# Patient Record
Sex: Male | Born: 1940
Health system: Southern US, Community
[De-identification: ages and names within clinical notes are randomized; demographics above are authoritative.]

## PROBLEM LIST (undated history)

## (undated) DIAGNOSIS — J189 Pneumonia, unspecified organism: Secondary | ICD-10-CM

## (undated) DIAGNOSIS — R51 Headache: Secondary | ICD-10-CM

## (undated) DIAGNOSIS — E785 Hyperlipidemia, unspecified: Secondary | ICD-10-CM

## (undated) DIAGNOSIS — G4733 Obstructive sleep apnea (adult) (pediatric): Secondary | ICD-10-CM

## (undated) DIAGNOSIS — F329 Major depressive disorder, single episode, unspecified: Secondary | ICD-10-CM

## (undated) DIAGNOSIS — M545 Low back pain, unspecified: Secondary | ICD-10-CM

## (undated) DIAGNOSIS — K219 Gastro-esophageal reflux disease without esophagitis: Secondary | ICD-10-CM

## (undated) DIAGNOSIS — F419 Anxiety disorder, unspecified: Secondary | ICD-10-CM

## (undated) DIAGNOSIS — K279 Peptic ulcer, site unspecified, unspecified as acute or chronic, without hemorrhage or perforation: Secondary | ICD-10-CM

## (undated) DIAGNOSIS — N401 Enlarged prostate with lower urinary tract symptoms: Secondary | ICD-10-CM

## (undated) DIAGNOSIS — D649 Anemia, unspecified: Secondary | ICD-10-CM

## (undated) DIAGNOSIS — M1990 Unspecified osteoarthritis, unspecified site: Secondary | ICD-10-CM

## (undated) DIAGNOSIS — N529 Male erectile dysfunction, unspecified: Secondary | ICD-10-CM

## (undated) DIAGNOSIS — G9009 Other idiopathic peripheral autonomic neuropathy: Secondary | ICD-10-CM

## (undated) DIAGNOSIS — S7292XA Unspecified fracture of left femur, initial encounter for closed fracture: Secondary | ICD-10-CM

## (undated) DIAGNOSIS — N138 Other obstructive and reflux uropathy: Secondary | ICD-10-CM

## (undated) DIAGNOSIS — Z01810 Encounter for preprocedural cardiovascular examination: Secondary | ICD-10-CM

## (undated) DIAGNOSIS — I219 Acute myocardial infarction, unspecified: Secondary | ICD-10-CM

## (undated) DIAGNOSIS — Z8249 Family history of ischemic heart disease and other diseases of the circulatory system: Secondary | ICD-10-CM

## (undated) DIAGNOSIS — I499 Cardiac arrhythmia, unspecified: Secondary | ICD-10-CM

## (undated) DIAGNOSIS — R001 Bradycardia, unspecified: Secondary | ICD-10-CM

## (undated) DIAGNOSIS — F32A Depression, unspecified: Secondary | ICD-10-CM

## (undated) DIAGNOSIS — R519 Headache, unspecified: Secondary | ICD-10-CM

## (undated) DIAGNOSIS — I672 Cerebral atherosclerosis: Secondary | ICD-10-CM

## (undated) DIAGNOSIS — I2510 Atherosclerotic heart disease of native coronary artery without angina pectoris: Secondary | ICD-10-CM

## (undated) DIAGNOSIS — M48061 Spinal stenosis, lumbar region without neurogenic claudication: Secondary | ICD-10-CM

## (undated) DIAGNOSIS — I10 Essential (primary) hypertension: Secondary | ICD-10-CM

## (undated) DIAGNOSIS — Z0000 Encounter for general adult medical examination without abnormal findings: Secondary | ICD-10-CM

## (undated) DIAGNOSIS — Z5189 Encounter for other specified aftercare: Secondary | ICD-10-CM

## (undated) HISTORY — DX: Anxiety disorder, unspecified: F41.9

## (undated) HISTORY — DX: Family history of ischemic heart disease and other diseases of the circulatory system: Z82.49

## (undated) HISTORY — DX: Low back pain, unspecified: M54.50

## (undated) HISTORY — DX: Cerebral atherosclerosis: I67.2

## (undated) HISTORY — DX: Pneumonia, unspecified organism: J18.9

## (undated) HISTORY — DX: Male erectile dysfunction, unspecified: N52.9

## (undated) HISTORY — DX: Anemia, unspecified: D64.9

## (undated) HISTORY — DX: Atherosclerotic heart disease of native coronary artery without angina pectoris: I25.10

## (undated) HISTORY — DX: Low back pain: M54.5

## (undated) HISTORY — DX: Encounter for preprocedural cardiovascular examination: Z01.810

## (undated) HISTORY — DX: Headache, unspecified: R51.9

## (undated) HISTORY — DX: Other obstructive and reflux uropathy: N13.8

## (undated) HISTORY — DX: Headache: R51

## (undated) HISTORY — DX: Encounter for general adult medical examination without abnormal findings: Z00.00

## (undated) HISTORY — DX: Depression, unspecified: F32.A

## (undated) HISTORY — DX: Encounter for other specified aftercare: Z51.89

## (undated) HISTORY — PX: FRACTURE SURGERY: SHX138

## (undated) HISTORY — DX: Hyperlipidemia, unspecified: E78.5

## (undated) HISTORY — DX: Spinal stenosis, lumbar region without neurogenic claudication: M48.061

## (undated) HISTORY — DX: Acute myocardial infarction, unspecified: I21.9

## (undated) HISTORY — DX: Obstructive sleep apnea (adult) (pediatric): G47.33

## (undated) HISTORY — DX: Peptic ulcer, site unspecified, unspecified as acute or chronic, without hemorrhage or perforation: K27.9

## (undated) HISTORY — DX: Benign prostatic hyperplasia with lower urinary tract symptoms: N40.1

## (undated) HISTORY — DX: Major depressive disorder, single episode, unspecified: F32.9

## (undated) HISTORY — PX: BACK SURGERY: SHX140

## (undated) HISTORY — DX: Other idiopathic peripheral autonomic neuropathy: G90.09

## (undated) HISTORY — DX: Unspecified osteoarthritis, unspecified site: M19.90

## (undated) HISTORY — DX: Essential (primary) hypertension: I10

---

## 1995-02-19 DIAGNOSIS — I219 Acute myocardial infarction, unspecified: Secondary | ICD-10-CM

## 1995-02-19 HISTORY — DX: Acute myocardial infarction, unspecified: I21.9

## 1996-02-19 DIAGNOSIS — I2510 Atherosclerotic heart disease of native coronary artery without angina pectoris: Secondary | ICD-10-CM

## 1996-02-19 HISTORY — PX: CORONARY ANGIOPLASTY WITH STENT PLACEMENT: SHX49

## 1996-02-19 HISTORY — DX: Atherosclerotic heart disease of native coronary artery without angina pectoris: I25.10

## 1998-04-28 ENCOUNTER — Ambulatory Visit (HOSPITAL_COMMUNITY): Admission: RE | Admit: 1998-04-28 | Discharge: 1998-04-28 | Payer: Self-pay | Admitting: Internal Medicine

## 1998-04-28 ENCOUNTER — Encounter: Payer: Self-pay | Admitting: Internal Medicine

## 1998-06-13 ENCOUNTER — Encounter: Payer: Self-pay | Admitting: Neurosurgery

## 1998-06-16 ENCOUNTER — Encounter: Payer: Self-pay | Admitting: Neurosurgery

## 1998-06-16 ENCOUNTER — Ambulatory Visit (HOSPITAL_COMMUNITY): Admission: RE | Admit: 1998-06-16 | Discharge: 1998-06-16 | Payer: Self-pay | Admitting: Neurosurgery

## 1998-08-30 ENCOUNTER — Ambulatory Visit (HOSPITAL_BASED_OUTPATIENT_CLINIC_OR_DEPARTMENT_OTHER): Admission: RE | Admit: 1998-08-30 | Discharge: 1998-08-30 | Payer: Self-pay | Admitting: General Surgery

## 1999-03-22 HISTORY — PX: REVISION TOTAL KNEE ARTHROPLASTY: SUR1280

## 1999-04-03 ENCOUNTER — Inpatient Hospital Stay (HOSPITAL_COMMUNITY): Admission: RE | Admit: 1999-04-03 | Discharge: 1999-04-06 | Payer: Self-pay | Admitting: Orthopedic Surgery

## 1999-04-03 ENCOUNTER — Encounter: Payer: Self-pay | Admitting: Orthopedic Surgery

## 1999-07-05 ENCOUNTER — Ambulatory Visit (HOSPITAL_COMMUNITY): Admission: RE | Admit: 1999-07-05 | Discharge: 1999-07-05 | Payer: Self-pay | Admitting: Neurosurgery

## 1999-08-14 ENCOUNTER — Ambulatory Visit (HOSPITAL_COMMUNITY): Admission: RE | Admit: 1999-08-14 | Discharge: 1999-08-14 | Payer: Self-pay | Admitting: Neurosurgery

## 1999-08-14 ENCOUNTER — Encounter: Payer: Self-pay | Admitting: Neurosurgery

## 1999-08-27 ENCOUNTER — Ambulatory Visit (HOSPITAL_COMMUNITY): Admission: RE | Admit: 1999-08-27 | Discharge: 1999-08-27 | Payer: Self-pay | Admitting: Neurosurgery

## 1999-08-27 ENCOUNTER — Encounter: Payer: Self-pay | Admitting: Neurosurgery

## 1999-09-10 ENCOUNTER — Ambulatory Visit (HOSPITAL_COMMUNITY): Admission: RE | Admit: 1999-09-10 | Discharge: 1999-09-10 | Payer: Self-pay | Admitting: Neurosurgery

## 1999-09-10 ENCOUNTER — Encounter: Payer: Self-pay | Admitting: Neurosurgery

## 2000-12-16 ENCOUNTER — Ambulatory Visit (HOSPITAL_BASED_OUTPATIENT_CLINIC_OR_DEPARTMENT_OTHER): Admission: RE | Admit: 2000-12-16 | Discharge: 2000-12-16 | Payer: Self-pay | Admitting: Otolaryngology

## 2000-12-16 ENCOUNTER — Encounter: Payer: Self-pay | Admitting: Internal Medicine

## 2001-05-11 ENCOUNTER — Encounter: Payer: Self-pay | Admitting: Orthopedic Surgery

## 2001-05-11 ENCOUNTER — Encounter: Admission: RE | Admit: 2001-05-11 | Discharge: 2001-05-11 | Payer: Self-pay | Admitting: Orthopedic Surgery

## 2001-06-18 ENCOUNTER — Encounter: Payer: Self-pay | Admitting: Orthopedic Surgery

## 2001-06-18 ENCOUNTER — Inpatient Hospital Stay (HOSPITAL_COMMUNITY): Admission: RE | Admit: 2001-06-18 | Discharge: 2001-06-19 | Payer: Self-pay | Admitting: Orthopedic Surgery

## 2001-06-18 HISTORY — PX: LUMBAR LAMINECTOMY: SHX95

## 2001-07-01 ENCOUNTER — Emergency Department (HOSPITAL_COMMUNITY): Admission: EM | Admit: 2001-07-01 | Discharge: 2001-07-01 | Payer: Self-pay | Admitting: *Deleted

## 2002-02-18 HISTORY — PX: ESOPHAGOGASTRODUODENOSCOPY: SHX1529

## 2002-02-18 HISTORY — PX: COLONOSCOPY: SHX174

## 2002-08-19 HISTORY — PX: CARDIAC CATHETERIZATION: SHX172

## 2002-09-02 ENCOUNTER — Encounter: Payer: Self-pay | Admitting: Emergency Medicine

## 2002-09-02 ENCOUNTER — Inpatient Hospital Stay (HOSPITAL_COMMUNITY): Admission: EM | Admit: 2002-09-02 | Discharge: 2002-09-04 | Payer: Self-pay | Admitting: Emergency Medicine

## 2002-10-26 ENCOUNTER — Encounter: Payer: Self-pay | Admitting: Gastroenterology

## 2002-10-26 ENCOUNTER — Ambulatory Visit (HOSPITAL_COMMUNITY): Admission: RE | Admit: 2002-10-26 | Discharge: 2002-10-26 | Payer: Self-pay | Admitting: Gastroenterology

## 2003-02-19 DIAGNOSIS — J189 Pneumonia, unspecified organism: Secondary | ICD-10-CM

## 2003-02-19 HISTORY — DX: Pneumonia, unspecified organism: J18.9

## 2003-08-19 HISTORY — PX: CARPAL TUNNEL RELEASE: SHX101

## 2003-08-25 ENCOUNTER — Ambulatory Visit (HOSPITAL_BASED_OUTPATIENT_CLINIC_OR_DEPARTMENT_OTHER): Admission: RE | Admit: 2003-08-25 | Discharge: 2003-08-25 | Payer: Self-pay | Admitting: Orthopedic Surgery

## 2003-12-07 ENCOUNTER — Encounter: Payer: Self-pay | Admitting: Internal Medicine

## 2003-12-20 HISTORY — PX: CARPAL TUNNEL RELEASE: SHX101

## 2003-12-21 ENCOUNTER — Ambulatory Visit (HOSPITAL_COMMUNITY): Admission: RE | Admit: 2003-12-21 | Discharge: 2003-12-21 | Payer: Self-pay | Admitting: Orthopedic Surgery

## 2004-05-28 ENCOUNTER — Ambulatory Visit: Payer: Self-pay | Admitting: Internal Medicine

## 2004-05-31 ENCOUNTER — Ambulatory Visit: Payer: Self-pay | Admitting: Internal Medicine

## 2004-06-06 ENCOUNTER — Ambulatory Visit: Payer: Self-pay | Admitting: Internal Medicine

## 2004-06-18 ENCOUNTER — Ambulatory Visit: Payer: Self-pay | Admitting: Internal Medicine

## 2004-08-28 ENCOUNTER — Ambulatory Visit: Payer: Self-pay | Admitting: Internal Medicine

## 2004-09-03 ENCOUNTER — Ambulatory Visit: Payer: Self-pay | Admitting: Internal Medicine

## 2004-10-21 ENCOUNTER — Emergency Department (HOSPITAL_COMMUNITY): Admission: EM | Admit: 2004-10-21 | Discharge: 2004-10-21 | Payer: Self-pay | Admitting: Family Medicine

## 2004-10-24 ENCOUNTER — Ambulatory Visit: Payer: Self-pay | Admitting: Internal Medicine

## 2004-11-26 ENCOUNTER — Ambulatory Visit: Payer: Self-pay | Admitting: Internal Medicine

## 2004-11-28 ENCOUNTER — Ambulatory Visit: Payer: Self-pay | Admitting: Cardiology

## 2004-11-30 ENCOUNTER — Ambulatory Visit: Payer: Self-pay | Admitting: Internal Medicine

## 2004-12-13 ENCOUNTER — Ambulatory Visit: Payer: Self-pay | Admitting: Internal Medicine

## 2004-12-31 ENCOUNTER — Ambulatory Visit: Payer: Self-pay | Admitting: Internal Medicine

## 2005-02-12 ENCOUNTER — Ambulatory Visit: Payer: Self-pay | Admitting: Internal Medicine

## 2005-04-18 ENCOUNTER — Ambulatory Visit: Payer: Self-pay | Admitting: Internal Medicine

## 2005-05-29 ENCOUNTER — Encounter (INDEPENDENT_AMBULATORY_CARE_PROVIDER_SITE_OTHER): Payer: Self-pay | Admitting: *Deleted

## 2005-05-29 ENCOUNTER — Ambulatory Visit (HOSPITAL_COMMUNITY): Admission: RE | Admit: 2005-05-29 | Discharge: 2005-05-29 | Payer: Self-pay | Admitting: Specialist

## 2005-06-18 HISTORY — PX: KNEE ARTHROSCOPY: SHX127

## 2005-07-04 ENCOUNTER — Ambulatory Visit (HOSPITAL_BASED_OUTPATIENT_CLINIC_OR_DEPARTMENT_OTHER): Admission: RE | Admit: 2005-07-04 | Discharge: 2005-07-04 | Payer: Self-pay | Admitting: Orthopedic Surgery

## 2005-08-10 ENCOUNTER — Encounter: Admission: RE | Admit: 2005-08-10 | Discharge: 2005-08-10 | Payer: Self-pay | Admitting: Family Medicine

## 2005-12-03 ENCOUNTER — Ambulatory Visit: Payer: Self-pay | Admitting: Cardiology

## 2006-01-18 HISTORY — PX: LUMBAR LAMINECTOMY/DECOMPRESSION MICRODISCECTOMY: SHX5026

## 2006-01-28 ENCOUNTER — Encounter: Admission: RE | Admit: 2006-01-28 | Discharge: 2006-01-28 | Payer: Self-pay | Admitting: Orthopedic Surgery

## 2006-02-12 ENCOUNTER — Ambulatory Visit (HOSPITAL_COMMUNITY): Admission: RE | Admit: 2006-02-12 | Discharge: 2006-02-13 | Payer: Self-pay | Admitting: Orthopedic Surgery

## 2006-06-16 ENCOUNTER — Ambulatory Visit: Payer: Self-pay | Admitting: Internal Medicine

## 2006-06-16 LAB — CONVERTED CEMR LAB
PSA, Free Pct: 59 (ref >25)
PSA, Free: 0.2 ng/mL

## 2006-06-17 LAB — CONVERTED CEMR LAB
Basophils Relative: 0.5 % (ref 0.0–1.0)
Eosinophils Relative: 4 % (ref 0.0–5.0)
HCT: 35.9 % — ABNORMAL LOW (ref 39.0–52.0)
Hemoglobin: 12.1 g/dL — ABNORMAL LOW (ref 13.0–17.0)
Lymphocytes Relative: 30.6 % (ref 12.0–46.0)
Monocytes Absolute: 0.3 10*3/uL (ref 0.2–0.7)
Neutro Abs: 3.7 10*3/uL (ref 1.4–7.7)
Neutrophils Relative %: 59.7 % (ref 43.0–77.0)
Platelets: 292 10*3/uL (ref 150–400)
RBC: 4.42 M/uL (ref 4.22–5.81)

## 2006-08-18 ENCOUNTER — Ambulatory Visit: Payer: Self-pay | Admitting: Internal Medicine

## 2006-08-18 LAB — CONVERTED CEMR LAB
Basophils Absolute: 0.1 10*3/uL (ref 0.0–0.1)
Basophils Relative: 0.9 % (ref 0.0–1.0)
HCT: 39.1 % (ref 39.0–52.0)
Monocytes Relative: 7 % (ref 3.0–11.0)
Neutro Abs: 3.3 10*3/uL (ref 1.4–7.7)
Neutrophils Relative %: 55.8 % (ref 43.0–77.0)
Platelets: 208 10*3/uL (ref 150–400)
RDW: 18.2 % — ABNORMAL HIGH (ref 11.5–14.6)
WBC: 6 10*3/uL (ref 4.5–10.5)

## 2006-08-25 ENCOUNTER — Encounter: Payer: Self-pay | Admitting: Internal Medicine

## 2006-08-25 ENCOUNTER — Ambulatory Visit: Payer: Self-pay

## 2006-09-03 DIAGNOSIS — R51 Headache: Secondary | ICD-10-CM

## 2006-09-03 DIAGNOSIS — R519 Headache, unspecified: Secondary | ICD-10-CM | POA: Insufficient documentation

## 2006-09-09 ENCOUNTER — Telehealth: Payer: Self-pay | Admitting: *Deleted

## 2006-09-11 ENCOUNTER — Telehealth: Payer: Self-pay | Admitting: *Deleted

## 2006-09-11 ENCOUNTER — Encounter: Payer: Self-pay | Admitting: Internal Medicine

## 2006-10-13 ENCOUNTER — Telehealth: Payer: Self-pay | Admitting: Internal Medicine

## 2006-11-13 ENCOUNTER — Telehealth: Payer: Self-pay | Admitting: *Deleted

## 2006-11-24 ENCOUNTER — Ambulatory Visit: Payer: Self-pay | Admitting: Internal Medicine

## 2006-11-24 DIAGNOSIS — I10 Essential (primary) hypertension: Secondary | ICD-10-CM | POA: Insufficient documentation

## 2006-11-26 ENCOUNTER — Encounter: Payer: Self-pay | Admitting: Internal Medicine

## 2006-12-15 ENCOUNTER — Telehealth (INDEPENDENT_AMBULATORY_CARE_PROVIDER_SITE_OTHER): Payer: Self-pay | Admitting: *Deleted

## 2006-12-16 ENCOUNTER — Ambulatory Visit: Payer: Self-pay | Admitting: Internal Medicine

## 2006-12-16 DIAGNOSIS — G4733 Obstructive sleep apnea (adult) (pediatric): Secondary | ICD-10-CM

## 2006-12-30 ENCOUNTER — Telehealth: Payer: Self-pay | Admitting: Internal Medicine

## 2007-01-06 ENCOUNTER — Ambulatory Visit: Payer: Self-pay | Admitting: Family Medicine

## 2007-01-06 ENCOUNTER — Telehealth: Payer: Self-pay | Admitting: Internal Medicine

## 2007-02-10 ENCOUNTER — Telehealth: Payer: Self-pay | Admitting: Internal Medicine

## 2007-02-26 ENCOUNTER — Telehealth: Payer: Self-pay | Admitting: Internal Medicine

## 2007-03-05 ENCOUNTER — Encounter: Payer: Self-pay | Admitting: Internal Medicine

## 2007-03-18 ENCOUNTER — Encounter: Payer: Self-pay | Admitting: Internal Medicine

## 2007-03-20 ENCOUNTER — Ambulatory Visit: Payer: Self-pay | Admitting: Internal Medicine

## 2007-03-20 ENCOUNTER — Telehealth: Payer: Self-pay | Admitting: Internal Medicine

## 2007-03-23 ENCOUNTER — Telehealth: Payer: Self-pay | Admitting: Internal Medicine

## 2007-04-01 ENCOUNTER — Encounter: Payer: Self-pay | Admitting: Internal Medicine

## 2007-04-13 ENCOUNTER — Telehealth (INDEPENDENT_AMBULATORY_CARE_PROVIDER_SITE_OTHER): Payer: Self-pay | Admitting: *Deleted

## 2007-05-15 ENCOUNTER — Telehealth: Payer: Self-pay | Admitting: Internal Medicine

## 2007-06-19 ENCOUNTER — Ambulatory Visit: Payer: Self-pay | Admitting: Internal Medicine

## 2007-06-19 LAB — CONVERTED CEMR LAB
Cholesterol, target level: 200 mg/dL
HDL goal, serum: 40 mg/dL
LDL Goal: 100 mg/dL

## 2007-07-07 ENCOUNTER — Telehealth: Payer: Self-pay | Admitting: Internal Medicine

## 2007-07-10 ENCOUNTER — Telehealth: Payer: Self-pay | Admitting: Internal Medicine

## 2007-09-18 ENCOUNTER — Ambulatory Visit: Payer: Self-pay | Admitting: Internal Medicine

## 2007-09-18 LAB — CONVERTED CEMR LAB
ALT: 16 units/L (ref 0–53)
AST: 23 units/L (ref 0–37)
Albumin: 3.7 g/dL (ref 3.5–5.2)
Alkaline Phosphatase: 100 units/L (ref 39–117)
Bilirubin Urine: NEGATIVE
CO2: 27 meq/L (ref 19–32)
Calcium: 9.4 mg/dL (ref 8.4–10.5)
Chloride: 107 meq/L (ref 96–112)
GFR calc Af Amer: 77 mL/min
Glucose, Bld: 95 mg/dL (ref 70–99)
HCT: 43.1 % (ref 39.0–52.0)
Hemoglobin: 15.4 g/dL (ref 13.0–17.0)
Lymphocytes Relative: 25.3 % (ref 12.0–46.0)
MCHC: 35.6 g/dL (ref 30.0–36.0)
Neutrophils Relative %: 63.6 % (ref 43.0–77.0)
PSA: 0.19 ng/mL (ref 0.10–4.00)
Platelets: 207 10*3/uL (ref 150–400)
RBC: 4.5 M/uL (ref 4.22–5.81)
RDW: 12.6 % (ref 11.5–14.6)
Sodium: 140 meq/L (ref 135–145)
TSH: 0.6 microintl units/mL (ref 0.35–5.50)
Urobilinogen, UA: 0.2
VLDL: 52 mg/dL — ABNORMAL HIGH (ref 0–40)
WBC: 7.6 10*3/uL (ref 4.5–10.5)
pH: 6.5

## 2007-09-25 ENCOUNTER — Ambulatory Visit: Payer: Self-pay | Admitting: Internal Medicine

## 2007-10-06 ENCOUNTER — Telehealth: Payer: Self-pay | Admitting: Internal Medicine

## 2007-10-22 ENCOUNTER — Telehealth: Payer: Self-pay | Admitting: Internal Medicine

## 2007-11-19 HISTORY — PX: SHOULDER ARTHROSCOPY WITH ROTATOR CUFF REPAIR AND SUBACROMIAL DECOMPRESSION: SHX5686

## 2007-12-14 ENCOUNTER — Ambulatory Visit (HOSPITAL_BASED_OUTPATIENT_CLINIC_OR_DEPARTMENT_OTHER): Admission: RE | Admit: 2007-12-14 | Discharge: 2007-12-15 | Payer: Self-pay | Admitting: Orthopedic Surgery

## 2008-01-06 ENCOUNTER — Telehealth: Payer: Self-pay | Admitting: Internal Medicine

## 2008-01-25 ENCOUNTER — Telehealth: Payer: Self-pay | Admitting: Internal Medicine

## 2008-03-28 ENCOUNTER — Ambulatory Visit: Payer: Self-pay | Admitting: Internal Medicine

## 2008-03-28 LAB — CONVERTED CEMR LAB
Albumin: 3.8 g/dL (ref 3.5–5.2)
Alkaline Phosphatase: 90 units/L (ref 39–117)
Bilirubin, Direct: 0.1 mg/dL (ref 0.0–0.3)
Cholesterol: 134 mg/dL (ref 0–200)
Direct LDL: 73.5 mg/dL
HDL: 25.4 mg/dL — ABNORMAL LOW (ref >39.0)
Total CHOL/HDL Ratio: 5.3
Triglycerides: 228 mg/dL (ref 0–149)

## 2008-04-04 ENCOUNTER — Telehealth: Payer: Self-pay | Admitting: *Deleted

## 2008-04-15 ENCOUNTER — Telehealth: Payer: Self-pay | Admitting: *Deleted

## 2008-04-19 ENCOUNTER — Encounter: Payer: Self-pay | Admitting: Internal Medicine

## 2008-04-22 ENCOUNTER — Telehealth (INDEPENDENT_AMBULATORY_CARE_PROVIDER_SITE_OTHER): Payer: Self-pay | Admitting: *Deleted

## 2008-04-25 ENCOUNTER — Ambulatory Visit: Payer: Self-pay | Admitting: Internal Medicine

## 2008-05-26 ENCOUNTER — Encounter: Payer: Self-pay | Admitting: Internal Medicine

## 2008-06-06 ENCOUNTER — Ambulatory Visit: Payer: Self-pay | Admitting: Internal Medicine

## 2008-06-07 ENCOUNTER — Ambulatory Visit: Payer: Self-pay | Admitting: Cardiology

## 2008-06-07 ENCOUNTER — Encounter: Payer: Self-pay | Admitting: Cardiology

## 2008-06-07 ENCOUNTER — Telehealth: Payer: Self-pay | Admitting: Internal Medicine

## 2008-06-08 ENCOUNTER — Ambulatory Visit: Payer: Self-pay | Admitting: Internal Medicine

## 2008-06-08 LAB — CONVERTED CEMR LAB: Creatinine, Ser: 1.2 mg/dL (ref 0.4–1.5)

## 2008-06-09 ENCOUNTER — Telehealth: Payer: Self-pay | Admitting: Internal Medicine

## 2008-06-10 ENCOUNTER — Encounter: Admission: RE | Admit: 2008-06-10 | Discharge: 2008-06-10 | Payer: Self-pay | Admitting: Internal Medicine

## 2008-06-20 ENCOUNTER — Telehealth (INDEPENDENT_AMBULATORY_CARE_PROVIDER_SITE_OTHER): Payer: Self-pay | Admitting: *Deleted

## 2008-06-21 ENCOUNTER — Ambulatory Visit: Payer: Self-pay

## 2008-06-21 ENCOUNTER — Telehealth: Payer: Self-pay | Admitting: Internal Medicine

## 2008-06-21 ENCOUNTER — Encounter: Payer: Self-pay | Admitting: Cardiology

## 2008-06-29 ENCOUNTER — Encounter: Payer: Self-pay | Admitting: Internal Medicine

## 2008-06-30 ENCOUNTER — Telehealth: Payer: Self-pay | Admitting: Internal Medicine

## 2008-07-04 ENCOUNTER — Ambulatory Visit: Payer: Self-pay | Admitting: Cardiology

## 2008-07-04 ENCOUNTER — Encounter: Payer: Self-pay | Admitting: Internal Medicine

## 2008-07-04 LAB — CONVERTED CEMR LAB
Basophils Relative: 0.4 % (ref 0.0–3.0)
Calcium: 9.2 mg/dL (ref 8.4–10.5)
Chloride: 108 meq/L (ref 96–112)
Creatinine, Ser: 1.1 mg/dL (ref 0.4–1.5)
Hemoglobin: 14 g/dL (ref 13.0–17.0)
Lymphocytes Relative: 22.5 % (ref 12.0–46.0)
MCV: 97.8 fL (ref 78.0–100.0)
Monocytes Absolute: 0.5 10*3/uL (ref 0.1–1.0)
Monocytes Relative: 5 % (ref 3.0–12.0)
Neutrophils Relative %: 69.2 % (ref 43.0–77.0)
Platelets: 178 10*3/uL (ref 150.0–400.0)
RDW: 13.1 % (ref 11.5–14.6)
aPTT: 27.7 s (ref 21.7–28.8)

## 2008-07-06 ENCOUNTER — Telehealth: Payer: Self-pay | Admitting: Cardiology

## 2008-07-08 ENCOUNTER — Ambulatory Visit: Payer: Self-pay | Admitting: Cardiology

## 2008-07-08 ENCOUNTER — Inpatient Hospital Stay (HOSPITAL_BASED_OUTPATIENT_CLINIC_OR_DEPARTMENT_OTHER): Admission: RE | Admit: 2008-07-08 | Discharge: 2008-07-08 | Payer: Self-pay | Admitting: Cardiology

## 2008-07-11 ENCOUNTER — Telehealth: Payer: Self-pay | Admitting: Cardiology

## 2008-07-26 ENCOUNTER — Ambulatory Visit: Payer: Self-pay | Admitting: Cardiology

## 2008-08-01 ENCOUNTER — Telehealth: Payer: Self-pay | Admitting: Internal Medicine

## 2008-08-16 ENCOUNTER — Encounter: Payer: Self-pay | Admitting: Cardiology

## 2008-09-10 ENCOUNTER — Encounter: Admission: RE | Admit: 2008-09-10 | Discharge: 2008-09-10 | Payer: Self-pay | Admitting: Neurology

## 2008-09-28 ENCOUNTER — Telehealth: Payer: Self-pay | Admitting: Internal Medicine

## 2008-12-19 ENCOUNTER — Ambulatory Visit: Payer: Self-pay | Admitting: Internal Medicine

## 2008-12-19 LAB — CONVERTED CEMR LAB: PSA: 0.22 ng/mL (ref 0.10–4.00)

## 2009-01-05 ENCOUNTER — Encounter: Admission: RE | Admit: 2009-01-05 | Discharge: 2009-01-05 | Payer: Self-pay | Admitting: Neurology

## 2009-01-19 ENCOUNTER — Ambulatory Visit: Payer: Self-pay | Admitting: Cardiology

## 2009-02-18 HISTORY — PX: JOINT REPLACEMENT: SHX530

## 2009-02-20 ENCOUNTER — Ambulatory Visit: Payer: Self-pay | Admitting: Internal Medicine

## 2009-03-01 ENCOUNTER — Encounter: Payer: Self-pay | Admitting: Internal Medicine

## 2009-03-02 ENCOUNTER — Telehealth: Payer: Self-pay | Admitting: Internal Medicine

## 2009-03-02 ENCOUNTER — Emergency Department (HOSPITAL_COMMUNITY): Admission: EM | Admit: 2009-03-02 | Discharge: 2009-03-02 | Payer: Self-pay | Admitting: Emergency Medicine

## 2009-03-17 ENCOUNTER — Encounter: Payer: Self-pay | Admitting: Internal Medicine

## 2009-03-23 ENCOUNTER — Ambulatory Visit: Payer: Self-pay | Admitting: Internal Medicine

## 2009-04-28 ENCOUNTER — Ambulatory Visit: Admission: RE | Admit: 2009-04-28 | Discharge: 2009-04-28 | Payer: Self-pay | Admitting: Orthopedic Surgery

## 2009-04-28 ENCOUNTER — Encounter (INDEPENDENT_AMBULATORY_CARE_PROVIDER_SITE_OTHER): Payer: Self-pay | Admitting: Orthopedic Surgery

## 2009-04-28 ENCOUNTER — Ambulatory Visit: Payer: Self-pay | Admitting: Vascular Surgery

## 2009-05-29 ENCOUNTER — Ambulatory Visit: Payer: Self-pay | Admitting: Internal Medicine

## 2009-05-29 LAB — CONVERTED CEMR LAB
BUN: 19 mg/dL (ref 6–23)
CO2: 31 meq/L (ref 19–32)
Chloride: 105 meq/L (ref 96–112)
Cholesterol: 119 mg/dL (ref 0–200)
GFR calc non Af Amer: 57.94 mL/min (ref >60)
Glucose, Bld: 97 mg/dL (ref 70–99)
Potassium: 4.3 meq/L (ref 3.5–5.1)
TSH: 0.66 microintl units/mL (ref 0.35–5.50)

## 2009-05-30 ENCOUNTER — Telehealth: Payer: Self-pay | Admitting: Internal Medicine

## 2009-05-30 ENCOUNTER — Telehealth: Payer: Self-pay | Admitting: *Deleted

## 2009-06-15 ENCOUNTER — Telehealth: Payer: Self-pay | Admitting: Internal Medicine

## 2009-09-05 ENCOUNTER — Telehealth: Payer: Self-pay | Admitting: Internal Medicine

## 2009-09-05 ENCOUNTER — Encounter: Payer: Self-pay | Admitting: Cardiovascular Disease

## 2009-09-05 ENCOUNTER — Ambulatory Visit: Payer: Self-pay | Admitting: Family Medicine

## 2009-09-05 ENCOUNTER — Encounter: Payer: Self-pay | Admitting: Family Medicine

## 2009-09-05 ENCOUNTER — Ambulatory Visit: Payer: Self-pay

## 2009-09-08 ENCOUNTER — Telehealth: Payer: Self-pay | Admitting: Internal Medicine

## 2009-09-13 ENCOUNTER — Telehealth: Payer: Self-pay | Admitting: Internal Medicine

## 2009-09-29 ENCOUNTER — Ambulatory Visit: Payer: Self-pay | Admitting: Internal Medicine

## 2009-10-19 ENCOUNTER — Inpatient Hospital Stay (HOSPITAL_COMMUNITY): Admission: RE | Admit: 2009-10-19 | Discharge: 2009-10-25 | Payer: Self-pay | Admitting: Orthopedic Surgery

## 2009-10-19 HISTORY — PX: REVISION TOTAL KNEE ARTHROPLASTY: SUR1280

## 2009-10-26 ENCOUNTER — Encounter: Payer: Self-pay | Admitting: Internal Medicine

## 2009-10-27 ENCOUNTER — Telehealth: Payer: Self-pay | Admitting: Internal Medicine

## 2009-11-03 ENCOUNTER — Encounter: Payer: Self-pay | Admitting: Internal Medicine

## 2009-12-01 ENCOUNTER — Ambulatory Visit: Payer: Self-pay | Admitting: Internal Medicine

## 2009-12-02 ENCOUNTER — Emergency Department (HOSPITAL_COMMUNITY): Admission: EM | Admit: 2009-12-02 | Discharge: 2009-12-02 | Payer: Self-pay | Admitting: Emergency Medicine

## 2009-12-05 ENCOUNTER — Telehealth: Payer: Self-pay | Admitting: Internal Medicine

## 2009-12-26 ENCOUNTER — Telehealth: Payer: Self-pay | Admitting: Internal Medicine

## 2010-01-10 ENCOUNTER — Telehealth: Payer: Self-pay | Admitting: Internal Medicine

## 2010-01-17 ENCOUNTER — Telehealth: Payer: Self-pay | Admitting: Internal Medicine

## 2010-01-18 ENCOUNTER — Telehealth: Payer: Self-pay | Admitting: Internal Medicine

## 2010-01-19 ENCOUNTER — Ambulatory Visit: Payer: Self-pay | Admitting: Cardiology

## 2010-01-19 ENCOUNTER — Encounter: Payer: Self-pay | Admitting: Cardiology

## 2010-02-13 ENCOUNTER — Telehealth: Payer: Self-pay | Admitting: Internal Medicine

## 2010-03-06 ENCOUNTER — Encounter: Payer: Self-pay | Admitting: *Deleted

## 2010-03-13 ENCOUNTER — Telehealth: Payer: Self-pay | Admitting: Internal Medicine

## 2010-03-17 ENCOUNTER — Encounter: Payer: Self-pay | Admitting: *Deleted

## 2010-03-20 NOTE — Progress Notes (Signed)
Summary: Pts spouse called re: bleeding of biopsy site  Phone Note Call from Patient Call back at [phone] [place] cell  or  [phone] [facility] cell    Caller: Spouse-[name] Summary of Call: Pts spouse called and said that incision site where the biopsy was done is still bleeding pretty badly. [place], Dr. Lovell Sheehan nurse said  for pt to put two of the 4x4 bandages on site really tight and put another bandage over the top of that. Pt instructed to change out bandages when bandages wet. Bleeding will stop eventually.    Initial call taken by: Lucy Antigua,  May 30, 2009 3:03 PM  Follow-up for Phone Call        talked with wife and she put back brace over side giving it support- she was told if it continues to bleed to bring him around 4:30 and dr Lovell Sheehan would look at it today Follow-up by: Willy Eddy, LPN,  May 30, 2009 3:16 PM

## 2010-03-20 NOTE — Miscellaneous (Signed)
Summary: Orders Update  Clinical Lists Changes  Orders: Added new Test order of Venous Duplex Lower Extremity (Venous Duplex Lower) - Signed 

## 2010-03-20 NOTE — Progress Notes (Signed)
Summary: handicapped for truck & car  Phone Note Call from Patient Call back at Home Phone [phone] Call back at [phone] c   Caller: wife,linda Reason for Call: Acute Illness Summary of Call: Requesting handicapped sticker, green, etc., one for the truck & one for the car, just had 3rd knee replacement on same knee & is in rehab indefinitely, but she hopes he'll be released soon.   Initial call taken by: Rudy Jew, RN,  October 27, 2009 3:02 PM  Follow-up for Phone Call        family has to go to license bureau(where you get tags) and get handicapped form and we will be glad to sign for him--we do not have the new forms Follow-up by: Willy Eddy, LPN,  October 31, 2009 8:00 AM  Additional Follow-up for Phone Call Additional follow up Details #1::        I called and spoke to pts wife Bonita Quin) and explained the process... pt will need to go by license bureau to obtain the form and bring it to office to be signed off / completed.  Additional Follow-up by: Debbra Riding,  November 01, 2009 8:04 AM

## 2010-03-20 NOTE — Letter (Signed)
Summary: [facility]  [facility]   Imported By: Maryln Gottron 04/05/2009 12:21:35  _____________________________________________________________________  External Attachment:    Type:   Image     Comment:   External Document

## 2010-03-20 NOTE — Assessment & Plan Note (Signed)
Summary: 2 MTH ROV // RS/PT RSC/CJR   Vital Signs:  Patient profile:   70 year old male Height:      72 inches Weight:      204 pounds BMI:     27.77 Temp:     98.2 degrees F oral Pulse rate:   60 / minute Resp:     14 per minute BP sitting:   136 / 80  (left arm)  Vitals Entered By: Willy Eddy, LPN (May 29, 2009 3:43 PM) CC: roa, Hypertension Management, Back Pain, Lipid Management   Primary Care Provider:  Stacie Glaze MD  CC:  roa, Hypertension Management, Back Pain, and Lipid Management.  History of Present Illness: follow up BP with reduction in salt and better control of blood pressure wife reports mole on flank wth change n color and size pain control adequate due lipid monitering  Back Pain History:      The pain is located in the lower back region and does not radiate below the knees.  He states this is not work related.  On a scale of 1-10, he describes the pain as a 78.  He states that he has had a prior history of back pain.  The patient has had a recent course of physical therapy.    Hypertension History:      He denies headache, chest pain, palpitations, dyspnea with exertion, orthopnea, PND, peripheral edema, visual symptoms, neurologic problems, syncope, and side effects from treatment.        Positive major cardiovascular risk factors include male age 46 years old or older, hyperlipidemia, and hypertension.  Negative major cardiovascular risk factors include non-tobacco-user status.        Positive history for target organ damage include ASHD (either angina/prior MI/prior CABG).  Further assessment for target organ damage reveals no history of stroke/TIA or peripheral vascular disease.    Lipid Management History:      Positive NCEP/ATP III risk factors include male age 71 years old or older, HDL cholesterol less than 40, hypertension, and ASHD (either angina/prior MI/prior CABG).  Negative NCEP/ATP III risk factors include non-tobacco-user status, no  prior stroke/TIA, no peripheral vascular disease, and no history of aortic aneurysm.       Preventive Screening-Counseling & Management  Alcohol-Tobacco     Smoking Status: quit     Year Started: 1992     Year Quit: 1998     Cigars/week: 7     Passive Smoke Exposure: no  Problems Prior to Update: 1)  Other Idiopathic Peripheral Autonomic Neuropathy  (ICD-337.09) 2)  Coronary Artery Disease  (ICD-414.00) 3)  Hypertension  (ICD-401.9) 4)  Hyperlipidemia  (ICD-272.4) 5)  Pre-operative Cardiovascular Examination  (ICD-V72.81) 6)  Spinal Stenosis of Lumbar Region  (ICD-724.02) 7)  Cerebral Atherosclerosis  (ICD-437.0) 8)  Erectile Dysfunction  (ICD-302.72) 9)  Preventive Health Care  (ICD-V70.0) 10)  Benign Prostatic Hypertrophy, With Urinary Obstruction  (ICD-600.01) 11)  Pneumonia  (ICD-486) 12)  Sleep Apnea, Obstructive, Moderate  (ICD-327.23) 13)  Symptom, Nocturia  (ICD-788.43) 14)  Family History of Cad Male 1st Degree Relative <50  (ICD-V17.3) 15)  Peptic Ulcer Disease  (ICD-533.90) 16)  Low Back Pain  (ICD-724.2) 17)  Headache  (ICD-784.0)  Current Problems (verified): 1)  Other Idiopathic Peripheral Autonomic Neuropathy  (ICD-337.09) 2)  Coronary Artery Disease  (ICD-414.00) 3)  Hypertension  (ICD-401.9) 4)  Hyperlipidemia  (ICD-272.4) 5)  Pre-operative Cardiovascular Examination  (ICD-V72.81) 6)  Spinal Stenosis of Lumbar Region  (  ICD-724.02) 7)  Cerebral Atherosclerosis  (ICD-437.0) 8)  Erectile Dysfunction  (ICD-302.72) 9)  Preventive Health Care  (ICD-V70.0) 10)  Benign Prostatic Hypertrophy, With Urinary Obstruction  (ICD-600.01) 11)  Pneumonia  (ICD-486) 12)  Sleep Apnea, Obstructive, Moderate  (ICD-327.23) 13)  Symptom, Nocturia  (ICD-788.43) 14)  Family History of Cad Male 1st Degree Relative <50  (ICD-V17.3) 15)  Peptic Ulcer Disease  (ICD-533.90) 16)  Low Back Pain  (ICD-724.2) 17)  Headache  (ICD-784.0)  Medications Prior to Update: 1)  Bystolic 10  Mg Tabs (Nebivolol Hcl) .... One By Mouth Daily 2)  Ms Contin 30 Mg  Tb12 (Morphine Sulfate) .... One Twice A Day 3)  Bayer Low Strength 81 Mg  Tbec (Aspirin) .... Once Daily 4)  Zegerid 40-1100 Mg  Caps (Omeprazole-Sodium Bicarbonate) .... Once Daily 5)  Amoxil 500 Mg Caps (Amoxicillin) .... 4 Prior To Dental Work 6)  Fish Oil 1200 Mg Caps (Omega-3 Fatty Acids) .... Two By Mouth Daily 7)  Niacin Flush Free 500 Mg Caps (Inositol Niacinate) .... One By Mouth Daily With The Aspirin 8)  Iron .... Daily 9)  Sertraline Hcl 100 Mg Tabs (Sertraline Hcl) .Marland Kitchen.. 1 Once Daily 10)  Zocor 40 Mg Tabs (Simvastatin) .... Take One Tablet Daily At Bedtime 11)  Rapaflo 8 Mg Caps (Silodosin) .... One By Mouth Daily  Current Medications (verified): 1)  Bystolic 10 Mg Tabs (Nebivolol Hcl) .... One By Mouth Daily 2)  Ms Contin 30 Mg  Tb12 (Morphine Sulfate) .... One Twice A Day 3)  Bayer Low Strength 81 Mg  Tbec (Aspirin) .... Once Daily 4)  Zegerid 40-1100 Mg  Caps (Omeprazole-Sodium Bicarbonate) .... Once Daily 5)  Amoxil 500 Mg Caps (Amoxicillin) .... 4 Prior To Dental Work 6)  Fish Oil 1200 Mg Caps (Omega-3 Fatty Acids) .... Two By Mouth Daily 7)  Niacin Flush Free 500 Mg Caps (Inositol Niacinate) .... One By Mouth Daily With The Aspirin 8)  Iron .... Daily 9)  Sertraline Hcl 100 Mg Tabs (Sertraline Hcl) .Marland Kitchen.. 1 Once Daily 10)  Zocor 40 Mg Tabs (Simvastatin) .... Take One Tablet Daily At Bedtime 11)  Rapaflo 8 Mg Caps (Silodosin) .... One By Mouth Daily  Allergies (verified): 1)  ! Tylenol  Past History:  Family History: Last updated: 07/26/2008 Family History of CAD Male 1st degree relative <50  died at 85 mother had a blood colt dueing open heart surgery in her 75's  Social History: Last updated: 07/26/2008 Former Smoker Married Alcohol use-no Drug use-no Worked at [employer].  Now does diesel repair work part time  Risk Factors: Smoking Status: quit (05/29/2009) Cigars/wk: 7  (05/29/2009) Passive Smoke Exposure: no (05/29/2009)  Past medical, surgical, family and social histories (including risk factors) reviewed for relevance to current acute and chronic problems.  Past Medical History: Reviewed history from 07/26/2008 and no changes required. Current Problems:  CORONARY ARTERY DISEASE (ICD-414.00) HYPERTENSION (ICD-401.9) HYPERLIPIDEMIA (ICD-272.4) PRE-OPERATIVE CARDIOVASCULAR EXAMINATION (ICD-V72.81) SPINAL STENOSIS OF LUMBAR REGION (ICD-724.02) CEREBRAL ATHEROSCLEROSIS (ICD-437.0) ERECTILE DYSFUNCTION (ICD-302.72) PREVENTIVE HEALTH CARE (ICD-V70.0) BENIGN PROSTATIC HYPERTROPHY, WITH URINARY OBSTRUCTION (ICD-600.01) PNEUMONIA (ICD-486) SLEEP APNEA, OBSTRUCTIVE, MODERATE (ICD-327.23) SYMPTOM, NOCTURIA (ICD-788.43) FAMILY HISTORY OF CAD MALE 1ST DEGREE RELATIVE <50 (ICD-V17.3) PEPTIC ULCER DISEASE (ICD-533.90) LOW BACK PAIN (ICD-724.2) HEADACHE (ICD-784.0)  Past Surgical History: Reviewed history from 07/26/2008 and no changes required. EGD-2004 Colonoscopy-2004 Cath-1999 Stent Lumbar laminectomy Lumbar fusion athroscopy left knee  Right shoulder arthroscopy   Right knee arthroscopy   Decompression of the median nerve, left wrist and  hand.  Family History: Reviewed history from 07/26/2008 and no changes required. Family History of CAD Male 1st degree relative <50  died at 2 mother had a blood colt dueing open heart surgery in her 74's  Social History: Reviewed history from 07/26/2008 and no changes required. Former Smoker Married Alcohol use-no Drug use-no Worked at [employer].  Now does diesel repair work part time  Review of Systems       The patient complains of suspicious skin lesions.  The patient denies anorexia, fever, weight loss, weight gain, vision loss, decreased hearing, hoarseness, chest pain, syncope, dyspnea on exertion, peripheral edema, prolonged cough, headaches, hemoptysis, abdominal pain, melena, hematochezia, severe  indigestion/heartburn, hematuria, incontinence, genital sores, muscle weakness, transient blindness, difficulty walking, depression, unusual weight change, abnormal bleeding, enlarged lymph nodes, angioedema, breast masses, and testicular masses.    Physical Exam  General:  Well-developed,well-nourished,in no acute distress; alert,appropriate and cooperative throughout examination Head:  male-pattern balding.  normocephalic.   Eyes:  pupils equal and pupils round.   Ears:  R ear normal and L ear normal.   Nose:  no external deformity and no nasal discharge.   Mouth:  pharyngeal crowding and low-lying uvula.   Neck:  No deformities, masses, or tenderness noted. Lungs:  normal respiratory effort and no wheezes.   Heart:  normal rate and regular rhythm.   Abdomen:  soft, non-tender, and normal bowel sounds.   Msk:  normal ROM, lumbar lordosis, and SI joint tenderness.   Extremities:  trace left pedal edema and trace right pedal edema.   Neurologic:  alert & oriented X3.  sonolent Skin:  dysplastic nevi.   Psych:  Oriented X3 and not anxious appearing.    Low Back Pain Physical Exam:    Motor Exam/Strength:         Left Ankle Dorsiflexion (L5,L4):     normal       Left Great Toe Dorsiflexion (L5,L4):     normal       Left Heel Walk (L5,some L4):     normal       Right Ankle Dorsiflexion (L5,L4):     normal       Right Great Toe Dorsiflexion (L5,L4):       normal       Right Heel Walk (L5,some L4):     normal    Sensory Exam/Pinprick:        Left Medial Foot (L4):   normal       Left Dorsal Foot (L5):   normal       Left Lateral Foot (S1):   normal       Right Medial Foot (L4):   normal       Right Dorsal Foot (L5):   normal       Right Lateral Foot (S1):   normal    Reflexes:        Left Knee Jerk (L4):     normal       Right Knee Jerk:     normal    Straight Leg Raise (SLR):       Left Straight Leg Raise (SLR):   positive at 20 degrees and phase 1       Right Straight Leg Raise  (SLR):   positive at 20 degrees and phase 1   Impression & Recommendations:  Problem # 1:  HYPERTENSION (ICD-401.9)  His updated medication list for this problem includes:    Bystolic 10 Mg Tabs (Nebivolol hcl) ..... One  by mouth daily  BP today: 136/80 Prior BP: 146/80 (03/23/2009)  Prior 10 Yr Risk Heart Disease: N/A (11/24/2006)  Labs Reviewed: K+: 4.6 (07/04/2008) Creat: : 1.1 (07/04/2008)   Chol: 134 (03/28/2008)   HDL: 25.4 (03/28/2008)   LDL: DEL (03/28/2008)   TG: 228 (03/28/2008)  Orders: TLB-BMP (Basic Metabolic Panel-BMET) (80048-METABOL)  Problem # 2:  HYPERLIPIDEMIA (ICD-272.4)  His updated medication list for this problem includes:    Zocor 40 Mg Tabs (Simvastatin) .Marland Kitchen... Take one tablet daily at bedtime  Labs Reviewed: SGOT: 22 (03/28/2008)   SGPT: 21 (03/28/2008)  Lipid Goals: Chol Goal: 200 (06/19/2007)   HDL Goal: 40 (06/19/2007)   LDL Goal: 100 (06/19/2007)   TG Goal: 150 (06/19/2007)  Prior 10 Yr Risk Heart Disease: N/A (11/24/2006)   HDL:25.4 (03/28/2008), 27.3 (09/18/2007)  LDL:DEL (03/28/2008), DEL (09/18/2007)  Chol:134 (03/28/2008), 153 (09/18/2007)  Trig:228 (03/28/2008), 259 (09/18/2007)  Orders: Venipuncture (16109) TLB-TSH (Thyroid Stimulating Hormone) (84443-TSH) TLB-Cholesterol, HDL (83718-HDL) TLB-Cholesterol, Direct LDL (83721-DIRLDL) TLB-Cholesterol, Total (82465-CHO)  Problem # 3:  SPINAL STENOSIS OF LUMBAR REGION (ICD-724.02) refill meds  Problem # 4:  LOW BACK PAIN (ICD-724.2)  His updated medication list for this problem includes:    Ms Contin 30 Mg Tb12 (Morphine sulfate) ..... One twice a day    Bayer Low Strength 81 Mg Tbec (Aspirin) ..... Once daily  Discussed use of moist heat or ice, modified activities, medications, and stretching/strengthening exercises. Back care instructions given. To be seen in 2 weeks if no improvement; sooner if worsening of symptoms.   Problem # 5:  MELANOMA, SPREADING, SUPERFICIAL  (ICD-172.9) Assessment: New wife noted darkedn spreading lesion on abd wall clinicall appears t be superfical spreading melanoma pt was preped in a sterile manor and informed consent obtained. Using a 15 blade the lesion was removed and sent for pathology. sterile dressings were applied  and wound care discussed with the pt.  Orders: Excise Malig lesion (TAL) 0.6 - 1.0 cm (11601)  Complete Medication List: 1)  Bystolic 10 Mg Tabs (Nebivolol hcl) .... One by mouth daily 2)  Ms Contin 30 Mg Tb12 (Morphine sulfate) .... One twice a day 3)  Bayer Low Strength 81 Mg Tbec (Aspirin) .... Once daily 4)  Zegerid 40-1100 Mg Caps (Omeprazole-sodium bicarbonate) .... Once daily 5)  Amoxil 500 Mg Caps (Amoxicillin) .... 4 prior to dental work 6)  Fish Oil 1200 Mg Caps (Omega-3 fatty acids) .... Two by mouth daily 7)  Niacin Flush Free 500 Mg Caps (Inositol niacinate) .... One by mouth daily with the aspirin 8)  Iron  .... Daily 9)  Sertraline Hcl 100 Mg Tabs (Sertraline hcl) .Marland Kitchen.. 1 once daily 10)  Zocor 40 Mg Tabs (Simvastatin) .... Take one tablet daily at bedtime 11)  Rapaflo 8 Mg Caps (Silodosin) .... One by mouth daily  Hypertension Assessment/Plan:      The patient's hypertensive risk group is category C: Target organ damage and/or diabetes.  Today's blood pressure is 136/80.  His blood pressure goal is < 140/90.  Lipid Assessment/Plan:      Based on NCEP/ATP III, the patient's risk factor category is "history of coronary disease, peripheral vascular disease, cerebrovascular disease, or aortic aneurysm".  The patient's lipid goals are as follows: Total cholesterol goal is 200; LDL cholesterol goal is 100; HDL cholesterol goal is 40; Triglyceride goal is 150.  His LDL cholesterol goal has not been met.  Secondary causes for hyperlipidemia have been ruled out.  He has been counseled on adjunctive  measures for lowering his cholesterol and has been provided with dietary instructions.    Patient  Instructions: 1)  can pick up three months of rx around 5/1 for MS contin 2)  Please schedule a follow-up appointment in 4 months.

## 2010-03-20 NOTE — Assessment & Plan Note (Signed)
Summary: 1 month follow up/cjr/pt rescd from bump//ccm   Vital Signs:  Patient profile:   70 year old male Height:      72 inches Weight:      200 pounds BMI:     27.22 Temp:     98.2 degrees F oral Pulse rate:   56 / minute Resp:     14 per minute BP sitting:   146 / 80  (left arm)  Vitals Entered By: Willy Eddy, LPN (March 23, 2009 1:13 PM) CC: 1 month roa for bp and rapaflo- to er several days after last ov for elevated bp,but had eaten pizza with anchovies--no further problem-- states he thinks rapaflo is helping   Primary Care Provider:  Stacie Glaze MD  CC:  1 month roa for bp and rapaflo- to er several days after last ov for elevated bp and but had eaten pizza with anchovies--no further problem-- states he thinks rapaflo is helping.  History of Present Illness: present for follow up of new antihypertensive  Hypertension Follow-Up      This is a 70 year old man who presents for Hypertension follow-up.  new med started one month ago.  The patient reports headaches, but denies lightheadedness, urinary frequency, edema, impotence, rash, and fatigue.  Associated symptoms include pedal edema.  The patient denies the following associated symptoms: chest pain, chest pressure, exercise intolerance, dyspnea, palpitations, syncope, and leg edema.  Compliance with medications (by patient report) has been near 100%.  The patient reports that dietary compliance has been good.  The patient reports exercising occasionally.    asl review of worsening neuropahty and lipid control  Preventive Screening-Counseling & Management  Alcohol-Tobacco     Smoking Status: quit     Year Started: 1992     Year Quit: 1998     Cigars/week: 7     Passive Smoke Exposure: no  Problems Prior to Update: 1)  Coronary Artery Disease  (ICD-414.00) 2)  Hypertension  (ICD-401.9) 3)  Hyperlipidemia  (ICD-272.4) 4)  Pre-operative Cardiovascular Examination  (ICD-V72.81) 5)  Spinal Stenosis of  Lumbar Region  (ICD-724.02) 6)  Cerebral Atherosclerosis  (ICD-437.0) 7)  Erectile Dysfunction  (ICD-302.72) 8)  Preventive Health Care  (ICD-V70.0) 9)  Benign Prostatic Hypertrophy, With Urinary Obstruction  (ICD-600.01) 10)  Pneumonia  (ICD-486) 11)  Sleep Apnea, Obstructive, Moderate  (ICD-327.23) 12)  Symptom, Nocturia  (ICD-788.43) 13)  Family History of Cad Male 1st Degree Relative <50  (ICD-V17.3) 14)  Peptic Ulcer Disease  (ICD-533.90) 15)  Low Back Pain  (ICD-724.2) 16)  Headache  (ICD-784.0)  Current Problems (verified): 1)  Coronary Artery Disease  (ICD-414.00) 2)  Hypertension  (ICD-401.9) 3)  Hyperlipidemia  (ICD-272.4) 4)  Pre-operative Cardiovascular Examination  (ICD-V72.81) 5)  Spinal Stenosis of Lumbar Region  (ICD-724.02) 6)  Cerebral Atherosclerosis  (ICD-437.0) 7)  Erectile Dysfunction  (ICD-302.72) 8)  Preventive Health Care  (ICD-V70.0) 9)  Benign Prostatic Hypertrophy, With Urinary Obstruction  (ICD-600.01) 10)  Pneumonia  (ICD-486) 11)  Sleep Apnea, Obstructive, Moderate  (ICD-327.23) 12)  Symptom, Nocturia  (ICD-788.43) 13)  Family History of Cad Male 1st Degree Relative <50  (ICD-V17.3) 14)  Peptic Ulcer Disease  (ICD-533.90) 15)  Low Back Pain  (ICD-724.2) 16)  Headache  (ICD-784.0)  Medications Prior to Update: 1)  Bystolic 10 Mg Tabs (Nebivolol Hcl) .... One By Mouth Daily 2)  Ms Contin 30 Mg  Tb12 (Morphine Sulfate) .... One Twice A Day 3)  Bayer Low  Strength 81 Mg  Tbec (Aspirin) .... Once Daily 4)  Zegerid 40-1100 Mg  Caps (Omeprazole-Sodium Bicarbonate) .... Once Daily 5)  Amoxil 500 Mg Caps (Amoxicillin) .... 4 Prior To Dental Work 6)  Fish Oil 1200 Mg Caps (Omega-3 Fatty Acids) .... Two By Mouth Daily 7)  Niacin Flush Free 500 Mg Caps (Inositol Niacinate) .... One By Mouth Daily With The Aspirin 8)  Iron .... Daily 9)  Sertraline Hcl 100 Mg Tabs (Sertraline Hcl) .Marland Kitchen.. 1 Once Daily 10)  Zocor 40 Mg Tabs (Simvastatin) .... Take One Tablet  Daily At Bedtime 11)  Rapaflo 8 Mg Caps (Silodosin) .... One By Mouth Daily  Current Medications (verified): 1)  Bystolic 10 Mg Tabs (Nebivolol Hcl) .... One By Mouth Daily 2)  Ms Contin 30 Mg  Tb12 (Morphine Sulfate) .... One Twice A Day 3)  Bayer Low Strength 81 Mg  Tbec (Aspirin) .... Once Daily 4)  Zegerid 40-1100 Mg  Caps (Omeprazole-Sodium Bicarbonate) .... Once Daily 5)  Amoxil 500 Mg Caps (Amoxicillin) .... 4 Prior To Dental Work 6)  Fish Oil 1200 Mg Caps (Omega-3 Fatty Acids) .... Two By Mouth Daily 7)  Niacin Flush Free 500 Mg Caps (Inositol Niacinate) .... One By Mouth Daily With The Aspirin 8)  Iron .... Daily 9)  Sertraline Hcl 100 Mg Tabs (Sertraline Hcl) .Marland Kitchen.. 1 Once Daily 10)  Zocor 40 Mg Tabs (Simvastatin) .... Take One Tablet Daily At Bedtime 11)  Rapaflo 8 Mg Caps (Silodosin) .... One By Mouth Daily  Allergies (verified): 1)  ! Tylenol  Past History:  Family History: Last updated: 07/26/2008 Family History of CAD Male 1st degree relative <50  died at 77 mother had a blood colt dueing open heart surgery in her 8's  Social History: Last updated: 07/26/2008 Former Smoker Married Alcohol use-no Drug use-no Worked at [employer].  Now does diesel repair work part time  Risk Factors: Smoking Status: quit (03/23/2009) Cigars/wk: 7 (03/23/2009) Passive Smoke Exposure: no (03/23/2009)  Past medical, surgical, family and social histories (including risk factors) reviewed for relevance to current acute and chronic problems.  Past Medical History: Reviewed history from 07/26/2008 and no changes required. Current Problems:  CORONARY ARTERY DISEASE (ICD-414.00) HYPERTENSION (ICD-401.9) HYPERLIPIDEMIA (ICD-272.4) PRE-OPERATIVE CARDIOVASCULAR EXAMINATION (ICD-V72.81) SPINAL STENOSIS OF LUMBAR REGION (ICD-724.02) CEREBRAL ATHEROSCLEROSIS (ICD-437.0) ERECTILE DYSFUNCTION (ICD-302.72) PREVENTIVE HEALTH CARE (ICD-V70.0) BENIGN PROSTATIC HYPERTROPHY, WITH URINARY  OBSTRUCTION (ICD-600.01) PNEUMONIA (ICD-486) SLEEP APNEA, OBSTRUCTIVE, MODERATE (ICD-327.23) SYMPTOM, NOCTURIA (ICD-788.43) FAMILY HISTORY OF CAD MALE 1ST DEGREE RELATIVE <50 (ICD-V17.3) PEPTIC ULCER DISEASE (ICD-533.90) LOW BACK PAIN (ICD-724.2) HEADACHE (ICD-784.0)  Past Surgical History: Reviewed history from 07/26/2008 and no changes required. EGD-2004 Colonoscopy-2004 Cath-1999 Stent Lumbar laminectomy Lumbar fusion athroscopy left knee  Right shoulder arthroscopy   Right knee arthroscopy   Decompression of the median nerve, left wrist and hand.  Family History: Reviewed history from 07/26/2008 and no changes required. Family History of CAD Male 1st degree relative <50  died at 32 mother had a blood colt dueing open heart surgery in her 1's  Social History: Reviewed history from 07/26/2008 and no changes required. Former Smoker Married Alcohol use-no Drug use-no Worked at [employer].  Now does diesel repair work part time  Review of Systems       The patient complains of peripheral edema.  The patient denies anorexia, fever, weight loss, weight gain, vision loss, decreased hearing, hoarseness, chest pain, syncope, dyspnea on exertion, prolonged cough, headaches, hemoptysis, abdominal pain, melena, hematochezia, severe indigestion/heartburn, hematuria, incontinence, genital sores, muscle  weakness, suspicious skin lesions, transient blindness, difficulty walking, depression, unusual weight change, abnormal bleeding, enlarged lymph nodes, angioedema, breast masses, and testicular masses.    Physical Exam  General:  Well-developed,well-nourished,in no acute distress; alert,appropriate and cooperative throughout examination Head:  male-pattern balding.  normocephalic.   Eyes:  pupils equal and pupils round.   Nose:  no external deformity and no nasal discharge.   Mouth:  pharyngeal crowding and low-lying uvula.   Neck:  No deformities, masses, or tenderness noted. Lungs:   normal respiratory effort and no wheezes.   Heart:  normal rate and regular rhythm.   Abdomen:  soft, non-tender, and normal bowel sounds.     Impression & Recommendations:  Problem # 1:  OTHER IDIOPATHIC PERIPHERAL AUTONOMIC NEUROPATHY (ICD-337.09) screened for dm at [facility] was negative has emg planned and follow up with their neurology  Problem # 2:  HYPERTENSION (ICD-401.9)  His updated medication list for this problem includes:    Bystolic 10 Mg Tabs (Nebivolol hcl) ..... One by mouth daily  BP today: 146/80 Prior BP: 130/80 (02/20/2009)  Prior 10 Yr Risk Heart Disease: N/A (11/24/2006)  Labs Reviewed: K+: 4.6 (07/04/2008) Creat: : 1.1 (07/04/2008)   Chol: 134 (03/28/2008)   HDL: 25.4 (03/28/2008)   LDL: DEL (03/28/2008)   TG: 228 (03/28/2008)  Problem # 3:  HYPERLIPIDEMIA (ICD-272.4)  His updated medication list for this problem includes:    Zocor 40 Mg Tabs (Simvastatin) .Marland Kitchen... Take one tablet daily at bedtime  Labs Reviewed: SGOT: 22 (03/28/2008)   SGPT: 21 (03/28/2008)  Lipid Goals: Chol Goal: 200 (06/19/2007)   HDL Goal: 40 (06/19/2007)   LDL Goal: 100 (06/19/2007)   TG Goal: 150 (06/19/2007)  Prior 10 Yr Risk Heart Disease: N/A (11/24/2006)   HDL:25.4 (03/28/2008), 27.3 (09/18/2007)  LDL:DEL (03/28/2008), DEL (09/18/2007)  Chol:134 (03/28/2008), 153 (09/18/2007)  Trig:228 (03/28/2008), 259 (09/18/2007)  Complete Medication List: 1)  Bystolic 10 Mg Tabs (Nebivolol hcl) .... One by mouth daily 2)  Ms Contin 30 Mg Tb12 (Morphine sulfate) .... One twice a day 3)  Bayer Low Strength 81 Mg Tbec (Aspirin) .... Once daily 4)  Zegerid 40-1100 Mg Caps (Omeprazole-sodium bicarbonate) .... Once daily 5)  Amoxil 500 Mg Caps (Amoxicillin) .... 4 prior to dental work 6)  Fish Oil 1200 Mg Caps (Omega-3 fatty acids) .... Two by mouth daily 7)  Niacin Flush Free 500 Mg Caps (Inositol niacinate) .... One by mouth daily with the aspirin 8)  Iron  .... Daily 9)  Sertraline Hcl  100 Mg Tabs (Sertraline hcl) .Marland Kitchen.. 1 once daily 10)  Zocor 40 Mg Tabs (Simvastatin) .... Take one tablet daily at bedtime 11)  Rapaflo 8 Mg Caps (Silodosin) .... One by mouth daily  Other Orders: Prescription Created Electronically [phone])  Patient Instructions: 1)  Please schedule a follow-up appointment in 3 months. Prescriptions: MS CONTIN 30 MG  TB12 (MORPHINE SULFATE) one twice a day  #60 x 0   Entered by:   Willy Eddy, LPN   Authorized by:   Stacie Glaze MD   Signed by:   Willy Eddy, LPN on 24/40/1027   Method used:   Print then Give to Patient   RxID:   [id number] MS CONTIN 30 MG  TB12 (MORPHINE SULFATE) one twice a day  #60 x 0   Entered by:   Willy Eddy, LPN   Authorized by:   Stacie Glaze MD   Signed by:   Willy Eddy, LPN on  03/23/2009   Method used:   Print then Give to Patient   RxID:   [id number] MS CONTIN 30 MG  TB12 (MORPHINE SULFATE) one twice a day  #60 x 0   Entered by:   Willy Eddy, LPN   Authorized by:   Stacie Glaze MD   Signed by:   Willy Eddy, LPN on 14/78/2956   Method used:   Print then Give to Patient   RxID:   [id number]

## 2010-03-20 NOTE — Progress Notes (Signed)
Summary: new rx  Phone Note Call from Patient Call back at [phone]   Caller: Patient Call For: Seth Glaze MD Complaint: Breathing Problems Summary of Call: pt needs new rx for ms cotin 30 mg call when ready for pick up Initial call taken by: Heron Sabins,  June 15, 2009 9:22 AM    Prescriptions: MS CONTIN 30 MG  TB12 (MORPHINE SULFATE) one twice a day  #60 x 0   Entered by:   Willy Eddy, LPN   Authorized by:   Seth Glaze MD   Signed by:   Willy Eddy, LPN on 52/84/1324   Method used:   Print then Give to Patient   RxID:   [id number] MS CONTIN 30 MG  TB12 (MORPHINE SULFATE) one twice a day  #60 x 0   Entered by:   Willy Eddy, LPN   Authorized by:   Seth Glaze MD   Signed by:   Willy Eddy, LPN on 74/25/9563   Method used:   Print then Give to Patient   RxID:   [id number] MS CONTIN 30 MG  TB12 (MORPHINE SULFATE) one twice a day  #60 x 0   Entered by:   Willy Eddy, LPN   Authorized by:   Seth Glaze MD   Signed by:   Willy Eddy, LPN on 60/63/0160   Method used:   Print then Give to Patient   RxID:   [id number]

## 2010-03-20 NOTE — Progress Notes (Signed)
Summary: sinus  Phone Note Call from Patient Call back at Home Phone [phone]   Caller: Patient Summary of Call: Headache and post nasal drainage.  No fever. no cough.  Green drainage from nose.  Wants meds for sinus. [facility]. Initial call taken by: Lynann Beaver CMA AAMA,  January 18, 2010 10:11 AM  Follow-up for Phone Call        zpack and use delsym otc Follow-up by: Stacie Glaze MD,  January 18, 2010 10:42 AM    New/Updated Medications: DELSYM 30 MG/5ML LQCR (DEXTROMETHORPHAN POLISTIREX) as directed ZITHROMAX Z-PAK 250 MG TABS (AZITHROMYCIN) as directed Prescriptions: ZITHROMAX Z-PAK 250 MG TABS (AZITHROMYCIN) as directed  #1 pack x 0   Entered by:   Lynann Beaver CMA AAMA   Authorized by:   Stacie Glaze MD   Signed by:   Lynann Beaver CMA AAMA on 01/18/2010   Method used:   Electronically to        [facility]* (retail)       [address] [place], [place]  [address]       Ph: [phone] or [phone]       Fax: [phone]   RxID:   [id number] DELSYM 30 MG/5ML LQCR (DEXTROMETHORPHAN POLISTIREX) as directed  #1 bottle x prn   Entered by:   Lynann Beaver CMA AAMA   Authorized by:   Stacie Glaze MD   Signed by:   Lynann Beaver CMA AAMA on 01/18/2010   Method used:   Electronically to        [facility]* (retail)       [address] [place], [place]  [address]       Ph: [phone] or [phone]       Fax: [phone]   RxID:   [id number]  Notified pt.

## 2010-03-20 NOTE — Progress Notes (Signed)
Summary: please advise of injury  Phone Note Call from Patient Call back at Home Phone [phone]   Caller: Patient---voice mail Complaint: Urinary/GYN Problems Summary of Call: pt fell on Saturday afternoon. having left side face, shoulder, knee pain. He went to ER. Did xrays. Nothing broken. Initial call taken by: Warnell Forester,  December 05, 2009 2:26 PM  Follow-up for Phone Call        talked with wife and pt wanted dr Lovell Sheehan to be aware of fall-he had just gotten out of rehab for knee replacement and wasnt using his cane and due to his nueropathy he feel.. when asked if pain med was too strong, she replied no that it was the nueropathy that caused his fall Follow-up by: Willy Eddy, LPN,  December 05, 2009 4:51 PM

## 2010-03-20 NOTE — Progress Notes (Signed)
Summary: PHONE CALL CONCERNING RESULTS OF BIOPSY  Phone Note Call from Patient   Caller: Patient  [phone] Reason for Call: Talk to Nurse Summary of Call: Info only...Marland KitchenMarland KitchenMarland Kitchen Pt called to adv that he can be reached at   [phone]   or   [phone]  with the results of biopsy.... Pt adv that if he is not available then you can advise his wife Bonita Quin) of the results.  Initial call taken by: Debbra Riding,  May 30, 2009 9:48 AM  Follow-up for Phone Call        noted Follow-up by: Willy Eddy, LPN,  May 30, 2009 10:02 AM

## 2010-03-20 NOTE — Assessment & Plan Note (Signed)
Summary: leg bruised and painful /bmw   Vital Signs:  Patient profile:   70 year old male Height:      72 inches (182.88 cm) Weight:      205 pounds (93.18 kg) O2 Sat:      96 % on Room air Temp:     98.3 degrees F (36.83 degrees C) oral Pulse rate:   57 / minute BP sitting:   132 / 82  (left arm) Cuff size:   regular  Vitals Entered By: Josph Macho RMA (September 05, 2009 10:36 AM)  O2 Flow:  Room air CC: Left leg bruised and painful X10 days- pt states he doesn't recall hitting his leg on anything/ CF Is Patient Diabetic? No   History of Present Illness: Patient in today with a 10 day history of pain/bruising and swelling in lower part of left leg. He denies any recent fall or trauma. He does have periopheral neuropathy which is presently being managed by Dr Larose Hires. He reports he normally does fall frequently due to the neuropathy but has not fallen for about 3 weeks. About 10 days ago he noted a small, tender bruise along medial aspect of left upper calf and he thought it would resolve. It is mildly tender to palpation and unfortunately is slowly spreading. The bruising, swelling and tenderness have spread both anteriorly and posteriorly and he was concerned so he came in. Denies any recent illness/CP/palp/SOB/GI concerns.  Current Medications (verified): 1)  Bystolic 10 Mg Tabs (Nebivolol Hcl) .... One By Mouth Daily 2)  Ms Contin 30 Mg  Tb12 (Morphine Sulfate) .... One Twice A Day 3)  Bayer Low Strength 81 Mg  Tbec (Aspirin) .... Once Daily 4)  Zegerid 40-1100 Mg  Caps (Omeprazole-Sodium Bicarbonate) .... Once Daily 5)  Amoxil 500 Mg Caps (Amoxicillin) .... 4 Prior To Dental Work 6)  Fish Oil 1200 Mg Caps (Omega-3 Fatty Acids) .... Two By Mouth Daily 7)  Niacin Flush Free 500 Mg Caps (Inositol Niacinate) .... One By Mouth Daily With The Aspirin 8)  Iron .... Daily 9)  Sertraline Hcl 100 Mg Tabs (Sertraline Hcl) .Marland Kitchen.. 1 Once Daily 10)  Zocor 40 Mg Tabs (Simvastatin) .... Take One  Tablet Daily At Bedtime 11)  Rapaflo 8 Mg Caps (Silodosin) .... One By Mouth Daily  Allergies (verified): 1)  ! Tylenol  Past History:  Past medical history reviewed for relevance to current acute and chronic problems. Social history (including risk factors) reviewed for relevance to current acute and chronic problems.  Past Medical History: Reviewed history from 07/26/2008 and no changes required. Current Problems:  CORONARY ARTERY DISEASE (ICD-414.00) HYPERTENSION (ICD-401.9) HYPERLIPIDEMIA (ICD-272.4) PRE-OPERATIVE CARDIOVASCULAR EXAMINATION (ICD-V72.81) SPINAL STENOSIS OF LUMBAR REGION (ICD-724.02) CEREBRAL ATHEROSCLEROSIS (ICD-437.0) ERECTILE DYSFUNCTION (ICD-302.72) PREVENTIVE HEALTH CARE (ICD-V70.0) BENIGN PROSTATIC HYPERTROPHY, WITH URINARY OBSTRUCTION (ICD-600.01) PNEUMONIA (ICD-486) SLEEP APNEA, OBSTRUCTIVE, MODERATE (ICD-327.23) SYMPTOM, NOCTURIA (ICD-788.43) FAMILY HISTORY OF CAD MALE 1ST DEGREE RELATIVE <50 (ICD-V17.3) PEPTIC ULCER DISEASE (ICD-533.90) LOW BACK PAIN (ICD-724.2) HEADACHE (ICD-784.0)  Social History: Reviewed history from 07/26/2008 and no changes required. Former Smoker Married Alcohol use-no Drug use-no Worked at [employer].  Now does diesel repair work part time  Review of Systems      See HPI  Physical Exam  General:  Well-developed,well-nourished,in no acute distress; alert,appropriate and cooperative throughout examination Head:  Normocephalic and atraumatic without obvious abnormalities. No apparent alopecia or balding. Ears:  External ear exam shows no significant lesions or deformities.  Otoscopic examination reveals clear canals, tympanic membranes are  intact bilaterally without bulging, retraction, inflammation or discharge. Hearing is grossly normal bilaterally. Nose:  External nasal examination shows no deformity or inflammation. Nasal mucosa are pink and moist without lesions or exudates. Mouth:  Oral mucosa and oropharynx without  lesions or exudates.  Teeth in good repair. Neck:  No deformities, masses, or tenderness noted. Lungs:  Normal respiratory effort, chest expands symmetrically. Lungs are clear to auscultation, no crackles or wheezes. Heart:  normal rate, regular rhythm, and grade 1 /6 systolic murmur.   Abdomen:  Bowel sounds positive,abdomen soft and non-tender without masses, organomegaly or hernias noted. Extremities:  mild swelling and pain with palpation noted along proximal tibial plateau and wrapping around to posterior calf. Prominent engorged vessel traverses back of calf. Neg Homan's. There is some ecchymosis over the same region which is faint. Psych:  Cognition and judgment appear intact. Alert and cooperative with normal attention span and concentration. No apparent delusions, illusions, hallucinations   Impression & Recommendations:  Problem # 1:  CALF PAIN, LEFT (ICD-729.5)  Orders: Doppler Referral (Doppler) check an xray of Left tib/fib due to history of falls and signs of ecchymosis. Apply moist heat to region three times a day and cont MS Contin, add Tylenol to manage pain. Imaging will be complete today. If no DVT will watch for resolution if no resolution in a couple of weeks will otify office to proceed with further evaluation  Problem # 2:  HYPERTENSION (ICD-401.9)  His updated medication list for this problem includes:    Bystolic 10 Mg Tabs (Nebivolol hcl) ..... One by mouth daily  Well controlled despite pain  Complete Medication List: 1)  Bystolic 10 Mg Tabs (Nebivolol hcl) .... One by mouth daily 2)  Ms Contin 30 Mg Tb12 (Morphine sulfate) .... One twice a day 3)  Bayer Low Strength 81 Mg Tbec (Aspirin) .... Once daily 4)  Zegerid 40-1100 Mg Caps (Omeprazole-sodium bicarbonate) .... Once daily 5)  Amoxil 500 Mg Caps (Amoxicillin) .... 4 prior to dental work 6)  Fish Oil 1200 Mg Caps (Omega-3 fatty acids) .... Two by mouth daily 7)  Niacin Flush Free 500 Mg Caps (Inositol  niacinate) .... One by mouth daily with the aspirin 8)  Iron  .... Daily 9)  Sertraline Hcl 100 Mg Tabs (Sertraline hcl) .Marland Kitchen.. 1 once daily 10)  Zocor 40 Mg Tabs (Simvastatin) .... Take one tablet daily at bedtime 11)  Rapaflo 8 Mg Caps (Silodosin) .... One by mouth daily  Other Orders: T-Tib/Fib Left ([id number])  Patient Instructions: 1)  Please schedule a follow-up appointment as needed if lesion worsens or does not resolve over next 2 weeks please return for further evaluation. 2)  Today proceed to have xray and ultrasound completed on left leg to ruloe out a blood clot or fracture.  3)  Apply moist/warm compresses three times a day to leg  for next week. 4)  Take 650 - 1000 mg of tylenol every 4-6 hours as needed for relief of pain or comfort of fever. Avoid taking more than 4000 mg in a 24 hour period( can cause liver damage in higher doses).    Appended Document: leg bruised and painful /bmw refered to ortho asap

## 2010-03-20 NOTE — Miscellaneous (Signed)
Summary: Consent for Mole Removal  Consent for Mole Removal   Imported By: Maryln Gottron 06/01/2009 09:10:27  _____________________________________________________________________  External Attachment:    Type:   Image     Comment:   External Document

## 2010-03-20 NOTE — Miscellaneous (Signed)
Summary: Care Plan, [facility]  Care Plan, [facility]   Imported By: Maryln Gottron 11/08/2009 11:23:31  _____________________________________________________________________  External Attachment:    Type:   Image     Comment:   External Document

## 2010-03-20 NOTE — Assessment & Plan Note (Signed)
Summary: 2 month rov/njr   Vital Signs:  Patient profile:   70 year old male Height:      72 inches Weight:      200 pounds BMI:     27.22 Temp:     98.2 degrees F oral Pulse rate:   64 / minute Pulse rhythm:   regular Resp:     14 per minute BP sitting:   130 / 80  (left arm)  Vitals Entered By: Willy Eddy, LPN (February 20, 2009 10:49 AM)  Nutrition Counseling: Patient's BMI is greater than 25 and therefore counseled on weight management options. CC: roa rapaflo-just starting to work- also c/ofatigue an d not sleeping well and restless leg, Hypertension Management, Lipid Management   Primary Care Provider:  Stacie Glaze MD  CC:  roa rapaflo-just starting to work- also c/ofatigue an d not sleeping well and restless leg, Hypertension Management, and Lipid Management.  History of Present Illness: follow up HTN and lipids as well as mamagement of chronic back pain. adequate pain control of back has a recurrent cluster head aches twice a week that seen to last longer that before  Hypertension History:      He denies headache, chest pain, palpitations, dyspnea with exertion, orthopnea, PND, peripheral edema, visual symptoms, neurologic problems, syncope, and side effects from treatment.        Positive major cardiovascular risk factors include male age 44 years old or older, hyperlipidemia, and hypertension.  Negative major cardiovascular risk factors include non-tobacco-user status.        Positive history for target organ damage include ASHD (either angina/prior MI/prior CABG).  Further assessment for target organ damage reveals no history of stroke/TIA or peripheral vascular disease.    Lipid Management History:      Positive NCEP/ATP III risk factors include male age 58 years old or older, HDL cholesterol less than 40, hypertension, and ASHD (either angina/prior MI/prior CABG).  Negative NCEP/ATP III risk factors include non-tobacco-user status, no prior stroke/TIA, no  peripheral vascular disease, and no history of aortic aneurysm.      Preventive Screening-Counseling & Management  Alcohol-Tobacco     Smoking Status: quit     Year Started: 1992     Year Quit: 1998     Cigars/week: 7     Passive Smoke Exposure: no  Problems Prior to Update: 1)  Coronary Artery Disease  (ICD-414.00) 2)  Hypertension  (ICD-401.9) 3)  Hyperlipidemia  (ICD-272.4) 4)  Pre-operative Cardiovascular Examination  (ICD-V72.81) 5)  Spinal Stenosis of Lumbar Region  (ICD-724.02) 6)  Cerebral Atherosclerosis  (ICD-437.0) 7)  Erectile Dysfunction  (ICD-302.72) 8)  Preventive Health Care  (ICD-V70.0) 9)  Benign Prostatic Hypertrophy, With Urinary Obstruction  (ICD-600.01) 10)  Pneumonia  (ICD-486) 11)  Sleep Apnea, Obstructive, Moderate  (ICD-327.23) 12)  Symptom, Nocturia  (ICD-788.43) 13)  Family History of Cad Male 1st Degree Relative <50  (ICD-V17.3) 14)  Peptic Ulcer Disease  (ICD-533.90) 15)  Low Back Pain  (ICD-724.2) 16)  Headache  (ICD-784.0)  Medications Prior to Update: 1)  Metoprolol Tartrate 50 Mg  Tabs (Metoprolol Tartrate) .Marland Kitchen.. 1  Tabonce Daily 2)  Ms Contin 30 Mg  Tb12 (Morphine Sulfate) .... One Twice A Day 3)  Bayer Low Strength 81 Mg  Tbec (Aspirin) .... Once Daily 4)  Zegerid 40-1100 Mg  Caps (Omeprazole-Sodium Bicarbonate) .... Once Daily 5)  Amoxil 500 Mg Caps (Amoxicillin) .... 4 Prior To Dental Work 6)  Fish Oil 1200 Mg  Caps (Omega-3 Fatty Acids) .... Two By Mouth Daily 7)  Niacin Flush Free 500 Mg Caps (Inositol Niacinate) .... One By Mouth Daily With The Aspirin 8)  Iron .... Daily 9)  Sertraline Hcl 100 Mg Tabs (Sertraline Hcl) .Marland Kitchen.. 1 Once Daily 10)  Zocor 40 Mg Tabs (Simvastatin) .... Take One Tablet Daily At Bedtime 11)  Rapaflo 8 Mg Caps (Silodosin) .... One By Mouth Daily  Current Medications (verified): 1)  Bystolic 10 Mg Tabs (Nebivolol Hcl) .... One By Mouth Daily 2)  Ms Contin 30 Mg  Tb12 (Morphine Sulfate) .... One Twice A Day 3)   Bayer Low Strength 81 Mg  Tbec (Aspirin) .... Once Daily 4)  Zegerid 40-1100 Mg  Caps (Omeprazole-Sodium Bicarbonate) .... Once Daily 5)  Amoxil 500 Mg Caps (Amoxicillin) .... 4 Prior To Dental Work 6)  Fish Oil 1200 Mg Caps (Omega-3 Fatty Acids) .... Two By Mouth Daily 7)  Niacin Flush Free 500 Mg Caps (Inositol Niacinate) .... One By Mouth Daily With The Aspirin 8)  Iron .... Daily 9)  Sertraline Hcl 100 Mg Tabs (Sertraline Hcl) .Marland Kitchen.. 1 Once Daily 10)  Zocor 40 Mg Tabs (Simvastatin) .... Take One Tablet Daily At Bedtime 11)  Rapaflo 8 Mg Caps (Silodosin) .... One By Mouth Daily  Allergies (verified): 1)  ! Tylenol  Past History:  Family History: Last updated: 07/26/2008 Family History of CAD Male 1st degree relative <50  died at 21 mother had a blood colt dueing open heart surgery in her 53's  Social History: Last updated: 07/26/2008 Former Smoker Married Alcohol use-no Drug use-no Worked at [employer].  Now does diesel repair work part time  Risk Factors: Smoking Status: quit (02/20/2009) Cigars/wk: 7 (02/20/2009) Passive Smoke Exposure: no (02/20/2009)  Past medical, surgical, family and social histories (including risk factors) reviewed, and no changes noted (except as noted below).  Past Medical History: Reviewed history from 07/26/2008 and no changes required. Current Problems:  CORONARY ARTERY DISEASE (ICD-414.00) HYPERTENSION (ICD-401.9) HYPERLIPIDEMIA (ICD-272.4) PRE-OPERATIVE CARDIOVASCULAR EXAMINATION (ICD-V72.81) SPINAL STENOSIS OF LUMBAR REGION (ICD-724.02) CEREBRAL ATHEROSCLEROSIS (ICD-437.0) ERECTILE DYSFUNCTION (ICD-302.72) PREVENTIVE HEALTH CARE (ICD-V70.0) BENIGN PROSTATIC HYPERTROPHY, WITH URINARY OBSTRUCTION (ICD-600.01) PNEUMONIA (ICD-486) SLEEP APNEA, OBSTRUCTIVE, MODERATE (ICD-327.23) SYMPTOM, NOCTURIA (ICD-788.43) FAMILY HISTORY OF CAD MALE 1ST DEGREE RELATIVE <50 (ICD-V17.3) PEPTIC ULCER DISEASE (ICD-533.90) LOW BACK PAIN (ICD-724.2) HEADACHE  (ICD-784.0)  Past Surgical History: Reviewed history from 07/26/2008 and no changes required. EGD-2004 Colonoscopy-2004 Cath-1999 Stent Lumbar laminectomy Lumbar fusion athroscopy left knee  Right shoulder arthroscopy   Right knee arthroscopy   Decompression of the median nerve, left wrist and hand.  Family History: Reviewed history from 07/26/2008 and no changes required. Family History of CAD Male 1st degree relative <50  died at 89 mother had a blood colt dueing open heart surgery in her 30's  Social History: Reviewed history from 07/26/2008 and no changes required. Former Smoker Married Alcohol use-no Drug use-no Worked at [employer].  Now does diesel repair work part time  Review of Systems  The patient denies anorexia, fever, weight loss, weight gain, vision loss, decreased hearing, hoarseness, chest pain, syncope, dyspnea on exertion, peripheral edema, prolonged cough, headaches, hemoptysis, abdominal pain, melena, hematochezia, severe indigestion/heartburn, hematuria, incontinence, genital sores, muscle weakness, suspicious skin lesions, transient blindness, difficulty walking, depression, unusual weight change, abnormal bleeding, enlarged lymph nodes, angioedema, and breast masses.    Physical Exam  General:  Well-developed,well-nourished,in no acute distress; alert,appropriate and cooperative throughout examination Head:  male-pattern balding.  normocephalic.   Eyes:  pupils equal  and pupils round.   Ears:  R ear normal and L ear normal.   Nose:  no external deformity and no nasal discharge.   Neck:  No deformities, masses, or tenderness noted. Lungs:  normal respiratory effort and no wheezes.   Heart:  normal rate and regular rhythm.   Abdomen:  soft, non-tender, and normal bowel sounds.   Msk:  normal ROM, lumbar lordosis, and SI joint tenderness.   Extremities:  trace left pedal edema and trace right pedal edema.   Neurologic:  alert & oriented X3.   sonolent   Impression & Recommendations:  Problem # 1:  HYPERTENSION (ICD-401.9) change off the metoprolol for bystollic for better control His updated medication list for this problem includes:    Bystolic 10 Mg Tabs (Nebivolol hcl) ..... One by mouth daily  BP today: 130/80 Prior BP: 148/78 (01/19/2009)  Prior 10 Yr Risk Heart Disease: N/A (11/24/2006)  Labs Reviewed: K+: 4.6 (07/04/2008) Creat: : 1.1 (07/04/2008)   Chol: 134 (03/28/2008)   HDL: 25.4 (03/28/2008)   LDL: DEL (03/28/2008)   TG: 228 (03/28/2008)  Problem # 2:  HEADACHE (ICD-784.0) mild flair in stress head aches, moniter and see if there is a relation to the rapiflow His updated medication list for this problem includes:    Bystolic 10 Mg Tabs (Nebivolol hcl) ..... One by mouth daily    Ms Contin 30 Mg Tb12 (Morphine sulfate) ..... One twice a day    Bayer Low Strength 81 Mg Tbec (Aspirin) ..... Once daily  Problem # 3:  HYPERLIPIDEMIA (ICD-272.4)  His updated medication list for this problem includes:    Zocor 40 Mg Tabs (Simvastatin) .Marland Kitchen... Take one tablet daily at bedtime  Labs Reviewed: SGOT: 22 (03/28/2008)   SGPT: 21 (03/28/2008)  Lipid Goals: Chol Goal: 200 (06/19/2007)   HDL Goal: 40 (06/19/2007)   LDL Goal: 100 (06/19/2007)   TG Goal: 150 (06/19/2007)  Prior 10 Yr Risk Heart Disease: N/A (11/24/2006)   HDL:25.4 (03/28/2008), 27.3 (09/18/2007)  LDL:DEL (03/28/2008), DEL (09/18/2007)  Chol:134 (03/28/2008), 153 (09/18/2007)  Trig:228 (03/28/2008), 259 (09/18/2007)  Problem # 4:  LOW BACK PAIN (ICD-724.2)  His updated medication list for this problem includes:    Ms Contin 30 Mg Tb12 (Morphine sulfate) ..... One twice a day    Bayer Low Strength 81 Mg Tbec (Aspirin) ..... Once daily  Discussed use of moist heat or ice, modified activities, medications, and stretching/strengthening exercises. Back care instructions given. To be seen in 2 weeks if no improvement; sooner if worsening of symptoms.    Complete Medication List: 1)  Bystolic 10 Mg Tabs (Nebivolol hcl) .... One by mouth daily 2)  Ms Contin 30 Mg Tb12 (Morphine sulfate) .... One twice a day 3)  Bayer Low Strength 81 Mg Tbec (Aspirin) .... Once daily 4)  Zegerid 40-1100 Mg Caps (Omeprazole-sodium bicarbonate) .... Once daily 5)  Amoxil 500 Mg Caps (Amoxicillin) .... 4 prior to dental work 6)  Fish Oil 1200 Mg Caps (Omega-3 fatty acids) .... Two by mouth daily 7)  Niacin Flush Free 500 Mg Caps (Inositol niacinate) .... One by mouth daily with the aspirin 8)  Iron  .... Daily 9)  Sertraline Hcl 100 Mg Tabs (Sertraline hcl) .Marland Kitchen.. 1 once daily 10)  Zocor 40 Mg Tabs (Simvastatin) .... Take one tablet daily at bedtime 11)  Rapaflo 8 Mg Caps (Silodosin) .... One by mouth daily  Hypertension Assessment/Plan:      The patient's hypertensive risk group is category C:  Target organ damage and/or diabetes.  Today's blood pressure is 130/80.  His blood pressure goal is < 140/90.  Lipid Assessment/Plan:      Based on NCEP/ATP III, the patient's risk factor category is "history of coronary disease, peripheral vascular disease, cerebrovascular disease, or aortic aneurysm".  The patient's lipid goals are as follows: Total cholesterol goal is 200; LDL cholesterol goal is 100; HDL cholesterol goal is 40; Triglyceride goal is 150.    Patient Instructions: 1)  Please schedule a follow-up appointment in 1 month.

## 2010-03-20 NOTE — Progress Notes (Signed)
Summary: Pt req script for MS Contin  Phone Note Refill Request Call back at Home Phone [phone] Message from:  Patient on September 13, 2009 4:07 PM  Refills Requested: Medication #1:  MS CONTIN 30 MG  TB12 one twice a day   Dosage confirmed as above?Dosage Confirmed   Supply Requested: 3 months  Method Requested: Pick up at Office Initial call taken by: Lucy Antigua,  September 13, 2009 4:06 PM    Prescriptions: MS CONTIN 30 MG  TB12 (MORPHINE SULFATE) one twice a day  #60 x 0   Entered by:   Willy Eddy, LPN   Authorized by:   Stacie Glaze MD   Signed by:   Willy Eddy, LPN on 78/29/5621   Method used:   Print then Give to Patient   RxID:   [id number] MS CONTIN 30 MG  TB12 (MORPHINE SULFATE) one twice a day  #60 x 0   Entered by:   Willy Eddy, LPN   Authorized by:   Stacie Glaze MD   Signed by:   Willy Eddy, LPN on 41/32/4401   Method used:   Print then Give to Patient   RxID:   [id number] MS CONTIN 30 MG  TB12 (MORPHINE SULFATE) one twice a day  #60 x 0   Entered by:   Willy Eddy, LPN   Authorized by:   Stacie Glaze MD   Signed by:   Willy Eddy, LPN on 59/56/3875   Method used:   Print then Give to Patient   RxID:   [id number]

## 2010-03-20 NOTE — Progress Notes (Signed)
Summary: ov quick  Phone Note Call from Patient Call back at [phone]   Caller: vm Reason for Call: Acute Illness Summary of Call: Ov for Left leg/shin bruised & swollen, warm to touch warmer than rest of body, hurting x 10 days. Thought I hit it on something, but apparently not because it's not getting any better. Initial call taken by: Rudy Jew, RN,  September 05, 2009 9:23 AM  Follow-up for Phone Call        ov with dr blyth Follow-up by: Willy Eddy, LPN,  September 05, 2009 9:42 AM

## 2010-03-20 NOTE — Progress Notes (Signed)
----   Converted from flag ---- ---- 09/05/2009 4:10 PM, Danise Edge MD wrote: Neysa Bonito please notify neg doppler ---- 09/05/2009 3:13 PM, Maxwell Caul wrote: Mr. [name] LLE duplex exam is negative for DVT. ------------------------------  Phone Note Outgoing Call   Summary of Call: I tried home number and its not working. I called work number and they stated pt was not at work today. I will try again in the morning.  Dr Abner Greenspan spoke with Dr Lovell Sheehan and Dr Lovell Sheehan would recommend ortho [facility]? Initial call taken by: Josph Macho RMA,  September 05, 2009 4:15 PM  Follow-up for Phone Call        Can you please notify pt that the doppler was neg for DVT Follow-up by: Josph Macho RMA,  September 06, 2009 9:27 AM  Additional Follow-up for Phone Call Additional follow up Details #1::        pt wife informed Additional Follow-up by: Willy Eddy, LPN,  September 06, 2009 9:56 AM

## 2010-03-20 NOTE — Letter (Signed)
Summary: [facility]  [facility]   Imported By: Maryln Gottron 03/07/2009 10:44:31  _____________________________________________________________________  External Attachment:    Type:   Image     Comment:   External Document

## 2010-03-20 NOTE — Assessment & Plan Note (Signed)
Summary: 4 MTH ROV // RS   Vital Signs:  Patient profile:   70 year old male Height:      72 inches Weight:      208 pounds BMI:     28.31 Temp:     98.2 degrees F oral Pulse rate:   76 / minute Resp:     14 per minute BP sitting:   142 / 84  (left arm)  Vitals Entered By: Willy Eddy, LPN (September 29, 2009 1:22 PM) CC: roa- revision of tkr by dr applington sch eduled for 9-1, Hypertension Management Is Patient Diabetic? No Pain Assessment Patient in pain? yes     Location: knee Intensity: 6 Type: aching Onset of pain  prothesis loose Comments needed help with care   Primary Care Provider:  Stacie Glaze MD  CC:  roa- revision of tkr by dr applington sch eduled for 9-1 and Hypertension Management.  History of Present Illness: pain in the knee the prosthesis is loos and the plan  is to replace the knee sept first the pain pills for the back  do not help the knee his back pain is stable the ms contin has been refilled  fro 3 months he does not have any breath though pain meds he is using NSAID ( advil and aleve) multiple AK on face bleed with shaving and do not heal  Hypertension History:      He denies headache, chest pain, palpitations, dyspnea with exertion, orthopnea, PND, peripheral edema, visual symptoms, neurologic problems, syncope, and side effects from treatment.  pain and nsaid used.        Positive major cardiovascular risk factors include male age 85 years old or older, hyperlipidemia, and hypertension.  Negative major cardiovascular risk factors include non-tobacco-user status.        Positive history for target organ damage include ASHD (either angina/prior MI/prior CABG).  Further assessment for target organ damage reveals no history of stroke/TIA or peripheral vascular disease.     Preventive Screening-Counseling & Management  Alcohol-Tobacco     Smoking Status: quit     Year Started: 1992     Year Quit: 1998     Cigars/week: 7     Passive  Smoke Exposure: no  Problems Prior to Update: 1)  Calf Pain, Left  (ICD-729.5) 2)  Sprain&strain of Unspecified Site of Knee&leg  (ICD-844.9) 3)  Melanoma, Spreading, Superficial  (ICD-172.9) 4)  Other Idiopathic Peripheral Autonomic Neuropathy  (ICD-337.09) 5)  Coronary Artery Disease  (ICD-414.00) 6)  Hypertension  (ICD-401.9) 7)  Hyperlipidemia  (ICD-272.4) 8)  Pre-operative Cardiovascular Examination  (ICD-V72.81) 9)  Spinal Stenosis of Lumbar Region  (ICD-724.02) 10)  Cerebral Atherosclerosis  (ICD-437.0) 11)  Erectile Dysfunction  (ICD-302.72) 12)  Preventive Health Care  (ICD-V70.0) 13)  Benign Prostatic Hypertrophy, With Urinary Obstruction  (ICD-600.01) 14)  Pneumonia  (ICD-486) 15)  Sleep Apnea, Obstructive, Moderate  (ICD-327.23) 16)  Symptom, Nocturia  (ICD-788.43) 17)  Family History of Cad Male 1st Degree Relative <50  (ICD-V17.3) 18)  Peptic Ulcer Disease  (ICD-533.90) 19)  Low Back Pain  (ICD-724.2) 20)  Headache  (ICD-784.0)  Current Problems (verified): 1)  Calf Pain, Left  (ICD-729.5) 2)  Sprain&strain of Unspecified Site of Knee&leg  (ICD-844.9) 3)  Melanoma, Spreading, Superficial  (ICD-172.9) 4)  Other Idiopathic Peripheral Autonomic Neuropathy  (ICD-337.09) 5)  Coronary Artery Disease  (ICD-414.00) 6)  Hypertension  (ICD-401.9) 7)  Hyperlipidemia  (ICD-272.4) 8)  Pre-operative Cardiovascular Examination  (ICD-V72.81)  9)  Spinal Stenosis of Lumbar Region  (ICD-724.02) 10)  Cerebral Atherosclerosis  (ICD-437.0) 11)  Erectile Dysfunction  (ICD-302.72) 12)  Preventive Health Care  (ICD-V70.0) 13)  Benign Prostatic Hypertrophy, With Urinary Obstruction  (ICD-600.01) 14)  Pneumonia  (ICD-486) 15)  Sleep Apnea, Obstructive, Moderate  (ICD-327.23) 16)  Symptom, Nocturia  (ICD-788.43) 17)  Family History of Cad Male 1st Degree Relative <50  (ICD-V17.3) 18)  Peptic Ulcer Disease  (ICD-533.90) 19)  Low Back Pain  (ICD-724.2) 20)  Headache   (ICD-784.0)  Medications Prior to Update: 1)  Bystolic 10 Mg Tabs (Nebivolol Hcl) .... One By Mouth Daily 2)  Ms Contin 30 Mg  Tb12 (Morphine Sulfate) .... One Twice A Day 3)  Bayer Low Strength 81 Mg  Tbec (Aspirin) .... Once Daily 4)  Zegerid 40-1100 Mg  Caps (Omeprazole-Sodium Bicarbonate) .... Once Daily 5)  Amoxil 500 Mg Caps (Amoxicillin) .... 4 Prior To Dental Work 6)  Fish Oil 1200 Mg Caps (Omega-3 Fatty Acids) .... Two By Mouth Daily 7)  Niacin Flush Free 500 Mg Caps (Inositol Niacinate) .... One By Mouth Daily With The Aspirin 8)  Iron .... Daily 9)  Sertraline Hcl 100 Mg Tabs (Sertraline Hcl) .Marland Kitchen.. 1 Once Daily 10)  Zocor 40 Mg Tabs (Simvastatin) .... Take One Tablet Daily At Bedtime 11)  Rapaflo 8 Mg Caps (Silodosin) .... One By Mouth Daily  Current Medications (verified): 1)  Bystolic 10 Mg Tabs (Nebivolol Hcl) .... One By Mouth Daily 2)  Ms Contin 30 Mg  Tb12 (Morphine Sulfate) .... One Twice A Day 3)  Bayer Low Strength 81 Mg  Tbec (Aspirin) .... Once Daily 4)  Zegerid 40-1100 Mg  Caps (Omeprazole-Sodium Bicarbonate) .... Once Daily 5)  Amoxil 500 Mg Caps (Amoxicillin) .... 4 Prior To Dental Work 6)  Fish Oil 1200 Mg Caps (Omega-3 Fatty Acids) .... Two By Mouth Daily 7)  Niacin Flush Free 500 Mg Caps (Inositol Niacinate) .... One By Mouth Daily With The Aspirin 8)  Iron .... Daily 9)  Sertraline Hcl 100 Mg Tabs (Sertraline Hcl) .Marland Kitchen.. 1 Once Daily 10)  Zocor 40 Mg Tabs (Simvastatin) .... Take One Tablet Daily At Bedtime 11)  Morphine Sulfate 15 Mg Tabs (Morphine Sulfate) .... One By Mouth As Needed Pain Every 6 Hours  Allergies (verified): 1)  ! Tylenol  Past History:  Family History: Last updated: 07/26/2008 Family History of CAD Male 1st degree relative <50  died at 58 mother had a blood colt dueing open heart surgery in her 43's  Social History: Last updated: 07/26/2008 Former Smoker Married Alcohol use-no Drug use-no Worked at [employer].  Now does diesel repair  work part time  Risk Factors: Smoking Status: quit (09/29/2009) Cigars/wk: 7 (09/29/2009) Passive Smoke Exposure: no (09/29/2009)  Past medical, surgical, family and social histories (including risk factors) reviewed, and no changes noted (except as noted below).  Past Medical History: Reviewed history from 07/26/2008 and no changes required. Current Problems:  CORONARY ARTERY DISEASE (ICD-414.00) HYPERTENSION (ICD-401.9) HYPERLIPIDEMIA (ICD-272.4) PRE-OPERATIVE CARDIOVASCULAR EXAMINATION (ICD-V72.81) SPINAL STENOSIS OF LUMBAR REGION (ICD-724.02) CEREBRAL ATHEROSCLEROSIS (ICD-437.0) ERECTILE DYSFUNCTION (ICD-302.72) PREVENTIVE HEALTH CARE (ICD-V70.0) BENIGN PROSTATIC HYPERTROPHY, WITH URINARY OBSTRUCTION (ICD-600.01) PNEUMONIA (ICD-486) SLEEP APNEA, OBSTRUCTIVE, MODERATE (ICD-327.23) SYMPTOM, NOCTURIA (ICD-788.43) FAMILY HISTORY OF CAD MALE 1ST DEGREE RELATIVE <50 (ICD-V17.3) PEPTIC ULCER DISEASE (ICD-533.90) LOW BACK PAIN (ICD-724.2) HEADACHE (ICD-784.0)  Past Surgical History: Reviewed history from 07/26/2008 and no changes required. EGD-2004 Colonoscopy-2004 Cath-1999 Stent Lumbar laminectomy Lumbar fusion athroscopy left knee  Right shoulder arthroscopy  Right knee arthroscopy   Decompression of the median nerve, left wrist and hand.  Family History: Reviewed history from 07/26/2008 and no changes required. Family History of CAD Male 1st degree relative <50  died at 32 mother had a blood colt dueing open heart surgery in her 17's  Social History: Reviewed history from 07/26/2008 and no changes required. Former Smoker Married Alcohol use-no Drug use-no Worked at [employer].  Now does diesel repair work part time  Review of Systems       The patient complains of severe indigestion/heartburn and suspicious skin lesions.  The patient denies anorexia, fever, weight loss, weight gain, vision loss, decreased hearing, hoarseness, chest pain, syncope, dyspnea on  exertion, peripheral edema, prolonged cough, headaches, hemoptysis, abdominal pain, melena, hematochezia, hematuria, incontinence, genital sores, muscle weakness, transient blindness, difficulty walking, depression, unusual weight change, abnormal bleeding, enlarged lymph nodes, angioedema, breast masses, and testicular masses.    Physical Exam  General:  alert and disheveled.   Head:  Normocephalic and atraumatic without obvious abnormalities. No apparent alopecia or balding. Eyes:  pupils equal and pupils round.   Ears:  R ear normal and L ear normal.   Nose:  ak at base of right nare Mouth:  pharynx pink and moist and no erythema.   Neck:  No deformities, masses, or tenderness noted. Lungs:  normal respiratory effort and no wheezes.   Heart:  normal rate and regular rhythm.   Abdomen:  soft and non-tender.   Msk:  normal ROM, lumbar lordosis, and SI joint tenderness.   Skin:  AK on face Cervical Nodes:  No lymphadenopathy noted Axillary Nodes:  No palpable lymphadenopathy Psych:  Cognition and judgment appear intact. Alert and cooperative with normal attention span and concentration. No apparent delusions, illusions, hallucinations   Impression & Recommendations:  Problem # 1:  LOW BACK PAIN (ICD-724.2)  His updated medication list for this problem includes:    Ms Contin 30 Mg Tb12 (Morphine sulfate) ..... One twice a day    Bayer Low Strength 81 Mg Tbec (Aspirin) ..... Once daily    Morphine Sulfate 15 Mg Tabs (Morphine sulfate) ..... One by mouth as needed pain every 6 hours  Discussed use of moist heat or ice, modified activities, medications, and stretching/strengthening exercises. Back care instructions given. To be seen in 2 weeks if no improvement; sooner if worsening of symptoms.   Problem # 2:  KNEE PAIN, ACUTE (ICD-719.46) surgery planned spet 1 the patient has been taking excessive nsaids due to pain in the loose prosthesis His updated medication list for this problem  includes:    Ms Contin 30 Mg Tb12 (Morphine sulfate) ..... One twice a day    Bayer Low Strength 81 Mg Tbec (Aspirin) ..... Once daily    Morphine Sulfate 15 Mg Tabs (Morphine sulfate) ..... One by mouth as needed pain every 6 hours  Discussed strengthening exercises, use of ice or heat, and medications.   Problem # 3:  ACTINIC KERATOSIS (ICD-702.0)  on face the lesions were identified as a  AK's on face ( four)        and 40 seconds of cryotherapy with the liguid nitrogen gun was apllied to the site. The pt tolerated the procedure and post procedure care was discussed locations right and left cheeks  Orders: Cryotherapy/Destruction benign or premalignant lesion (1st lesion)  (17000)  Complete Medication List: 1)  Bystolic 10 Mg Tabs (Nebivolol hcl) .... One by mouth daily 2)  Ms Contin 30 Mg  Tb12 (Morphine sulfate) .... One twice a day 3)  Bayer Low Strength 81 Mg Tbec (Aspirin) .... Once daily 4)  Zegerid 40-1100 Mg Caps (Omeprazole-sodium bicarbonate) .... Once daily 5)  Amoxil 500 Mg Caps (Amoxicillin) .... 4 prior to dental work 6)  Fish Oil 1200 Mg Caps (Omega-3 fatty acids) .... Two by mouth daily 7)  Niacin Flush Free 500 Mg Caps (Inositol niacinate) .... One by mouth daily with the aspirin 8)  Iron  .... Daily 9)  Sertraline Hcl 100 Mg Tabs (Sertraline hcl) .Marland Kitchen.. 1 once daily 10)  Zocor 40 Mg Tabs (Simvastatin) .... Take one tablet daily at bedtime 11)  Morphine Sulfate 15 Mg Tabs (Morphine sulfate) .... One by mouth as needed pain every 6 hours  Hypertension Assessment/Plan:      The patient's hypertensive risk group is category C: Target organ damage and/or diabetes.  Today's blood pressure is 142/84.  His blood pressure goal is < 140/90.  Patient Instructions: 1)  Please schedule a follow-up appointment in 2 months. Prescriptions: MORPHINE SULFATE 15 MG TABS (MORPHINE SULFATE) one by mouth as needed pain every 6 hours  #60 x 0   Entered and Authorized by:   Stacie Glaze MD   Signed by:   Stacie Glaze MD on 09/29/2009   Method used:   Print then Give to Patient   RxID:   [id number]

## 2010-03-20 NOTE — Progress Notes (Signed)
Summary: BP reading  Phone Note Call from Patient   Caller: Patient Reason for Call: Referral Summary of Call: 184/96  BP [phone] Wants to see Dr. Lovell Sheehan for elevated BP reading today. Initial call taken by: Lynann Beaver CMA,  March 02, 2009 10:12 AM  Follow-up for Phone Call        per call- a- nurse triage call report att 03-01-09 at 6:43pm--"wife/[name]: calling to report confusion and wandering around house, lightheaded with bp at 195/97, 911 disposition obtained, advised to go to er at [facility] after calling 911 per protocol, wife agrees"-reason being sudden change in mental status Follow-up by: Willy Eddy, LPN,  March 02, 2009 10:41 AM  Additional Follow-up for Phone Call Additional follow up Details #1::        wife called this am wanting ov ; to discuss bp-- I called wife and pt DIDNOT go to er  as instructed- bp still up with mental status change.  Per dr Olena Leatherwood to [place]- wife informed and pt instructed to do that-pt states he will go=-- [place] called and info given to [facility] Additional Follow-up by: Willy Eddy, LPN,  March 02, 2009 10:43 AM    MLI_PICT  Sketch(1)

## 2010-03-20 NOTE — Letter (Signed)
Summary: [facility]  [facility]   Imported By: Maryln Gottron 04/06/2009 13:39:44  _____________________________________________________________________  External Attachment:    Type:   Image     Comment:   External Document

## 2010-03-20 NOTE — Progress Notes (Signed)
Summary: MS Contin  Phone Note Call from Patient Call back at Work Phone [phone]   Caller: Patient Call For: Stacie Glaze MD Summary of Call: Low back pain has increased since recent knee surgery (7 weeks ago) MS Contin is not helping as much and he is taking more.  Is going to run out early and wants to know what to do? Initial call taken by: Lynann Beaver CMA AAMA,  December 26, 2009 9:31 AM  Follow-up for Phone Call        concerned about increased MS contin use would rather go t a longer acting medication avinza ( 24 hour ms contin ) 90 on by mouth daily may pick up rx ( changed in list)  Follow-up by: Stacie Glaze MD,  December 26, 2009 1:44 PM  Additional Follow-up for Phone Call Additional follow up Details #1::        LMTCB Additional Follow-up by: [facility] CMA AAMA,  December 26, 2009 2:13 PM    New/Updated Medications: AVINZA 90 MG XR24H-CAP (MORPHINE SULFATE BEADS) one by mouth daily Prescriptions: AVINZA 90 MG XR24H-CAP (MORPHINE SULFATE BEADS) one by mouth daily  #90 x 0   Entered by:   Lynann Beaver CMA AAMA   Authorized by:   Stacie Glaze MD   Signed by:   Lynann Beaver CMA AAMA on 12/26/2009   Method used:   Print then Give to Patient   RxID:   [id number]  Left message to pick up prescription.

## 2010-03-20 NOTE — Assessment & Plan Note (Signed)
Summary: 2 month fup//ccm   Vital Signs:  Patient profile:   70 year old male Height:      72 inches Weight:      188 pounds BMI:     25.59 Temp:     98.2 degrees F oral Pulse rate:   76 / minute Resp:     14 per minute BP sitting:   134 / 80  (left arm)  Vitals Entered By: Willy Eddy, LPN (December 01, 2009 10:59 AM) CC: roa- 6 weeks post tkr- had blood while in hosptial and had reaction, Hypertension Management Is Patient Diabetic? No   Primary Care Provider:  Stacie Glaze MD  CC:  roa- 6 weeks post tkr- had blood while in hosptial and had reaction and Hypertension Management.  History of Present Illness: third knee surgery ( Dr Leslee Home) and was at rehab at [facility] for several weeks due to the pain lost weight had a transfusion reaction in hospital surgical and hospital reports reviewed   Hypertension History:      He denies headache, chest pain, palpitations, dyspnea with exertion, orthopnea, PND, peripheral edema, visual symptoms, neurologic problems, syncope, and side effects from treatment.        Positive major cardiovascular risk factors include male age 23 years old or older, hyperlipidemia, and hypertension.  Negative major cardiovascular risk factors include non-tobacco-user status.        Positive history for target organ damage include ASHD (either angina/prior MI/prior CABG).  Further assessment for target organ damage reveals no history of stroke/TIA or peripheral vascular disease.     Preventive Screening-Counseling & Management  Alcohol-Tobacco     Smoking Status: quit     Year Started: 1992     Year Quit: 1998     Cigars/week: 7     Passive Smoke Exposure: no     Tobacco Counseling: to remain off tobacco products  Problems Prior to Update: 1)  Actinic Keratosis  (ICD-702.0) 2)  Knee Pain, Acute  (ICD-719.46) 3)  Calf Pain, Left  (ICD-729.5) 4)  Sprain&strain of Unspecified Site of Knee&leg  (ICD-844.9) 5)  Melanoma, Spreading, Superficial   (ICD-172.9) 6)  Other Idiopathic Peripheral Autonomic Neuropathy  (ICD-337.09) 7)  Coronary Artery Disease  (ICD-414.00) 8)  Hypertension  (ICD-401.9) 9)  Hyperlipidemia  (ICD-272.4) 10)  Pre-operative Cardiovascular Examination  (ICD-V72.81) 11)  Spinal Stenosis of Lumbar Region  (ICD-724.02) 12)  Cerebral Atherosclerosis  (ICD-437.0) 13)  Erectile Dysfunction  (ICD-302.72) 14)  Preventive Health Care  (ICD-V70.0) 15)  Benign Prostatic Hypertrophy, With Urinary Obstruction  (ICD-600.01) 16)  Pneumonia  (ICD-486) 17)  Sleep Apnea, Obstructive, Moderate  (ICD-327.23) 18)  Symptom, Nocturia  (ICD-788.43) 19)  Family History of Cad Male 1st Degree Relative <50  (ICD-V17.3) 20)  Peptic Ulcer Disease  (ICD-533.90) 21)  Low Back Pain  (ICD-724.2) 22)  Headache  (ICD-784.0)  Current Problems (verified): 1)  Actinic Keratosis  (ICD-702.0) 2)  Knee Pain, Acute  (ICD-719.46) 3)  Calf Pain, Left  (ICD-729.5) 4)  Sprain&strain of Unspecified Site of Knee&leg  (ICD-844.9) 5)  Melanoma, Spreading, Superficial  (ICD-172.9) 6)  Other Idiopathic Peripheral Autonomic Neuropathy  (ICD-337.09) 7)  Coronary Artery Disease  (ICD-414.00) 8)  Hypertension  (ICD-401.9) 9)  Hyperlipidemia  (ICD-272.4) 10)  Pre-operative Cardiovascular Examination  (ICD-V72.81) 11)  Spinal Stenosis of Lumbar Region  (ICD-724.02) 12)  Cerebral Atherosclerosis  (ICD-437.0) 13)  Erectile Dysfunction  (ICD-302.72) 14)  Preventive Health Care  (ICD-V70.0) 15)  Benign Prostatic Hypertrophy, With  Urinary Obstruction  (ICD-600.01) 16)  Pneumonia  (ICD-486) 17)  Sleep Apnea, Obstructive, Moderate  (ICD-327.23) 18)  Symptom, Nocturia  (ICD-788.43) 19)  Family History of Cad Male 1st Degree Relative <50  (ICD-V17.3) 20)  Peptic Ulcer Disease  (ICD-533.90) 21)  Low Back Pain  (ICD-724.2) 22)  Headache  (ICD-784.0)  Medications Prior to Update: 1)  Bystolic 10 Mg Tabs (Nebivolol Hcl) .... One By Mouth Daily 2)  Ms Contin 30 Mg   Tb12 (Morphine Sulfate) .... One Twice A Day 3)  Bayer Low Strength 81 Mg  Tbec (Aspirin) .... Once Daily 4)  Zegerid 40-1100 Mg  Caps (Omeprazole-Sodium Bicarbonate) .... Once Daily 5)  Amoxil 500 Mg Caps (Amoxicillin) .... 4 Prior To Dental Work 6)  Fish Oil 1200 Mg Caps (Omega-3 Fatty Acids) .... Two By Mouth Daily 7)  Niacin Flush Free 500 Mg Caps (Inositol Niacinate) .... One By Mouth Daily With The Aspirin 8)  Iron .... Daily 9)  Sertraline Hcl 100 Mg Tabs (Sertraline Hcl) .Marland Kitchen.. 1 Once Daily 10)  Zocor 40 Mg Tabs (Simvastatin) .... Take One Tablet Daily At Bedtime 11)  Morphine Sulfate 15 Mg Tabs (Morphine Sulfate) .... One By Mouth As Needed Pain Every 6 Hours  Current Medications (verified): 1)  Bystolic 10 Mg Tabs (Nebivolol Hcl) .... One By Mouth Daily 2)  Ms Contin 30 Mg  Tb12 (Morphine Sulfate) .... One Twice A Day 3)  Bayer Low Strength 81 Mg  Tbec (Aspirin) .... Once Daily 4)  Zegerid 40-1100 Mg  Caps (Omeprazole-Sodium Bicarbonate) .... Once Daily 5)  Amoxil 500 Mg Caps (Amoxicillin) .... 4 Prior To Dental Work 6)  Fish Oil 1200 Mg Caps (Omega-3 Fatty Acids) .... Two By Mouth Daily 7)  Niacin Flush Free 500 Mg Caps (Inositol Niacinate) .... One By Mouth Daily With The Aspirin 8)  Iron .... Daily 9)  Sertraline Hcl 100 Mg Tabs (Sertraline Hcl) .Marland Kitchen.. 1 Once Daily 10)  Zocor 40 Mg Tabs (Simvastatin) .... Take One Tablet Daily At Bedtime  Allergies (verified): 1)  ! Tylenol  Past History:  Family History: Last updated: 07/26/2008 Family History of CAD Male 1st degree relative <50  died at 70 mother had a blood colt dueing open heart surgery in her 47's  Social History: Last updated: 07/26/2008 Former Smoker Married Alcohol use-no Drug use-no Worked at [employer].  Now does diesel repair work part time  Risk Factors: Smoking Status: quit (12/01/2009) Cigars/wk: 7 (12/01/2009) Passive Smoke Exposure: no (12/01/2009)  Past medical, surgical, family and social histories  (including risk factors) reviewed, and no changes noted (except as noted below).  Past Medical History: Reviewed history from 07/26/2008 and no changes required. Current Problems:  CORONARY ARTERY DISEASE (ICD-414.00) HYPERTENSION (ICD-401.9) HYPERLIPIDEMIA (ICD-272.4) PRE-OPERATIVE CARDIOVASCULAR EXAMINATION (ICD-V72.81) SPINAL STENOSIS OF LUMBAR REGION (ICD-724.02) CEREBRAL ATHEROSCLEROSIS (ICD-437.0) ERECTILE DYSFUNCTION (ICD-302.72) PREVENTIVE HEALTH CARE (ICD-V70.0) BENIGN PROSTATIC HYPERTROPHY, WITH URINARY OBSTRUCTION (ICD-600.01) PNEUMONIA (ICD-486) SLEEP APNEA, OBSTRUCTIVE, MODERATE (ICD-327.23) SYMPTOM, NOCTURIA (ICD-788.43) FAMILY HISTORY OF CAD MALE 1ST DEGREE RELATIVE <50 (ICD-V17.3) PEPTIC ULCER DISEASE (ICD-533.90) LOW BACK PAIN (ICD-724.2) HEADACHE (ICD-784.0)  Past Surgical History: EGD-2004 Colonoscopy-2004 Cath-1999 Stent Lumbar laminectomy Lumbar fusion athroscopy left knee  Right shoulder arthroscopy   Right knee arthroscopy   Decompression of the median nerve, left wrist and hand. revision of TKR on left 2011  Family History: Reviewed history from 07/26/2008 and no changes required. Family History of CAD Male 1st degree relative <50  died at 104 mother had a blood colt dueing open heart  surgery in her 56's  Social History: Reviewed history from 07/26/2008 and no changes required. Former Smoker Married Alcohol use-no Drug use-no Worked at [employer].  Now does diesel repair work part time  Review of Systems  The patient denies anorexia, fever, weight loss, weight gain, vision loss, decreased hearing, hoarseness, chest pain, syncope, dyspnea on exertion, peripheral edema, prolonged cough, headaches, hemoptysis, abdominal pain, melena, hematochezia, severe indigestion/heartburn, hematuria, incontinence, genital sores, muscle weakness, suspicious skin lesions, transient blindness, difficulty walking, depression, unusual weight change, abnormal bleeding,  enlarged lymph nodes, angioedema, and breast masses.    Physical Exam  General:  alert and disheveled.   Head:  Normocephalic and atraumatic without obvious abnormalities. No apparent alopecia or balding. Eyes:  pupils equal and pupils round.   Ears:  R ear normal and L ear normal.   Nose:  no external deformity and no nasal discharge.   Mouth:  pharynx pink and moist and no erythema.   Neck:  No deformities, masses, or tenderness noted. Lungs:  normal respiratory effort and no wheezes.   Heart:  normal rate and regular rhythm.     Impression & Recommendations:  Problem # 1:  HYPERTENSION (ICD-401.9)  His updated medication list for this problem includes:    Bystolic 10 Mg Tabs (Nebivolol hcl) ..... One by mouth daily  BP today: 134/80 Prior BP: 142/84 (09/29/2009)  Prior 10 Yr Risk Heart Disease: N/A (11/24/2006)  Labs Reviewed: K+: 4.3 (05/29/2009) Creat: : 1.3 (05/29/2009)   Chol: 119 (05/29/2009)   HDL: 30.10 (05/29/2009)   LDL: DEL (03/28/2008)   TG: 228 (03/28/2008)  Problem # 2:  HYPERLIPIDEMIA (ICD-272.4)  His updated medication list for this problem includes:    Zocor 40 Mg Tabs (Simvastatin) .Marland Kitchen... Take one tablet daily at bedtime  Labs Reviewed: SGOT: 22 (03/28/2008)   SGPT: 21 (03/28/2008)  Lipid Goals: Chol Goal: 200 (06/19/2007)   HDL Goal: 40 (06/19/2007)   LDL Goal: 100 (06/19/2007)   TG Goal: 150 (06/19/2007)  Prior 10 Yr Risk Heart Disease: N/A (11/24/2006)   HDL:30.10 (05/29/2009), 25.4 (03/28/2008)  LDL:DEL (03/28/2008), DEL (09/18/2007)  Chol:119 (05/29/2009), 134 (03/28/2008)  Trig:228 (03/28/2008), 259 (09/18/2007)  Problem # 3:  BENIGN PROSTATIC HYPERTROPHY, WITH URINARY OBSTRUCTION (ICD-600.01)  PSA: 0.22 (12/19/2008)   PSA Free: 0.2 (06/16/2006)     Problem # 4:  LOW BACK PAIN (ICD-724.2)  The following medications were removed from the medication list:    Morphine Sulfate 15 Mg Tabs (Morphine sulfate) ..... One by mouth as needed pain  every 6 hours His updated medication list for this problem includes:    Ms Contin 30 Mg Tb12 (Morphine sulfate) ..... One twice a day    Bayer Low Strength 81 Mg Tbec (Aspirin) ..... Once daily  Discussed use of moist heat or ice, modified activities, medications, and stretching/strengthening exercises. Back care instructions given. To be seen in 2 weeks if no improvement; sooner if worsening of symptoms.   Complete Medication List: 1)  Bystolic 10 Mg Tabs (Nebivolol hcl) .... One by mouth daily 2)  Ms Contin 30 Mg Tb12 (Morphine sulfate) .... One twice a day 3)  Bayer Low Strength 81 Mg Tbec (Aspirin) .... Once daily 4)  Zegerid 40-1100 Mg Caps (Omeprazole-sodium bicarbonate) .... Once daily 5)  Amoxil 500 Mg Caps (Amoxicillin) .... 4 prior to dental work 6)  Fish Oil 1200 Mg Caps (Omega-3 fatty acids) .... Two by mouth daily 7)  Niacin Flush Free 500 Mg Caps (Inositol niacinate) .... One by  mouth daily with the aspirin 8)  Iron  .... Daily 9)  Sertraline Hcl 100 Mg Tabs (Sertraline hcl) .Marland Kitchen.. 1 once daily 10)  Zocor 40 Mg Tabs (Simvastatin) .... Take one tablet daily at bedtime  Hypertension Assessment/Plan:      The patient's hypertensive risk group is category C: Target organ damage and/or diabetes.  Today's blood pressure is 134/80.  His blood pressure goal is < 140/90.  Patient Instructions: 1)  Please schedule a follow-up appointment in 3 months. 2)  CPX Prescriptions: MS CONTIN 30 MG  TB12 (MORPHINE SULFATE) one twice a day  #60 x 0   Entered and Authorized by:   Stacie Glaze MD   Signed by:   Stacie Glaze MD on 12/01/2009   Method used:   Print then Give to Patient   RxID:   [id number] MS CONTIN 30 MG  TB12 (MORPHINE SULFATE) one twice a day  #60 x 0   Entered and Authorized by:   Stacie Glaze MD   Signed by:   Stacie Glaze MD on 12/01/2009   Method used:   Print then Give to Patient   RxID:   [id number] MS CONTIN 30 MG  TB12 (MORPHINE SULFATE) one  twice a day  #60 x 0   Entered and Authorized by:   Stacie Glaze MD   Signed by:   Stacie Glaze MD on 12/01/2009   Method used:   Print then Give to Patient   RxID:   [id number]

## 2010-03-20 NOTE — Progress Notes (Signed)
  Phone Note Call from Patient   Summary of Call: pt continues to c/o lower knee hurting and asking what he should do.  talked with pt and ssaw dr aoppleington and he said no loosening of tibail component- having bone scane==was told to ice it and stay off of it as mich as possible until he gets bone scan Initial call taken by: Willy Eddy, LPN,  September 08, 2009 8:44 AM

## 2010-03-20 NOTE — Progress Notes (Signed)
  Phone Note Call from Patient   Caller: Patient Call For: Stacie Glaze MD Reason for Call: Referral Summary of Call: Avinza is not helping pt at all and it 1200 dollars.  He sees no relief at all on this new drug.  Suggestions? [phone] Initial call taken by: Lynann Beaver CMA AAMA,  January 10, 2010 11:57 AM  Follow-up for Phone Call        per dr Yvonne Kendall try ms contin 60 mg two times a day but he cannot take an ymore than that Follow-up by: Willy Eddy, LPN,  January 10, 2010 3:30 PM    New/Updated Medications: MS CONTIN 60 MG XR12H-TAB (MORPHINE SULFATE) One by mouth two times a day.  No more than 2. Prescriptions: MS CONTIN 60 MG XR12H-TAB (MORPHINE SULFATE) One by mouth two times a day.  No more than 2.  #60 x 0   Entered by:   Lynann Beaver CMA AAMA   Authorized by:   Stacie Glaze MD   Signed by:   Lynann Beaver CMA AAMA on 01/10/2010   Method used:   Print then Give to Patient   RxID:   [id number]  Pt's wife notified and pt will pick it up.  He was not at home.

## 2010-03-20 NOTE — Assessment & Plan Note (Signed)
Summary: f1y   Visit Type:  1 yr f/u Primary [name]:  Stacie Glaze MD  CC:  leg pains when walking gets better w/rest....sob at times...denies any cp or edema.  History of Present Illness: [name] is 70 years old and return for management of CAD. In 1997 he had a stent placed to the circumflex artery and in 1999 he had a stent placed to the right coronary artery. He had nonobstructive disease at catheterization in May of 2010.  He has had no recent chest pain shortness of breath or palpitations.  His other medical problems include hypertension, hyperlipidemia, and obstructive sleep apnea. He also has lumbosacral spine disease. he had third knee replacement surgery in September and is finally doing better from this.  His last lipid profile was in April 2011 his LDL was 60.  Current Medications (verified): 1)  Bystolic 10 Mg Tabs (Nebivolol Hcl) .... One By Mouth Daily 2)  Avinza 90 Mg Xr24h-Cap (Morphine Sulfate Beads) .... One By Mouth Daily 3)  Bayer Low Strength 81 Mg  Tbec (Aspirin) .... Once Daily 4)  Zegerid 40-1100 Mg  Caps (Omeprazole-Sodium Bicarbonate) .... Once Daily 5)  Amoxil 500 Mg Caps (Amoxicillin) .... 4 Prior To Dental Work 6)  Iron 325 (65 Fe) Mg Tabs (Ferrous Sulfate) .Marland Kitchen.. 1 Tab Once Daily 7)  Sertraline Hcl 100 Mg Tabs (Sertraline Hcl) .Marland Kitchen.. 1 Once Daily 8)  Zocor 40 Mg Tabs (Simvastatin) .... Take One Tablet Daily At Bedtime 9)  Ms Contin 60 Mg Xr12h-Tab (Morphine Sulfate) .... One By Mouth Two Times A Day.  No More Than 2. 10)  Delsym 30 Mg/40ml Lqcr (Dextromethorphan Polistirex) .... As Directed 11)  Zithromax Z-Pak 250 Mg Tabs (Azithromycin) .... As Directed  Allergies: 1)  ! Tylenol  Past History:  Past Medical History: Reviewed history from 07/26/2008 and no changes required. Current Problems:  CORONARY ARTERY DISEASE (ICD-414.00) HYPERTENSION (ICD-401.9) HYPERLIPIDEMIA (ICD-272.4) PRE-OPERATIVE CARDIOVASCULAR EXAMINATION (ICD-V72.81) SPINAL  STENOSIS OF LUMBAR REGION (ICD-724.02) CEREBRAL ATHEROSCLEROSIS (ICD-437.0) ERECTILE DYSFUNCTION (ICD-302.72) PREVENTIVE HEALTH CARE (ICD-V70.0) BENIGN PROSTATIC HYPERTROPHY, WITH URINARY OBSTRUCTION (ICD-600.01) PNEUMONIA (ICD-486) SLEEP APNEA, OBSTRUCTIVE, MODERATE (ICD-327.23) SYMPTOM, NOCTURIA (ICD-788.43) FAMILY HISTORY OF CAD MALE 1ST DEGREE RELATIVE <50 (ICD-V17.3) PEPTIC ULCER DISEASE (ICD-533.90) LOW BACK PAIN (ICD-724.2) HEADACHE (ICD-784.0)  Review of Systems       ROS is negative except as outlined in HPI.   Vital Signs:  Patient profile:   70 year old male Height:      72 inches Weight:      192 pounds Pulse rate:   63 / minute Pulse rhythm:   irregular BP sitting:   124 / 76  (left arm) Cuff size:   large  Vitals Entered By: Danielle Rankin, CMA (January 19, 2010 9:42 AM)  Physical Exam  Additional Exam:  Gen. Well-nourished, in no distress   Neck: No JVD, thyroid not enlarged, no carotid bruits Lungs: No tachypnea, clear without rales, rhonchi or wheezes Cardiovascular: Rhythm regular, PMI not displaced,  heart sounds  normal, no murmurs or gallops, no peripheral edema, pulses normal in all 4 extremities. Abdomen: BS normal, abdomen soft and non-tender without masses or organomegaly, no hepatosplenomegaly. MS: No deformities, no cyanosis or clubbing   Neuro:  No focal sns   Skin:  no lesions    Impression & Recommendations:  Problem # 1:  CORONARY ARTERY DISEASE (ICD-414.00) He had remote bare-metal stents to the circumflex and RCA. He had nonobstructive disease at catheterization in May of 2010. He's  had no chest pain and has promised stable. His updated medication list for this problem includes:    Bystolic 10 Mg Tabs (Nebivolol hcl) ..... One by mouth daily    Bayer Low Strength 81 Mg Tbec (Aspirin) ..... Once daily  Orders: EKG w/ Interpretation (93000)  Problem # 2:  HYPERLIPIDEMIA (ICD-272.4) His last LDL was 60. He is scheduled for a lipid  profile with his primary care physician soon. His updated medication list for this problem includes:    Zocor 40 Mg Tabs (Simvastatin) .Marland Kitchen... Take one tablet daily at bedtime  Problem # 3:  HYPERTENSION (ICD-401.9) This is controlled on current medications. His updated medication list for this problem includes:    Bystolic 10 Mg Tabs (Nebivolol hcl) ..... One by mouth daily    Bayer Low Strength 81 Mg Tbec (Aspirin) ..... Once daily  Patient Instructions: 1)  Your physician recommends that you continue on your current medications as directed. Please refer to the Current Medication list given to you today. 2)  Your physician wants you to follow-up in:  1 year with Dr. Clifton James. You will receive a reminder letter in the mail two months in advance. If you don't receive a letter, please call our office to schedule the follow-up appointment.

## 2010-03-20 NOTE — Progress Notes (Signed)
Summary: Pt called and said that new pain med is working well  Phone Note Call from Patient Call back at [facility] Phone [phone]   Caller: Patient Reason for Call: Talk to Nurse Summary of Call: Pt called and said that the new med prescribed for back pain, is working well and pt is sleeping. Pt just wanted to let Dr. Lovell Sheehan know.   Initial call taken by: Lucy Antigua,  January 17, 2010 8:52 AM  Follow-up for Phone Call        dr Lovell Sheehan is aware Follow-up by: Willy Eddy, LPN,  January 17, 2010 8:56 AM

## 2010-03-22 NOTE — Progress Notes (Signed)
Summary: refill for [name] pt  Phone Note Refill Request Call back at [facility] Phone [phone] Message from:  Patient on February 13, 2010 1:55 PM  Refills Requested: Medication #1:  MS CONTIN 60 MG XR12H-TAB One by mouth two times a day.  No more than 2. call when ready for p/u  Initial call taken by: Alfred Levins, CMA,  February 13, 2010 1:56 PM  Follow-up for Phone Call        rx ready for p/u, pt aware Follow-up by: Alfred Levins, CMA,  February 13, 2010 3:02 PM    Prescriptions: MS CONTIN 60 MG XR12H-TAB (MORPHINE SULFATE) One by mouth two times a day.  No more than 2.  #60 x 0   Entered and Authorized by:   Edwyna Perfect MD   Signed by:   Edwyna Perfect MD on 02/13/2010   Method used:   Print then Give to Patient   RxID:   [id number]

## 2010-03-22 NOTE — Progress Notes (Addendum)
Summary: new rx  Phone Note Refill Request Call back at Home Phone [phone]   Refills Requested: Medication #1:  MS CONTIN 60 MG XR12H-TAB One by mouth two times a day.  No more than 2. pt has 2 pills left  Initial call taken by: Heron Sabins,  March 13, 2010 10:58 AM    Prescriptions: MS CONTIN 60 MG XR12H-TAB (MORPHINE SULFATE) One by mouth two times a day.  No more than 2.  #60 x 0   Entered by:   Willy Eddy, LPN   Authorized by:   Stacie Glaze MD   Signed by:   Willy Eddy, LPN on 14/78/2956   Method used:   Print then Give to Patient   RxID:   [id number]

## 2010-03-28 ENCOUNTER — Encounter: Payer: Self-pay | Admitting: Internal Medicine

## 2010-03-28 ENCOUNTER — Ambulatory Visit (INDEPENDENT_AMBULATORY_CARE_PROVIDER_SITE_OTHER): Payer: MEDICARE | Admitting: Internal Medicine

## 2010-03-28 VITALS — BP 136/80 | HR 48 | Temp 98.2°F | Resp 14 | Ht 72.0 in | Wt 186.0 lb

## 2010-03-28 DIAGNOSIS — E785 Hyperlipidemia, unspecified: Secondary | ICD-10-CM

## 2010-03-28 DIAGNOSIS — G8929 Other chronic pain: Secondary | ICD-10-CM

## 2010-03-28 DIAGNOSIS — M1710 Unilateral primary osteoarthritis, unspecified knee: Secondary | ICD-10-CM

## 2010-03-28 DIAGNOSIS — IMO0002 Reserved for concepts with insufficient information to code with codable children: Secondary | ICD-10-CM

## 2010-03-28 DIAGNOSIS — F3289 Other specified depressive episodes: Secondary | ICD-10-CM

## 2010-03-28 DIAGNOSIS — Z125 Encounter for screening for malignant neoplasm of prostate: Secondary | ICD-10-CM

## 2010-03-28 DIAGNOSIS — Z0000 Encounter for general adult medical examination without abnormal findings: Secondary | ICD-10-CM

## 2010-03-28 DIAGNOSIS — F329 Major depressive disorder, single episode, unspecified: Secondary | ICD-10-CM

## 2010-03-28 LAB — CBC WITH DIFFERENTIAL/PLATELET
Basophils Relative: 0.5 % (ref 0.0–3.0)
Eosinophils Absolute: 0.4 10*3/uL (ref 0.0–0.7)
Eosinophils Relative: 3.7 % (ref 0.0–5.0)
HCT: 39.7 % (ref 39.0–52.0)
Lymphs Abs: 2.1 10*3/uL (ref 0.7–4.0)
MCHC: 33.8 g/dL (ref 30.0–36.0)
MCV: 88.3 fl (ref 78.0–100.0)
Monocytes Absolute: 0.6 10*3/uL (ref 0.1–1.0)
Neutro Abs: 8 10*3/uL — ABNORMAL HIGH (ref 1.4–7.7)
Neutrophils Relative %: 72.2 % (ref 43.0–77.0)
RBC: 4.5 Mil/uL (ref 4.22–5.81)
WBC: 11.1 10*3/uL — ABNORMAL HIGH (ref 4.5–10.5)

## 2010-03-28 LAB — BASIC METABOLIC PANEL
BUN: 17 mg/dL (ref 6–23)
Chloride: 103 mEq/L (ref 96–112)
Creatinine, Ser: 1.1 mg/dL (ref 0.4–1.5)
GFR: 69.37 mL/min (ref >60.00)
Potassium: 5 mEq/L (ref 3.5–5.1)

## 2010-03-28 LAB — HEPATIC FUNCTION PANEL
ALT: 17 U/L (ref 0–53)
AST: 22 U/L (ref 0–37)
Bilirubin, Direct: 0.1 mg/dL (ref 0.0–0.3)
Total Bilirubin: 0.5 mg/dL (ref 0.3–1.2)

## 2010-03-28 LAB — POCT URINALYSIS DIPSTICK
Blood, UA: NEGATIVE
Glucose, UA: NEGATIVE
Leukocytes, UA: NEGATIVE
Protein, UA: NEGATIVE
Urobilinogen, UA: 0.2

## 2010-03-28 LAB — LDL CHOLESTEROL, DIRECT: Direct LDL: 59.1 mg/dL

## 2010-03-28 LAB — LIPID PANEL
HDL: 25.2 mg/dL — ABNORMAL LOW (ref >39.00)
Total CHOL/HDL Ratio: 5
VLDL: 40.2 mg/dL — ABNORMAL HIGH (ref 0.0–40.0)

## 2010-03-28 NOTE — Progress Notes (Signed)
Subjective:    Patient ID: [name], male    DOB: Jul 17, 1940, 70 y.o.   MRN: [id number]  HPI Review of Systems      Physical Exam     GERHARDT [name] is a 70 y.o. male who presents for a welcome to Medicare exam.   he is also here for review of chronic problems including hyperlipidemia history of coronary artery disease history of joint replacement with a loose joint affecting his mobility. He is also followed for chronic pain management. An acute problem is increased depression with no suicidal ideation by the loss of satisfaction and enjoyment of life and increased somnolence and isolation.  He is compliant with all of his current medications these were reviewed with the patient he denies chest pain abnormal shortness of breath orthopnea or diaphoresis.  Cardiac risk factors: advanced age (older than 65 for men, 74 for women), dyslipidemia, hypertension, male gender, sedentary lifestyle and smoking/ tobacco exposure.  Depression Screen (Note: if answer to either of the following is "Yes", a more complete depression screening is indicated)  Q1: Over the past two weeks, have you felt down, depressed or hopeless? yes Q2: Over the past two weeks, have you felt little interest or pleasure in doing things? yes  Activities of Daily Living In your present state of health, do you have any difficulty performing the following activities?:  Preparing food and eating?: No Bathing yourself: No Getting dressed: No Using the toilet:No Moving around from place to place: No In the past year have you fallen or had a near fall?:No  Current exercise habits: Exercise is limited by orthopedic condition(s): the pts  replacement on the left  the artificial joint has loosened and he require repair to the lower section of his joint replacement.  Dietary issues discussed: review of low-cholesterol diet  Hearing difficulties: Yes has not been to an audiologist Safe in current home environment:  yes  The following portions of the patient's history were reviewed and updated as appropriate: allergies, current medications, past family history, past medical history, past social history, past surgical history and problem list. Review of Systems Constitutional: positive for fatigue and weight loss Eyes: negative Ears, nose, mouth, throat, and face: positive for hearing loss, nasal congestion and tinnitus Respiratory: negative Cardiovascular: negative Gastrointestinal: negative Genitourinary:positive for decreased stream and urinary incontinence Musculoskeletal:positive for bone pain, muscle weakness and  loose prosthesis Neurological: positive for headaches, memory problems and weakness Behavioral/Psych: positive for bad mood, fatigue, irritability and mood swings Allergic/Immunologic: positive for hay fever    Review end-of-life issues including living will status the patient has no current living will in the chart  he is currently seeing an orthopedist as his only active referral   Objective:     Vision by Snellen chart: right eye:20/20, left eye:20/20 Blood pressure 136/80, pulse 48, temperature 98.2 F (36.8 C), temperature source Oral, resp. rate 14, height 6' (1.829 m), weight 186 lb (84.369 kg). Body mass index is 25.23 kg/(m^2). General appearance: appears older than stated age, fatigued and mild distress Head: Normocephalic, without obvious abnormality, atraumatic Eyes: conjunctivae/corneas clear. PERRL, EOM's intact. Fundi benign. Ears: normal TM's and external ear canals both ears Nose: Nares normal. Septum midline. Mucosa normal. No drainage or sinus tenderness., no polyps, no crusting or bleeding points, deviated septum Throat: lips, mucosa, and tongue normal; teeth and gums normal Neck: no adenopathy, no carotid bruit, no JVD, supple, symmetrical, trachea midline and thyroid not enlarged, symmetric, no tenderness/mass/nodules Back:  lordosis  and SI joint tenderness  detected surgical changes noted Lungs: clear to auscultation bilaterally and normal percussion bilaterally Chest wall: no tenderness Heart: regular rate and rhythm and systolic murmur: early systolic 1/6, crescendo at 2nd left intercostal space Abdomen: soft, non-tender; bowel sounds normal; no masses,  no organomegaly Male genitalia: normal, normal findings: normal circumcised penis and no urethral discharge Rectal: normal tone, normal prostate, no masses or tenderness and the prostate is on normal consistency without nodules Extremities: edema  trace edema bilateral Pulses: 2+ and symmetric Skin: eczema - scattered and  actinic keratoses on the face Lymph nodes: Cervical, supraclavicular, and axillary nodes normal. Neurologic: Mental status: alertness: lethargic, affect: blunted Cranial nerves: normal Sensory: reduced tactile sense  stocking foot distribution, proprioceptive sense reduced lower leg(s) bilateral,  stocking foot distribution bilaterally Motor: some motor retardation with decreased grip strength bilaterally Gait: Abnormal    cognitive assessment was performed he is able to balance his checkbook he is alert oriented to person place time and identify the president he was able to recall 3 out of 3 objects and perform simple calculations despite appropriate judgment and could read the correct time problem.    Assessment:        Gen. Medicare preventative services were performed today per the protocol. A review of his immunization history and referrals  Hyperlipidemia.   Chronic back pain.   Coronary artery disease.   worsening depression.      Plan:    1. Hyperlipidemia lipid panel was drawn at this time will be reviewed with the patient he should continue all current medications.  2. The patient has known coronary disease but is without symptoms at this time appropriate monitoring is up to date review of medications with the patient's an urge compliance with all  medications his increased risk factors include a sedentary lifestyle and lack of exercise we have asked the patient to seek help from his orthopedic surgeon so that he can resume activity.  3. Severe degenerative joint disease status post total knee placement on the left with loose prosthesis. Referral to  Orthopedics  4. Chronic  Pain stable management at this time  5. Worsening depression  we will discontinue the sertraline and change this medication to Cymbalta. This medication change may also help his chronic back pain as well as treat his depression. If his blunted affect does not respond to change medication refer to psychiatry for neuropsychiatry testing.  During the course of the visit the patient was educated and counseled about appropriate screening and preventive services including:   Prostate cancer screening  Colorectal cancer screening  Diabetes screening  Nutrition counseling   Advanced directives: has NO advanced directive  - add't info requested. Referral to SW: no  Patient Instructions (the written plan) was given to the patient.

## 2010-03-28 NOTE — Patient Instructions (Signed)
Hypertriglyceridemia   Diet for High blood levels of Triglycerides     Most fats in food are triglycerides. Triglycerides in your blood are stored as fat in your body. High levels of triglycerides in your blood may put you at a greater risk for heart disease and stroke.      Normal triglyceride levels are less than 150 mg/dL. Borderline high levels are 150-199 mg/dl. High levels are 200 - 499 mg/dL, and very high triglyceride levels are greater than 500 mg/dL. The decision to treat high triglycerides is generally based on the level. For people with borderline or high triglyceride levels, treatment includes weight loss and exercise. Drugs are recommended for people with very high triglyceride levels.     Many people who need treatment for high triglyceride levels have metabolic syndrome. This syndrome is a collection of disorders that often include: insulin resistance, high blood pressure, blood clotting problems, high cholesterol and triglycerides.     TESTING PROCEDURE FOR TRIGLYCERIDES   You should not eat 4 hours before getting your triglycerides measured. The normal range of triglycerides is between 10 and 250 milligrams per deciliter (mg/dl). Some people may have extreme levels (1000 or above), but your triglyceride level may be too high if it is above 150 mg/dl, depending on what other risk factors you have for heart disease.    People with high blood triglycerides may also have high blood cholesterol levels. If you have high blood cholesterol as well as high blood triglycerides, your risk for heart disease is probably greater than if you only had high triglycerides. High blood cholesterol is one of the main risk factors for heart disease.      CHANGING YOUR DIET    Your weight can affect your blood triglyceride level. If you are more than 20% above your ideal body weight, you may be able to lower your blood triglycerides by losing weight. Eating less and exercising regularly is the best way to combat this. Fat provides more calories than any other food. The best way to lose weight is to eat less fat. Only 30% of your total calories should come from fat. Less than 7% of your diet should come from saturated fat. A diet low in fat and saturated fat is the same as a diet to decrease blood cholesterol. By eating a diet lower in fat, you may lose weight, lower your blood cholesterol, and lower your blood triglyceride level.      Eating a diet low in fat, especially saturated fat, may also help you lower your blood triglyceride level. Ask your dietitian to help you figure how much fat you can eat based on the number of calories your caregiver has prescribed for you.      Exercise, in addition to helping with weight loss may also help lower triglyceride levels.    Alcohol can increase blood triglycerides. You may need to stop drinking alcoholic beverages.    Too much carbohydrate in your diet may also increase your blood triglycerides. Some complex carbohydrates are necessary in your diet. These may include bread, rice, potatoes, other starchy vegetables and cereals.    Reduce "simple" carbohydrates. These may include pure sugars, candy, honey, and jelly without losing other nutrients. If you have the kind of high blood triglycerides that is affected by the amount of carbohydrates in your diet, you will need to eat less sugar and less high-sugar foods. Your caregiver can help you with this.   Adding 2-4 grams of fish oil (EPA+   DHA) may also help lower triglycerides. Speak with your caregiver before adding any supplements to your regimen.      Following the Diet    Maintain your ideal weight. Your caregivers can help you with a diet. Generally, eating less food and getting more exercise will help you lose weight. Joining a weight control group may also help. Ask your caregivers for a good weight control group in your area.      Eat low-fat foods instead of high-fat foods. This can help you lose weight too.   These foods are lower in fat. Eat MORE of these:    Dried beans, peas, and lentils.    Egg whites.   Low-fat cottage cheese.    Fish.    Lean cuts of meat, such as round, sirloin, rump, and flank (cut extra fat off meat you fix).  Whole grain breads, cereals and pasta.    Skim and nonfat dry milk.    Low-fat yogurt.    Poultry without the skin.    Cheese made with skim or part-skim milk, such as mozzarella, parmesan, farmers', ricotta, or pot cheese.       These are higher fat foods. Eat LESS of these:    Whole milk and foods made from whole milk, such as American, blue, cheddar, monterey jack, and swiss cheese   High-fat meats, such as luncheon meats, sausages, knockwurst, bratwurst, hot dogs, ribs, corned beef, ground pork, and regular ground beef.    Fried foods.     Limit saturated fats in your diet. Substituting unsaturated fat for saturated fat may decrease your blood triglyceride level. You will need to read package labels to know which products contain saturated fats.   These foods are high in saturated fat. Eat LESS of these:    Fried pork skins.    Whole milk.   Skin and fat from poultry.   Palm oil.   Butter.   Shortening.   Cream cheese.   Bacon.   Margarines and baked goods made from listed oils.   Vegetable shortenings.  Chitterlings.    Fat from meats.   Coconut oil.   Palm kernel oil.   Lard.   Cream.   Sour cream.   Fatback.   Coffee whiteners and non-dairy creamers made with these oils.   Cheese made from whole milk.       Use unsaturated fats (both polyunsaturated and monounsaturated) moderately. Remember, even though unsaturated fats are better than saturated fats; you still want a diet low in total fat.   These foods are high in unsaturated fat:    Canola oil.    Sunflower oil.   Mayonnaise.   Almonds.   Peanuts.   Pine nuts.   Margarines made with these oils.  Safflower oil.    Olive oil.   Avocados.   Cashews.   Peanut butter.   Sunflower seeds.     Soybean oil.    Peanut oil.   Olives.   Pecans.   Walnuts.   Pumpkin seeds.      Avoid sugar and other high-sugar foods. This will decrease carbohydrates without decreasing other nutrients. Sugar in your food goes rapidly to your blood. When there is excess sugar in your blood, your liver may use it to make more triglycerides. Sugar also contains calories without other important nutrients.      Eat LESS of these:    Sugar, brown sugar, powdered sugar, jam, jelly, preserves, honey, syrup, molasses, pies, candy, cakes, cookies,   frosting, pastries, colas, soft drinks, punches, fruit drinks, and regular gelatin.    Avoid alcohol. Alcohol, even more than sugar, may increase blood triglycerides. In addition, alcohol is high in calories and low in nutrients. Ask for sparkling water, or a diet soft drink instead of an alcoholic beverage.      Suggestions for planning and preparing meals    Bake, broil, grill or roast meats instead of frying.   Remove fat from meats and skin from poultry before cooking.    Add spices, herbs, lemon juice or vinegar to vegetables instead of salt, rich sauces or gravies.    Use a non-stick skillet without fat or use no-stick sprays.    Cool and refrigerate stews and broth. Then remove the hardened fat floating on the surface before serving.    Refrigerate meat drippings and skim off fat to make low-fat gravies.    Serve more fish.    Use less butter, margarine and other high-fat spreads on bread or vegetables.     Use skim or reconstituted non-fat dry milk for cooking.    Cook with low-fat cheeses.    Substitute low-fat yogurt or cottage cheese for all or part of the sour cream in recipes for sauces, dips or congealed salads.    Use half yogurt/half mayonnaise in salad recipes.    Substitute evaporated skim milk for cream. Evaporated skim milk or reconstituted non-fat dry milk can be whipped and substituted for whipped cream in certain recipes.    Choose fresh fruits for dessert instead of high-fat foods such as pies or cakes. Fruits are naturally low in fat.    When Dining Out    Order low-fat appetizers such as fruit or vegetable juice, pasta with vegetables or tomato sauce.    Select clear, rather than cream soups.    Ask that dressings and gravies be served on the side. Then use less of them.    Order foods that are baked, broiled, poached, steamed, stir-fried, or roasted.    Ask for margarine instead of butter, and use only a small amount.    Drink sparkling water, unsweetened tea or coffee, or diet soft drinks instead of alcohol or other sweet beverages.      QUESTIONS AND ANSWERS ABOUT OTHER FATS IN THE BLOOD:   SATURATED FAT, TRANS FAT, AND CHOLESTEROL     What is trans fat?  Trans fat is a type of fat that is formed when vegetable oil is hardened through a process called hydrogenation. This process helps makes foods more solid, gives them shape, and prolongs their shelf life. Trans fats are also called hydrogenated or partially hydrogenated oils.      What do saturated fat, trans fat, and cholesterol in foods have to do with heart disease?  Saturated fat, trans fat, and cholesterol in the diet all raise the level of LDL "bad" cholesterol in the blood. The higher the LDL cholesterol, the greater the risk for coronary heart disease (CHD). Saturated fat and trans fat raise LDL similarly.      What foods contain saturated fat, trans fat, and cholesterol?   High amounts of saturated fat are found in animal products, such as fatty cuts of meat, chicken skin, and full-fat dairy products like butter, whole milk, cream, and cheese, and in tropical vegetable oils such as palm, palm kernel, and coconut oil. Trans fat is found in some of the same foods as saturated fat, such as vegetable shortening, some margarines (especially hard or stick   margarine), crackers, cookies, baked goods, fried foods, salad dressings, and other processed foods made with partially hydrogenated vegetable oils. Small amounts of trans fat also occur naturally in some animal products, such as milk products, beef, and lamb. Foods high in cholesterol include liver, other organ meats, egg yolks, shrimp, and full-fat dairy products.     How can I use the new food label to make heart-healthy food choices?  Check the Nutrition Facts panel of the food label. Choose foods lower in saturated fat, trans fat, and cholesterol. For saturated fat and cholesterol, you can also use the Percent Daily Value (%DV): 5% DV or less is low, and 20% DV or more is high. (There is no %DV for trans fat.) Use the Nutrition Facts panel to choose foods low in saturated fat and cholesterol, and if the trans fat is not listed, read the ingredients and limit products that list shortening or hydrogenated or partially hydrogenated vegetable oil, which tend to be high in trans fat.     POINTS TO REMEMBER: YOU NEED A LITTLE TLC (THERAPEUTIC LIFESTYLE CHANGES)   Discuss your risk for heart disease with your caregivers, and take steps to reduce risk factors.   Change your diet. Choose foods that are low in saturated fat, trans fat, and cholesterol.   Add exercise to your daily routine if it is not already being done. Participate in physical activity of moderate intensity, like brisk walking, for at least 30 minutes on most, and preferably all days of the week. No time? Break the 30 minutes into three, 10-minute segments during the day.    Stop smoking. If you do smoke, contact your caregiver to discuss ways in which they can help you quit.   Do not use street drugs.   Maintain a normal weight.   Maintain a healthy blood pressure.   Keep up with your blood work for checking the fats in your blood as directed by your caregiver.     Most of this information is courtesy of the National [employer], [employer], [employer].     Document Released: 11/23/2003  Document Re-Released: 06/23/2008  [employer] Patient Information 2011 [employer].

## 2010-04-12 ENCOUNTER — Telehealth: Payer: Self-pay | Admitting: Internal Medicine

## 2010-04-12 DIAGNOSIS — M545 Low back pain: Secondary | ICD-10-CM

## 2010-04-12 NOTE — Telephone Encounter (Signed)
Triage vm----refill Morphine. He stated that he will be out of medication on Saturday.

## 2010-04-13 MED ORDER — MORPHINE SULFATE CR 60 MG PO TB12: 60.0000 mg | ORAL_TABLET | Freq: Two times a day (BID) | ORAL | Status: DC

## 2010-05-02 LAB — CBC
MCHC: 32.6 g/dL (ref 30.0–36.0)
MCV: 92.3 fL (ref 78.0–100.0)
Platelets: 268 10*3/uL (ref 150–400)
RDW: 14.4 % (ref 11.5–15.5)
WBC: 10.8 10*3/uL — ABNORMAL HIGH (ref 4.0–10.5)

## 2010-05-02 LAB — GLUCOSE, CAPILLARY: Glucose-Capillary: 136 mg/dL — ABNORMAL HIGH (ref 70–99)

## 2010-05-02 LAB — POCT I-STAT, CHEM 8
BUN: 22 mg/dL (ref 6–23)
Calcium, Ion: 1.05 mmol/L — ABNORMAL LOW (ref 1.12–1.32)
HCT: 36 % — ABNORMAL LOW (ref 39.0–52.0)
Hemoglobin: 12.2 g/dL — ABNORMAL LOW (ref 13.0–17.0)
Sodium: 136 mEq/L (ref 135–145)
TCO2: 24 mmol/L (ref 0–100)

## 2010-05-02 LAB — DIFFERENTIAL
Basophils Absolute: 0 10*3/uL (ref 0.0–0.1)
Basophils Relative: 0 % (ref 0–1)
Eosinophils Relative: 3 % (ref 0–5)
Lymphocytes Relative: 32 % (ref 12–46)

## 2010-05-03 LAB — CBC
HCT: 23.9 % — ABNORMAL LOW (ref 39.0–52.0)
Hemoglobin: 14.1 g/dL (ref 13.0–17.0)
Hemoglobin: 8.1 g/dL — ABNORMAL LOW (ref 13.0–17.0)
Hemoglobin: 8.6 g/dL — ABNORMAL LOW (ref 13.0–17.0)
MCH: 32.7 pg (ref 26.0–34.0)
MCH: 32.8 pg (ref 26.0–34.0)
MCHC: 34.2 g/dL (ref 30.0–36.0)
MCHC: 34.3 g/dL (ref 30.0–36.0)
MCHC: 34.8 g/dL (ref 30.0–36.0)
MCHC: 34.9 g/dL (ref 30.0–36.0)
Platelets: 198 10*3/uL (ref 150–400)
Platelets: 215 10*3/uL (ref 150–400)
Platelets: 272 10*3/uL (ref 150–400)
RBC: 4.25 MIL/uL (ref 4.22–5.81)
RDW: 13.5 % (ref 11.5–15.5)

## 2010-05-03 LAB — CROSSMATCH: Antibody Screen: NEGATIVE

## 2010-05-03 LAB — URINALYSIS, ROUTINE W REFLEX MICROSCOPIC
Bilirubin Urine: NEGATIVE
Ketones, ur: NEGATIVE mg/dL
Nitrite: NEGATIVE
Protein, ur: NEGATIVE mg/dL
Specific Gravity, Urine: 1.01 (ref 1.005–1.030)
Urobilinogen, UA: >8 mg/dL — ABNORMAL HIGH (ref 0.0–1.0)

## 2010-05-03 LAB — HEMOGLOBIN AND HEMATOCRIT, BLOOD
HCT: 25.1 % — ABNORMAL LOW (ref 39.0–52.0)
Hemoglobin: 8.6 g/dL — ABNORMAL LOW (ref 13.0–17.0)

## 2010-05-03 LAB — BASIC METABOLIC PANEL
Calcium: 9.2 mg/dL (ref 8.4–10.5)
Creatinine, Ser: 1.25 mg/dL (ref 0.4–1.5)
GFR calc Af Amer: >60 mL/min (ref >60)
GFR calc non Af Amer: 57 mL/min — ABNORMAL LOW (ref >60)
Sodium: 136 mEq/L (ref 135–145)

## 2010-05-03 LAB — PROTIME-INR
INR: 1.18 (ref 0.00–1.49)
INR: 2.9 — ABNORMAL HIGH (ref 0.00–1.49)
INR: 3.01 — ABNORMAL HIGH (ref 0.00–1.49)
INR: 3.61 — ABNORMAL HIGH (ref 0.00–1.49)
Prothrombin Time: 15.2 seconds (ref 11.6–15.2)
Prothrombin Time: 18.3 seconds — ABNORMAL HIGH (ref 11.6–15.2)
Prothrombin Time: 30.1 seconds — ABNORMAL HIGH (ref 11.6–15.2)
Prothrombin Time: 30.4 seconds — ABNORMAL HIGH (ref 11.6–15.2)

## 2010-05-03 LAB — SURGICAL PCR SCREEN
MRSA, PCR: NEGATIVE
Staphylococcus aureus: POSITIVE — AB

## 2010-05-06 LAB — POCT I-STAT, CHEM 8
Calcium, Ion: 1.21 mmol/L (ref 1.12–1.32)
Chloride: 108 mEq/L (ref 96–112)
Glucose, Bld: 79 mg/dL (ref 70–99)
Glucose, Bld: 97 mg/dL (ref 70–99)
HCT: 43 % (ref 39.0–52.0)
HCT: 44 % (ref 39.0–52.0)
Hemoglobin: 14.6 g/dL (ref 13.0–17.0)
Hemoglobin: 15 g/dL (ref 13.0–17.0)
Potassium: 4.2 mEq/L (ref 3.5–5.1)
Sodium: 141 mEq/L (ref 135–145)
TCO2: 27 mmol/L (ref 0–100)

## 2010-05-16 ENCOUNTER — Telehealth: Payer: Self-pay | Admitting: Internal Medicine

## 2010-05-16 DIAGNOSIS — M545 Low back pain: Secondary | ICD-10-CM

## 2010-05-16 MED ORDER — MORPHINE SULFATE CR 60 MG PO TB12: 60.0000 mg | ORAL_TABLET | Freq: Two times a day (BID) | ORAL | Status: DC

## 2010-05-16 NOTE — Telephone Encounter (Signed)
Pt needs new rx ms contin 60mg . Please call when ready for pick up

## 2010-05-16 NOTE — Telephone Encounter (Signed)
Will be ready after 12noon

## 2010-05-21 ENCOUNTER — Telehealth: Payer: Self-pay | Admitting: *Deleted

## 2010-05-21 MED ORDER — BISOPROLOL FUMARATE 5 MG PO TABS: 5.0000 mg | ORAL_TABLET | Freq: Every day | ORAL | Status: DC

## 2010-05-21 NOTE — Telephone Encounter (Signed)
Bystolic too expensive.  Would like to have it changed.

## 2010-05-21 NOTE — Telephone Encounter (Signed)
Change to bisoprolol 5 every day-per dr Lovell Sheehan- pt informed and med sent to [facility]

## 2010-06-01 ENCOUNTER — Other Ambulatory Visit: Payer: Self-pay | Admitting: *Deleted

## 2010-06-01 MED ORDER — SIMVASTATIN 40 MG PO TABS: 40.0000 mg | ORAL_TABLET | Freq: Every day | ORAL | Status: DC

## 2010-06-14 ENCOUNTER — Other Ambulatory Visit: Payer: Self-pay | Admitting: *Deleted

## 2010-06-14 MED ORDER — SERTRALINE HCL 100 MG PO TABS: 100.0000 mg | ORAL_TABLET | Freq: Every day | ORAL | Status: DC

## 2010-06-14 MED ORDER — OMEPRAZOLE-SODIUM BICARBONATE 40-1100 MG PO CAPS: 1.0000 | ORAL_CAPSULE | Freq: Every day | ORAL | Status: DC

## 2010-06-22 ENCOUNTER — Other Ambulatory Visit: Payer: Self-pay | Admitting: Internal Medicine

## 2010-06-22 DIAGNOSIS — M545 Low back pain, unspecified: Secondary | ICD-10-CM

## 2010-06-22 MED ORDER — MORPHINE SULFATE CR 60 MG PO TB12: 60.0000 mg | ORAL_TABLET | Freq: Two times a day (BID) | ORAL | Status: DC

## 2010-06-22 NOTE — Telephone Encounter (Signed)
Pt needs new rx methadone 60mg 

## 2010-06-22 NOTE — Telephone Encounter (Signed)
done

## 2010-07-03 NOTE — Op Note (Signed)
NAMEJAS, [name]                ACCOUNT NO.:  [id number]   MEDICAL RECORD NO.:  [id number]          PATIENT TYPE:  AMB   LOCATION:  [facility]                         FACILITY:  [facility]   PHYSICIAN:  Marlowe Kays, M.D.  DATE OF BIRTH:  1941-01-23   DATE OF PROCEDURE:  12/14/2007  DATE OF DISCHARGE:                               OPERATIVE REPORT   PREOPERATIVE DIAGNOSES:  1. Extensive full-thickness rotator cuff tear.  2. Osteoarthritis acromioclavicular joint.  3. Degenerated/torn labrum and partial-tear of subscapularis tendon      right shoulder.   POSTOPERATIVE DIAGNOSES:  1. Extensive full-thickness rotator cuff tear.  2. Osteoarthritis acromioclavicular joint.  3. Degenerated/torn labrum and partial-tear of subscapularis tendon      right shoulder.   OPERATION:  1. Right shoulder arthroscopy with debridement of labrum, biceps      tendon and partial subscapularis tear.  2. Open distal clavicle resection.  3. Open anterior acromionectomy with repair of torn rotator cuff.   SURGEON:  Dr. Simonne Come.   ASSISTANT:  Idolina Primer, PA-C.   ANESTHESIA:  General.   PATHOLOGY/INDICATION FOR PROCEDURE:  Because of significant pain in the  right shoulder he was sent for an MRI of the right shoulder which  demonstrated the preoperative diagnoses and accordingly he is here today  for the above-mentioned surgery.   PROCEDURE IN DETAIL:  After prophylactic antibiotics, satisfactory  interscalene block by anesthesia and satisfactory general anesthesia,  the patient was positioned in the beach-chair position on the sliding  frame.  The right shoulder girdle was prepped with DuraPrep and draped  in a sterile field.  Time-out was performed.  The anatomy of the  shoulder joint was marked out and a site for the posterior portal and  the distal clavicle and open anterior acromionectomy sites were  infiltrated with 0.5% Marcaine with adrenaline, mainly for hemostasis.  Through a posterior  soft-spot portal I atraumatically entered the  glenohumeral joint.  On inspection he good bit of synovitis,  degeneration of the portion of the labrum adjacent to the biceps anchor,  some partial degenerative wear of the biceps tendon which was basically  intact and some significant fraying and disruption of the undersurface  of the subscapularis.  I then advanced the scope between the biceps  tendon and subscapularis using a switching stick and made an anterior  incision in which I placed a metal cannula, then introduced a 4.2-shaver  into the joint, debriding down the labrum until smooth, the biceps  tendon until smooth and the undersurface of the subscapularis until  smooth.  Pictures pre and post debridement were taken.  I then removed  all fluid possible from the joint and made an incision over the distal  clavicle extending downward, vertically over the rotator cuff area.  The  [facility] joint was identified and distal clavicle exposed.  I marked out 1.5  cm from the [facility] joint.  We undermined the clavicle at this point and  using some baby Homans I used a micro-saw to amputate the bone which I  removed with towel clip and cautery technique.  Several small  remnants  of bone were removed from the parent bone which I then covered with bone  wax.  I then extended the incision distally using cutting cautery with  subperiosteal dissection exposing the anterior acromion.  He had a very  tight impingement problem.  It should also be noted that a large saw-  tooth-type spur off the distal clavicle on inspection of the remnant was  removed.  I undermined the anterior acromion with a small Cobb elevator  followed by a larger one and we then used a micro-saw to make my initial  anterior acromionectomy.  He had a significant impingement problem and I  removed additional bone protecting the underneath of the rotator cuff  until his impingement problem had been released.  I then inspected the  rotator cuff  tear.  There was a partial tear of the subscapularis tendon  and a complete retracted tear of the supraspinatus and infraspinatus.  Working first anteriorly I used a Forensic scientist and  used two of the threads to anchor down the anterior portion of the tear,  supplemented by using the strands to make individual sutures in remnant  of rotator cuff which was left attached to the bone.  I then used the  second strand to secure additional rotator cuff, again supplemented with  individual side-to-side strands.  There was a little bit of a centimeter  or so which we did not feel required rotator cuff anchor since there was  some good healthy rotator cuff tissue attached to the humerus.  I closed  this gap down with individual suture of the strand fragments.  This  seemed to give a nice secure tear with his arm down to his side.  There  was no residual impingement.  I irrigated the wound well.  I had  previously taken pictures of the torn rotator cuff, leaving the camera  mechanism intact, and then took final pictures of the repaired rotator  cuff.  Gelfoam was placed in the distal clavicle resection site and the  wound was then closed with interrupted #1 Vicryl in the small slit in  the deltoid and in the fascia and periosteum over the anterior acromion  and distal clavicle.  The subcutaneous tissue was closed with 2-0 Vicryl  and Steri-Strips on the skin.  For the major incision, following the  arthroscopy, I closed the two portals with 4-0 nylon and then we applied  an Ioban, and Betadine and Adaptic were applied to these two portals  with Steri-Strips on the major incision, followed dry dressing and  shoulder immobilizer.  He tolerated the procedure well and was taken to  recovery in satisfactory condition with no known complications.           ______________________________  Marlowe Kays, M.D.     JA/MEDQ  D:  12/14/2007  T:  12/14/2007  Job:  [id number]

## 2010-07-03 NOTE — Cardiovascular Report (Signed)
NAME:  [name], [name]                ACCOUNT NO.:  [id number]   MEDICAL RECORD NO.:  [id number]          PATIENT TYPE:  OIB   LOCATION:  1961                         FACILITY:  [facility]   PHYSICIAN:  Everardo Beals. Juanda Chance, MD, FACCDATE OF BIRTH:  August 05, 1940   DATE OF PROCEDURE:  07/08/2008  DATE OF DISCHARGE:  07/08/2008                            CARDIAC CATHETERIZATION   CLINICAL HISTORY:  [name] is 70 years old.  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  He has had previous stenting of the right coronary  artery and circumflex artery with bare-metal stents and last had  nonobstructive coronary disease in 2005.  He recently has had increasing  problems with lumbar spine disease and surgery is a possibility.  We  arranged for him to have a Myoview scan, partly as a preoperative  evaluation and partly because his ECG showed a change from previously  with a possible new DMI.  The scan showed a small area of apical  ischemia.   PROCEDURE:  The procedure was performed via the right femoral artery as  an arterial sheath and 4-French preformed coronary catheters.  There was  marked tortuosity in the iliac, so we had to use the wire to help  navigate our catheter into the coronary arteries.  The patient tolerated  the procedure well and left the laboratory in satisfactory condition.   RESULTS:  1. The aortic pressure was 140/81 with a mean of 108.  Left      ventricular pressure was 140/17.  2. Left main coronary artery.  Left main coronary artery was free of      significant disease.  3. Left anterior descending artery.  Left anterior descending artery      gave rise to 2 diagonal branches and 2 septal perforators.  The LAD      was diffusely irregular with moderate calcification.  There was a      60-70% narrowing in the proximal LAD between the first 2 septal      perforators.  First diagonal branch had an 80% mid  stenosis.  This      was not a very large vessel.  4. Circumflex artery.  Circumflex artery gave rise to a marginal      branch and a posterolateral branch.  There was less than 10%      narrowing at the stent site in the proximal portion of the marginal      branch.  There are irregularities but no other major obstruction of      the circumflex artery.  5. The right coronary artery.  The right coronary artery is a moderate-      sized vessel and gave rise to a conus branch, right ventricular      branch, a posterior descending branch, and 2 posterolateral      branches.  There was 40% narrowing within the proximal portion of      what appeared to be overlapping stents in the mid right coronary  artery.  The vessel is free of major obstruction.  6. The left ventriculogram.  The left ventriculogram performed in the      RAO projection showed a good wall motion with no areas of      hypokinesis.  The estimated ejection fraction was 60%.   CONCLUSION:  Coronary artery status post prior percutaneous coronary  interventions as described above with 60-70% narrowing in the proximal  left anterior descending, 80% narrowing in the first diagonal branch of  the left anterior descending, less than 10% narrowing at the stent site  in the circumflex marginal vessel, 40% narrowing within the stent in the  mid right coronary artery, and normal left ventricular function.   RECOMMENDATIONS:  I do not think the lesion in the LAD is a flow-  limiting lesion, and I suspect the scan represents a false positive  scan.  The patient is not having any symptoms.  We will plan continued  medical therapy.      Bruce Elvera Lennox Juanda Chance, MD, [facility]  Electronically Signed     BRB/MEDQ  D:  07/08/2008  T:  07/08/2008  Job:  [id number]   cc:   Stacie Glaze, MD  Marlowe Kays, M.D.

## 2010-07-06 NOTE — Assessment & Plan Note (Signed)
[facility]                              CARDIOLOGY OFFICE NOTE   [name], [name]                       MRN:          [id number]  DATE:12/03/2005                            DOB:          1940/10/30    PRIMARY PHYSICIAN:  Darryll Capers, M.D.   CLINICAL HISTORY:  [name] is 70 years old.  In 1997, he had a non-ST  elevation infarction treated with angioplasty of the circumflex artery and  subsequently had PCA of the right artery and then stenting of the right  artery.  His last catheterization in 2005 showed non-obstructive disease and  an ejection fraction of 60%.  He has done very well since that time.  He  says he has had no chest pain, shortness of breath, or palpitations.   CURRENT MEDICATIONS:  Include Toprol, diazepam, aspirin, and Flomax.   PAST MEDICAL HISTORY:  Significant for hyperlipidemia with a low HDL and  obstructive sleep apnea for which he is unable to take CPAP, and  hypertension.   On examination, blood pressure is 150/82 and the pulse 77 and regular.  There was no venous distention. The carotid pulses were full without bruits.  CHEST:  Clear without rales or rhonchi.  CARDIAC:  Rhythm was regular. The heart sounds were normal and there were no  murmurs or gallops.  ABDOMEN:  Soft without organomegaly.  The peripheral pulses were full and there was no peripheral edema.   Electrocardiogram was normal.   IMPRESSION:  1. Coronary artery disease status post prior percutaneous coronary      intervention as described above with nonobstructive disease at last      catheterization in 2005, now stable.  2. Good left ventricular function.  3. Hyperlipidemia with low HDL.  4. Obstructive sleep apnea but intolerant of continuous positive airway      pressure.  5. Hypertension.   RECOMMENDATIONS:  [name] is quite stable from a cardiac standpoint.  He  is not on a statin.  His understanding was that his cholesterol was low  and  he did not need a statin but I explained that statins have benefits beyond  their cholesterol-lowering effect.  His last lipid profile that I have a  number of years ago showed a low HDL and a low LDL.  We will plan to start  him on generic Simvastatin especially since cost is an issue.  He is  scheduled to have a physical with Dr. Lovell Sheehan in the next couple of months.  So, his laboratory work prior to that visit will be a followup for his lipid  panel.  His blood pressure is borderline today but he says it has been 120  at home, so we will not change any blood pressure therapy.  Dr. Lovell Sheehan can  recheck that on his  neck visit.  Most of the issues now are secondary prevention and I will turn  his care back over to Dr. Lovell Sheehan and see him back on a p.r.n. basis.            ______________________________  Everardo Beals  Seth Chance, MD, [facility]     BRB/MedQ  DD:  12/03/2005  DT:  12/04/2005  Job #:  [id number]

## 2010-07-06 NOTE — Discharge Summary (Signed)
[place]  Patient:    JENESIS, SUCHY                       MRN: [id number] Adm. Date:  [id number] Disc. Date: [id number] Attending:  Marlowe Kays Page Dictator:   Marveen Reeks. Dasnoit, P.A.                           Discharge Summary  ADMISSION DIAGNOSES: 1. Loose tibial component. Status post left total knee prosthesis. 2. History of coronary artery disease.    a. Status post PTCA with stent February 1999.    b. PTCA November 1997. 3. Gastroesophageal reflux disease with hiatal hernia. 4. Prostate disease. 5. Osteoarthritis.  DISCHARGE DIAGNOSES: 1. Loose tibial component. Status post left total knee prosthesis. 2. History of coronary artery disease.    a. Status post PTCA with stent February 1999.    b. PTCA November 1997. 3. Gastroesophageal reflux disease with hiatal hernia. 4. Prostate disease. 5. Osteoarthritis.  OPERATION PERFORMED:  Revision tibial component left total knee prosthesis by Illene Labrador. Aplington, M.D. April 03, 1999.  CONSULTATIONS:  None.  HISTORY OF PRESENT ILLNESS:  The patient is a 70 year old white male who is status post left total knee prosthesis from 1994. He has done very well with respect to that until December of 2000 when he noted chronic and progressive left knee pain. He was having some pain in the posterior aspect of his calf. He saw Stacie Glaze, M.D. of the [facility] group who obtained venous Dopplers. This was negative for DVT. He did have a fluid collection below the knee medially and slightly anteriorly. X-rays were taken, and this revealed some loosening of the tibial component which has shifted into a varus deformity. He was seen and evaluated by Illene Labrador. Aplington, M.D. and felt that he would need to undergo revision of the knee prosthesis. The procedure, risks, benefits, complications, and postoperative course were discussed with the patient at length; and he was agreement with this  plan.  LABORATORY DATA:  Preoperative labs revealed hemoglobin of 12.1, with hematocrit of 33.5, white blood cell count of 7.2, and 252,000 platelets. Coag studies within normal limits. Chemistry profile entirely normal. Urinalysis was negative.  HOSPITAL COURSE:  The patient was admitted, underwent the above listed surgery. He was placed on Ancef for prophylaxis in addition to Coumadin for DVT prophylaxis. Physical therapy was started with gait training at partial weightbearing, no motion to the left knee, however. A postop hemoglobin was 11.1. The patient was feeling well overall. Neurovascularmotor functions were intact. Second day postop, on February 15, hemoglobin was stabilized at 11.1. He was feeling well overall and had moderate pain. No complaints of shortness of breath, chest pain, or calf pain. He had a good appetite. He was voiding without difficulty. Intraoperative cultures were all negative. On the second day, his IV and PCA were discontinued. Following day, on February 16, he was stable and ready for discharge home.  DISCHARGE MEDICATIONS: 1. Robaxin 500 mg one p.o. q.8h. p.r.n. 2. Percocet 5 mg one p.o. q.4-6h. p.r.n. pain. 3. Coumadin 2.5 mg p.o. q.d. 6 p.m. as directed by pharmacy. He will remain on    his Coumadin for approximately five weeks postop. Coumadin adjusted to    maintain an INR between 2 and 3.  ACTIVITIES:  He will continue to remain partial weightbearing. He will ambulate with a walker. Wear his immobilizer  fulltime with no bending of the knee.  WOUND CARE:  He will keep his dressing clean and dry and change daily.  INSTRUCTIONS:  He will be seen by home health physical therapist three times a week. He will also be seen by home health R.N. to draw protimes with the first one being on February 20.  FOLLOW-UP:  He will return to [employer]. Aplington, M.D. in our office in approximately 10 days time or sooner should he have any problems.  CONDITION  ON DISCHARGE:  Stable and improving.  FINAL DISCHARGE DIAGNOSIS:  Aseptic loosening of the left total knee tibial component requiring revision. DD:  04/27/99 TD:  04/28/99 Job: [id number]

## 2010-07-06 NOTE — Op Note (Signed)
NAME:  [name], [name]                          ACCOUNT NO.:  [id number]   MEDICAL RECORD NO.:  [id number]                   PATIENT TYPE:  AMB   LOCATION:  [facility]                                 FACILITY:  [facility]   PHYSICIAN:  Marlowe Kays, M.D.               DATE OF BIRTH:  27-Dec-1940   DATE OF PROCEDURE:  08/25/2003  DATE OF DISCHARGE:                                 OPERATIVE REPORT   PREOPERATIVE DIAGNOSIS:  Bilateral carpal tunnel syndrome, right worse than  left.   POSTOPERATIVE DIAGNOSIS:  Bilateral carpal tunnel syndrome, right worse than  left.   OPERATION:  Decompression of the median nerve, right wrist and hand.   SURGEON:  Illene Labrador. Aplington, M.D.   ASSISTANT:  Nurse.   ANESTHESIA:  IV regional.   PATHOLOGY AND JUSTIFICATION FOR PROCEDURE:  Signs signs and symptoms of  carpal tunnel syndrome documented by EMG and nerve conduction studies  showing both and motor and sensory nerve involvement.   DESCRIPTION OF PROCEDURE:  Satisfactory IV regional anesthesia.  I did have  to supplement this with some local 0.5% plain Marcaine.  The right hand and  forearm were prepped with DuraPrep, draped in a sterile field.  I marked out  a curved incision along the base of the thenar eminence, crossing obliquely  over the flexor crease of the wrist and the distal forearm.  The median  nerve was identified.  Also noted was a branch of the nerve going to the  ulnar side proximal to the flexor crease at the wrist.  This was protected.  The other unusual finding was fairly significant edema in the wrist and  palm.  I released the skin, subcutaneous tissue, and thick fascia into the  distal palm.  Potential bleeders were coagulated with bipolar cautery.  When  the decompression was completed, I irrigated the wound well with sterile  saline and closed the skin and subcutaneous tissue only with interrupted 4-0  nylon mattress sutures.  Betadine, Adaptic dry sterile dressing, volar  plaster splint were applied.  Tourniquet was released.  He tolerated the  procedure well and at the time of this dictation, was on his way to the  recovery room in satisfactory condition with no known complications.                                               Marlowe Kays, M.D.    JA/MEDQ  D:  08/25/2003  T:  08/25/2003  Job:  [id number]

## 2010-07-06 NOTE — Op Note (Signed)
NAME:  [name], [name]                ACCOUNT NO.:  [id number]   MEDICAL RECORD NO.:  [id number]          PATIENT TYPE:  AMB   LOCATION:  DAY                          FACILITY:  [facility]   PHYSICIAN:  Marlowe Kays, M.D.  DATE OF BIRTH:  03-15-1940   DATE OF PROCEDURE:  12/21/2003  DATE OF DISCHARGE:                                 OPERATIVE REPORT   PREOPERATIVE DIAGNOSES:  Left carpal tunnel syndrome, status post right  carpal tunnel release.   POSTOPERATIVE DIAGNOSES:  Left carpal tunnel syndrome, status post right  carpal tunnel release.   OPERATION:  Decompression of the median nerve, left wrist and hand.   SURGEON:  Illene Labrador. Aplington, M.D.   ASSISTANT:  Nurse.   ANESTHESIA:  General.   PATHOLOGY AND JUSTIFICATION FOR PROCEDURE:  He has had complete relief of  numbness in his right hand and progressive pain and numbness in his left  hand with nerve conduction studies demonstrating carpal tunnel syndrome.  IV  regional was attempted but because of insufficient veins, it was converted  to a general today.   DESCRIPTION OF PROCEDURE:  Satisfactory general anesthesia, pneumonia  tourniquet, arm was esmarched out nonsterilely and prepped from forearm to  fingertips and draped in a sterile field with DuraPrep.  Marked out my  carpal tunnel incision with a curved incision along the base of the thenar  eminence, crossing obliquely over the flexor crease of the wrist and the  distal forearm.  The median nerve was identified at the wrist.  It was  bulbous in nature due to severe compression at the flexor crease.  I used  bipolar cautery to cauterize potential bleeders, protecting the nerve.  I  released skin and subcutaneous tissue and a thick fascia and some muscle  tissue into the mid to distal palm where the compression ended.  I did  continue the decompression into the distal palm, however.  When the  decompression was completed, the wound was irrigated with sterile saline,  and  I closed the skin and subcutaneous tissue only with interrupted 4-0  nylon mattress sutures.  Betadine, Adaptic dry sterile dressing were applied  followed by a volar plaster splint.  Tourniquet was released.  He tolerated  the procedure well and was taken to the recovery room in satisfactory  condition with no known complications.      JA/MEDQ  D:  12/21/2003  T:  12/21/2003  Job:  [id number]

## 2010-07-06 NOTE — H&P (Signed)
NAME:  ELCHONON, MAXSON                          ACCOUNT NO.:  [id number]   MEDICAL RECORD NO.:  [id number]                   PATIENT TYPE:  EMS   LOCATION:  MAJO                                 FACILITY:  [facility]   PHYSICIAN:  [name], M.D.                DATE OF BIRTH:  07-Jul-1940   DATE OF ADMISSION:  09/02/2002  DATE OF DISCHARGE:                                HISTORY & PHYSICAL   HISTORY OF PRESENT ILLNESS:  I was asked by Dr. Earlene Plater in the emergency room  to evaluate Ervin Knack, a patient known to [place] coronary disease with  previous coronary intervention for chest discomfort reminiscent of his  angina since 3 o'clock this afternoon. He came to the emergency room this  evening with no acute EKG changes.  Laboratory data is pending.   His cardiac history is notable for previous posterior myocardial infarction  and PCI of the circumflex in September of 1997.  He subsequently has had PCI  of the posterior descending coronary artery of the right coronary artery in  December 1997 and more recently in the mid right coronary artery in 1998 as  well as most recently in February of 1999.  PTCA and stent of the mid RCA.   He has an ejection fraction of around 56%.   He had done well until this afternoon, while he was working manually.  He  had chest tightness and pressure that stayed in his chest.  There were no  other associated symptoms.  He took antacids but did not have any  nitroglycerin.  He subsequently came to the emergency room as mentioned  above.   ALLERGIES:  No known drug allergies.   CURRENT MEDICATIONS:  1. Aspirin 325 a day.  2. Zocor 20 mg a day.  3. Toprol XL 100 mg a day.  4. He also takes Oxycodone for his back and Valium.   PAST SURGICAL HISTORY:  He has had a total knee replacement.  He has had a  lumbar spinal fusion in 1971.  He had a decompressive laminectomy of L3, L4  and L5 in 2003.   REVIEW OF SYSTEMS:  This is otherwise unremarkable.  He has  not had bleeding  problems.   SOCIAL HISTORY:  He occasionally smokes a cigar.  He worked currently at  [employer].   FAMILY HISTORY:  This is significant for MI in his father who died at age 14  of an MI.  His mother died at age 51 after having coronary artery bypass  grafting.   PHYSICAL EXAMINATION:  GENERAL APPEARANCE:  No acute distress.  He is alert  and oriented x3.  SKIN:  Warm and dry.  VITAL SIGNS:  Blood pressure is 134/86, pulse 68 and sinus rhythm,  respiratory rate 18, he is afebrile.  Saturations are 96%.  HEENT:  Unremarkable.  NECK:  Carotid upstrokes equal bilaterally without  bruits.  No thyromegaly.  Trachea is midline.  LUNGS:  Clear to auscultation.  CARDIOVASCULAR:  Soft S1 and S2 without murmurs, rubs, or gallops.  ABDOMEN:  Soft, protuberant.  There is no midline bruit.  There is no  tenderness.  There is no hepatomegaly.  EXTREMITIES:  There are some old areas with superficial scabs that look like  old injury.  His skin is shiny.  There is no edema.  Pulses are brisk  bilaterally and symmetrical.  NEUROLOGIC:  Examination is intact.   His chest x-ray shows cardiomegaly.   EKG shows sinus rhythm with no specific EKG changes.  There may be some mild  voltage criteria for LVH.   Laboratory data is pending.   ASSESSMENT:  Known coronary artery disease with multiple interventions plus  posterior infarct in 1997.  He now presents with chest pain reminiscent of  his previous angina.  I am admitting him to the hospital and will proceed  with diagnostic catheterization tomorrow.  This was discussed with the  patient and his wife.   PLAN:  1. Telemetry.  2. Continue current medications.  3. Begin heparin but try to enroll in the acuity trial tomorrow.  Victorino Dike,     our clinical research nurse, was called and she will try to enroll him in     the morning.                                               Thomas C. Wall, M.D.    TCW/MEDQ  D:   09/02/2002  T:  09/03/2002  Job:  [id number]   cc:   [name], M.D.    cc:   [name], M.D.

## 2010-07-06 NOTE — Discharge Summary (Signed)
NAME:  [name], [name]                          ACCOUNT NO.:  [id number]   MEDICAL RECORD NO.:  [id number]                   PATIENT TYPE:  INP   LOCATION:  6526                                 FACILITY:  [facility]   PHYSICIAN:  [name], M.D.                DATE OF BIRTH:  1940-12-05   DATE OF ADMISSION:  09/02/2002  DATE OF DISCHARGE:  09/04/2002                           DISCHARGE SUMMARY - REFERRING   PROCEDURES:  Coronary angiogram on September 03, 2002.   REASON FOR ADMISSION:  [name] is a 70 year old male with known coronary  artery disease, followed by [name], M.D., who presented to the  emergency room with symptoms suggestive of unstable angina pectoris.  Please  refer to the dictated admission note for full details.   LABORATORY DATA:  Cardiac enzymes:  Normal serial troponin I markers and  negative total CPK.  Myoglobin 495.  Lipid profile:  Total cholesterol 144,  triglycerides 162, HDL 35, LDL 77 (ratio 4.1).  Iron profile:  Decreased  iron level at 20, normal TIBC at 378, decreased percent saturation at 5,  decreased ferritin level at 18.  Elevated C-reactive protein at 3.8.  Hemoglobin 10.4 on admission with MCV 75 and platelets 345 and hemoglobin  9.9 and platelets 332 at discharge.  Electrolytes and renal function normal.   Chest x-ray:  No acute disease.   HOSPITAL COURSE:  The patient presented with symptoms worrisome for unstable  angina pectoris and was placed on intravenous heparin and Plavix with  continuation of aspirin and beta blocker.  The patient was referred to the  ACUITY trial.   Following admission, the patient ruled out for MI with negative serial  cardiac enzymes.  The recommendation was to proceed with relook coronary  angiography.   Coronary angiography performed by [name], M.D., on September 03, 2002  (refer for full detail), revealed noncritical coronary artery disease with  normal left ventricular function.  Additionally, a distal  aortogram was  normal.   Dr. Juanda Chance concluded that there was no obvious source of ischemia and  suspected that the patient's symptoms were secondary to gastrointestinal  reflux disease.  The patient was therefore started on Protonix.  The  recommendation is to return to his primary care physician, Stacie Glaze,  M.D., to pursue thorough work up for microcytic anemia.   MEDICAL ADJUSTMENTS:  Addition of Protonix.   MEDICATIONS AT DISCHARGE:  1. Protonix 40 mg daily (new).  2. Toprol XL 100 mg daily.  3. Aspirin 81 mg daily.  4. Zocor 20 mg daily.   ACTIVITY:  No heavy lifting/driving x 2 days.  The patient is advised to not  return to work for one week.   DIET:  Low-fat, low-cholesterol diet.   WOUND CARE:  Call the office if there is any swelling/bleeding of the groin.   FOLLOWUP:  The patient is instructed to arrange followup  with his primary  care physician, Stacie Glaze, M.D., in the ensuing one to two weeks for  subsequent anemia evaluation.  The patient will return to [name],  M.D., as previously scheduled.   DISCHARGE DIAGNOSES:  1. Non-ischemic chest pain.     a. Negative serial cardiac markers.     b. Nonobstructive coronary artery disease on coronary angiogram on September 03, 2002.     c. Status post previous stent to right coronary artery and percutaneous        transluminal coronary angiography of circumflex.     d. Normal left ventricular function.  2. Microcytic anemia.     a. Iron deficiency anemia.  3. Dyslipidemia.     Gene Serpe, P.A. [facility]                      Thomas C. Wall, M.D.    GS/MEDQ  D:  09/04/2002  T:  09/05/2002  Job:  [id number]   cc:   Stacie Glaze, M.D. [facility]    cc:   Stacie Glaze, M.D. [facility]

## 2010-07-06 NOTE — Op Note (Signed)
NAME:  [name], [name]                ACCOUNT NO.:  [id number]   MEDICAL RECORD NO.:  [id number]          PATIENT TYPE:  AMB   LOCATION:  [facility]                         FACILITY:  [facility]   PHYSICIAN:  Marlowe Kays, M.D.  DATE OF BIRTH:  05/14/1940   DATE OF PROCEDURE:  07/04/2005  DATE OF DISCHARGE:                                 OPERATIVE REPORT   PREOPERATIVE DIAGNOSIS:  1.  Torn medial meniscus.  2.  Osteoarthritis right knee.   POSTOPERATIVE DIAGNOSIS:  1.  Torn medial and lateral menisci.  2.  Osteoarthritis right knee.   OPERATION:  Right knee arthroscopy with:  1.  Partial medial and lateral meniscectomies.  2.  Shave of medial and lateral femoral condyles.   SURGEON:  Marlowe Kays, M.D.   ASSISTANT:  Nurse   ANESTHESIA:  General.   INDICATIONS FOR PROCEDURE:  He has had a total knee replacement on the left  and has had pain, swelling, and instability in the right knee beginning  around April 1.  MRI has demonstrated an ACL deficient knee with a tear of  the posterior horn of the medial meniscus and tricompartmental arthritis.  It was felt that the lateral meniscus was normal.   OPERATIVE FINDINGS:  At surgery, he had tears of both the anterior and  posterior horns of the medial meniscus, radial type tear of the anterior and  mid third of the lateral meniscus which was quite substantial, and the  tricompartmental arthritis noted on the MRI.   PROCEDURE:  Prophylactic antibiotic, satisfactory general anesthesia,  pneumatic tourniquet.  The right leg Esmarch out non-sterilely and Ace wrap  to the left leg with knee support, the total knee replacement was protected.  The right leg was prepped from the thigh stabilizer to the toes with  DuraPrep and draped in a sterile field.  The anatomy of the joint was marked  out and the superior medial saline inflow.  First, through an anterolateral  portal and medial compartment, the knee joint was evaluated. The disruption  of the anterior medial meniscus was noted and shaved down until smooth. The  medial femoral condyle was debrided down until smooth, in some areas it was  full-thickness chondromalacia. The entire posterior portion of the medial  meniscus was trimmed back with baskets and shaved down to a smooth stable  rim with a 3.5 shaver.  Final pictures were taken. I then tried to get in  the medial gutter in the suprapatellar area but was unable to get the scope  through this direction because of spurring. Consequently, I reversed portals  and through the anteromedial portal, attempted to look at the lateral  compartment of the knee joint.  His ACL was partially torn.  There was  fairly significant disruption of the lateral meniscus anteriorly blocking  the joint.  This was pictured and I cut through the radial tear with  scissors and then began and removed the torn portion with 3.5 shaver  continuing the shaving back to the mid portion of the lateral meniscus.  He  had significant wear of the lateral tibial plateau  which did not require  shaving but the lateral femoral condyle did and I shaved this down as well.  After completing the meniscus repair, I then looked up the lateral gutter in  the suprapatellar area.  He had full thickness wear in some areas of the  patella, nothing that was required shaving. The knee joint was irrigated  until clear and all fluid possible removed. The portals were closed with 4-0  nylon.  I injected through the inflow apparatus 20 mL of 0.5% Marcaine with  adrenalin and 4 mg morphine.  I then closed this portal with 4-0 nylon, as  well.  Betadine and dry, sterile dressing were applied.  The tourniquet was  released.  He tolerated the procedure well.  At the time of this dictation,  he is on his way to the recovery room in satisfactory condition with no  known complications.           ______________________________  Marlowe Kays, M.D.     JA/MEDQ  D:  07/04/2005   T:  07/04/2005  Job:  [id number]

## 2010-07-06 NOTE — Op Note (Signed)
NAME:  [name], [name]                ACCOUNT NO.:  [id number]   MEDICAL RECORD NO.:  [id number]          PATIENT TYPE:  AMB   LOCATION:  DAY                          FACILITY:  [facility]   PHYSICIAN:  Marlowe Kays, M.D.  DATE OF BIRTH:  [birth year]   DATE OF PROCEDURE:  02/12/2006  DATE OF DISCHARGE:                               OPERATIVE REPORT   PREOPERATIVE DIAGNOSIS:  Spinal stenosis at L2-3, L3-4 and L4-5 right.   POSTOPERATIVE DIAGNOSIS:  Spinal stenosis at L2-3, L3-4 and L4-5 right.   OPERATION:  Central and foraminal decompression L2-3, L3-4, and L4-5.   SURGEON:  Dr. Marlowe Kays.   ASSISTANT:  Dr. Worthy Rancher.   ANESTHESIA:  General.   PATHOLOGY AND JUSTIFICATION FOR PROCEDURE:  He has had several prior  back surgeries including fusion at L5-S1 and surgery which was probably  decompression and perhaps microdiskectomy at L3-4. Records were not  available to confirm this.  MRIs demonstrated no recurrent disk  herniation.  He is having back and bilateral leg pain and some residual  numbness the left foot over the S1 nerve root distribution. MRI has  demonstrated no recurrent disk herniation but a myelogram CT scan has  demonstrated significant spinal stenosis of L2-3, L3-4 and somewhat at  L4-5 on the right.  At surgery, he was also very tight at L4-5 on the  left and hence we did the full decompression at all three levels.   DESCRIPTION OF PROCEDURE:  Prophylactic antibiotic, satisfactory general  anesthesia, prone position on the Wilson frame, back was prepped with  DuraPrep, draped in a sterile field.  Ioban employed. I went through the  superior portion of the old incision as well as extending it proximally.  The spinous process which I felt was most likely L2 was identified,  tagged with a Kocher clamp and identified as L2. By palpation and also  on this x-ray, he appeared to have absence of the spinous process at L3.  We then worked distally going down to what we  thought was L4 and tagged  this with a Kocher clamp and a second lateral x-ray, identified what was  left of the spinous process of L4. We thus had a working region.  We  cleared soft tissue off the palpable bone laterally from L2 to L4,  placed self-retaining McCullough retractors.  We then removed a portion  of the spinous process of L2 but eventually had removed the entire  spinous process in order to get thorough decompression.  Working beneath  the neural arch of L2 with 2 and then later 3 mm Kerrison rongeurs as  well as double-action rongeur, I decompressed the dura at this level .  We were then able to work distally working laterally and then centrally  removing bone where appropriate and ligamentum flavum working all the  way down to L4-5 where we were unable to proceed further due to  adhesions between the bone and dura.  At this point, we brought in the  microscope and completed the decompression t L3 levels.  The wound was  irrigated with sterile  saline.  Gelfoam was placed over the dura and I  used a 1/4 inch Penrose drain through the left posterior distal low  back. I then closed the wound carefully under direct visualization so as  not to include the Penrose drain with interrupted #1 Vicryl and the  paralumbar muscle and fascia as well as the deep  subcutaneous tissue, 2-0 Vicryl superficially and staples in the skin.  Betadine Adaptic dry sterile dressing were applied. He tolerated the  procedure well and was taken to the recovery room in satisfactory  condition with no known complications.  Estimated blood loss was 550 mL.  No blood replacement.           ______________________________  Marlowe Kays, M.D.     JA/MEDQ  D:  02/12/2006  T:  02/12/2006  Job:  [id number]

## 2010-07-06 NOTE — Op Note (Signed)
[facility]  Patient:    RANDEE, UPCHURCH Visit Number: [id number] MRN: [id number]          Service Type: SUR Location: [id number] Attending Physician:  Marlowe Kays Page Dictated by:   Illene Labrador. Aplington, M.D. Proc. Date: 06/18/01 Admit Date:  06/18/2001 Discharge Date: 06/19/2001                             Operative Report  PREOPERATIVE DIAGNOSES: 1. Central and lateral recess stenosis L3-4. 2. Lateral recess stenosis L4-5 with foraminal stenosis L4-5 right, status    post diskectomy.  POSTOPERATIVE DIAGNOSES: 1. Central and lateral recess stenosis L3-4. 2. Lateral recess stenosis L4-5 with foraminal stenosis L4-5 right, status    post diskectomy.  PROCEDURE:  Central decompressive laminectomy L3-4 and L4-5 with foraminal decompression L4 and L5 nerve roots bilaterally.  SURGEON:  Illene Labrador. Aplington, M.D.  ASSISTANTS:  Almedia Balls. Ranell Patrick, M.D., and Ottie Glazier. Wynona Neat, P.A.-C.  ANESTHESIA:  General.  PATHOLOGY AND JUSTIFICATION FOR PROCEDURE:  He had a back fusion in 1971 done elsewhere and has two lateral incisions, lower lumbar spine, as a result of it.  In 2000, three years ago, he had a microdiskectomy at L4-5 by a neurosurgeon here in town.  He initially did well for about six months but over the last several years has had more in the way of low back and primarily right leg pain, but just prior to surgery today he said his left leg has been giving him problems for a week although not nearly as severe as his right.  He has marked spondylosis on his plain x-rays.  A myelogram and CT scan was performed on May 11, 2001, which showed that his primary problem seemed to be lateral recess stenosis at L3-4 and at L4-5 with foraminal stenosis at L4-5 on the right, the side of his prior microdiskectomy.  I planned initially just to decompress him on the right side and perform a foraminotomy at L4-5 on the right in particular but with  his complaints of left leg pain in addition which corresponded to the myelographic findings as well, I felt that he would need decompression of the left side as well today.  DESCRIPTION OF PROCEDURE:  Prophylactic antibiotics.  Placed on the Wilson frame so as to avoid any stress on his left knee, which has had knee replacement.  The back was prepped with Duraprep, draped in a sterile field. I initially used the previous 4-5 diskectomy incision and extended it slightly superiorly and then tagged the three spinous processes with clamps, thinking that they were L3, L4, and L5.  A lateral x-ray was taken confirming that this was indeed the case.  We then carefully dissected the soft tissue off the laminae of L3, L4, and the superior portion of L5 with particular care at L4-5 on the right.  There was a good bit of postoperative scar present there. Self-retaining McCullough retractors were then inserted.  With a double-action rongeur, I removed the spinous processes of L3 and L4 and a little bit of the inferior of L2 and superior of L5.  Then with a combination of Kerrison rongeurs and double-action rongeurs, we then began removing bone centrally, working laterally, starting first at the untouched area at L3-4, then working distally mainly on the left side.  He had a good bit of ligamentum flavum and scar adherent to the dura due to the  previous surgery and rather than risk a dural tear, I elected to leave this scar in place with emphasis being on working laterally on the right side in particular because of his lateral recess stenosis.  When we had completed as much dissection as we could without the scope, I brought in the microscope and we polished up the decompression on both sides, performing wide foraminotomies to hockey stick to the L4 nerve roots and then moved down distally to both the L5 nerve roots.  In particular, the L4-5 foramen was patent to nerve hook at the conclusion of  the decompression with the nerve being visualized.  The CT scan had shown a little posterolateral disk protrusion at L3-4 on the right.  I did not feel it was very impressive on the CT scan to my own reading and with his decompression, I elected not to pursue it because I did not want to run any risk of destabilizing it, and again the disk was not impressive and with a decompression I felt that the L4 nerve root did not have any compression on it.  He had a general ooze throughout the case.  Placed a Penrose drain and Gelfoam-soaked thrombin around the perimeter of the bone dissection and on the dura.  The wound was then closed in layers with direct visualization to avoid spearing the Penrose drain with interrupted #1 Vicryl in the paralumbar muscle and fascia and the deep subcutaneous tissue, 2-0 Vicryl in the superficial subcutaneous tissue, and staples in the skin.  Betadine, Adaptic, and a dry sterile dressing were applied.  He tolerated the procedure well, was gently placed on his bed and taken to recovery in satisfactory condition with no known complications.  Estimated blood loss was approximately 500 cc, no blood replacement. Dictated by:   Illene Labrador. Aplington, M.D. Attending Physician:  Joaquin Courts DD:  06/18/01 TD:  06/20/01 Job: [id number]

## 2010-07-06 NOTE — Cardiovascular Report (Signed)
NAME:  [name], [name]                          ACCOUNT NO.:  [id number]   MEDICAL RECORD NO.:  [id number]                   PATIENT TYPE:  INP   LOCATION:  2015                                 FACILITY:  [facility]   PHYSICIAN:  [name], M.D.                  DATE OF BIRTH:  January 22, 1941   DATE OF PROCEDURE:  09/03/2002  DATE OF DISCHARGE:                              CARDIAC CATHETERIZATION   CLINICAL HISTORY:  The patient is 70 years old and had a posterior wall  myocardial infarction in 1997 complicated by ventricular fibrillation and  treated with PTCA of the marginal branch to the circumflex artery as part of  the stent __________ trial.  He subsequently had stenting of the right  coronary artery by [name]. Stuckey, M.D. in 1999 with a 3.5 x 28 mm Duet  stent.  He was recently admitted with symptoms of indigestion which then  became more prolonged.  He was seen by [name], M.D. and admitted  with a diagnosis of unstable angina and enrolled in the ACUITY trial. He was  randomized to bivalirudin plus up front Integrilin.   PROCEDURE:  The procedure was performed via the right femoral artery using  arterial sheath and 6-French preformed coronary catheters.  A front wall  arterial puncture was performed and Omnipaque contrast was used.  After  completion distal aortogram was performed to rule out abdominal aortic  aneurysm.  The patient tolerated procedure well and left the laboratory in  satisfactory condition.   RESULTS:  Aortic pressure was 147/85 with a mean of 110.  Left ventricular  pressure was 147/29.   Left main coronary artery:  Free of significant disease.   Left anterior descending artery:  Gave rise to two diagonal branches and two  septal perforators.  There were 40 and 50% stenoses and irregularity in the  proximal LAD.  There was 50% narrowing in the first diagonal branch.   Circumflex artery:  The circumflex artery gave rise to an intermediate  branch,  small and large marginal branch, and a posterolateral branch.  These  vessels were free of significant disease.   Right coronary artery:  The right coronary artery was a moderately large  vessel that gave rise to a right ventricular branch, a posterior descending  branch, and three posterolateral branches.  There was 40% narrowing in the  proximal right coronary artery.  There was 50% narrowing within the stent in  the mid right coronary artery.  The distal vessel was free of significant  obstruction.   LEFT VENTRICULOGRAM:  The left ventriculogram performed in the RAO  projection showed good wall motion with no areas of hypokinesis.  The  estimated ejection fraction was 60%.   DISTAL AORTOGRAM:  A distal aortogram was performed which showed patent  renal artery on the left and the right renal artery arose just above the  bifurcation  and was also patent.  There was no significant aortoiliac  obstruction.   CONCLUSIONS:  Coronary artery disease status post previous percutaneous  interventions as described above with 40 and 50% narrowing in the proximal  left anterior descending, 40% proximal narrowing in the right coronary  artery and 50% narrowing within the stent in the mid right coronary artery,  and normal left ventricular function.    RECOMMENDATIONS:  There does not appear to be any source of ischemia.  In  view of these findings I suspect that symptoms are probably not related to a  myocardial ischemia and are more probably related to gastroesophageal  reflux.  Will treat him for that.                                               [name], M.D.    BB/MEDQ  D:  09/03/2002  T:  09/06/2002  Job:  [id number]  [name], M.D. [facility]   Cardiopulmonary Lab   cc:   [name], M.D. [facility]   Cardiopulmonary Lab

## 2010-07-06 NOTE — Op Note (Signed)
[place]. [facility]  Patient:    [name], [name]                       MRN: [id number] Proc. Date: 04/03/99 Adm. 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  PROCEDURE:  Revision of tibia component left total knee replacement.  SURGEON:  Illene Labrador. Aplington, M.D.  ASSISTANT:  Georges Lynch. Darrelyn Hillock, M.D.  ANESTHESIA:  General.  INDICATIONS:   Knee replacement was done 6-7 years ago.  He is a large man and t age 70 as of this moment has been very active on the knee.  The basic pathology was that he had collapse of his neotibial plateau as discussed below.  PROCEDURE:  Prophylactic antibiotics, satisfactory general anesthesia, pneumatic tourniquet, folded sheets beneath the left buttock.  The left leg was prepped with Duraprep and draped in a sterile field.  The old surgical incision was first marked out and then [place] employed and we went through the old surgical incision. Median peripatellar incision to open the joint.  His patellar mechanism was quite tight despite freeing it up as much as I felt comfortable at the tibial tubercle and because of this, I made a little oblique full-thickness cut through the quadriceps tendon, which allowed [place] to evert the patella and flex the knee.  Knee joint was thoroughly debrided.  This included bone and synovium around the patella and synovitis around the femur.  I undermined the medial collateral ligament and the ______  medially, which gave [place] exposure to the medial side of the prosthesis and a chunk of bone and methyl methacrylate was quite loose from the medial posterior portion of the tibial plateau consistent with collapse at this location.  This as removed with sharp  dissection.  Then, freed him up laterally.  First, removed the polyethylene spacer and then we were able to remove the tray with minimal glue attached to the stem.  Next, with a combination of instruments, which included curets, osteotome and pituitary and Kocher instruments, I was able to remove the methyl methacrylate from the tibial canal and then place a canal finder down to the ankle.  Then, placed an intramedullary rod, which we aligned up the placement for the proximal tibial cut for a 0 degree cut and split the bimalleolar distance with an external rod.  Then, basically made a leveling cut to bring the lateral tibia down to the level of the medial.  Once this had been accomplished, I made a trial reduction of the tibial component using the tibial base plate 9, which was what we had preoperatively and various spacer, eventually going to a 21 mm spacer.  We sed the external alignment rod, splitting the bimalleolar distance to mark scribe lines on the tibia.  We then placed the tibia base plate and began reaming, but found  that we were unable to get the reamer as vertical as we wanted to because of the posterior tibial cortex.  Accordingly, we had to revise the way we were managing things and we simply reamed free-hand up to a 14 mm with my intention to use a 55 mm stem, which brought [place] well  past the lower level of the previous glue. After  reaming for this, we then placed the trial stem and on top of this, the cutting  jig, so that we were able to revise the tibial cut slightly to adjust to the tibial canal rather than the reverse.  After this had been completed, once we placed the trial prosthesis, impacting it nicely and it fit flush throughout the entire proximal tibia, except for slightly posterior and medially.  Felt this could be  corrected with some additional methyl methacrylate support.  Then, went ahead and waterpicked the tibia and mixed the methyl  methacrylate.  At this point, we were at 2 hours of tourniquet time and I elected to exceed this slightly in order to be  able to give a nice glued in tibial component prior to any bleeding.  The methyl methacrylate was dispensed with a glue gun on the tibial plateau and in the canal and the tibial component impacted nicely.  Just prior to introducing the final component however, we also rekeeled for the tibial component with the tray. While the methyl methacrylate was hardening, we trimmed up excess methyl methacrylate  building up the posterior medial tibial plateau and I released the tourniquet with 2 hours and 14 minutes of tourniquet time.  Several small bleeders were coagulated.  There was no unusual bleeding.  I also performed a lateral release at this time while waiting for the methyl methacrylate to harden.  Then, removed the trial 18 spacer, which we had used while the methyl methacrylate was hardening and went o a 21, which was indeed the better fit, giving nice flexion, extension and nice medial and lateral stability with the patella gliding in the midline.  After irrigating the wound well with antibiotic solution, injecting to make sure there were no particular particles of methyl methacrylate remaining, a placed the final 21 mm  tibial spacer, reduced the knee and found it to be nice and stable with excellent flexion and extension.  I did not feel the Hemovac was necessary.  Closure was hen performed.  I closed first the snip in the quadriceps tendon with interrupted #1 Ethibond and then the medial capsule and synovium and quadriceps peripatellar incision with two layers of #1 Vicryl.  Subcutaneous tissue was closed with a combination of #1 and 2-0 Vicryl and skin with staples.  Betadine and dry sterile dressing were reapplied, he was placed in the knee immobilizer and taken to recovery room in satisfactory condition with no known complications. DD:   04/03/99 TD:  04/03/99 Job: [id number]

## 2010-07-06 NOTE — H&P (Signed)
[place]. [facility]  Patient:    [name], [name] Visit Number: [id number] MRN: [id number]          Service Type: SUR Location: [id number] Attending Physician:  Marlowe Kays Page Dictated by:   Alexzandrew L. Perkins, P.A.-C. Admit Date:  06/18/2001 Discharge Date: 06/19/2001   CC:         Philips J. Montez Morita, M.D.  Stacie Glaze, M.D. [facility]  Bruce R. Juanda Chance, M.D. [facility]   History and Physical  CHIEF COMPLAINT:  Back pain, leg pain.  HISTORY OF PRESENT ILLNESS:  The patient is a 70 year old male who has been seen and evaluated for continued back pain by Dr. Illene Labrador. Aplington.  He has been seen and undergone outpatient workup for his radicular component of his back and leg pain.  He has been sent for a myelogram and a CT scan.  These proved to be positive for a spinal stenosis at L3/4 and L4/5.  Due to the significant findings and pronounced pain that he is having, it is felt that he would benefit undergoing surgical intervention.  Risks and benefits have been discussed with the patient and he has elected to proceed with the surgery.  The patient had a significant history of having experienced a myocardial infarction in the past.  He has undergone preoperative clearance.  Dr. Stacie Glaze is his medical physician and Dr. Everardo Beals. Juanda Chance is his cardiologist.  The patient has undergone a recent Cardiolite study.  He has been cleared for his up and coming procedure.  ALLERGIES:  No known drug allergies.  CURRENT MEDICATIONS: 1. Toprol 100 mg p.o. q.d. 2. Zocor 40 mg q.d. 3. Lexapro 10 mg q.d. 4. Flomax 0.4 mg q.d. 5. Hydrocodone p.r.n. pain.  PAST MEDICAL HISTORY:  Coronary arterial disease.  Myocardial infarction in 1998.  Prostate disease.  History of cluster headaches.  Reflux disease. Hiatal hernia.  Osteoarthritis.  Anxiety and depression.  History of bronchitis in February of 2000.  PAST SURGICAL HISTORY:  Previous left total knee  replacement arthroplasty in 1994 with subsequent revision of left total knee in February 2001.  Back fusion in 1971.  A second back surgery in June of 2000.  Cardiac catheterization with angioplasty in 1998 with a second cardiac catheterization with angioplasty and coronary stenting in February of 1999.  Left foot surgery in 2002.  SOCIAL HISTORY:  He is married.  Works as a Engineer, maintenance. Nonsmoker.  No alcohol.  Four children.  His wife will be with him assisting his care following surgery.  He lives in a two story home with one flight of 15 steps.  FAMILY HISTORY:  Mother deceased at age 31 with heart disease.  Father deceased at age 19 with heart disease.  He has a younger sister, age 94, with diabetes.  He has an older sister who is deceased.  She had a history of breast cancer; however, she was killed in a biking accident.  REVIEW OF SYSTEMS:  GENERAL:  No fever, chills, or night sweats.  NEUROLOGIC: The patient does have some anxiety and depression and also cluster headaches; however, no seizures, syncope, or paralysis.  RESPIRATORY:  History of bronchitis back in February of 2000.  No shortness of breath, productive cough, or hemoptysis.  CARDIOVASCULAR:  No chest pain, angina, or orthopnea. History of angina in the past with previous MI.  No recent chest pain. GASTROINTESTINAL:  No nausea, vomiting, diarrhea, constipation, or bloody mucus in the stool.  GENITOURINARY:  No dysuria, hematuria, or discharge.  He does have some nocturia.  MUSCULOSKELETAL:  Pertinent in the back and the legs as found in the history of present illness.  PHYSICAL EXAMINATION:  VITAL SIGNS:  Pulse 78, respirations 12, blood pressure 142/82.  GENERAL:  The patient is a 70 year old white male, well-developed, well-nourished.  Appears to be in no acute distress.  He is alert, oriented, cooperative, and pleasant at the time of exam.  Appears his stated age.  HEENT:  Pupils are equal, round  and reactive.  Oropharynx is clear.  EOMs are intact.  The patient does have upper and lower denture plates.  NECK:  Supple.  No carotid bruits are appreciated.  CHEST:  Clear to auscultation in the anterior and posterior chest wall.  There are no rhonchi, rales, or wheezing.  HEART:  Regular rate and rhythm.  No murmurs are appreciated.  S1 and S2 noted.  No rubs, thrills, or palpitations are appreciated.  ABDOMEN:  Soft and nontender.  Bowel sounds are present.  Only slight round abdomen.  No rebound or guarding.  BREASTS:  Not done.  Not pertinent to present illness.  RECTAL:  Not done.  Not pertinent to present illness.  GENITALIA:  Not done.  Not pertinent to present illness.  EXTREMITIES:  The back and legs are essentially nontender over the lumbar spine.  He does walk with a mildly antalgic gait.  Straight leg raise is negative on the right.  He has no pain with rotation on or about the hips. Sensation and motor function is grossly intact throughout the lower extremities.  IMPRESSION: 1. Spinal stenosis. 2. Coronary arterial disease. 3. History of myocardial infarction in 1998. 4. Reflux disease. 5. Hiatal hernia. 6. Osteoarthritis. 7. History of anxiety and depression.  PLAN:  The patient will be admitted to [facility] to undergo a two level decompressive laminectomy to L3/4 and L4/5 with a possible right microdiskectomy at L3/4.  Surgery will be performed by Dr. Illene Labrador. Aplington. The patients medical physician, Dr. Stacie Glaze and his cardiologist Dr. Everardo Beals. Juanda Chance will both be notified of the room on admission and be consulted for medical assistance with this patient throughout the hospital course. Dictated by:   Alexzandrew L. Perkins, P.A.-C. Attending Physician:  Joaquin Courts DD:  06/11/01 TD:  06/11/01  Job: [id number]

## 2010-07-12 ENCOUNTER — Telehealth: Payer: Self-pay | Admitting: Internal Medicine

## 2010-07-12 DIAGNOSIS — M25569 Pain in unspecified knee: Secondary | ICD-10-CM

## 2010-07-12 NOTE — Telephone Encounter (Signed)
Ok to refer per dr Lovell Sheehan

## 2010-07-12 NOTE — Telephone Encounter (Signed)
Triage vm-------wants referral to an ortho dr," Dr Art Buff" in [facility] @ ph----[phone]. Trying to get his knees fit and was highly recommended.

## 2010-07-31 ENCOUNTER — Other Ambulatory Visit: Payer: Self-pay | Admitting: Internal Medicine

## 2010-07-31 DIAGNOSIS — M545 Low back pain, unspecified: Secondary | ICD-10-CM

## 2010-07-31 MED ORDER — MORPHINE SULFATE CR 60 MG PO TB12: 60.0000 mg | ORAL_TABLET | Freq: Two times a day (BID) | ORAL | Status: DC

## 2010-07-31 NOTE — Telephone Encounter (Signed)
Pt needs new rx ms contin 60mg .

## 2010-07-31 NOTE — Telephone Encounter (Signed)
done

## 2010-08-03 ENCOUNTER — Telehealth: Payer: Self-pay | Admitting: Cardiology

## 2010-08-03 NOTE — Telephone Encounter (Signed)
Faxed Stress to [facility] - Dr. Vonna Kotyk" [name] ([id number]).

## 2010-08-24 ENCOUNTER — Other Ambulatory Visit: Payer: Self-pay | Admitting: Orthopedic Surgery

## 2010-08-24 ENCOUNTER — Encounter (HOSPITAL_COMMUNITY): Payer: Medicare Other

## 2010-08-24 LAB — BASIC METABOLIC PANEL
BUN: 29 mg/dL — ABNORMAL HIGH (ref 6–23)
CO2: 27 mEq/L (ref 19–32)
Calcium: 9.4 mg/dL (ref 8.4–10.5)
Chloride: 104 mEq/L (ref 96–112)
Creatinine, Ser: 1.17 mg/dL (ref 0.50–1.35)
GFR calc Af Amer: >60 mL/min (ref >60)
GFR calc non Af Amer: >60 mL/min (ref >60)
Glucose, Bld: 120 mg/dL — ABNORMAL HIGH (ref 70–99)
Potassium: 4 mEq/L (ref 3.5–5.1)
Sodium: 138 mEq/L (ref 135–145)

## 2010-08-24 LAB — PROTIME-INR
INR: 1.18 (ref 0.00–1.49)
Prothrombin Time: 15.2 seconds (ref 11.6–15.2)

## 2010-08-24 LAB — CBC
Hemoglobin: 12.9 g/dL — ABNORMAL LOW (ref 13.0–17.0)
MCH: 30.2 pg (ref 26.0–34.0)
MCHC: 33.2 g/dL (ref 30.0–36.0)
Platelets: 164 10*3/uL (ref 150–400)

## 2010-08-24 LAB — SURGICAL PCR SCREEN
MRSA, PCR: NEGATIVE
Staphylococcus aureus: NEGATIVE

## 2010-08-30 NOTE — H&P (Addendum)
NAME:  [name], [name] NO.:  [id number]  MEDICAL RECORD NO.:  [id number]  LOCATION:                               FACILITY:  [facility]  PHYSICIAN:  Marlowe Kays, M.D.  DATE OF BIRTH:  10-29-1940  DATE OF ADMISSION:  09/05/2010 DATE OF DISCHARGE:                             HISTORY & PHYSICAL   CHIEF COMPLAINT:  "Pain in my right knee."  PRESENT ILLNESS:  This is a 70 year old white male who has been seen by [place] for continuing progressive problems concerning his right knee.  We have tried conservative care with his knee which really has not been effective due to the deteriorated condition of the knee itself.  He has undergone a successful left total knee and now has to ambulate with a cane and he is highly desirous to have the right knee replaced.  After much discussion including risks, benefits of surgery, he decided to ahead with total knee replacement arthroplasty on the right.  He has recently been seen by his cardiologist who has given him surgical clearance for this surgery.  ALLERGIES:  Denies any medical allergies to any food, latex or metal allergies.  He does have difficulty with Tylenol, must keep it low due to his elevation liver enzymes.  Dr. Darryll Capers is his family physician as well as Dr. Clarene Duke at [facility].  CURRENT MEDICATIONS: 1. Gabapentin 300 mg one daily. 2. Low-dose aspirin 81 mg daily (will stop for surgery). 3. Zocor 40 mg daily. 4. Bisoprolol one-half tablet daily. 5. Zantac daily. 6. Morphine 30 mg at bedtime (for chronic lower back pain).  PAST MEDICAL HISTORY:  The patient had a medical history of hypertension, history of bronchitis in the past.  He had a heart attack back in 1998.  He has had left knee arthroscopy in 1994 as well as in the year 2000.  He has had carpal tunnel release of both hands.  May 2010, did right shoulder surgery.  September 2011, left knee replacement.  Macular degeneration, has a heart stent  surgically implanted in the past.  SOCIAL HISTORY:  The patient is married, retired Curator, smokes cigars for 5 years 1 to 2 per week in the past.  No intake of EtOH.  He lives with his wife and is retired.  FAMILY HISTORY:  Positive for heart attack both in mother and father and relatively healthy siblings.  REVIEW OF SYSTEMS:  CNS:  No seizures, syncope, paralysis, double vision.  RESPIRATORY:  No productive cough.  No hemoptysis or shortness of breath.  GASTROINTESTINAL:  The patient has reflux disease, does well with his medications.  No nausea, vomiting, melena or bloody stools. GENITOURINARY:  No discharge, dysuria, hematuria.  PHYSICAL EXAMINATION:  GENERAL:  Alert, cooperative, friendly 70 year old white male who walks with a cane in most decided limp. VITAL SIGNS:  Blood pressure 120/68 seated right arm, pulse 68 and regular, respirations 12 and unlabored. HEENT:  Normocephalic.  PERRLA.  EOM intact.  Oropharynx is clear. CHEST:  Clear to auscultation.  No rhonchi or rales. HEART:  Regular rate and rhythm.  No murmurs are heard. ABDOMEN:  Soft, nontender.  Liver and spleen not felt. GENITALIA  AND RECTAL:  Not done and not part of present illness. EXTREMITIES:  The patient has large swelling and valgus deformity of the right knee.  Has no erythema.  No evidence of infection.  ADMISSION DIAGNOSES: 1. Osteoarthritis of the right knee. 2. Hypertension. 3. Reflux. 4. Coronary artery disease. 5. Macular degeneration. 6. Status post low back surgery. 7. Status post left total knee replacement arthroplasty.  PLAN:  The patient to undergo right total knee replacement arthroplasty. He may be able to go home with home physical therapy or we may need to investigate a rehab program.  Will certainly see how he does throughout his hospitalization.  If he has any medical problems, will certainly call Dr. Darryll Capers or one of his associates to help [place] during this patient's  hospitalization.     Geno Sydnor L. Cherlynn June.   ______________________________ Marlowe Kays, M.D.    DLU/MEDQ  D:  08/27/2010  T:  08/27/2010  Job:  [id number]  cc:   Stacie Glaze, MD [address] [place] [address]  Electronically Signed by Marlowe Kays M.D. on 09/21/2010 04:19:34 PM Electronically Signed by Alvera Novel  on 09/21/2010 04:20:50 PM

## 2010-09-05 ENCOUNTER — Inpatient Hospital Stay (HOSPITAL_COMMUNITY)
Admission: RE | Admit: 2010-09-05 | Discharge: 2010-09-11 | DRG: 470 | Disposition: A | Discharge status: Skilled Nursing Facility | Payer: Medicare Other | Source: Ambulatory Visit | Attending: Orthopedic Surgery | Admitting: Orthopedic Surgery

## 2010-09-05 ENCOUNTER — Inpatient Hospital Stay (HOSPITAL_COMMUNITY): Payer: Medicare Other

## 2010-09-05 DIAGNOSIS — G8918 Other acute postprocedural pain: Secondary | ICD-10-CM | POA: Diagnosis not present

## 2010-09-05 DIAGNOSIS — Z91199 Patient's noncompliance with other medical treatment and regimen due to unspecified reason: Secondary | ICD-10-CM

## 2010-09-05 DIAGNOSIS — F1120 Opioid dependence, uncomplicated: Secondary | ICD-10-CM | POA: Diagnosis present

## 2010-09-05 DIAGNOSIS — Z9861 Coronary angioplasty status: Secondary | ICD-10-CM

## 2010-09-05 DIAGNOSIS — G4733 Obstructive sleep apnea (adult) (pediatric): Secondary | ICD-10-CM | POA: Diagnosis present

## 2010-09-05 DIAGNOSIS — Z01812 Encounter for preprocedural laboratory examination: Secondary | ICD-10-CM

## 2010-09-05 DIAGNOSIS — M1710 Unilateral primary osteoarthritis, unspecified knee: Principal | ICD-10-CM | POA: Diagnosis present

## 2010-09-05 DIAGNOSIS — Z9119 Patient's noncompliance with other medical treatment and regimen: Secondary | ICD-10-CM

## 2010-09-05 DIAGNOSIS — I10 Essential (primary) hypertension: Secondary | ICD-10-CM | POA: Diagnosis present

## 2010-09-05 DIAGNOSIS — F99 Mental disorder, not otherwise specified: Secondary | ICD-10-CM | POA: Diagnosis not present

## 2010-09-05 DIAGNOSIS — R4182 Altered mental status, unspecified: Secondary | ICD-10-CM | POA: Diagnosis not present

## 2010-09-05 DIAGNOSIS — F19939 Other psychoactive substance use, unspecified with withdrawal, unspecified: Secondary | ICD-10-CM | POA: Diagnosis not present

## 2010-09-05 DIAGNOSIS — I252 Old myocardial infarction: Secondary | ICD-10-CM

## 2010-09-05 DIAGNOSIS — D62 Acute posthemorrhagic anemia: Secondary | ICD-10-CM | POA: Diagnosis not present

## 2010-09-05 DIAGNOSIS — I2510 Atherosclerotic heart disease of native coronary artery without angina pectoris: Secondary | ICD-10-CM | POA: Diagnosis present

## 2010-09-05 HISTORY — PX: TOTAL KNEE ARTHROPLASTY: SHX125

## 2010-09-05 LAB — PROTIME-INR: Prothrombin Time: 15.4 seconds — ABNORMAL HIGH (ref 11.6–15.2)

## 2010-09-05 LAB — CBC
MCH: 28.6 pg (ref 26.0–34.0)
Platelets: 197 10*3/uL (ref 150–400)
RBC: 4.12 MIL/uL — ABNORMAL LOW (ref 4.22–5.81)
RDW: 13.5 % (ref 11.5–15.5)

## 2010-09-05 LAB — TYPE AND SCREEN
ABO/RH(D): B POS
Antibody Screen: NEGATIVE

## 2010-09-06 LAB — BASIC METABOLIC PANEL
BUN: 17 mg/dL (ref 6–23)
Calcium: 9.2 mg/dL (ref 8.4–10.5)
Creatinine, Ser: 1.16 mg/dL (ref 0.50–1.35)
GFR calc Af Amer: >60 mL/min (ref >60)
GFR calc non Af Amer: >60 mL/min (ref >60)
Glucose, Bld: 117 mg/dL — ABNORMAL HIGH (ref 70–99)
Potassium: 5 mEq/L (ref 3.5–5.1)

## 2010-09-06 LAB — CBC
MCH: 29.8 pg (ref 26.0–34.0)
MCHC: 33.1 g/dL (ref 30.0–36.0)
MCV: 90 fL (ref 78.0–100.0)
Platelets: 210 10*3/uL (ref 150–400)
RBC: 3.99 MIL/uL — ABNORMAL LOW (ref 4.22–5.81)
RDW: 13.6 % (ref 11.5–15.5)

## 2010-09-06 LAB — PROTIME-INR: Prothrombin Time: 16.8 seconds — ABNORMAL HIGH (ref 11.6–15.2)

## 2010-09-06 NOTE — Op Note (Signed)
NAMEMarland [name]  RICHMOND, COLDREN NO.:  [id number]  MEDICAL RECORD NO.:  [id number]  LOCATION:  0002                         FACILITY:  [facility]  PHYSICIAN:  Marlowe Kays, M.D.  DATE OF BIRTH:  November 08, 1940  DATE OF PROCEDURE:  09/05/2010 DATE OF DISCHARGE:                              OPERATIVE REPORT   PREOPERATIVE DIAGNOSIS:  Osteoarthritis, right knee.  POSTOPERATIVE DIAGNOSIS:  Osteoarthritis, right knee.  OPERATION:  Osteonics total knee replacement, right  SURGEON:  Marlowe Kays, M.D.  ASSISTANT:  Mr. Idolina Primer, [facility].  ANESTHESIA:  General.  PLAN/JUSTIFICATION FOR PROCEDURE:  He has had a successful total knee replacement on the left.  On the right, he has bone-on-bone abutment lateralward with medial shift of the femur on the tibia.  He has had frequent falls because of his pain.  He has been cleared by Cardiology for the surgery.  DESCRIPTION OF PROCEDURE:  Prophylactic antibiotics, satisfied general anesthesia, pneumatic tourniquet, lateral hips stabilizer and shear foot, right leg was prepped with DuraPrep from tourniquet to ankle and draped in sterile field.  Ioban employed.  Leg was Esmarch out sterilely and tourniquet inflated to 300 mmHg.  Time-out performed.  Vertical midline incision down to the patellar mechanism with median parapatellar incision opened the joint.  I undermined the pace anserinus and medial collateral ligament off the tibia and freed up the patellar mechanism everting the patella and flexing the knee.  Extensive osteophytes were removed from around the patella and the femur and anterior portions of menisci were removed.  I made a 0.25-inch drill hole in the distal femur followed by the canal finder and an axis aligner for a 5-degree valgus cut of the right knee.  Because he had flexion contracture, I elected to 12 mm off the distal femur.  We then sized the distal femur with a sizing jig and found that 11 seem to be the best  fit.  Scribe holes were placed to place the distal femoral cutting jig and anterior and posterior cuts and posterior and anterior chamferings were then made.  I then went back to the tibia and made a leveling cut and removed remnants of the ACL and PCL complex and the posterior menisci.  I then made a leveling cut on the proximal tibia and then sized it at probably a 9. Using this baseplate, I made my first intramedullary drill hole followed by the step-cut drill canal finder and the axis aligner rod to make a 90- degree cut 4 mm off the indented posterolateral tibial plateau.  After making this cut, I placed laminar spreader and removed remnants of bone ligamentum from behind the femoral condyles and then we used the jig for creating both the groove for the patella and the notchplasty.  Having accomplished this, I went through a trial reduction, found that 12-mm spacer fit nicely.  Using external rod, I split the bimalleolar distance and the components seemed to be nice and stable at this point.  Using the scribe lines on the tibial component, I marked the tibia based on them.  While the knee was in extension, I sized the patella to 28 and used the 10-mm recess cutting jig to  make 10-mm recess cut.  I then followed this with a guide for making the 3 fixation holes and used the trial 28 patella removing prior bone from around the patellar component using small rongeur.  I then returned to the tibia and using the tripod apparatus and using a #9 baseplate which was stabilized on the tibia with 3 pins, I used the tripod apparatus to ream for the keel for size 9 tibial component.  Because of his extensive deformity, I elected to use a tibial extension of 80 mm and reamed up to a 12 which seem to be a good fit.  I then used a boss reamer to complete the tibial preparation and we used a trial 9 baseplate with 80 mm extension and found that it fit nicely on the tibia.  Accordingly we went ahead  and water picked the knee while the components were opened and the tibial stem attached to the parent tibia.  We then went ahead and glued in the individual components starting first with the tibia, impacting it and removing excess methyl methacrylate and then the femur and impacting it and removing excess methacrylate as well.  I then placed a 12-mm spacer and held the knee in full extension while we glued in the patella using patellar holding clamp.  When the methacrylate hardened, we trimmed up small amounts throughout the knee, irrigated it well and placed a final 12 posterior stabilized spacer.  Knee motion was then checked and was found to be excellent with good stability.  Patella did not require lateral release.  I also along the way injected soft tissues with 0.25% Marcaine with adrenaline and also placed Gelfoam in the distal femoral hole.  The wound was then closed over Hemovac with interrupted #1 Vicryl in 2 layers in the quadriceps tendon and distally, one layer in the synovium, one layer in the capsule.  Subcutaneous tissue was closed with combination #1 and 2-0 Vicryl, staples on the skin.  Betadine, Adaptic, dry sterile dressing were applied.  Tourniquet was released.  He tolerated the procedure well, was taken to recovery room in satisfactory vision with no known complications and no blood loss.          ______________________________ Marlowe Kays, M.D.     JA/MEDQ  D:  09/05/2010  T:  09/05/2010  Job:  [id number]  Electronically Signed by Marlowe Kays M.D. on 09/06/2010 12:59:43 PM

## 2010-09-07 LAB — BASIC METABOLIC PANEL
BUN: 32 mg/dL — ABNORMAL HIGH (ref 6–23)
Calcium: 8.8 mg/dL (ref 8.4–10.5)
Chloride: 96 mEq/L (ref 96–112)
Creatinine, Ser: 1.46 mg/dL — ABNORMAL HIGH (ref 0.50–1.35)
GFR calc Af Amer: 58 mL/min — ABNORMAL LOW (ref >60)

## 2010-09-07 LAB — CBC
MCH: 30.2 pg (ref 26.0–34.0)
MCHC: 33.8 g/dL (ref 30.0–36.0)
RDW: 13.9 % (ref 11.5–15.5)

## 2010-09-07 LAB — PROTIME-INR
INR: 2.58 — ABNORMAL HIGH (ref 0.00–1.49)
Prothrombin Time: 28.1 seconds — ABNORMAL HIGH (ref 11.6–15.2)

## 2010-09-08 LAB — CBC
HCT: 23.2 % — ABNORMAL LOW (ref 39.0–52.0)
MCHC: 34.1 g/dL (ref 30.0–36.0)
MCV: 89.2 fL (ref 78.0–100.0)
Platelets: 164 10*3/uL (ref 150–400)
RDW: 13.8 % (ref 11.5–15.5)
WBC: 12.2 10*3/uL — ABNORMAL HIGH (ref 4.0–10.5)

## 2010-09-08 LAB — PROTIME-INR: INR: 3.16 — ABNORMAL HIGH (ref 0.00–1.49)

## 2010-09-09 LAB — CBC
Hemoglobin: 8.5 g/dL — ABNORMAL LOW (ref 13.0–17.0)
MCH: 30.8 pg (ref 26.0–34.0)
MCHC: 35.4 g/dL (ref 30.0–36.0)
MCV: 87 fL (ref 78.0–100.0)
RBC: 2.76 MIL/uL — ABNORMAL LOW (ref 4.22–5.81)

## 2010-09-09 LAB — BASIC METABOLIC PANEL
CO2: 26 mEq/L (ref 19–32)
Calcium: 9 mg/dL (ref 8.4–10.5)
Creatinine, Ser: 0.96 mg/dL (ref 0.50–1.35)
GFR calc non Af Amer: >60 mL/min (ref >60)
Glucose, Bld: 104 mg/dL — ABNORMAL HIGH (ref 70–99)
Sodium: 132 mEq/L — ABNORMAL LOW (ref 135–145)

## 2010-09-09 LAB — PROTIME-INR: Prothrombin Time: 36.2 seconds — ABNORMAL HIGH (ref 11.6–15.2)

## 2010-09-10 LAB — CBC
MCH: 30.2 pg (ref 26.0–34.0)
MCHC: 34.2 g/dL (ref 30.0–36.0)
MCV: 88.3 fL (ref 78.0–100.0)
Platelets: 245 10*3/uL (ref 150–400)
RDW: 13.5 % (ref 11.5–15.5)

## 2010-09-10 LAB — BASIC METABOLIC PANEL
BUN: 21 mg/dL (ref 6–23)
Calcium: 9 mg/dL (ref 8.4–10.5)
Creatinine, Ser: 0.92 mg/dL (ref 0.50–1.35)
GFR calc Af Amer: >60 mL/min (ref >60)
GFR calc non Af Amer: >60 mL/min (ref >60)
Glucose, Bld: 90 mg/dL (ref 70–99)

## 2010-09-10 LAB — PROTIME-INR: Prothrombin Time: 41.8 seconds — ABNORMAL HIGH (ref 11.6–15.2)

## 2010-09-11 ENCOUNTER — Observation Stay (HOSPITAL_COMMUNITY)
Admission: EM | Admit: 2010-09-11 | Discharge: 2010-09-13 | Disposition: A | Discharge status: Home / Self Care | Payer: Medicare Other | Attending: Orthopedic Surgery | Admitting: Orthopedic Surgery

## 2010-09-11 DIAGNOSIS — IMO0002 Reserved for concepts with insufficient information to code with codable children: Secondary | ICD-10-CM | POA: Insufficient documentation

## 2010-09-11 DIAGNOSIS — Z7901 Long term (current) use of anticoagulants: Secondary | ICD-10-CM | POA: Insufficient documentation

## 2010-09-11 DIAGNOSIS — Z96659 Presence of unspecified artificial knee joint: Secondary | ICD-10-CM | POA: Insufficient documentation

## 2010-09-11 DIAGNOSIS — M25069 Hemarthrosis, unspecified knee: Principal | ICD-10-CM | POA: Insufficient documentation

## 2010-09-11 DIAGNOSIS — X58XXXA Exposure to other specified factors, initial encounter: Secondary | ICD-10-CM | POA: Insufficient documentation

## 2010-09-11 DIAGNOSIS — Z79899 Other long term (current) drug therapy: Secondary | ICD-10-CM | POA: Insufficient documentation

## 2010-09-11 LAB — BASIC METABOLIC PANEL
BUN: 22 mg/dL (ref 6–23)
Calcium: 8.7 mg/dL (ref 8.4–10.5)
Chloride: 99 mEq/L (ref 96–112)
Creatinine, Ser: 0.97 mg/dL (ref 0.50–1.35)
GFR calc Af Amer: >60 mL/min (ref >60)

## 2010-09-11 LAB — CBC
HCT: 26 % — ABNORMAL LOW (ref 39.0–52.0)
MCH: 29.4 pg (ref 26.0–34.0)
MCV: 90 fL (ref 78.0–100.0)
RDW: 13.7 % (ref 11.5–15.5)
WBC: 9.4 10*3/uL (ref 4.0–10.5)

## 2010-09-12 LAB — CBC
HCT: 25.9 % — ABNORMAL LOW (ref 39.0–52.0)
MCHC: 33.2 g/dL (ref 30.0–36.0)
MCV: 89.9 fL (ref 78.0–100.0)
Platelets: 331 10*3/uL (ref 150–400)
RDW: 13.7 % (ref 11.5–15.5)

## 2010-09-12 LAB — PROTIME-INR: INR: 2 — ABNORMAL HIGH (ref 0.00–1.49)

## 2010-09-13 LAB — CBC
HCT: 26.5 % — ABNORMAL LOW (ref 39.0–52.0)
MCHC: 32.8 g/dL (ref 30.0–36.0)
MCV: 90.1 fL (ref 78.0–100.0)
RDW: 13.7 % (ref 11.5–15.5)

## 2010-09-14 NOTE — H&P (Signed)
NAMEJAQUARIOUS, GREY NO.:  [id number]  MEDICAL RECORD NO.:  [id number]  LOCATION:  1601                         FACILITY:  [facility]  PHYSICIAN:  Georges Lynch. Marybell Robards, M.D.DATE OF BIRTH:  [birth year]  DATE OF ADMISSION:  09/11/2010 DATE OF DISCHARGE:                             HISTORY & PHYSICAL   HISTORY OF PRESENT ILLNESS:  Note, this patient is a patient of Dr. Marlowe Kays, my associate, who is on vacation.  I saw him yesterday for the first time on September 11, 2010.  He has a postop total knee.  His discharge at that time was delayed because of a partial withdrawal from his pain medications.  His INR also was markedly elevated over 4.5 and he did have a hemarthrosis in the right total knee.  His INR came down to within the range that we wanted it to be, on Coumadin prior to his discharge.  He did have some mild pressure blistering around the wound which one would see with the pressure within the knee per se but while in the hospital, he remained afebrile on a persistent basis.  His white count was totally normal on a persistent basis.  It was elected at that time for him to go to a skilled nursing facility and he chose [facility].  I dictated a discharge summary, I examined him first, he had certainly no signs of any infection.  He just had classical findings of hemarthrosis within the knee.  For some reason, the nurse at [facility] called me that afternoon, same day of discharge, and said "we are sending him back to the emergency room because his knees are infected and he has pustules on his knee," but that was not true.  He did not have pustules on his knee.  He had some pressure blisters.  He had absolutely no clinical signs of an infection.  He just had this hemarthrosis, the skin was red from the hemarthrosis.  He was afebrile as I mentioned and his white count was normal.  So, despite the fact that I told her to either hold him or send him to the  office, she sent him right back to the emergency room at [facility] and we had to re- admit him.  The first patient was not very happy.  He says when I admitted him that he want to go home, did not want to go back to skilled nursing facility.  PAST HISTORY:  All basically the same.  MEDICATIONS:  He is basically on multiple medications.  These were all dictated on his recent discharge summary.  He was on: 1. Coumadin 5 mg a day. 2. Gabapentin 300 mg a day. 3. Morphine sulfate 60 mg at bedtime. 4. Zantac 75 mg a day. 5. Zocor 40 mg a day.  PAST SURGICAL HISTORY:  His basic surgery on his total knee was done on September 05, 2010.  The remaining part of his studies at this time in his discharge summary. As far as the history and physical, his review of systems were all dictated on his initial history and physical.  PHYSICAL EXAMINATION:  VITAL SIGNS:  His blood pressure was 135/78.  His pulse was 78 and regular, respirations 60 and regular. HEAD, EYES, EARS, NOSE AND THROAT:  Negative. LUNGS:  Clear. HEART:  Normal sinus rhythm with no murmur. ABDOMEN:  Negative. BACK:  Negative. EXTREMITIES:  Upper extremities negative.  Lower extremities, right total knee arthroplasty.  He had no signs of a wound infection whatsoever.  When he was re-admitted, he just had a hemarthrosis of the right knee.  He had blistering about the knee as well.  He did not have any pustules.  Calf is soft, nontender.  No phlebitis.  There were really no other real pertinent positive findings on him.  IMPRESSION: 1. Postoperative right total knee arthroplasty. 2. Hemarthrosis, right knee, with some secondary pressure blisters     that are characteristic with pressure within the joint.  COMMENT:  We will monitor his INR here in the hospital.  I repeated the CBC.  His white count was 10.9, upper limits of normal 10.5 and he was afebrile on his re-admission.  The case was thoroughly discussed with the patient  and basically, he prefers to go back home with the assistance of home health and we will make those arrangements for him. On the re-admission, his INR was now 2, his PT was 23.  His temperature when he was admitted was 98.8, blood pressure is 143/83 and will follow up pressure after admission, heart rate was 65, respirations were 16. His hemoglobin on re-admission was 8.6, hematocrit was 25.9, white count of 10.9, platelets 331,000.  He was re-admitted and he will be discharged tomorrow providing he is afebrile.          ______________________________ Georges Lynch. Darrelyn Hillock, M.D.     RAG/MEDQ  D:  09/12/2010  T:  09/12/2010  Job:  [id number]  cc:   Marlowe Kays, M.D. Fax: [phone]  Electronically Signed by Ranee Gosselin M.D. on 09/14/2010 03:26:17 PM

## 2010-09-14 NOTE — Discharge Summary (Addendum)
NAMEMarland Kitchen  [name], [name] NO.:  [id number]  MEDICAL RECORD NO.:  [id number]  LOCATION:  1601                         FACILITY:  [facility]  PHYSICIAN:  Marlowe Kays, M.D.  DATE OF BIRTH:  21-Aug-1940  DATE OF ADMISSION:  09/05/2010 DATE OF DISCHARGE:                        DISCHARGE SUMMARY - REFERRING   HOSPITAL COURSE:  He was taken to surgery by Dr. Simonne Come on September 05, 2010, and had a right total knee arthroplasty performed.  Postop, he was maintained on Coumadin, heparin protocol.  He had some bleeding from his wound site postop, which was controlled.  He had ice pack to the knee as well.  His Hemovac was discontinued on September 06, 2010.  He was monitored. His monitored by his pharmacist.  Apparently, over the weekend here, he became somewhat confused and it was thought that it would may be withdrawal from the pain medications.  Finally, he cleared up very nicely here on September 10, 2010.  I saw him on September 11, 2010 and he was alert and oriented.  I discussed the case with him this morning, and he explained to me that he basically was going to go to skilled nursing facility because he has just his wife at home to care for him.  Note, his INR was elevated, his Coumadin was held back and his INR finally back on September 11, 2010 at a more reasonable level of 2.73.  The patient has been on chronic pain medicine preop.  The patient's temperature today was that he was afebrile.  His hemoglobin was stable at 8.5, hematocrit 26.  His INR today was 2.73 on July 24.  The patient pertinent laboratory studies on him.  His nasal studies preop were negative for MRSA and staph.  His initial white count on admission was 8.9, his hemoglobin was 12.9, hematocrit 38.9, platelet count 164,000.  Sodium 138, potassium 104, glucose 120, BUN 29, creatinine 1.17.  His INR initially was 1.18, prothrombin time 15.2, the PTT was 38.  The initial chest x-ray showed borderline heart size without  any active cardiopulmonary disease.  The EKG was done, which showed basically no major issues.  FINAL DISCHARGE DIAGNOSIS:  Degenerative arthritis of the right knee with secondary to right total knee arthroplasty.  CONDITION ON DISCHARGE:  Improved.  DISCHARGE DIET:  He will remain on his preop diet.  DISCHARGE MEDICATIONS:  All listed and highlighted on the discharge manager plus highlighted as there were 3 additional prescriptions stable to the discharge manager and those are: 1. Dilaudid 1 mg by mouth q.4h p.r.n. for pain. 2. Coumadin 5 mg a day. 3. Robaxin 500 mg t.i.d. p.r.n. for spasms.  DISCHARGE INSTRUCTIONS: 1. He will be ambulate, full weightbearing with walker. 2. He should have an INR managed twice a week at a skilled nursing     facility and it should be maintained between 2 and 3. 3. He will be Coumadin 5 mg a day. 4. He should have a hemoglobin and hematocrit 2 times per week. 5. He will see Dr. Simonne Come in the office in approximately 1 week     this Wednesday or Thursday which should be July 31 or 30, either  date.  If there are any issues, to call our office at [phone].  He     should also have his wound care, dressing changes daily.  He may     shower but not sit in a tub.    ______________________________ Georges Lynch. Darrelyn Hillock, M.D.   ______________________________ Marlowe Kays, M.D.    RAG/MEDQ  D:  09/11/2010  T:  09/11/2010  Job:  [id number]  Electronically Signed by Ranee Gosselin M.D. on 09/14/2010 03:26:15 PM Electronically Signed by Marlowe Kays M.D. on 09/21/2010 04:19:18 PM

## 2010-10-12 ENCOUNTER — Telehealth: Payer: Self-pay | Admitting: Family Medicine

## 2010-10-12 NOTE — Telephone Encounter (Signed)
Pt req refill of morphine (MS CONTIN) 60 MG 12 hr tablet. Pt req to pick up script by Tues 10/16/10.

## 2010-10-15 ENCOUNTER — Other Ambulatory Visit: Payer: Self-pay | Admitting: *Deleted

## 2010-10-15 DIAGNOSIS — M545 Low back pain: Secondary | ICD-10-CM

## 2010-10-15 MED ORDER — MORPHINE SULFATE CR 60 MG PO TB12: 60.0000 mg | ORAL_TABLET | Freq: Two times a day (BID) | ORAL | Status: DC

## 2010-10-15 NOTE — Telephone Encounter (Signed)
Ready for pick up

## 2010-10-16 NOTE — Discharge Summary (Signed)
  NAMEPHILOPATER, [name] NO.:  [id number]  MEDICAL RECORD NO.:  [id number]  LOCATION:  1601                         FACILITY:  [facility]  PHYSICIAN:  Georges Lynch. Kinsey Cowsert, M.D.DATE OF BIRTH:  March 28, 1940  DATE OF ADMISSION:  09/11/2010 DATE OF DISCHARGE:  09/13/2010                        DISCHARGE SUMMARY - REFERRING   HOSPITAL COURSE:  This is Dr. Leim Fabry the patient.  He was out of town.  He was seen by me and admitted to the hospital on September 11, 2010. He had a right total knee arthroplasty by Dr. Simonne Come on September 05, 2010.  He was sent to skilled nursing facility today and against my wishes.  He was returned right back to the emergency room because they said his knee was infected, which it was not.  When he was seen by [place] in the emergency room, it showed that he just had some erythema and some blisters, which were understandable from the subcutaneous bleeding from his anticoagulant.  His temperature was 99.6 and he remained afebrile during his hospital course as well as whenever he was discharged from the hospital.  The patient's pertinent medications were all listed on the discharge manager.  He was seen again the day he was admitted.  We held his Coumadin.  Initially, he was started on ice pack to his knee and the appropriate studies were done on him.  On September 12, 2010, I made a special note that all his white blood cell counts have been normal, the blisters he had were not pustules as they described at the nursing facility, and I explained to the patient that we would discharge him home.  On July 26, he was doing better.  He still had large hematoma about his knee.  His temperature was 99.  His white count 10,200, hemoglobin 8.7.  His INR was 2.0.  I elected to discharge him home at that time and to be followed in the office.  The pertinent laboratory findings in the hospital, white count 10,900, his hemoglobin was 8.6, hematocrit 25.9.  The PT was 11.6, the  INR 1.49.  DISCHARGE MEDICATIONS:  Discharge manager as I mentioned.  DISCHARGE CONDITION:  Improved.  DISCHARGE DIET:  He will remain on his preop diet.  DISCHARGE INSTRUCTIONS:  Ambulate full weightbearing with a walker and see Dr. Simonne Come in the office.          ______________________________ Georges Lynch. Darrelyn Hillock, M.D.     RAG/MEDQ  D:  10/13/2010  T:  10/13/2010  Job:  [id number]  Electronically Signed by Ranee Gosselin M.D. on 10/16/2010 08:47:12 AM

## 2010-10-18 ENCOUNTER — Ambulatory Visit (HOSPITAL_BASED_OUTPATIENT_CLINIC_OR_DEPARTMENT_OTHER)
Admission: RE | Admit: 2010-10-18 | Discharge: 2010-10-19 | Disposition: A | Discharge status: Home / Self Care | Payer: Medicare Other | Source: Ambulatory Visit | Attending: Orthopedic Surgery | Admitting: Orthopedic Surgery

## 2010-10-18 DIAGNOSIS — Z96659 Presence of unspecified artificial knee joint: Secondary | ICD-10-CM | POA: Insufficient documentation

## 2010-10-18 DIAGNOSIS — Z7901 Long term (current) use of anticoagulants: Secondary | ICD-10-CM | POA: Insufficient documentation

## 2010-10-18 DIAGNOSIS — L988 Other specified disorders of the skin and subcutaneous tissue: Secondary | ICD-10-CM | POA: Insufficient documentation

## 2010-10-18 DIAGNOSIS — Z01812 Encounter for preprocedural laboratory examination: Secondary | ICD-10-CM | POA: Insufficient documentation

## 2010-10-18 DIAGNOSIS — Y831 Surgical operation with implant of artificial internal device as the cause of abnormal reaction of the patient, or of later complication, without mention of misadventure at the time of the procedure: Secondary | ICD-10-CM | POA: Insufficient documentation

## 2010-10-18 DIAGNOSIS — M7989 Other specified soft tissue disorders: Secondary | ICD-10-CM | POA: Insufficient documentation

## 2010-10-18 DIAGNOSIS — T8131XA Disruption of external operation (surgical) wound, not elsewhere classified, initial encounter: Secondary | ICD-10-CM | POA: Insufficient documentation

## 2010-10-18 LAB — POCT I-STAT 4, (NA,K, GLUC, HGB,HCT)
HCT: 32 % — ABNORMAL LOW (ref 39.0–52.0)
Hemoglobin: 10.9 g/dL — ABNORMAL LOW (ref 13.0–17.0)
Sodium: 141 mEq/L (ref 135–145)

## 2010-10-20 LAB — WOUND CULTURE

## 2010-10-23 ENCOUNTER — Encounter (HOSPITAL_BASED_OUTPATIENT_CLINIC_OR_DEPARTMENT_OTHER): Payer: Medicare Other | Attending: General Surgery

## 2010-10-23 DIAGNOSIS — Y831 Surgical operation with implant of artificial internal device as the cause of abnormal reaction of the patient, or of later complication, without mention of misadventure at the time of the procedure: Secondary | ICD-10-CM | POA: Insufficient documentation

## 2010-10-23 DIAGNOSIS — M329 Systemic lupus erythematosus, unspecified: Secondary | ICD-10-CM | POA: Insufficient documentation

## 2010-10-23 DIAGNOSIS — Z79899 Other long term (current) drug therapy: Secondary | ICD-10-CM | POA: Insufficient documentation

## 2010-10-23 DIAGNOSIS — Z7901 Long term (current) use of anticoagulants: Secondary | ICD-10-CM | POA: Insufficient documentation

## 2010-10-23 DIAGNOSIS — I252 Old myocardial infarction: Secondary | ICD-10-CM | POA: Insufficient documentation

## 2010-10-23 DIAGNOSIS — Z96659 Presence of unspecified artificial knee joint: Secondary | ICD-10-CM | POA: Insufficient documentation

## 2010-10-23 DIAGNOSIS — G609 Hereditary and idiopathic neuropathy, unspecified: Secondary | ICD-10-CM | POA: Insufficient documentation

## 2010-10-23 DIAGNOSIS — T8140XA Infection following a procedure, unspecified, initial encounter: Secondary | ICD-10-CM | POA: Insufficient documentation

## 2010-10-24 NOTE — Op Note (Signed)
  [name], [name] NO.:  [id number]  MEDICAL RECORD NO.:  [id number]  LOCATION:                                 FACILITY:  PHYSICIAN:  Marlowe Kays, M.D.  DATE OF BIRTH:  [birth year]  DATE OF PROCEDURE:  10/18/2010 DATE OF DISCHARGE:                              OPERATIVE REPORT   PREOPERATIVE DIAGNOSES:  Wound dehiscence, status post total knee replacement on the right.  POSTOPERATIVE DIAGNOSES:  Wound dehiscence, status post total knee replacement on the right.  OPERATION:  Extensive debridement, irrigation, and partial delayed closure of prepatellar wound, right knee.  SURGEON:  Marlowe Kays, MD  ASSISTANT:  Nurse.  ANESTHESIA:  General.  PATHOLOGY AND JUSTIFICATION FOR PROCEDURE:  He has had a successful total knee replaced on the left x2 with no wound problems.  On the right, he had surgery on September 05, 2010, for a primary knee replacement. His knee itself has done well.  He is on chronic Coumadin and had a hematoma formation, leading to wound dehiscence.  I had been waiting for the swelling to get down enough that I felt we could hopefully undermine the flaps and close the wound.  He has had necrotic tissue around the edges and because he has been on Coumadin, I really had been unable to debride the wound.  Accordingly, he is here today for ideally debridement, irrigation, and complete closure.  PROCEDURE IN DETAIL:  Satisfied general anesthesia, pneumatic tourniquet applied, and inflated to 325 mmHg.  After elevating the leg for about 5 minutes, the right leg was prepped with DuraPrep from tourniquet to ankle and draped in sterile field.  I initially took cultures and then began extensive debridement around the perimeter removing dead tissue. I also debrided up the prepatellar area with small rongeur.  Lastly, I undermined medial and lateral tissue flaps.  The original wound was roughly 11 cm in length and 5 cm in width.  I used  multiple stay sutures of #1 and #0 Ethibond, but was unable to fully close the wound.  I let the tourniquet down at the end and the flaps did not appear to be ischemic.  The final wound I was not able to close was about 5 cm in length and 2 cm in width.  The wound was also irrigated well with antibiotic solution during the case. Betadine, Adaptic, dry sterile dressing, and a knee immobilizer applied. He tolerated the procedure well and at the time of this dictation was on his way to recovery room in satisfactory condition with no known complication.  He was covered with prophylactic antibiotics.          ______________________________ Marlowe Kays, M.D.     JA/MEDQ  D:  10/18/2010  T:  10/19/2010  Job:  [id number]  Electronically Signed by Marlowe Kays M.D. on 10/24/2010 11:46:02 AM

## 2010-10-30 ENCOUNTER — Inpatient Hospital Stay (HOSPITAL_COMMUNITY)
Admission: EM | Admit: 2010-10-30 | Discharge: 2010-11-02 | DRG: 863 | Disposition: A | Discharge status: Home Health | Payer: Medicare Other | Attending: Orthopedic Surgery | Admitting: Orthopedic Surgery

## 2010-10-30 DIAGNOSIS — L02419 Cutaneous abscess of limb, unspecified: Secondary | ICD-10-CM

## 2010-10-30 DIAGNOSIS — I10 Essential (primary) hypertension: Secondary | ICD-10-CM | POA: Diagnosis present

## 2010-10-30 DIAGNOSIS — I252 Old myocardial infarction: Secondary | ICD-10-CM

## 2010-10-30 DIAGNOSIS — E7800 Pure hypercholesterolemia, unspecified: Secondary | ICD-10-CM | POA: Diagnosis present

## 2010-10-30 DIAGNOSIS — T8140XA Infection following a procedure, unspecified, initial encounter: Principal | ICD-10-CM | POA: Diagnosis present

## 2010-10-30 DIAGNOSIS — I2510 Atherosclerotic heart disease of native coronary artery without angina pectoris: Secondary | ICD-10-CM | POA: Diagnosis present

## 2010-10-30 DIAGNOSIS — L03119 Cellulitis of unspecified part of limb: Secondary | ICD-10-CM

## 2010-10-30 DIAGNOSIS — Z96659 Presence of unspecified artificial knee joint: Secondary | ICD-10-CM

## 2010-10-30 DIAGNOSIS — Y831 Surgical operation with implant of artificial internal device as the cause of abnormal reaction of the patient, or of later complication, without mention of misadventure at the time of the procedure: Secondary | ICD-10-CM | POA: Diagnosis present

## 2010-10-30 DIAGNOSIS — H3530 Unspecified macular degeneration: Secondary | ICD-10-CM | POA: Diagnosis present

## 2010-10-30 DIAGNOSIS — T8450XA Infection and inflammatory reaction due to unspecified internal joint prosthesis, initial encounter: Secondary | ICD-10-CM

## 2010-10-30 LAB — CBC
MCH: 29.2 pg (ref 26.0–34.0)
MCHC: 32.5 g/dL (ref 30.0–36.0)
MCV: 90 fL (ref 78.0–100.0)
Platelets: 322 10*3/uL (ref 150–400)
RBC: 3.59 MIL/uL — ABNORMAL LOW (ref 4.22–5.81)
RDW: 14.8 % (ref 11.5–15.5)

## 2010-10-30 LAB — DIFFERENTIAL
Basophils Relative: 0 % (ref 0–1)
Eosinophils Absolute: 0.1 10*3/uL (ref 0.0–0.7)
Eosinophils Relative: 1 % (ref 0–5)
Lymphs Abs: 1.4 10*3/uL (ref 0.7–4.0)
Monocytes Relative: 7 % (ref 3–12)
Neutrophils Relative %: 77 % (ref 43–77)

## 2010-10-30 LAB — BASIC METABOLIC PANEL
BUN: 16 mg/dL (ref 6–23)
CO2: 25 mEq/L (ref 19–32)
Calcium: 9.4 mg/dL (ref 8.4–10.5)
Chloride: 99 mEq/L (ref 96–112)
Creatinine, Ser: 0.83 mg/dL (ref 0.50–1.35)

## 2010-10-31 LAB — DIFFERENTIAL
Basophils Relative: 0 % (ref 0–1)
Eosinophils Absolute: 0.2 10*3/uL (ref 0.0–0.7)
Eosinophils Relative: 2 % (ref 0–5)
Lymphs Abs: 1.9 10*3/uL (ref 0.7–4.0)
Monocytes Absolute: 0.6 10*3/uL (ref 0.1–1.0)
Monocytes Relative: 7 % (ref 3–12)
Neutrophils Relative %: 70 % (ref 43–77)

## 2010-10-31 LAB — URINALYSIS, ROUTINE W REFLEX MICROSCOPIC
Bilirubin Urine: NEGATIVE
Hgb urine dipstick: NEGATIVE
Nitrite: NEGATIVE
Specific Gravity, Urine: 1.013 (ref 1.005–1.030)
Urobilinogen, UA: 1 mg/dL (ref 0.0–1.0)
pH: 7 (ref 5.0–8.0)

## 2010-10-31 LAB — COMPREHENSIVE METABOLIC PANEL
ALT: 9 U/L (ref 0–53)
AST: 13 U/L (ref 0–37)
Albumin: 2.2 g/dL — ABNORMAL LOW (ref 3.5–5.2)
Calcium: 8.6 mg/dL (ref 8.4–10.5)
Creatinine, Ser: 0.88 mg/dL (ref 0.50–1.35)
GFR calc non Af Amer: >60 mL/min (ref >60)
Sodium: 138 mEq/L (ref 135–145)
Total Protein: 6.7 g/dL (ref 6.0–8.3)

## 2010-10-31 LAB — CBC
MCH: 28.9 pg (ref 26.0–34.0)
MCHC: 32.1 g/dL (ref 30.0–36.0)
MCV: 90.2 fL (ref 78.0–100.0)
Platelets: 305 10*3/uL (ref 150–400)
RBC: 3.25 MIL/uL — ABNORMAL LOW (ref 4.22–5.81)
RDW: 14.6 % (ref 11.5–15.5)

## 2010-11-01 DIAGNOSIS — L03119 Cellulitis of unspecified part of limb: Secondary | ICD-10-CM

## 2010-11-01 DIAGNOSIS — L02419 Cutaneous abscess of limb, unspecified: Secondary | ICD-10-CM

## 2010-11-01 DIAGNOSIS — T8140XA Infection following a procedure, unspecified, initial encounter: Secondary | ICD-10-CM

## 2010-11-01 DIAGNOSIS — Y831 Surgical operation with implant of artificial internal device as the cause of abnormal reaction of the patient, or of later complication, without mention of misadventure at the time of the procedure: Secondary | ICD-10-CM

## 2010-11-02 ENCOUNTER — Inpatient Hospital Stay (HOSPITAL_COMMUNITY): Payer: Medicare Other

## 2010-11-06 NOTE — Discharge Summary (Addendum)
NAMEJERMICHAEL, [name] NO.:  [id number]  MEDICAL RECORD NO.:  [id number]  LOCATION:  1534                         FACILITY:  [facility]  PHYSICIAN:  Marlowe Kays, M.D.  DATE OF BIRTH:  1940/08/13  DATE OF ADMISSION:  10/30/2010 DATE OF DISCHARGE:  11/02/2010                              DISCHARGE SUMMARY   ADMITTING DIAGNOSES: 1. Cellulitis of the  total knee replacement arthroplasty of the     right knee. 2. Hypertension. 3. Coronary artery disease. 4. Hypercholesterolemia.  DISCHARGE DIAGNOSES: 1. Cellulitis of the  total knee replacement arthroplasty of the     right knee. 2. Hypertension. 3. Coronary artery disease. 4. Hypercholesterolemia.  OPERATIONS:  None.  CONSULTS:  Cliffton Asters, M.D. of [facility].  BRIEF HISTORY:  This 70 year old male, who was post total knee replacement arthroplasty on the right, had the usual Coumadin protocol postoperatively.  About a week or so after surgery, he had swelling and drainage about the wounds as well as blistering.  The staples removed slowly over the next week, but unfortunately the wound had a tendency to dehisce.  We did not feel then nor now that it is into the joint itself, merely in the superficial tissues.  He was admitted once postoperatively by Dr. Tinnie Gens for draining wound, on September 11, 2010.  He also received IV antibiotics on that admission.  Debridement and secondary closure was performed by Dr. Simonne Come, but unfortunately the patient continued with draining wound.  We sent the patient to Wound Center.  Dr. Joanne Gavel noted it.  The patient had a considerable amount of cellulitis, and we admitted him to the hospital.  COURSE IN THE HOSPITAL:  The patient was placed at bedrest and ambulated ad lib.  Continuous with drainage to the knee.  We asked Dr. Cliffton Asters of Infectious Disease to see the patient as the patient had wound cultures growing Enterobacter, change his  antibiotics to Rocephin. We continued with that.  Dr. Orvan Falconer at discharge, feels the patient can continue with Rocephin 3 weeks q.24 h. IV via PICC line.  Once PICC line is inserted, the patient will be discharged home.  The prescriptions are on the chart.  The patient remained essentially afebrile as well as no elevated white count during the hospitalization.  He had a moderate amount of pain and discomfort.  At the time of discharge, we are covering with Vicodin.  He is on chronic Morphine, primarily for his back pain.  On October 30, 2010, white count was 9.2.  He had mild anemia with hemoglobin of 10.5.  The patient will be discharged home to continue his Rocephin IV.  Recommend he go to the Wound Center, make an appointment for continuation of treatment of his knee wound.  Follow with Dr. Orvan Falconer per his instructions and return to our office about 2 weeks after date of surgery.     Nirvan Laban L. Cherlynn June.   ______________________________ Marlowe Kays, M.D.    DLU/MEDQ  D:  11/02/2010  T:  11/02/2010  Job:  [id number]  cc:   Stacie Glaze, MD [address] [place] [address]  Marlowe Kays, M.D. Fax: [phone]  Joanne Gavel, M.D. Fax: [phone]  Electronically Signed by Marlowe Kays M.D. on 11/06/2010 07:21:25 AM Electronically Signed by Alvera Novel  on 11/26/2010 03:00:48 PM

## 2010-11-06 NOTE — H&P (Addendum)
NAME:  [name], [name] NO.:  [id number]  MEDICAL RECORD NO.:  [id number]  LOCATION:  1534                         FACILITY:  [facility]  PHYSICIAN:  Marlowe Kays, M.D.  DATE OF BIRTH:  1940-10-10  DATE OF ADMISSION:  10/30/2010 DATE OF DISCHARGE:                             HISTORY & PHYSICAL   CHIEF COMPLAINT:  "Problems with my right knee."  HISTORY OF PRESENT ILLNESS:  This 70 year old white male, who underwent total knee replacement arthroplasty on the right in the not too distant past.  He was on Coumadin postoperatively for the Coumadin protocol to prevent DVT.  He did well postoperatively for a week or so, and have an only moderate amount of pain and discomfort.  He noted that he had swelling and drainage about the wound.  We were careful not to remove all the staples at once, but did it slowly over this next couple of days and weeks.  It was noted that when they were removed, the wound dehisced.  At no time did we have any observation where there was any exposure of the prosthesis.  (His original surgery was on September 05, 2010).  He had one admission for antibiotic therapy by Dr. Darrelyn Hillock on July 24.  Dr. Simonne Come took him for debridement and secondary closure to the wound.  Unfortunately, some of the sutures of the secondary closure had split and the wound was again opened.  He was [facility] by Dr. Simonne [facility] for [facility] and treatment.  Dr. Joanne Gavel saw the patient today and felt that he had a considerable amount of cellulitis about the wound, that would be best to give him IV antibiotics and he is eventually brought to the emergency room and then to the transitional unit, waiting for admission.  I have notified the Infectious Disease, he has been put on Zosyn by Dr. Wiliam Ke, awaiting Infectious Disease consultation.  PAST MEDICAL HISTORY:  The patient had been in relatively good health throughout his lifetime.  He does have chronic  low back pain for which he takes gabapentin and morphine at bedtime.  He has hypertension, bronchitis, and MI back in 1998.  PAST SURGICAL HISTORY:  Right shoulder surgery was done in May 2010, and left knee replacement.  He also has macular degeneration.  ALLERGIES:  He has no medical allergies nor to food, latex, or metal.  Dr. Darryll Capers is his family physician, Dr. Clarene Duke at [facility] is his family physician, cardiologist.  CURRENT MEDICATIONS: 1. Bisoprolol 5 mg one-half tablet daily. 2. Zantac 150 mg daily. 3. Gabapentin 300 mg t.i.d. 4. Simvastatin 40 mg at bedtime. 5. Aspirin 325 mg daily.  SOCIAL HISTORY:  The patient has no intake of alcohol or tobacco products.  FAMILY HISTORY:  Noncontributory.  REVIEW OF SYSTEMS:  CNS:  No seizure disorder, paralysis, numbness, or double vision, but the patient does have chronic low back pain. CARDIOVASCULAR:  No chest pain, no angina, no orthopnea.  RESPIRATORY: No productive cough, no hemoptysis, no shortness of breath. GASTROINTESTINAL:  No nausea, vomiting, melena, or bloody stool. GENITOURINARY:  No discharge, dysuria, or hematuria.  MUSCULOSKELETAL: Primarily in present illness with right knee.  PHYSICAL EXAMINATION:  GENERAL:  Alert, cooperative, and friendly 70- year-old white male, seen lying on the stretcher. VITAL SIGNS:  Blood pressure 142/78, pulse 70, respirations 12.  He is 98.1. HEENT:  Normocephalic.  PERRLA, EOM intact.  Oropharynx is clear. CHEST:  Clear to auscultation.  No rhonchi.  No rales. HEART:  Regular rate and rhythm.  No murmurs are heard. ABDOMEN:  Soft and nontender.  Spleen not felt. GENITALIA:  Rectal not done, not pertinent to present illness. EXTREMITIES:  The patient has a mucoid purulent appearing anterior right knee wound with mild erythema about it.  It is also firm to palpate.  ADMISSION DIAGNOSES: 1. Cellulitis over total knee replacement arthroplasty of the right     knee. 2.  Hypertension. 3. Coronary artery disease. 4. Hypercholesterolemia.  PLAN:  We will ask Infectious Disease to see the patient, so that we can glean their expertise as far as handling this wound.  Meanwhile, we will keep him on Zosyn until they see him.     [name].   ______________________________ Marlowe Kays, M.D.    DLU/MEDQ  D:  10/30/2010  T:  10/31/2010  Job:  [id number]  cc:   Stacie Glaze, MD [address] [place] [address]  Marlowe Kays, M.D. Fax: [phone]  Electronically Signed by Marlowe Kays M.D. on 11/06/2010 07:20:34 AM Electronically Signed by Alvera Novel  on 11/26/2010 03:00:59 PM

## 2010-11-19 LAB — BASIC METABOLIC PANEL
CO2: 26
Calcium: 9.2
GFR calc Af Amer: >60
GFR calc non Af Amer: >60
Glucose, Bld: 80
Potassium: 3.9
Sodium: 139

## 2010-11-20 ENCOUNTER — Inpatient Hospital Stay (HOSPITAL_COMMUNITY)
Admission: RE | Admit: 2010-11-20 | Discharge: 2010-11-27 | DRG: 464 | Disposition: A | Discharge status: Skilled Nursing Facility | Payer: Medicare Other | Source: Ambulatory Visit | Attending: Orthopedic Surgery | Admitting: Orthopedic Surgery

## 2010-11-20 ENCOUNTER — Ambulatory Visit (HOSPITAL_COMMUNITY)
Admission: RE | Admit: 2010-11-20 | Discharge: 2010-11-20 | Disposition: A | Discharge status: Home / Self Care | Payer: Medicare Other | Source: Ambulatory Visit | Attending: Orthopedic Surgery | Admitting: Orthopedic Surgery

## 2010-11-20 ENCOUNTER — Encounter (HOSPITAL_BASED_OUTPATIENT_CLINIC_OR_DEPARTMENT_OTHER): Payer: Medicare Other

## 2010-11-20 DIAGNOSIS — M545 Low back pain, unspecified: Secondary | ICD-10-CM | POA: Diagnosis present

## 2010-11-20 DIAGNOSIS — E7800 Pure hypercholesterolemia, unspecified: Secondary | ICD-10-CM | POA: Diagnosis present

## 2010-11-20 DIAGNOSIS — T8450XA Infection and inflammatory reaction due to unspecified internal joint prosthesis, initial encounter: Principal | ICD-10-CM | POA: Diagnosis present

## 2010-11-20 DIAGNOSIS — Z9861 Coronary angioplasty status: Secondary | ICD-10-CM

## 2010-11-20 DIAGNOSIS — Z7982 Long term (current) use of aspirin: Secondary | ICD-10-CM

## 2010-11-20 DIAGNOSIS — A4901 Methicillin susceptible Staphylococcus aureus infection, unspecified site: Secondary | ICD-10-CM | POA: Diagnosis present

## 2010-11-20 DIAGNOSIS — Z79899 Other long term (current) drug therapy: Secondary | ICD-10-CM

## 2010-11-20 DIAGNOSIS — H3530 Unspecified macular degeneration: Secondary | ICD-10-CM | POA: Diagnosis present

## 2010-11-20 DIAGNOSIS — Z96659 Presence of unspecified artificial knee joint: Secondary | ICD-10-CM

## 2010-11-20 DIAGNOSIS — I252 Old myocardial infarction: Secondary | ICD-10-CM

## 2010-11-20 DIAGNOSIS — I2510 Atherosclerotic heart disease of native coronary artery without angina pectoris: Secondary | ICD-10-CM | POA: Diagnosis present

## 2010-11-20 DIAGNOSIS — Y831 Surgical operation with implant of artificial internal device as the cause of abnormal reaction of the patient, or of later complication, without mention of misadventure at the time of the procedure: Secondary | ICD-10-CM | POA: Diagnosis present

## 2010-11-20 DIAGNOSIS — D62 Acute posthemorrhagic anemia: Secondary | ICD-10-CM | POA: Diagnosis not present

## 2010-11-20 DIAGNOSIS — G8929 Other chronic pain: Secondary | ICD-10-CM | POA: Diagnosis present

## 2010-11-20 DIAGNOSIS — I10 Essential (primary) hypertension: Secondary | ICD-10-CM | POA: Diagnosis present

## 2010-11-20 LAB — DIFFERENTIAL
Basophils Absolute: 0 10*3/uL (ref 0.0–0.1)
Basophils Relative: 0 % (ref 0–1)
Lymphocytes Relative: 16 % (ref 12–46)
Neutro Abs: 7.7 10*3/uL (ref 1.7–7.7)
Neutrophils Relative %: 77 % (ref 43–77)

## 2010-11-20 LAB — APTT: aPTT: 50 s — ABNORMAL HIGH (ref 24–37)

## 2010-11-20 LAB — URINALYSIS, DIPSTICK ONLY
Bilirubin Urine: NEGATIVE
Glucose, UA: NEGATIVE mg/dL
Hgb urine dipstick: NEGATIVE
Ketones, ur: NEGATIVE mg/dL
Nitrite: NEGATIVE
Protein, ur: NEGATIVE mg/dL
Specific Gravity, Urine: 1.024 (ref 1.005–1.030)
Urobilinogen, UA: 0.2 mg/dL (ref 0.0–1.0)
pH: 6.5 (ref 5.0–8.0)

## 2010-11-20 LAB — CBC
HCT: 29.7 % — ABNORMAL LOW (ref 39.0–52.0)
Hemoglobin: 9.3 g/dL — ABNORMAL LOW (ref 13.0–17.0)
RBC: 3.41 MIL/uL — ABNORMAL LOW (ref 4.22–5.81)

## 2010-11-20 LAB — BASIC METABOLIC PANEL
BUN: 19 mg/dL (ref 6–23)
Calcium: 9.4 mg/dL (ref 8.4–10.5)
Chloride: 99 mEq/L (ref 96–112)
Creatinine, Ser: 0.76 mg/dL (ref 0.50–1.35)
GFR calc Af Amer: >90 mL/min (ref >90)

## 2010-11-20 LAB — PROTIME-INR: INR: 1.23 (ref 0.00–1.49)

## 2010-11-21 DIAGNOSIS — L089 Local infection of the skin and subcutaneous tissue, unspecified: Secondary | ICD-10-CM

## 2010-11-21 LAB — BASIC METABOLIC PANEL
BUN: 16 mg/dL (ref 6–23)
CO2: 28 mEq/L (ref 19–32)
Chloride: 102 mEq/L (ref 96–112)
Creatinine, Ser: 0.77 mg/dL (ref 0.50–1.35)

## 2010-11-21 LAB — CBC
HCT: 24.5 % — ABNORMAL LOW (ref 39.0–52.0)
MCV: 86.3 fL (ref 78.0–100.0)
RDW: 14.8 % (ref 11.5–15.5)
WBC: 11 10*3/uL — ABNORMAL HIGH (ref 4.0–10.5)

## 2010-11-22 LAB — CBC
MCH: 28.3 pg (ref 26.0–34.0)
MCV: 86.9 fL (ref 78.0–100.0)
Platelets: 229 10*3/uL (ref 150–400)
RDW: 15.1 % (ref 11.5–15.5)

## 2010-11-22 LAB — TYPE AND SCREEN
ABO/RH(D): B POS
Antibody Screen: NEGATIVE
Unit division: 0

## 2010-11-22 LAB — BASIC METABOLIC PANEL
Calcium: 8.7 mg/dL (ref 8.4–10.5)
Creatinine, Ser: 0.86 mg/dL (ref 0.50–1.35)
GFR calc Af Amer: >90 mL/min (ref >90)

## 2010-11-22 NOTE — H&P (Signed)
[name], [name] NO.:  [id number]  MEDICAL RECORD NO.:  [id number]  LOCATION:                                 FACILITY:  PHYSICIAN:  [name]. [name], M.D.  DATE OF BIRTH:  Jun 27, 1940  DATE OF ADMISSION:  11/20/2010 DATE OF DISCHARGE:                             HISTORY & PHYSICAL   The patient is to be admitted on Tuesday, November 20, 2010.  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  HISTORY OF PRESENT ILLNESS:  This is a 70 year old gentleman with a history of total knee arthroplasty on his right knee in July of this year with subsequent wound breakdown, with subsequent I and D, and has been under wound management since then with dressing changes, IV antibiotics through PICC line in the form of Rocephin.  He has had recurrent drainage and nonhealing wound.  He has not had fevers or constitutional symptoms.  He came in today in Dr. Leim Fabry absence, who is the surgeon of record with drainage in his knee.  Today his wound has not healed.  There is diffuse granulation tissue.  He does have a draining wound at the joint line with expressible purulent looking material.  He is afebrile.  Due to his presentation, I had Dr. 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  We discussed with the patient the findings and treatments.  We would recommend admission to the hospital on Tuesday with removal of the implant, I and D, antibiotic spacer, and possible wound VAC.  The surgery, risks, benefits, and aftercare were discussed with the patient and his wife.  The surgery to go ahead as scheduled.  PAST MEDICAL HISTORY:  Drug allergies, none.  CURRENT MEDICATIONS: 1. Bisoprolol 1/2 tablet daily. 2. Simvastatin 40 mg q.h.s. 3. Aspirin 325 mg daily. 4. Rocephin. 5. MS Contin.  PAST SURGICAL HISTORY: 1. Total knee arthroplasty on the left. 2. Total knee  arthroplasty on the right. 3. Multiple back surgeries.  SERIOUS MEDICAL ILLNESSES: 1. History of coronary artery disease with MI and stent.  No problems     currently. 2. History of chronic pain.  FAMILY HISTORY:  Positive for coronary artery disease and CVA.  SOCIAL HISTORY:  The patient is married.  He smokes cigars and does not drink.  REVIEW OF SYSTEMS:  CENTRAL NERVOUS SYSTEM:  Positive for occasional headache.  PULMONARY:  Negative for shortness of breath, PND, or orthopnea.  CARDIOVASCULAR:  Positive for history of MI with stent.  No angina or chest pains currently.  GI:  Negative for ulcers, hepatitis. GU:  Negative for urinary tract difficulty.  MUSCULOSKELETAL:  Positive in HPI.  PHYSICAL EXAMINATION:  VITAL SIGNS:  BP 148/82, respirations 16, pulse 80 and regular. GENERAL APPEARANCE:  This is a well-nourished gentleman in no acute distress. HEENT:  Head, normocephalic.  Nose, patent.  Ears, patent.  Pupils equal, round to light.  Throat, without injection. NECK:  Supple without adenopathy.  Carotids 2+ without bruit. CHEST:  Clear to auscultation.  No rales or rhonchi.  Respirations 16. HEART:  Regular rate and rhythm at 80 beats without murmur. ABDOMEN:  Soft with active bowel sounds.  No masses or organomegaly. NEUROLOGIC:  The patient is alert and oriented to time, place, and person.  Cranial nerves II through XII are grossly intact. EXTREMITIES:  Shows the right knee with open granulating wound status post total knee arthroplasty with retention sutures.  It is currently draining.  There is mild redness.  No warmth.  Sensation and circulation are intact.  Calf is negative.  RADIOGRAPHIC DATA:  X-rays of the total knee are repeated today and show the prosthesis in place in the bone with soft tissue swelling.  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  PLAN:  Removal of implant, I and D, antibiotic spacer, and possible VAC right  knee.     Seth Bitter. [name], P.A.   ______________________________ [name], M.D.    SJC/MEDQ  D:  11/15/2010  T:  11/16/2010  Job:  [id number]  Electronically Signed by [name] P.A. on 11/20/2010 09:04:17 AM Electronically Signed by [name] M.D. on 11/22/2010 08:16:53 AM

## 2010-11-23 DIAGNOSIS — L089 Local infection of the skin and subcutaneous tissue, unspecified: Secondary | ICD-10-CM

## 2010-11-23 LAB — WOUND CULTURE: Culture: NO GROWTH

## 2010-11-23 NOTE — Consult Note (Signed)
NAMEMarland Kitchen  KODIAK, [name] NO.:  [id number]  MEDICAL RECORD NO.:  [id number]  LOCATION:  1505                         FACILITY:  [facility]  PHYSICIAN:  Judyann Munson, MD     DATE OF BIRTH:  [birth year]  DATE OF CONSULTATION:  11/21/2010 DATE OF DISCHARGE:                                CONSULTATION   REQUESTING PHYSICIAN:  Madlyn Frankel. Charlann Boxer, MD  REASON FOR CONSULTATION:  Antibiotic recommendations for late prosthetic knee infection.  HISTORY OF PRESENT ILLNESS:  [name] is a pleasant 70 year old gentleman who has had numerous orthopedic surgeries, including a right total knee arthroplasty in July 2012, that was complicated by hematoma when given Coumadin for DVT prophylaxis.  He has had ongoing difficulties with wound healing since this surgery.  In October 18, 2010, he had wound dehiscence and underwent irrigation and debridement.  At that time, he was also on doxycycline, thought to prevent an early prosthetic joint infection. Seth cultures from his debridement on Gram stain showed  gram-negative rods as well as GPCs in pairs, but no growth on culture.  He was followed Dr. Charlann Boxer and it was noted in his clinic visit on September 12th that he still had increasing erythema.  He was admitted and to be evaluated for a possible infection. At that time, an aspirate was done and his wound culture revealed Enterobacter cloacae.  He was seen by Dr. Cliffton Asters in ID consultation and at that time, [name] was to receive ceftriaxone 1 g daily over Seth next 3 weeks and he was discharged home.    [name] was seen at his orthopedist clinic on October 1st and it was noted that he still had ongoing drainage of his nonhealing wound, with expressible purulent drainage along Seth incision sites.  Based on these findings, [name] was admitted to [facility] for a stage I revision to remove his prosthesis and place an antibiotic spacer before a new prosthesis can be placed.   [name] underwent his surgery on October 2nd without any difficulty and specimens were sent for culture. [name] was started on vancomycin and rifampin empirically.  Primary team has asked for Infectious Disease consultation to guide antibiotic recommendations.  During this time, [name] mentions that since his surgery back in July 2012, he felt that his right total knee replacement has been complicated by poor wound healing and wound dehiscence.  He mentions, over Seth last 3 weeks, since being on ceftriaxone, he has had improved mobility.  He states that it still is somewhat warm and swollen, but felt that Seth drainage was minimizing.  He denies any fevers or chills or night sweats.  No change in appetite.  Denies any malaise.  Pain with his knee was at baseline.  MEDICATIONS: 1. Neurontin 300 mg q.h.s. 2. Iron 325 t.i.d. 3. Lisinopril 20 mg daily. 4. Robaxin 500 mg daily. 5. MS Contin 60 mg q.h.s. 6. Oxycodone 10 mg q.4 p.r.n. 7. MiraLax 17 g b.i.d. 8. Simvastatin is 40 mg q.h.s. 9. Colace 100 mg b.i.d. 10.Zebeta 2.5 mg daily. 11.Vancomycin 1 g q.8.  ALLERGIES:  No known medical allergies.  PAST MEDICAL HISTORY: 1. Hypertension.  2. Coronary artery disease, MI in 1998 with bare-metal stent. 3. Hypertension. 4. Bronchitis. 5. Macular degeneration. 6. Chronic low back pain, on opiates. 7. Bilateral carpal tunnel release surgery. 8. Right shoulder repair. 9. Spinal stenosis, status post central foraminal decompression of L2-     L5. 10.Left total knee replacement in November 1994 with revision of Seth     tibial component in 2001. 11.Revision of left total knee replacement in September 2011. 12.Right total knee replacement in July 2012. 13.Status post incision and drainage in August 2012. 14.Macular degeneration.  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 Does all his ADLs and is in good shape, given his age.  No alcohol or illicit drug use.  Previous smoker, quit over 5 years ago.  FAMILY HISTORY:  Significant for coronary artery disease and lung cancer.  REVIEW OF SYSTEMS:  In general, [name] denies fevers, chills, night sweats.  Has good appetite.  No malaise.  No difficulty with blurry vision.  Denies any sore throat or dysphagia.  No shortness of breath or smoker's cough.  No chest pain.  No additional exertion or orthopnea. No nausea, vomiting, diarrhea, or constipation.  Does have low back pain and right knee pain.  No other arthralgias.  Twelve-point review of systems is otherwise negative.  PHYSICAL EXAMINATION:  VITAL SIGNS:  Temperature is 98.7, pulse is 64, blood pressure 130/50, respiration rate 18 to 20 on room air. GENERAL:  This is a pleasant 70 year old gentleman in no acute distress. A and O x3. HEENT:  Normocephalic, atraumatic.  PERRLA, EOMI.  Good dentition.  No oropharyngeal erythema. NECK:  Supple.  No lymphadenopathy.  No bruits. PULMONARY EXAM:  Clear to auscultation bilaterally.  No wheezes, crackles, or rhonchi.  No accessory muscle use. CARDIAC EXAM:  Normal S1, S2.  No gallops, murmurs, or rubs.  PMI is not displaced, 2+ pulses. LYMPH:  No axillary or groin lymphadenopathy. ABDOMEN:  Nontender, nondistended.  Positive bowel sounds.  No hepatosplenomegaly. EXTREMITIES:  +1 edema to Seth right lower extremity.  His leg is wrapped, has one drain from his recent surgery. NEUROLOGICAL EXAM:  Cranial nerves II through XII is grossly intact. Motor is 5/5 in upper extremities and lower extremity motor exam is deferred due to immobilization for recent surgery.  LABORATORY DATA:  White count of 10, 77% neutrophils, hematocrit of 29.7, hemoglobin 9.3, platelets 306.  Basic metabolic panel shows a creatinine of 0.76.  Microbiology DATA:  1) MRSA and MSSA screen is negative. 2) Wound culture on  October 2nd shows abundant wbc's, predominantly polys, no organisms.  No growth on day 1.  Older microbiology data: 1) on October 18, 2010, Enterobacter cloacae, MICS: cefazolin R> 64, ceftriaxone <1, cefepime <1, ceftazidime< 1, Cipro < 0.5, gentamicin < 0.5, imipenem < 1, Bactrim < 20, cefoxitin resistant 64.  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  He is postop day #1 from having his 2-stage procedure, with removal of all his hardware and empirically started on vancomycin and rifampin.  No organisms identified on culture as of yet.  RECOMMENDATIONS: 1. Please adjust vancomycin dosing to 1 g q.12 and we will recheck     vancomycin trough  before 4th dose with goal of 15 to 20. we will     await culture results to see if this can be discontinued since Seth     [name] is not colonized with MRSA. 2. Please discontinue rifampin for now since Seth original prosthesis     has been removed and now has antibiotics, so there is no need for     rifampin. 3. Please initiate ciprofloxacin 750 mg p.o. q.12 for coverage of     Enterobacter that was isolated back in August 2012 and depending on     Seth current cultures, we may be able to go back to ceftriaxone. 4. Length of therapy.  [name] will likely need to be changed for     6 weeks and would recommend to redoing an aspirate while Seth     [name] is off antibiotics for 1 to 2 weeks and then see if Seth     profile is suggestive of infection.  If infection has resolved,     then it is safe to place a new prosthesis.  We will continue to     follow up his cultures.  It has been a pleasure to take part of his care.          ______________________________ Judyann Munson, MD     CS/MEDQ  D:  11/22/2010   T:  11/22/2010  Job:  [id number]  Electronically Signed by Judyann Munson MD on 11/23/2010 10:39:52 PM

## 2010-11-25 LAB — CREATININE, SERUM: Creatinine, Ser: 0.77 mg/dL (ref 0.50–1.35)

## 2010-11-26 ENCOUNTER — Ambulatory Visit: Payer: Medicare Other | Admitting: Internal Medicine

## 2010-11-26 NOTE — Op Note (Signed)
[name], [name] NO.:  [id number]  MEDICAL RECORD NO.:  [id number]  LOCATION:  1505                         FACILITY:  [facility]  PHYSICIAN:  Seth Frankel. Charlann [name], M.D.  DATE OF BIRTH:  September 08, 1940  DATE OF PROCEDURE:  11/20/2010 DATE OF DISCHARGE:                              OPERATIVE REPORT   PREOPERATIVE DIAGNOSIS:  Infected right total knee replacement.  POSTOPERATIVE DIAGNOSIS:  Infected right total knee replacement.  PROCEDURE:  Resection of right total knee with debridement of soft tissue, bone, muscle as well as implantation of an antibiotic spacer.  SURGEON:  Seth Frankel. Charlann [name], M.D.  ASSISTANT:  Seth Gins, PA  Please note that [name], physician assistant, was present for the entirety of case, present for preoperative positioning, perioperative retractor management, general facilitation case as well as wound closure.  ANESTHESIA:  General.  SPECIMENS:  Joint fluid sent to pathology upon entering the joint. BLOOD LOSS:  About 100 mL.  Initially after tourniquet was let down, tourniquet was maintained for approximately 100 minutes.  DRAINS:  One Hemovac.  INDICATION OF PROCEDURE:  [name] is a 70 year old gentleman with a history of right total knee replacement in July 2012.  He has had unfortunately some problems with persistent draining wound and has an I and D.  He had been on IV PICC line.  He was seen and evaluated in the office with persistent pus draining from his knee.  After reviewing these findings with him, it was my recommendation for him to proceed with the resection of the joint, based on difficulty of clearing infection in the setting.  The risks and benefits were discussed and reviewed as well as all the implications of wound management including the use of a VAC, multiple surgical procedures in an effort to try to get him clear prior to proceeding with total knee replacement, once the skin were to heal.  Questions  were encouraged as his consent obtained.  PROCEDURE IN DETAIL:  The patient was brought to operative theater. Once adequate anesthesia, preoperative antibiotics, vancomycin administered, he was positioned supine with the right thigh tourniquet placed.  Right lower extremity was then prepped and draped in sterile fashion.  A time-out was performed, identifying the patient, planned procedure, and extremity.  Sutures were removed from the patient's old incision and granulated tissue of the anterior aspect of the knee were demarcated and incised. Incision was extended proximally and distally to allow for soft tissue immobilization.  At this point, the extensor mechanism noted to be significant granulation, fibrous tissue consistent with this infectious etiology.  Upon entering into the joint as medial arthrotomy, pus was encountered and cultures were obtained.  Initially, the majority of the case was carried out and soft tissue debridement sharply and to the suprapatellar and medial and lateral gutters.  With the knee exposed, I was able to evert the patella and removed the polyethylene insert.  I then used a thin oscillating saw and undermined the interface between the cement and insert, tibial tray, and femoral component.  The two components were removed with minimal bone loss of the distal femur medially.  Tibial component was removed as well as cement.  Further time was spent in debridement of tissues now that I had posterior aspect of the knee exposed as well as removing cement.  Polyethylene patella was then removed as well.  Once I was satisfied with adequate debridements, three batches of cement were mixed with 3 g of vancomycin and 3 g of tobramycin.  A spacer was made for the joint. The knee was then irrigated with 6 L of normal saline solution with pulse lavage.  Once the cement was nearly cured, it was placed into the knee with some cement placed in the distal femur,  proximal tibia, and then the tibiofemoral space.  A small amount was placed in the suprapatellar pouch to provide some protection from excessive scarring anteriorly.  I did keep the light spray of fluid on this area along the cement and care to prevent any bone damage.  At this point, I placed a medium Hemovac drain deep.  The extensor mechanism was reapproximated using #1 PDS with relative ease.  The skin was then closed.  Please note that the proximal very distal aspect of the wound was easily reapproximated as well as the proximal two-thirds. There was a central area where he had draining wound as well as granulation tissue that with time I was able to get closely reapproximate it.  There did appear to be a bit of tension on the skin; however, the skin closure was done with a 2-0 nylon.  The tourniquet was let down.  The patient did have good return of brisk capillary refill throughout the knee, other than a small area of the incision.  I felt at this point that it was acceptable to clean the knee and dress with Xeroform and a bulky sterile wrap.  He was then awoken from anesthesia and brought to recovery room in stable condition.  At this point, he will be admitted to the hospital, placed on vancomycin and rifampin.  He will have an infectious disease consult and also have a wound care nurse to help manage and follow his knee with the potential concern of wound breakdown and the potential need for dressing this with perhaps wet-to-dry versus wound VAC due to the fact this was over the extensor mechanism.  I did not apply wound VAC due to concerns for excessive drying of the tissues.     [name], M.D.     MDO/MEDQ  D:  11/22/2010  T:  11/22/2010  Job:  [id number]  Electronically Signed by Durene Romans M.D. on 11/26/2010 01:31:29 PM

## 2010-11-27 LAB — ANAEROBIC CULTURE

## 2010-11-27 NOTE — Discharge Summary (Signed)
NAMEMOUHAMED, GLASSCO NO.:  [id number]  MEDICAL RECORD NO.:  [id number]  LOCATION:  1505                         FACILITY:  [facility]  PHYSICIAN:  Madlyn Frankel. Charlann Boxer, M.D.  DATE OF BIRTH:  [birth year]  DATE OF ADMISSION:  11/20/2010 DATE OF DISCHARGE:  11/26/2010                        DISCHARGE SUMMARY - REFERRING   ADMITTING DIAGNOSIS:  Infected right total knee arthroplasty.  DISCHARGE DIAGNOSES: 1. Infected right total knee arthroplasty. 2. Postoperative perioperative acute blood loss anemia requiring     transfusion of 2 units packed red blood cells. 3. Hypertension. 4. Hypercholesterolemia. 5. Chronic pain.  ADMITTING HISTORY:  [name] is a 70 year old gentleman with a history of an index right total knee replacement performed in July.  He has had a subsequent wound complications.  He was seen and evaluated in the office the week prior to admission and noted to have persistent purulent drainage from his knee with granulomatous wound and it was my recommendation time at that he had a resection arthroplasty with management of antibiotic spacer and IV antibiotics.  I had an extensive review with him based on the complicating nature of the skin prior to him being admitted and the surgery was scheduled for October 2.  HOSPITAL COURSE:  The patient admitted for same-day surgery on November 20, 2010, for a planned resection arthroplasty with placement of antibiotic cement spacer, primary wound closure, and IV antibiotics.  Please see dictated operative note for full details of the procedure.  Postoperatively, after routine stay in recovery room, he was transferred to [facility] at [facility] for remainder of his hospital stay.  He was sent on [facility] based on no beds available in 6 [place].  No other medical complications.  On postoperative day #1, he was noted to have a hematocrit of 24.5 and on the postoperative period I felt that it was appropriate to go  ahead and transfuse him with 2 units of packed red blood cells for this postoperative acute blood loss anemia.  Infectious Disease was consulted.  He already had a PICC line in placed on the wound.  ID nurse was consulted as well as a consult to the wound nurse for following.  By postoperative day #2, his hematocrit was only up to 25.2, but he remained stable.  No further transfusions were required.  Infectious Disease was on board.  We placed him initially on vancomycin and rifampin.  They switched him to vancomycin and Cipro.  Prior to discharge, however, he was transferred back to Rocephin as there was no new cultures and the previous cultures revealed methicillin sensitive Staph.  He was seen and evaluated at my request by the wound nurse.  No further treatment of the wound was recommended, the reason being that there was a 2 to 3 cm segment of his most significant wound drainage area that I was worried about primary closure.  At the time of his discharge on last evaluation on November 26, 2010, the vast majority 85% of his wound was completely healing nicely.  There was no signs of any drains.  The central area was still a bit tenuous, but yet no evidence of any dehiscence or complications.  I did remove two sutures from this area to try to remove some of the tension.  Hence further instructions about this will be addressed in the discharge plan.  He was seen evaluated by Physical therapy and Occupational therapy.  My recommendations for touchdown, weightbearing, and knee immobilizer to prevent excessive knee flexion mainly from a wound standpoint until follow up.  No cast applied, the knee immobilizer to be used.  He had daily dressings at the time throughout the hospital.  He otherwise remained stable.  There was some question between he and his wife about the discharge plan, but ultimately the decision was planned to transfer to nursing facility.  Social worker consulted and  arrangements were made for discharge.  DISCHARGE INSTRUCTIONS:  The patient is stable at the time of discharge on November 26, 2010.  The discharge plans will include IV antibiotics for 6 weeks, this will be Rocephin 2 g every 24 hours and also Cipro 500 mg p.o. b.i.d. for 4 weeks.  He will have daily dressing changes with Xeroform gauze and an Ace wrap. Knee immobilizer can be off while on the bed and ice to his knee.  He just needs to keep the knee immobilizer on to prevent knee flexion with activity or complications.  FOLLOWUP:  I would like for him to see me back in the office on November 30, 2010, which is Friday for wound check.  We will try to help facilitate that, otherwise he will be seen on a weekly basis until the wound is healed.  He should keep his wound as dry as possible from a wound healing standpoint until further directed.  He will be seen and evaluated by physical therapy at the nursing facility.  He can do plenty of upper extremity exercises hand e can perform straight leg raise on the right side.  No knee flexion on the right and he can be touchdown weightbearing on the right lower extremity with a knee immobilizer on.  Any orthopedic questions can be addressed to [facility] from Dr. Durene Romans at the [phone], and any medical questions to his primary physician.  DISCHARGE MEDICATIONS:  Include; 1. Cipro 500 mg p.o. b.i.d. for 30 days. 2. Ceftriaxone or Rocephin 2 g every 24 hours for 42 days for 6 weeks. 3. Colace 100 mg p.o. b.i.d. as needed for constipation while on pain     medicine. 4. MiraLax 17 g p.o. once twice a day for constipation while on pain     medicine. 5. Lovenox 40 mg daily for 2 weeks. 6. Iron 325 mg 2-3 times a day as tolerable for 2-3 weeks to help with     anemia. 7. Robaxin 500 mg every 6 hours as needed for muscle spasm pain. 8. Morphine 60 mg at bedtime for chronic pain. 9. Oxycodone 10-20 mg every 6 hours needed for  pain while recovering     in addition to taking his Zocor 40 mg at bedtime. 10.Gabapentin at bedtime 300 mg. 11.__________ plus as needed. 12.Bisoprolol 5 mg half tablet q.a.m. 13.Lisinopril 20 mg q.a.m. 14.Aspirin 325 mg. Any questions addressed to Dr. Madlyn Frankel. Charlann Boxer, in a note on [place].     Madlyn Frankel Charlann Boxer, M.D.     MDO/MEDQ  D:  11/26/2010  T:  11/26/2010  Job:  [id number]  Electronically Signed by Durene Romans M.D. on 11/27/2010 04:14:44 PM

## 2010-12-11 NOTE — H&P (Signed)
  NAMEMarland Kitchen  [name], [name] NO.:  [id number]  MEDICAL RECORD NO.:  [id number]  LOCATION:  FOOT                         FACILITY:  [facility]  PHYSICIAN:  Joanne Gavel, M.D.        DATE OF BIRTH:  24-Jan-1941  DATE OF ADMISSION:  10/23/2010 DATE OF DISCHARGE:                             HISTORY & PHYSICAL   CHIEF COMPLAINT:  Unhealed wound, right knee.  HISTORY OF PRESENT ILLNESS:  The patient with a long history of osteoarthritis, underwent total knee replacement in the middle of July 2012.  The wound did not heal well and he underwent an opening and debridement and cleaning of the wound several weeks after.  He now comes with the wound painful and also a slow healing.  PAST MEDICAL HISTORY:  He had a heart attack and a stent several years ago.  He has no history of other heart disease, lung disease, kidney disease.  No history of epilepsy, convulsion creamer, or tremor.  No history of diabetes or thyroid disease.  MEDICATIONS:  Simvastatin, gabapentin, doxycycline, morphine, Zantac, aspirin, and Coumadin which is now on hold.  ALLERGIES:  TYLENOL has caused liver damage.  SOCIAL HISTORY:  Cigarettes none.  Alcohol none.  PAST SURGICAL HISTORY:  Left knee surgery, right knee surgery, back surgery, and shoulder surgery.  PAST MEDICAL HISTORY:  Reveals a history of peripheral neuropathy, skin lupus, and abnormalities mentioned above.  PHYSICAL EXAMINATION:  VITAL SIGNS:  Temperature 97.9, pulse 64, respirations 20, blood pressure 146/80. GENERAL APPEARANCE:  Well-developed, well-nourished, in no distress. CHEST:  Clear. CARDIOVASCULAR:  Heart is in regular rhythm. ABDOMEN:  Not examined. EXTREMITIES:  Reveals good peripheral pulses.  At the knee, there was a long incision with multiple sutures.  Most of the middle portion of the wound is open.  The surrounding area is quite red and tender.  There is a pustule superior to the wound.  This pustule is opened and  probing leads to discovery of a sizable cavity in the superior aspect of the wound with local anesthesia, 1% Xylocaine, the top 2 or 3 inches of the wound is opened up and is irrigated with Hibiclens and saline, after culture was taken and packed with Iodoform gauze.  IMPRESSION:  Skin and subcutaneous infection at the site of the knee replacement.  PLAN OF TREATMENT:  Superior portion of the wound which appears quite distant from the knee joint will be treated with dressing changes and packing changes at present.  We will see the patient on Thursday to make sure that we are progressing in the right direction.  The rest of the wound will have to be healed by secondary intention and we will use Santyl and hydrogel starting there in the skin gap.  I have changed the antibiotics to Keflex, but we will be prepared to change again if cultures require that.     Joanne Gavel, M.D.     RA/MEDQ  D:  10/23/2010  T:  10/23/2010  Job:  [id number]  cc:   Marlowe Kays, M.D.  Electronically Signed by Joanne Gavel M.D. on 12/11/2010 09:14:07 AM

## 2010-12-18 ENCOUNTER — Telehealth: Payer: Self-pay | Admitting: Internal Medicine

## 2010-12-18 NOTE — Telephone Encounter (Deleted)
Pt is aware it will be tomorrow Wednesday before dr Lovell Sheehan reads messages

## 2010-12-18 NOTE — Telephone Encounter (Signed)
Pt  Had knew surgery and is having a hard time with it he is about to go in for another surgery and the recovery time is 8 weeks pt is having a very hard time and is wanting to get on medication for depression. Please contact pt

## 2010-12-19 NOTE — Telephone Encounter (Signed)
Per dr j Rudene Re- if pt is taking lexapro qd as directed, change to cymbalta 60 qd

## 2010-12-19 NOTE — Telephone Encounter (Signed)
LMTCB

## 2010-12-19 NOTE — Telephone Encounter (Signed)
Pt states he doesn't take lexapro on a regular basis.  encouarged to take daily and if not better dr Lovell Sheehan will probabaly will change med.

## 2010-12-20 ENCOUNTER — Other Ambulatory Visit (HOSPITAL_COMMUNITY): Payer: Medicare Other

## 2010-12-24 ENCOUNTER — Encounter (HOSPITAL_COMMUNITY): Payer: Medicare Other

## 2010-12-24 ENCOUNTER — Encounter (HOSPITAL_COMMUNITY): Payer: Self-pay

## 2010-12-24 LAB — DIFFERENTIAL
Basophils Absolute: 0 10*3/uL (ref 0.0–0.1)
Basophils Relative: 0 % (ref 0–1)
Eosinophils Relative: 2 % (ref 0–5)
Monocytes Absolute: 0.7 10*3/uL (ref 0.1–1.0)

## 2010-12-24 LAB — CBC
HCT: 30.1 % — ABNORMAL LOW (ref 39.0–52.0)
MCHC: 31.6 g/dL (ref 30.0–36.0)
MCV: 85.8 fL (ref 78.0–100.0)
RDW: 16.1 % — ABNORMAL HIGH (ref 11.5–15.5)

## 2010-12-24 LAB — BASIC METABOLIC PANEL
BUN: 23 mg/dL (ref 6–23)
CO2: 26 mEq/L (ref 19–32)
Calcium: 10.1 mg/dL (ref 8.4–10.5)
Chloride: 96 mEq/L (ref 96–112)
Creatinine, Ser: 0.93 mg/dL (ref 0.50–1.35)
GFR calc Af Amer: >90 mL/min (ref >90)

## 2010-12-24 LAB — URINALYSIS, ROUTINE W REFLEX MICROSCOPIC
Glucose, UA: NEGATIVE mg/dL
Hgb urine dipstick: NEGATIVE
Specific Gravity, Urine: 1.025 (ref 1.005–1.030)
Urobilinogen, UA: 0.2 mg/dL (ref 0.0–1.0)
pH: 6.5 (ref 5.0–8.0)

## 2010-12-24 LAB — SURGICAL PCR SCREEN
MRSA, PCR: NEGATIVE
Staphylococcus aureus: NEGATIVE

## 2010-12-24 LAB — URINE MICROSCOPIC-ADD ON

## 2010-12-24 MED ORDER — CHLORHEXIDINE GLUCONATE 4 % EX LIQD: 60.0000 mL | Freq: Once | CUTANEOUS | Status: DC

## 2010-12-24 NOTE — Patient Instructions (Signed)
20 ANICETO KYSER  12/24/2010   Your procedure is scheduled on:  12/25/2010  Report to [facility] at 0900 AM.  Call this number if you have problems the morning of surgery: [phone]   Remember:   Do not eat food:After Midnight.  Do not drink clear liquids: After Midnight.  Take these medicines the morning of surgery with A SIP OF WATER: MS Contin   Do not wear jewelry, make-up or nail polish.  Do not wear lotions, powders, or perfumes.   Do not shave 48 hours prior to surgery.  Do not bring valuables to the hospital.  Contacts, dentures or bridgework may not be worn into surgery.  Leave suitcase in the car. After surgery it may be brought to your room.  For patients admitted to the hospital, checkout time is 11:00 AM the day of discharge.   Patients discharged the day of surgery will not be allowed to drive home.  Name and phone number of your driver: Bonita Quin -[phone] cell  Special Instructions: CHG Shower Use Special Wash: 1/2 bottle night before surgery and 1/2 bottle morning of surgery.   Please read over the following fact sheets that you were given: MRSA Information and blood transfusion

## 2010-12-24 NOTE — Pre-Procedure Instructions (Signed)
Right upper arm PICC line- PICC line inserted 08/2010

## 2010-12-24 NOTE — Pre-Procedure Instructions (Addendum)
11/20/2010-Chest x-ray and 10/29/2010- EKG-on chart, 06/21/2008-Cardiac Nuclear stress test -[place] on chart

## 2010-12-25 ENCOUNTER — Encounter (HOSPITAL_COMMUNITY): Payer: Self-pay | Admitting: Anesthesiology

## 2010-12-25 ENCOUNTER — Inpatient Hospital Stay (HOSPITAL_COMMUNITY): Payer: Medicare Other | Admitting: Anesthesiology

## 2010-12-25 ENCOUNTER — Inpatient Hospital Stay (HOSPITAL_COMMUNITY)
Admission: RE | Admit: 2010-12-25 | Discharge: 2010-12-28 | DRG: 497 | Disposition: A | Discharge status: Home Health | Payer: Medicare Other | Source: Ambulatory Visit | Attending: Orthopedic Surgery | Admitting: Orthopedic Surgery

## 2010-12-25 ENCOUNTER — Encounter (HOSPITAL_COMMUNITY): Payer: Self-pay | Admitting: *Deleted

## 2010-12-25 ENCOUNTER — Encounter (HOSPITAL_COMMUNITY)
Admission: RE | Disposition: A | Discharge status: Home Health | Payer: Self-pay | Source: Ambulatory Visit | Attending: Orthopedic Surgery

## 2010-12-25 DIAGNOSIS — Z96659 Presence of unspecified artificial knee joint: Secondary | ICD-10-CM

## 2010-12-25 DIAGNOSIS — B999 Unspecified infectious disease: Secondary | ICD-10-CM | POA: Diagnosis present

## 2010-12-25 DIAGNOSIS — G4733 Obstructive sleep apnea (adult) (pediatric): Secondary | ICD-10-CM | POA: Diagnosis present

## 2010-12-25 DIAGNOSIS — E785 Hyperlipidemia, unspecified: Secondary | ICD-10-CM | POA: Diagnosis present

## 2010-12-25 DIAGNOSIS — N138 Other obstructive and reflux uropathy: Secondary | ICD-10-CM | POA: Diagnosis present

## 2010-12-25 DIAGNOSIS — I10 Essential (primary) hypertension: Secondary | ICD-10-CM | POA: Diagnosis present

## 2010-12-25 DIAGNOSIS — Z4789 Encounter for other orthopedic aftercare: Principal | ICD-10-CM

## 2010-12-25 DIAGNOSIS — N401 Enlarged prostate with lower urinary tract symptoms: Secondary | ICD-10-CM | POA: Diagnosis present

## 2010-12-25 DIAGNOSIS — Z89529 Acquired absence of unspecified knee: Secondary | ICD-10-CM

## 2010-12-25 DIAGNOSIS — Z01812 Encounter for preprocedural laboratory examination: Secondary | ICD-10-CM

## 2010-12-25 DIAGNOSIS — I2510 Atherosclerotic heart disease of native coronary artery without angina pectoris: Secondary | ICD-10-CM | POA: Diagnosis present

## 2010-12-25 DIAGNOSIS — I252 Old myocardial infarction: Secondary | ICD-10-CM

## 2010-12-25 HISTORY — PX: EXCISIONAL TOTAL KNEE ARTHROPLASTY: SHX5015

## 2010-12-25 SURGERY — EXCISIONAL TOTAL KNEE ARTHROPLASTY
Anesthesia: General | Site: Knee | Laterality: Right | Wound class: Clean Contaminated

## 2010-12-25 MED ORDER — ONDANSETRON HCL 4 MG/2ML IJ SOLN: 4.0000 mg | Freq: Four times a day (QID) | INTRAMUSCULAR | Status: DC | PRN

## 2010-12-25 MED ORDER — DIPHENHYDRAMINE HCL 25 MG PO CAPS: 25.0000 mg | ORAL_CAPSULE | Freq: Four times a day (QID) | ORAL | Status: DC | PRN

## 2010-12-25 MED ORDER — FENTANYL CITRATE 0.05 MG/ML IJ SOLN
INTRAMUSCULAR | Status: AC
  Filled 2010-12-25: qty 2

## 2010-12-25 MED ORDER — FERROUS SULFATE 325 (65 FE) MG PO TABS
325.0000 mg | ORAL_TABLET | Freq: Three times a day (TID) | ORAL | Status: DC
  Administered 2010-12-26 – 2010-12-28 (×7): 325 mg via ORAL
  Filled 2010-12-25 (×10): qty 1

## 2010-12-25 MED ORDER — THERA M PLUS PO TABS
1.0000 | ORAL_TABLET | Freq: Every day | ORAL | Status: DC
  Administered 2010-12-26 – 2010-12-27 (×2): 1 via ORAL
  Administered 2010-12-28: 10:00:00 via ORAL
  Filled 2010-12-25 (×4): qty 1

## 2010-12-25 MED ORDER — BISACODYL 5 MG PO TBEC: 10.0000 mg | DELAYED_RELEASE_TABLET | Freq: Every day | ORAL | Status: DC | PRN

## 2010-12-25 MED ORDER — GABAPENTIN 300 MG PO CAPS
300.0000 mg | ORAL_CAPSULE | ORAL | Status: DC | PRN
  Filled 2010-12-25 (×2): qty 1

## 2010-12-25 MED ORDER — SERTRALINE HCL 100 MG PO TABS
100.0000 mg | ORAL_TABLET | Freq: Every day | ORAL | Status: DC
  Administered 2010-12-25 – 2010-12-28 (×4): 100 mg via ORAL
  Filled 2010-12-25 (×4): qty 1

## 2010-12-25 MED ORDER — MORPHINE SULFATE CR 30 MG PO TB12
30.0000 mg | ORAL_TABLET | Freq: Four times a day (QID) | ORAL | Status: DC
  Administered 2010-12-25 – 2010-12-28 (×12): 30 mg via ORAL
  Filled 2010-12-25 (×12): qty 1

## 2010-12-25 MED ORDER — BISOPROLOL FUMARATE 5 MG PO TABS
2.5000 mg | ORAL_TABLET | Freq: Once | ORAL | Status: AC
  Administered 2010-12-25: 2.5 mg via ORAL
  Filled 2010-12-25: qty 0.5

## 2010-12-25 MED ORDER — VANCOMYCIN HCL 5000 MG IV SOLR
6000.0000 mg | INTRAVENOUS | Status: AC
  Filled 2010-12-25: qty 1000

## 2010-12-25 MED ORDER — FENTANYL CITRATE 0.05 MG/ML IJ SOLN
50.0000 ug | Freq: Once | INTRAMUSCULAR | Status: AC
  Administered 2010-12-25: 100 ug via INTRAVENOUS

## 2010-12-25 MED ORDER — POLYETHYLENE GLYCOL 3350 17 G PO PACK
17.0000 g | PACK | Freq: Every day | ORAL | Status: DC
  Filled 2010-12-25 (×4): qty 1

## 2010-12-25 MED ORDER — ONDANSETRON HCL 4 MG PO TABS
4.0000 mg | ORAL_TABLET | Freq: Four times a day (QID) | ORAL | Status: DC | PRN
  Administered 2010-12-28: 4 mg via ORAL
  Filled 2010-12-25: qty 1

## 2010-12-25 MED ORDER — SIMVASTATIN 40 MG PO TABS
40.0000 mg | ORAL_TABLET | Freq: Every day | ORAL | Status: DC
  Administered 2010-12-25 – 2010-12-28 (×2): 40 mg via ORAL
  Filled 2010-12-25 (×4): qty 1

## 2010-12-25 MED ORDER — LACTATED RINGERS IV SOLN
INTRAVENOUS | Status: DC
  Administered 2010-12-25: 1000 mL via INTRAVENOUS

## 2010-12-25 MED ORDER — METHOCARBAMOL 100 MG/ML IJ SOLN
500.0000 mg | Freq: Four times a day (QID) | INTRAMUSCULAR | Status: DC | PRN
  Administered 2010-12-25: 500 mg via INTRAVENOUS
  Filled 2010-12-25: qty 5

## 2010-12-25 MED ORDER — PHENOL 1.4 % MT LIQD: 1.0000 | OROMUCOSAL | Status: DC | PRN

## 2010-12-25 MED ORDER — VANCOMYCIN HCL IN DEXTROSE 1-5 GM/200ML-% IV SOLN
INTRAVENOUS | Status: AC
  Filled 2010-12-25: qty 200

## 2010-12-25 MED ORDER — SODIUM CHLORIDE 0.9 % IR SOLN
Status: DC | PRN
  Administered 2010-12-25: 6000 mL

## 2010-12-25 MED ORDER — DOCUSATE SODIUM 100 MG PO CAPS
100.0000 mg | ORAL_CAPSULE | Freq: Two times a day (BID) | ORAL | Status: DC
  Administered 2010-12-25 – 2010-12-28 (×6): 100 mg via ORAL
  Filled 2010-12-25 (×7): qty 1

## 2010-12-25 MED ORDER — MENTHOL 3 MG MT LOZG: 1.0000 | LOZENGE | OROMUCOSAL | Status: DC | PRN

## 2010-12-25 MED ORDER — HYDROMORPHONE HCL PF 1 MG/ML IJ SOLN
0.2500 mg | INTRAMUSCULAR | Status: DC | PRN
  Administered 2010-12-25 (×4): 0.5 mg via INTRAVENOUS

## 2010-12-25 MED ORDER — FLEET ENEMA 7-19 GM/118ML RE ENEM: 1.0000 | ENEMA | Freq: Every day | RECTAL | Status: DC | PRN

## 2010-12-25 MED ORDER — POTASSIUM CHLORIDE IN NACL 20-0.9 MEQ/L-% IV SOLN
100.0000 mL/h | INTRAVENOUS | Status: DC
  Administered 2010-12-25 – 2010-12-26 (×2): 100 mL/h via INTRAVENOUS
  Filled 2010-12-25 (×7): qty 1000

## 2010-12-25 MED ORDER — RIVAROXABAN 10 MG PO TABS
10.0000 mg | ORAL_TABLET | ORAL | Status: DC
  Administered 2010-12-26 – 2010-12-28 (×3): 10 mg via ORAL
  Filled 2010-12-25 (×3): qty 1

## 2010-12-25 MED ORDER — VANCOMYCIN HCL IN DEXTROSE 1-5 GM/200ML-% IV SOLN: 1000.0000 mg | Freq: Once | INTRAVENOUS | Status: DC

## 2010-12-25 MED ORDER — METHOCARBAMOL 500 MG PO TABS
500.0000 mg | ORAL_TABLET | Freq: Four times a day (QID) | ORAL | Status: DC | PRN
  Administered 2010-12-25 – 2010-12-27 (×5): 500 mg via ORAL
  Filled 2010-12-25 (×3): qty 1

## 2010-12-25 MED ORDER — METOCLOPRAMIDE HCL 10 MG PO TABS: 5.0000 mg | ORAL_TABLET | Freq: Three times a day (TID) | ORAL | Status: DC | PRN

## 2010-12-25 MED ORDER — HYDROMORPHONE HCL PF 1 MG/ML IJ SOLN
INTRAMUSCULAR | Status: AC
  Filled 2010-12-25: qty 1

## 2010-12-25 MED ORDER — TOBRAMYCIN SULFATE 1.2 G IJ SOLR
INTRAMUSCULAR | Status: AC
  Filled 2010-12-25: qty 3.6

## 2010-12-25 MED ORDER — ALUM & MAG HYDROXIDE-SIMETH 200-200-20 MG/5ML PO SUSP: 30.0000 mL | ORAL | Status: DC | PRN

## 2010-12-25 MED ORDER — LACTATED RINGERS IV SOLN
INTRAVENOUS | Status: DC | PRN
  Administered 2010-12-25 (×2): via INTRAVENOUS

## 2010-12-25 MED ORDER — HYDROMORPHONE HCL PF 2 MG/ML IJ SOLN
0.5000 mg | INTRAMUSCULAR | Status: DC | PRN
  Administered 2010-12-25 – 2010-12-27 (×2): 2 mg via INTRAVENOUS
  Filled 2010-12-25 (×2): qty 1

## 2010-12-25 MED ORDER — SENNA 8.6 MG PO TABS
1.0000 | ORAL_TABLET | Freq: Two times a day (BID) | ORAL | Status: DC
  Administered 2010-12-26 – 2010-12-28 (×5): 8.6 mg via ORAL
  Filled 2010-12-25 (×5): qty 1

## 2010-12-25 MED ORDER — CISATRACURIUM BESYLATE 2 MG/ML IV SOLN
INTRAVENOUS | Status: DC | PRN
  Administered 2010-12-25: 4 mg via INTRAVENOUS

## 2010-12-25 MED ORDER — DEXTROSE 5 % IV SOLN
2.0000 g | Freq: Once | INTRAVENOUS | Status: DC
  Filled 2010-12-25: qty 2

## 2010-12-25 MED ORDER — METHOCARBAMOL 500 MG PO TABS
500.0000 mg | ORAL_TABLET | Freq: Two times a day (BID) | ORAL | Status: DC | PRN
  Administered 2010-12-27: 500 mg via ORAL
  Filled 2010-12-25 (×5): qty 1

## 2010-12-25 MED ORDER — VANCOMYCIN HCL 1000 MG IV SOLR
1000.0000 mg | INTRAVENOUS | Status: DC | PRN
  Administered 2010-12-25: 1 g via INTRAVENOUS

## 2010-12-25 MED ORDER — TOBRAMYCIN POWD
6000.0000 mg | Status: AC
  Filled 2010-12-25: qty 6000

## 2010-12-25 MED ORDER — BISOPROLOL FUMARATE 5 MG PO TABS
2.5000 mg | ORAL_TABLET | Freq: Every day | ORAL | Status: DC
  Administered 2010-12-26 – 2010-12-27 (×2): 2.5 mg via ORAL
  Administered 2010-12-28: 10:00:00 via ORAL
  Filled 2010-12-25 (×4): qty 0.5

## 2010-12-25 MED ORDER — ZOLPIDEM TARTRATE 5 MG PO TABS
5.0000 mg | ORAL_TABLET | Freq: Every evening | ORAL | Status: DC | PRN
  Administered 2010-12-25 – 2010-12-27 (×3): 5 mg via ORAL
  Filled 2010-12-25 (×3): qty 1

## 2010-12-25 MED ORDER — PROMETHAZINE HCL 25 MG/ML IJ SOLN: 6.2500 mg | INTRAMUSCULAR | Status: DC | PRN

## 2010-12-25 MED ORDER — SENNOSIDES-DOCUSATE SODIUM 8.6-50 MG PO TABS
1.0000 | ORAL_TABLET | Freq: Every evening | ORAL | Status: DC | PRN
  Filled 2010-12-25: qty 1

## 2010-12-25 MED ORDER — TOBRAMYCIN SULFATE 1.2 G IJ SOLR
INTRAMUSCULAR | Status: DC | PRN
  Administered 2010-12-25: 7.2 g

## 2010-12-25 MED ORDER — ASPIRIN 81 MG PO TABS
81.0000 mg | ORAL_TABLET | Freq: Every day | ORAL | Status: DC
  Administered 2010-12-25 – 2010-12-28 (×3): 81 mg via ORAL
  Filled 2010-12-25 (×4): qty 1

## 2010-12-25 MED ORDER — VANCOMYCIN HCL 1000 MG IV SOLR
INTRAVENOUS | Status: DC | PRN
  Administered 2010-12-25: 6 g

## 2010-12-25 MED ORDER — MIDAZOLAM HCL 5 MG/5ML IJ SOLN
INTRAMUSCULAR | Status: DC | PRN
  Administered 2010-12-25: .5 mg via INTRAVENOUS
  Administered 2010-12-25: 1 mg via INTRAVENOUS

## 2010-12-25 MED ORDER — PROPOFOL 10 MG/ML IV EMUL
INTRAVENOUS | Status: DC | PRN
  Administered 2010-12-25: 50 mg via INTRAVENOUS
  Administered 2010-12-25: 100 mg via INTRAVENOUS

## 2010-12-25 MED ORDER — LIDOCAINE HCL (CARDIAC) 20 MG/ML IV SOLN
INTRAVENOUS | Status: DC | PRN
  Administered 2010-12-25: 80 mg via INTRAVENOUS

## 2010-12-25 MED ORDER — ONDANSETRON HCL 4 MG/2ML IJ SOLN
INTRAMUSCULAR | Status: DC | PRN
  Administered 2010-12-25: 4 mg via INTRAVENOUS

## 2010-12-25 MED ORDER — FENTANYL CITRATE 0.05 MG/ML IJ SOLN
INTRAMUSCULAR | Status: DC | PRN
  Administered 2010-12-25 (×7): 50 ug via INTRAVENOUS

## 2010-12-25 MED ORDER — CEFAZOLIN SODIUM 1-5 GM-% IV SOLN: 1.0000 g | Freq: Once | INTRAVENOUS | Status: DC

## 2010-12-25 MED ORDER — FERROUS GLUCONATE 324 (38 FE) MG PO TABS
325.0000 mg | ORAL_TABLET | Freq: Every day | ORAL | Status: DC
  Administered 2010-12-26 – 2010-12-27 (×2): 325 mg via ORAL
  Administered 2010-12-28: 324 mg via ORAL
  Filled 2010-12-25 (×4): qty 1

## 2010-12-25 MED ORDER — DEXTROSE 5 % IV SOLN
2.0000 g | INTRAVENOUS | Status: DC | PRN
  Administered 2010-12-25: 2 g via INTRAVENOUS

## 2010-12-25 MED ORDER — SODIUM CHLORIDE 0.9 % IR SOLN
Status: DC | PRN
  Administered 2010-12-25: 1000 mL

## 2010-12-25 MED ORDER — BISACODYL 10 MG RE SUPP: 10.0000 mg | Freq: Every day | RECTAL | Status: DC | PRN

## 2010-12-25 MED ORDER — ALUMINUM HYDROXIDE GEL 600 MG/5ML PO SUSP: 15.0000 mL | ORAL | Status: DC | PRN

## 2010-12-25 MED ORDER — HYDROMORPHONE HCL PF 1 MG/ML IJ SOLN
0.5000 mg | INTRAMUSCULAR | Status: DC | PRN
  Administered 2010-12-25: 0.5 mg via INTRAVENOUS
  Administered 2010-12-25 (×2): 1 mg via INTRAVENOUS
  Filled 2010-12-25: qty 1
  Filled 2010-12-25: qty 2
  Filled 2010-12-25: qty 1

## 2010-12-25 MED ORDER — CEFTRIAXONE SODIUM IN DEXTROSE 40 MG/ML IV SOLN
2.0000 g | INTRAVENOUS | Status: DC
  Administered 2010-12-25 – 2010-12-27 (×3): 2 g via INTRAVENOUS
  Filled 2010-12-25 (×5): qty 50

## 2010-12-25 MED ORDER — METOCLOPRAMIDE HCL 5 MG/ML IJ SOLN: 5.0000 mg | Freq: Three times a day (TID) | INTRAMUSCULAR | Status: DC | PRN

## 2010-12-25 SURGICAL SUPPLY — 70 items
BAG SPEC THK2 15X12 ZIP CLS (MISCELLANEOUS) ×2
BAG ZIPLOCK 12X15 (MISCELLANEOUS) ×3 IMPLANT
BANDAGE ELASTIC 4 VELCRO ST LF (GAUZE/BANDAGES/DRESSINGS) ×3 IMPLANT
BANDAGE ELASTIC 6 VELCRO ST LF (GAUZE/BANDAGES/DRESSINGS) ×3 IMPLANT
BANDAGE ESMARK 6X9 LF (GAUZE/BANDAGES/DRESSINGS) ×2 IMPLANT
BNDG CMPR 9X6 STRL LF SNTH (GAUZE/BANDAGES/DRESSINGS) ×2
BNDG COHESIVE 6X5 TAN NS LF (GAUZE/BANDAGES/DRESSINGS) ×4 IMPLANT
BNDG ESMARK 6X9 LF (GAUZE/BANDAGES/DRESSINGS) ×3
CEMENT HV SMART SET (Cement) ×8 IMPLANT
CLOTH BEACON ORANGE TIMEOUT ST (SAFETY) ×3 IMPLANT
CONT SPECI 4OZ STER CLIK (MISCELLANEOUS) ×3 IMPLANT
CUFF TOURN SGL QUICK 34 (TOURNIQUET CUFF) ×3
CUFF TRNQT CYL 34X4X40X1 (TOURNIQUET CUFF) ×2 IMPLANT
DECANTER SPIKE VIAL GLASS SM (MISCELLANEOUS) ×3 IMPLANT
DRAPE EXTREMITY T 121X128X90 (DRAPE) ×3 IMPLANT
DRAPE POUCH INSTRU U-SHP 10X18 (DRAPES) ×3 IMPLANT
DRAPE U-SHAPE 47X51 STRL (DRAPES) ×3 IMPLANT
DRSG ADAPTIC 3X8 NADH LF (GAUZE/BANDAGES/DRESSINGS) ×3 IMPLANT
DRSG PAD ABDOMINAL 8X10 ST (GAUZE/BANDAGES/DRESSINGS) ×6 IMPLANT
DURAPREP 26ML APPLICATOR (WOUND CARE) ×3 IMPLANT
ELECT REM PT RETURN 9FT ADLT (ELECTROSURGICAL) ×3
ELECTRODE REM PT RTRN 9FT ADLT (ELECTROSURGICAL) ×2 IMPLANT
EVACUATOR 1/8 PVC DRAIN (DRAIN) ×3 IMPLANT
EVACUATOR SILICONE 100CC (DRAIN) ×1 IMPLANT
FACESHIELD LNG OPTICON STERILE (SAFETY) ×15 IMPLANT
FLOSEAL (HEMOSTASIS) ×1 IMPLANT
GAUZE KERLIX 2  STERILE LF (GAUZE/BANDAGES/DRESSINGS) ×4 IMPLANT
GAUZE SPONGE 4X4 16PLY XRAY LF (GAUZE/BANDAGES/DRESSINGS) ×4 IMPLANT
GAUZE XEROFORM 5X9 LF (GAUZE/BANDAGES/DRESSINGS) ×4 IMPLANT
GLOVE BIOGEL PI IND STRL 7.5 (GLOVE) ×2 IMPLANT
GLOVE BIOGEL PI IND STRL 8 (GLOVE) ×2 IMPLANT
GLOVE BIOGEL PI IND STRL 8.5 (GLOVE) IMPLANT
GLOVE BIOGEL PI INDICATOR 7.5 (GLOVE) ×1
GLOVE BIOGEL PI INDICATOR 8 (GLOVE) ×1
GLOVE BIOGEL PI INDICATOR 8.5 (GLOVE)
GLOVE ECLIPSE 8.0 STRL XLNG CF (GLOVE) IMPLANT
GLOVE ORTHO TXT STRL SZ7.5 (GLOVE) ×6 IMPLANT
GLOVE SURG ORTHO 8.0 STRL STRW (GLOVE) ×3 IMPLANT
GOWN STRL NON-REIN LRG LVL3 (GOWN DISPOSABLE) ×3 IMPLANT
HANDPIECE INTERPULSE COAX TIP (DISPOSABLE) ×3
IMMBOLIZER KNEE 19 BLUE UNIV (SOFTGOODS) ×2 IMPLANT
IMMOBILIZER KNEE 20 (SOFTGOODS) ×3
IMMOBILIZER KNEE 20 THIGH 36 (SOFTGOODS) ×1 IMPLANT
KIT BASIN OR (CUSTOM PROCEDURE TRAY) ×3 IMPLANT
MANIFOLD NEPTUNE II (INSTRUMENTS) ×3 IMPLANT
NDL SAFETY ECLIPSE 18X1.5 (NEEDLE) ×2 IMPLANT
NEEDLE HYPO 18GX1.5 SHARP (NEEDLE) ×3
NS IRRIG 1000ML POUR BTL (IV SOLUTION) ×6 IMPLANT
PACK TOTAL JOINT (CUSTOM PROCEDURE TRAY) ×3 IMPLANT
PADDING CAST COTTON 6X4 STRL (CAST SUPPLIES) ×6 IMPLANT
POSITIONER SURGICAL ARM (MISCELLANEOUS) ×3 IMPLANT
SET HNDPC FAN SPRY TIP SCT (DISPOSABLE) ×2 IMPLANT
SPONGE GAUZE 4X4 12PLY (GAUZE/BANDAGES/DRESSINGS) ×6 IMPLANT
SPONGE LAP 18X18 X RAY DECT (DISPOSABLE) ×3 IMPLANT
STAPLER VISISTAT 35W (STAPLE) ×4 IMPLANT
STRIP CLOSURE SKIN 1/2X4 (GAUZE/BANDAGES/DRESSINGS) ×6 IMPLANT
SUCTION FRAZIER 12FR DISP (SUCTIONS) ×3 IMPLANT
SUT ETHILON 2 0 PS N (SUTURE) ×6 IMPLANT
SUT MNCRL AB 4-0 PS2 18 (SUTURE) ×3 IMPLANT
SUT VIC AB 1 CT1 36 (SUTURE) ×9 IMPLANT
SUT VIC AB 2-0 CT1 27 (SUTURE) ×9
SUT VIC AB 2-0 CT1 TAPERPNT 27 (SUTURE) ×6 IMPLANT
SWAB COLLECTION DEVICE MRSA (MISCELLANEOUS) ×5 IMPLANT
SYR 50ML LL SCALE MARK (SYRINGE) ×3 IMPLANT
SYR CONTROL 10ML LL (SYRINGE) ×3 IMPLANT
TOWEL OR 17X26 10 PK STRL BLUE (TOWEL DISPOSABLE) ×6 IMPLANT
TRAY FOLEY CATH 14FRSI W/METER (CATHETERS) ×1 IMPLANT
TUBE ANAEROBIC SPECIMEN COL (MISCELLANEOUS) ×5 IMPLANT
WATER STERILE IRR 1500ML POUR (IV SOLUTION) ×5 IMPLANT
WRAP KNEE MAXI GEL POST OP (GAUZE/BANDAGES/DRESSINGS) ×2 IMPLANT

## 2010-12-25 NOTE — Progress Notes (Signed)
Patient refused foley md notified

## 2010-12-25 NOTE — Brief Op Note (Signed)
12/25/2010  2:37 PM  PATIENT:  [name]  70 y.o. male  PRE-OPERATIVE DIAGNOSIS:  Right Knee Persistent Non Healing Wound  POST-OPERATIVE DIAGNOSIS:  right knee persistent non healing wound  PROCEDURE:  Procedure(s): 1. Repeat I&D right knee, sharp excisional debridement 2. EXCISIONAL TOTAL KNEE ARTHROPLASTY, removal antibiotic spacer, placement of new antibiotic spacer  SURGEON:  Surgeon(s): Shelda Pal  PHYSICIAN ASSISTANT: Lanney Gins, PA-C   ASSISTANTS: none   ANESTHESIA:   general  EBL:  Total I/O In: 1300 [I.V.:1000; IV Piggyback:300] Out: 100 [Blood:100]  BLOOD ADMINISTERED:none  DRAINS: (1) Hemovact drain(s) in the right knee with  Suction Open   LOCAL MEDICATIONS USED:  NONE  SPECIMEN:  Aspirate  DISPOSITION OF SPECIMEN:  N/A  COUNTS:  YES  TOURNIQUET:   Total Tourniquet Time Documented: Thigh (Right) - 65 minutes  DICTATION: .Other Dictation: Dictation Number [id number]  PLAN OF CARE: Admit to inpatient   PATIENT DISPOSITION:  PACU - hemodynamically stable.   Delay start of Pharmacological VTE agent (>24hrs) due to surgical blood loss or risk of bleeding:  no

## 2010-12-25 NOTE — Transfer of Care (Signed)
Immediate Anesthesia Transfer of Care Note  Patient: [name]  Procedure(s) Performed:  EXCISIONAL TOTAL KNEE ARTHROPLASTY - repeat irrigation   and debridement, removal and reinsertation of spacer block  Patient Location: PACU  Anesthesia Type: General  Level of Consciousness: awake, alert , oriented and patient cooperative  Airway & Oxygen Therapy: Patient Spontanous Breathing and Patient connected to face mask oxygen  Post-op Assessment: Report given to PACU RN  Post vital signs: Reviewed and stable  Complications: No apparent anesthesia complications

## 2010-12-25 NOTE — Anesthesia Preprocedure Evaluation (Addendum)
Anesthesia Evaluation  Patient identified by MRN, date of birth, ID band Patient awake    Reviewed: Allergy & Precautions, H&P , NPO status , Patient's Chart, lab work & pertinent test results, reviewed documented beta blocker date and time   History of Anesthesia Complications Negative for: history of anesthetic complications  Airway Mallampati: I TM Distance: >3 FB     Dental  (+) Edentulous Upper and Edentulous Lower   Pulmonary sleep apnea ,  Patient states he does not tolerate CPAP clear to auscultation  Pulmonary exam normal       Cardiovascular hypertension, + CAD, + Past MI and + Cardiac Stents Regular Normal    Neuro/Psych  Headaches, PSYCHIATRIC DISORDERS  Neuromuscular disease    GI/Hepatic Neg liver ROS, PUD,   Endo/Other  Negative Endocrine ROS  Renal/GU negative Renal ROS  Genitourinary negative   Musculoskeletal  (+) Arthritis -,   Abdominal   Peds negative pediatric ROS (+)  Hematology negative hematology ROS (+)   Anesthesia Other Findings   Reproductive/Obstetrics negative OB ROS                           Anesthesia Physical Anesthesia Plan  ASA: III  Anesthesia Plan: General   Post-op Pain Management:    Induction: Intravenous  Airway Management Planned: Oral ETT  Additional Equipment:   Intra-op Plan:   Post-operative Plan: Extubation in OR  Informed Consent: I have reviewed the patients History and Physical, chart, labs and discussed the procedure including the risks, benefits and alternatives for the proposed anesthesia with the patient or authorized representative who has indicated his/her understanding and acceptance.   Dental advisory given  Plan Discussed with: CRNA  Anesthesia Plan Comments:         Anesthesia Quick Evaluation

## 2010-12-25 NOTE — Anesthesia Postprocedure Evaluation (Signed)
  Anesthesia Post-op Note  Patient: [name]  Procedure(s) Performed:  EXCISIONAL TOTAL KNEE ARTHROPLASTY - repeat irrigation   and debridement, removal and reinsertation of spacer block  Patient Location: PACU  Anesthesia Type: General  Level of Consciousness: awake, alert  and oriented  Airway and Oxygen Therapy: Patient Spontanous Breathing and Patient connected to nasal cannula oxygen  Post-op Pain: mild  Post-op Assessment: Post-op Vital signs reviewed, Patient's Cardiovascular Status Stable and Respiratory Function Stable  Post-op Vital Signs: stable  Complications: No apparent anesthesia complications

## 2010-12-25 NOTE — H&P (Signed)
NAMESOLOMON, SKOWRONEK NO.:  [id number]      MEDICAL RECORD NO.:  [id number]      LOCATION:                                 FACILITY:      PHYSICIAN:  Madlyn Frankel. Charlann Boxer, M.D.  DATE OF BIRTH:  January 15, 1941      DATE OF ADMISSION:  12/25/2010   DATE OF DISCHARGE:                                 HISTORY          The patient is to be admitted on Tuesday Nov. 7th, 2012.      ADMISSION DIAGNOSIS:  Infected total knee arthroplasty on the right with persistent non-healing wound.      HISTORY OF PRESENT ILLNESS:  This is a 70 year old gentleman with a   history of total knee arthroplasty on his right knee in July of this   year with subsequent wound breakdown, with subsequent I and D, and has   been under wound management since then with dressing changes, IV   antibiotics through PICC line in the form of Rocephin. In October of this year I had performed a resection of the right TKR.   He has had   recurrent drainage and nonhealing wound.  He has not had fevers or   constitutional symptoms.  Today his wound   has not healed.  We discussed with the   patient the findings and treatments.  We would recommend admission to   the hospital on Tuesday for repeat I&D of his right knee with attempt at primary wound closure and possibly exchange of the anitbiotic spacer.  The surgery, risks, benefits, and   aftercare were discussed with the patient and his wife.  The surgery to   go ahead as scheduled.      PAST MEDICAL HISTORY:  Drug allergies, none.      CURRENT MEDICATIONS:   1. Bisoprolol 1/2 tablet daily.   2. Simvastatin 40 mg q.h.s.   3. Aspirin 325 mg daily.   4. Rocephin.   5. MS Contin.      PAST SURGICAL HISTORY:   1. Total knee arthroplasty on the left.   2. Total knee arthroplasty on the right.   3. Multiple back surgeries.      SERIOUS MEDICAL ILLNESSES:   1. History of coronary artery disease with MI and stent.  No problems       currently.   2. History of  chronic pain.      FAMILY HISTORY:  Positive for coronary artery disease and CVA.      SOCIAL HISTORY:  The patient is married.  He smokes cigars and does not   drink.      REVIEW OF SYSTEMS:  CENTRAL NERVOUS SYSTEM:  Positive for occasional   headache.  PULMONARY:  Negative for shortness of breath, PND, or   orthopnea.  CARDIOVASCULAR:  Positive for history of MI with stent.  No   angina or chest pains currently.  GI:  Negative for ulcers, hepatitis.   GU:  Negative for urinary tract difficulty.  MUSCULOSKELETAL:  Positive   in HPI.      PHYSICAL EXAMINATION:  VITAL SIGNS:  BP 148/82, respirations 16, pulse   80 and regular.   GENERAL APPEARANCE:  This is a well-nourished gentleman in no acute   distress.   HEENT:  Head, normocephalic.  Nose, patent.  Ears, patent.  Pupils   equal, round to light.  Throat, without injection.   NECK:  Supple without adenopathy.  Carotids 2+ without bruit.   CHEST:  Clear to auscultation.  No rales or rhonchi.  Respirations 16.   HEART:  Regular rate and rhythm at 80 beats without murmur.   ABDOMEN:  Soft with active bowel sounds.  No masses or organomegaly.   NEUROLOGIC:  The patient is alert and oriented to time, place, and   person.  Cranial nerves II through XII are grossly intact.   EXTREMITIES:  Shows the right knee with open granulating wound status   post total knee arthroplasty with retention sutures.  It is currently   draining.  There is mild redness.  No warmth.  Sensation and circulation   are intact.  Calf is negative.      RADIOGRAPHIC DATA:  X-rays of the total knee are repeated today and show   the prosthesis in place in the bone with soft tissue swelling.      IMPRESSION:  Infected total knee arthroplasty on the right with persistently draining wound.      PLAN:  Repeat I&D of his right knee, possible exchange of antibiotic spacer.

## 2010-12-25 NOTE — Op Note (Signed)
[name], [name] NO.:  [id number]  MEDICAL RECORD NO.:  [id number]  LOCATION:  1607                         FACILITY:  [facility]  PHYSICIAN:  Madlyn Frankel. Charlann Boxer, M.D.  DATE OF BIRTH:  Jul 31, 1940  DATE OF PROCEDURE:  12/25/2010 DATE OF DISCHARGE:                              OPERATIVE REPORT   PREOPERATIVE DIAGNOSIS:  Persistent infection following a right total knee replacement, status post I and D, and placement of antibiotic spacer with some persistent wound drainage.  POSTOPERATIVE DIAGNOSIS:  Persistent infection following a right total knee replacement, status post I and D, and placement of antibiotic spacer with some persistent wound drainage.  PROCEDURE PERFORMED:  Repeat sharp and excisional debridement of his right knee with removal of previous antibiotic spacer and reimplantation of right knee antibiotic spacer.  Please note that the antibiotic spacer was 2 g of vancomycin, 2.4 g of tobramycin mixed with 3 batches of cement.  SURGEON:  Madlyn Frankel. Charlann Boxer, M.D.  SURGICAL ASSISTANT:  Lanney Gins, PA-C.  Please note that Lanney Gins was present for the entirety of the case, present for preoperative positioning, perioperative retractor management, general facilitation of the case as well as primary wound closure.  ANESTHESIA:  General.  SPECIMENS:  I did send two samples, one in the superficial area and one into the joint to reassess any potential joint fluid in the knee given the fact that he had been on Rocephin for an extended period of time.  DRAINS:  One Hemovac.  TOURNIQUET TIME:  70 minutes at 250 mmHg.  COMPLICATIONS:  None apparent.  INDICATION FOR PROCEDURE:  [name] is a 70 year old gentleman with a history of an right total knee replacement in July 2012.  He had persistent wound problems that ultimately led to a subsequent arthroplasty in October.  He had a significant wound issue at that time, it was addressed with an  attempt at primary wound closure.  He was seen in followup at that time, which had revealed some persistent area of drainage.  Given the potential for a repeat reimplantation of his total knee replacement, I felt it is important to repeat the I and D to try to get his wound to heal primarily.  Risks and benefits, and indications were reviewed.  Consent was obtained after reviewing these findings.  PROCEDURE IN DETAIL:  The patient was brought to the operative theater. Once adequate anesthesia was established, the patient had already been on Rocephin that was readministered today as well as vancomycin dose. He was positioned to supine.  The right thigh tourniquet was placed. The right lower extremity was then prepped and draped in a sterile fashion.  A time-out was performed identifying the patient, planned procedure, and extremity.  The leg was exsanguinated, tourniquet elevated to 250 mmHg.  The patient's old incision was utilized.  I excised the segment, the distal third of the wound where it was a problematic area to begin.  Once this area was excised, soft tissue planes were created.  I did undermine medially and found what appeared superficially to be kind of some erythema as a result of hematoma.  This is what it appeared like.  I  did go ahead and took a sample of this to make certain there was no sign of infection.  Once soft tissue planes were created, I irrigated this superficial wound with about 500 mL of normal saline solution, pulse lavage.  I then made an arthrotomy along this previous arthrotomy.  Following an extensive debridement with removal of previous antibiotic spacer, I was able to further debride the knee around the tibia, the patella, as well as in the femur.  Following this extensive sharp debridement, I was able then to irrigate with 2 L of normal saline solution and pulse lavage.  Please note that at the time of the arthrotomy, a culture swab was taken of  the intraarticular fluid.  Following the irrigation, I mixed up 3 batches of cement mixed with 2 g of vancomycin and 2.4 g of tobramycin per bag of cement.  Once this was partially cured, a portion of cement was placed into the femur and into the tibia, and then a cement spacer was placed between and suction was placed into the suprapatellar pouch.  At this point, we reapproximated the extensor mechanism using #1 PDS. The subcuticular skin was then reapproximated with 2-0 Vicryl.  This was the case with the vast majority of the wound.  There was only a small segment, probably about 2 cm long that was a bit tenuous.  What I ended up doing at this point was reapproximating the skin with staples and at this point, used a single nylon suture just to reapproximate as best as possible though I left a small area that was stellate in appearance, __________ felt that this was __________ it would at least provide some intact skin.  The skin appeared to be healthy at this point. The tourniquet was let down after 70 minutes.  A medium Hemovac drain was placed in the deep subcutaneous tissue.  The knee was then dressed with Xeroform and a bulky sterile wrap.  He was then brought to recovery in stable condition tolerating the procedure well with an Ace wrap on the knee.  He was extubated and brought to recovery in stable condition with plans to resume his Rocephin.     Madlyn Frankel Charlann Boxer, M.D.     MDO/MEDQ  D:  12/25/2010  T:  12/25/2010  Job:  [id number]

## 2010-12-26 LAB — BASIC METABOLIC PANEL
GFR calc Af Amer: >90 mL/min (ref >90)
GFR calc non Af Amer: 84 mL/min — ABNORMAL LOW (ref >90)
Potassium: 4.6 mEq/L (ref 3.5–5.1)
Sodium: 130 mEq/L — ABNORMAL LOW (ref 135–145)

## 2010-12-26 LAB — CBC
MCHC: 31.2 g/dL (ref 30.0–36.0)
Platelets: 334 10*3/uL (ref 150–400)
RDW: 16 % — ABNORMAL HIGH (ref 11.5–15.5)

## 2010-12-26 MED ORDER — METHOCARBAMOL 100 MG/ML IJ SOLN: 500.0000 mg | Freq: Four times a day (QID) | INTRAVENOUS | Status: DC | PRN

## 2010-12-26 MED ORDER — OXYCODONE HCL 5 MG PO TABS
5.0000 mg | ORAL_TABLET | ORAL | Status: DC | PRN
  Administered 2010-12-26: 5 mg via ORAL
  Administered 2010-12-26 – 2010-12-28 (×8): 10 mg via ORAL
  Filled 2010-12-26 (×7): qty 2
  Filled 2010-12-26: qty 1
  Filled 2010-12-26: qty 2

## 2010-12-26 NOTE — Progress Notes (Addendum)
Physical Therapy Evaluation Patient Details Name: [name] MRN: [id number] DOB: 1940-04-02 Today's Date: 12/26/2010 1110-1138,eval2 Problem List:  Patient Active Problem List  Diagnoses  . Coronary artery disease  . Hyperlipidemia  . Pre-operative cardiovascular examination, high risk surgery  . MELANOMA, SPREADING, SUPERFICIAL  . HYPERLIPIDEMIA  . ERECTILE DYSFUNCTION  . SLEEP APNEA, OBSTRUCTIVE, MODERATE  . OTHER IDIOPATHIC PERIPHERAL AUTONOMIC NEUROPATHY  . HYPERTENSION  . CORONARY ARTERY DISEASE  . CEREBRAL ATHEROSCLEROSIS  . PNEUMONIA  . PEPTIC ULCER DISEASE  . BENIGN PROSTATIC HYPERTROPHY, WITH URINARY OBSTRUCTION  . ACTINIC KERATOSIS  . KNEE PAIN, ACUTE  . SPINAL STENOSIS OF LUMBAR REGION  . LOW BACK PAIN  . CALF PAIN, LEFT  . HEADACHE  . SYMPTOM, NOCTURIA  . SPRAIN&STRAIN OF UNSPECIFIED SITE OF KNEE&LEG    Past Medical History:  Past Medical History  Diagnosis Date  . Hypertension   . Hyperlipidemia   . Preoperative cardiovascular examination   . Spinal stenosis, lumbar   . CAS (cerebral atherosclerosis)   . Erectile dysfunction   . Preventative health care   . Benign prostatic hypertrophy with urinary obstruction   . Family history of early CAD     male 1st degree relative <50  . Peptic ulcer disease   . Low back pain   . Superficial spreading melanoma   . Sleep apnea, obstructive     CPAP-does not use  . Coronary artery disease 1998    cardiac stent  . HA (headache)     sinus headaches  . Arthritis     knees  . Myocardial infarction 1997    Hx MI 1997 and 1998  . Anxiety   . Pneumonia 2005  . Anemia   . Depression     takes Zoloft  . Neuropathy, autonomic, idiopathic peripheral, other   . Blood transfusion 1 yr. ago    jerking,fever-after blood transfusion  . Blood transfusion     given wrong blood   Past Surgical History:  Past Surgical History  Procedure Date  . Esophagogastroduodenoscopy 2004  . Colonoscopy 2004  . Lumbar  laminectomy   . Lumbar fusion   . Knee arthroscopy     left  . Shoulder arthroscopy     right  . Knee arthroscopy     right  . Decompression of the median nerve     left wrist and hand  . Revision of tkr 2011    on left  . Coronary stent placement   . Back surgery 2005-last    lumbar x2  . Coronary angioplasty 1998  . Joint replacement 2011    left knee x 2 ; 08/2010-right knee-infected    PT Assessment/Plan/Recommendation PT Assessment Clinical Impression Statement: Pt will benefit from PT to maximize I to allow return home with family. PT Recommendation/Assessment: Patient will need skilled PT in the acute care venue PT Problem List: Decreased strength;Decreased activity tolerance;Decreased balance;Decreased mobility;Decreased knowledge of precautions;Decreased knowledge of use of DME Barriers to Discharge: None PT Therapy Diagnosis : Difficulty walking;Acute pain PT Plan PT Frequency: Min 6X/week PT Treatment/Interventions: DME instruction;Gait training;Stair training;Functional mobility training;Patient/family education;Balance training PT Recommendation Follow Up Recommendations: Home health PT Equipment Recommended: None recommended by PT PT Goals  Acute Rehab PT Goals PT Goal Formulation: With patient Pt will go Supine/Side to Sit: with supervision;with HOB 0 degrees PT Goal: Supine/Side to Sit - Progress: Other (comment) Pt will Transfer Sit to Stand/Stand to Sit: with supervision PT Transfer Goal: Sit to Stand/Stand to Sit -  Progress: Other (comment) Pt will Ambulate: 51 - 150 feet;with supervision;with rolling walker PT Goal: Ambulate - Progress: Other (comment) Pt will Go Up / Down Stairs: 3-5 stairs;with least restrictive assistive device;with min assist PT Goal: Up/Down Stairs - Progress: Other (comment)  PT Evaluation Precautions/Restrictions  Precautions Precautions: Fall Required Braces or Orthoses: Yes Knee Immobilizer: On at all  times Restrictions Weight Bearing Restrictions: Yes RLE Weight Bearing: Touchdown weight bearing Prior Functioning  Home Living Lives With: Spouse Type of Home: House Home Layout: One level Home Access: Stairs to enter Entrance Stairs-Rails: Can reach both Entrance Stairs-Number of Steps: 4 Home Adaptive Equipment: Walker - rolling;Bedside commode/3-in-1 Prior Function Level of Independence: Requires assistive device for independence Cognition Cognition Orientation Level: Oriented X4 Sensation/Coordination   Extremity Assessment RLE AROM (degrees) Right Ankle Dorsiflexion 0-20: 10  (grossly 10*;limited df) RLE Strength Right Hip Flexion: 2+/5 Right Hip Extension: 2+/5 LLE Assessment LLE Assessment: Within Functional Limits (grossly) Mobility (including Balance) Bed Mobility Bed Mobility: Yes Supine to Sit: 4: Min assist Supine to Sit Details (indicate cue type and reason): min assist with RLE Transfers Transfers: Yes Sit to Stand: 1: +2 Total assist;Patient percentage (comment) Sit to Stand Details (indicate cue type and reason): pt=85%;+2 for balnce and safety;verbal cues for TDWB and RW safety Stand to Sit: 4: Min assist Stand to Sit Details: min Assist with RLE;verbal cues with hand placement Ambulation/Gait Ambulation/Gait: Yes Ambulation/Gait Assistance: 1: +2 Total assist;Patient percentage (comment) Ambulation/Gait Assistance Details (indicate cue type and reason): +2 for balance and safety;verbal cues for posture and RW position; pt amb 10'fwd and 10' back; narrow BOS Ambulation Distance (Feet): 20 Feet Assistive device: Rolling walker    Exercise    End of Session PT - End of Session Equipment Utilized During Treatment: Right knee immobilizer Activity Tolerance: Patient limited by fatigue Patient left: in chair;with call bell in reach;with family/visitor present General Behavior During Session: [facility] for tasks performed Cognition: [facility] for tasks  performed  [facility] 12/26/2010, 12:03 PM

## 2010-12-26 NOTE — Clinical Documentation Improvement (Signed)
Please update your documentation within the medical record to reflect your response to this query.                                                                                   12/26/10  Dear Dr. Lucretia Kern  In a better effort to capture your patient's severity of illness, reflect appropriate length of stay and utilization of resources, a review of the medical record has revealed the following indicators.    Based on your clinical judgment, please clarify and document in a progress note and/or discharge summary the clinical condition associated with the following supporting information:  Pt had EXCISIONAL TOTAL KNEE ARTHROPLASTY on 12/25/10  According to Post Op H/H= 7..8/25.0  Please clarify the underlying diagnosis responsible for the abn H/H and document in the pn and d/c summary.  Other Condition__________________                   Cannot Clinically Determine____X_____    Risk Factors: EXCISIONAL TOTAL KNEE ARTHROPLASTY; Infected TKA  Diagnostics 12/24/2010 14:30 HGB: 9.5 (L) HCT: 30.1 (L)  12/26/2010 04:45 HGB: 7.8 (L) HCT: 25.0 (L)  Treatment Ferrous Sulfate multivitamins ther. w/minerals tablet 1 tablet NS KCL 20 Meq    Reviewed:  no additional documentation provided  In responding to this query please exercise your independent judgment.  The fact that a query is asked, does not imply that any particular answer is desired or expected.  Abnormal findings (laboratory, x-ray, pathologic, and other diagnostic results) are not coded and reported unless the physician indicates their clinical significance.   The medical record reflects the following clinical findings, please clarify the diagnostic and/or clinical significance:        Thank Barnabas Harries  Clinical Documentation Specialist Office [phone]

## 2010-12-26 NOTE — Progress Notes (Signed)
Physical Therapy Treatment Patient Details Name: DEMETRIO LEIGHTY MRN: [id number] DOB: 1940-07-28 Today's Date: 11/7/20121153-1312/ 1TA PT Assessment/Plan  PT - Assessment/Plan PT Plan: Discharge plan remains appropriate;Frequency remains appropriate PT Frequency: Min 6X/week Follow Up Recommendations: Home health PT Equipment Recommended: None recommended by PT PT Goals  Acute Rehab PT Goals PT Goal Formulation: With patient Pt will go Supine/Side to Sit: with supervision;with HOB 0 degrees PT Goal: Supine/Side to Sit - Progress: Other (comment) Pt will Transfer Sit to Stand/Stand to Sit: with supervision PT Transfer Goal: Sit to Stand/Stand to Sit - Progress: Other (comment) Pt will Ambulate: 51 - 150 feet;with supervision;with rolling walker PT Goal: Ambulate - Progress: Other (comment) Pt will Go Up / Down Stairs: 3-5 stairs;with least restrictive assistive device;with min assist PT Goal: Up/Down Stairs - Progress: Other (comment)  PT Treatment Precautions/Restrictions  Precautions Precautions: Knee Required Braces or Orthoses: Yes Knee Immobilizer: On at all times Restrictions Weight Bearing Restrictions: Yes RLE Weight Bearing: Touchdown weight bearing Mobility (including Balance) Bed Mobility Bed Mobility: Yes Supine to Sit: 5: Supervision Supine to Sit Details (indicate cue type and reason): verbal cues for technique Transfers Transfers: Yes Sit to Stand: 4: Min assist;From chair/3-in-1;With armrests Sit to Stand Details (indicate cue type and reason): verbal cues for hand placement Stand to Sit: 4: Min assist;To bed;To elevated surface Stand to Sit Details: min/guard for safety and control Ambulation/Gait Ambulation/Gait: Yes Ambulation/Gait Assistance: 4: Min assist Ambulation/Gait Assistance Details (indicate cue type and reason): verbal cues for TDWB and RW safety Ambulation Distance (Feet): 4 Feet Assistive device: Rolling walker    Exercise  Total Joint  Exercises Ankle Circles/Pumps: AROM;AAROM;Right;Both (right achilles stretch/AAROM) End of Session PT - End of Session Equipment Utilized During Treatment: Right knee immobilizer Activity Tolerance:  (Hgb. 7.5 today) Patient left: in bed General Behavior During Session: [facility] for tasks performed Cognition: [facility] for tasks performed  [facility] 12/26/2010, 1:31 PM

## 2010-12-27 LAB — CBC
Hemoglobin: 7.3 g/dL — ABNORMAL LOW (ref 13.0–17.0)
RBC: 2.75 MIL/uL — ABNORMAL LOW (ref 4.22–5.81)

## 2010-12-27 LAB — WOUND CULTURE: Culture: NO GROWTH

## 2010-12-27 LAB — BASIC METABOLIC PANEL
CO2: 26 mEq/L (ref 19–32)
GFR calc non Af Amer: 86 mL/min — ABNORMAL LOW (ref >90)
Glucose, Bld: 99 mg/dL (ref 70–99)
Potassium: 4.1 mEq/L (ref 3.5–5.1)
Sodium: 130 mEq/L — ABNORMAL LOW (ref 135–145)

## 2010-12-27 NOTE — Progress Notes (Addendum)
Occupational Therapy Evaluation Patient Details Name: [name] MRN: [id number] DOB: October 08, 1940 Today's Date: 12/27/2010 Time: 8:40-9:25 am Problem List:  Patient Active Problem List  Diagnoses  . Coronary artery disease  . Hyperlipidemia  . Pre-operative cardiovascular examination, high risk surgery  . MELANOMA, SPREADING, SUPERFICIAL  . HYPERLIPIDEMIA  . ERECTILE DYSFUNCTION  . SLEEP APNEA, OBSTRUCTIVE, MODERATE  . OTHER IDIOPATHIC PERIPHERAL AUTONOMIC NEUROPATHY  . HYPERTENSION  . CORONARY ARTERY DISEASE  . CEREBRAL ATHEROSCLEROSIS  . PNEUMONIA  . PEPTIC ULCER DISEASE  . BENIGN PROSTATIC HYPERTROPHY, WITH URINARY OBSTRUCTION  . ACTINIC KERATOSIS  . KNEE PAIN, ACUTE  . SPINAL STENOSIS OF LUMBAR REGION  . LOW BACK PAIN  . CALF PAIN, LEFT  . HEADACHE  . SYMPTOM, NOCTURIA  . SPRAIN&STRAIN OF UNSPECIFIED SITE OF KNEE&LEG    Past Medical History:  Past Medical History  Diagnosis Date  . Hypertension   . Hyperlipidemia   . Preoperative cardiovascular examination   . Spinal stenosis, lumbar   . CAS (cerebral atherosclerosis)   . Erectile dysfunction   . Preventative health care   . Benign prostatic hypertrophy with urinary obstruction   . Family history of early CAD     male 1st degree relative <50  . Peptic ulcer disease   . Low back pain   . Superficial spreading melanoma   . Sleep apnea, obstructive     CPAP-does not use  . Coronary artery disease 1998    cardiac stent  . HA (headache)     sinus headaches  . Arthritis     knees  . Myocardial infarction 1997    Hx MI 1997 and 1998  . Anxiety   . Pneumonia 2005  . Anemia   . Depression     takes Zoloft  . Neuropathy, autonomic, idiopathic peripheral, other   . Blood transfusion 1 yr. ago    jerking,fever-after blood transfusion  . Blood transfusion     given wrong blood   Past Surgical History:  Past Surgical History  Procedure Date  . Esophagogastroduodenoscopy 2004  . Colonoscopy 2004  .  Lumbar laminectomy   . Lumbar fusion   . Knee arthroscopy     left  . Shoulder arthroscopy     right  . Knee arthroscopy     right  . Decompression of the median nerve     left wrist and hand  . Revision of tkr 2011    on left  . Coronary stent placement   . Back surgery 2005-last    lumbar x2  . Coronary angioplasty 1998  . Joint replacement 2011    left knee x 2 ; 08/2010-right knee-infected    OT Assessment/Plan/Recommendation OT Assessment OT Recommendation/Assessment: Patient will need skilled OT in the acute care venue OT Problem List: Decreased activity tolerance;Decreased knowledge of use of DME or AE;Decreased knowledge of precautions;Pain Problem List Comments: Pt requires VC's for TDWB R LE during functional tasks OT Therapy Diagnosis : Acute pain;Generalized weakness OT Plan OT Frequency: Min 2X/week OT Treatment/Interventions: Self-care/ADL training;DME and/or AE instruction;Therapeutic activities;Patient/family education OT Recommendation Equipment Recommended: None recommended by OT Individuals Consulted Consulted and Agree with Results and Recommendations: Patient OT Goals Acute Rehab OT Goals OT Goal Formulation: With patient ADL Goals Pt Will Perform Grooming: with modified independence;Standing at sink ADL Goal: Grooming - Progress: Progressing toward goals Pt Will Perform Lower Body Bathing: with min assist;Sitting, edge of bed;Sitting, chair;with adaptive equipment;with set-up;with caregiver independent in assisting ADL Goal: Lower Body  Bathing - Progress: Progressing toward goals Pt Will Perform Lower Body Dressing: with set-up;with min assist;with caregiver independent in assisting;Sitting, chair;Sit to stand from chair ADL Goal: Lower Body Dressing - Progress: Progressing toward goals Additional ADL Goal #1: Pt will be Supervision for toileting tranfers/all aspects using RW and 3:1 over toilet ADL Goal: Additional Goal #1 - Progress: Progressing  toward goals  OT Evaluation BP orthostatic resting (secondary to pt c/o "dizziness" w/ RN staff earlier in day): Supine = 119/63, pulse 66, 98% O2 on RA; Sit EOB =91/51 pulse 66, O2 98%; after activity In Chair = 109/57 99% O2 on RA, pulse 76. Precautions/Restrictions  Precautions Precautions: Knee Required Braces or Orthoses: Yes Knee Immobilizer: On at all times Restrictions Weight Bearing Restrictions: No RLE Weight Bearing: Weight bearing as tolerated Prior Functioning Home Living Bathroom Shower/Tub: Walk-in shower Bathroom Toilet: Standard (3:1 over toilet) Bathroom Accessibility: Yes How Accessible: Accessible via walker Home Adaptive Equipment: Bedside commode/3-in-1;Reacher;Shower chair with back;Walker - rolling Prior Function Level of Independence: Requires assistive device for independence;Needs assistance with ADLs Bath: Minimal (Lower extremities) Dressing: Minimal (Lower extremities) ADL ADL Grooming: Performed;Shaving;Wash/dry hands;Wash/dry face;Brushing hair;Modified independent;Set up Grooming Details (indicate cue type and reason): Set up assist at chair level Where Assessed - Grooming: Sitting, chair Upper Body Bathing: Performed;Chest;Right arm;Left arm;Abdomen;Modified independent Where Assessed - Upper Body Bathing: Sitting, chair;Supported Lower Body Bathing: Simulated;Minimal assistance Lower Body Bathing Details (indicate cue type and reason): Min A lower extremities (Pt req Assist PTA from spouse). Where Assessed - Lower Body Bathing: Sitting, chair;Supported Location manager Dressing: Performed;Modified independent Where Assessed - Upper Body Dressing: Sitting, chair Lower Body Dressing: Performed;Minimal assistance Lower Body Dressing Details (indicate cue type and reason): Don/Doff KI and don/doff socks bilateral LE's Where Assessed - Lower Body Dressing: Supine, head of bed up Toilet Transfer: Performed;Minimal assistance Toilet Transfer Details  (indicate cue type and reason): Min guard assist, VC's for safety Toilet Transfer Method: Ambulating Toilet Transfer Equipment: Raised toilet seat with arms (or 3-in-1 over toilet) Toileting - Clothing Manipulation: Simulated;Supervision/safety Toileting - Clothing Manipulation Details (indicate cue type and reason): VC's for safety Where Assessed - Toileting Clothing Manipulation: Sit to stand from 3-in-1 or toilet Toileting - Hygiene: Simulated;Supervision/safety Toileting - Hygiene Details (indicate cue type and reason): VC's and SBA for safety Where Assessed - Toileting Hygiene: Sit on 3-in-1 or toilet Equipment Used: Rolling walker;Other (comment) (Gait belt) Vision/Perception  Vision - History Baseline Vision: Wears glasses all the time Patient Visual Report: No change from baseline Cognition Cognition Arousal/Alertness: Awake/alert Overall Cognitive Status: Appears within functional limits for tasks assessed Orientation Level: Oriented X4 Sensation/Coordination Sensation Light Touch: Appears Intact Extremity Assessment RUE Assessment RUE Assessment: Within Functional Limits LUE Assessment LUE Assessment: Within Functional Limits Mobility  Bed Mobility Bed Mobility: Yes Supine to Sit: 5: Supervision Supine to Sit Details (indicate cue type and reason): Vc's for technique Transfers Transfers:  (VC's for TDWB R LE) Sit to Stand: 4: Min assist Sit to Stand Details (indicate cue type and reason): Min guard assist with VC's Stand to Sit: 4: Min assist;To chair/3-in-1 Stand to Sit Details: Minguard assist to 3:1; chair Exercises   End of Session OT - End of Session Equipment Utilized During Treatment: Gait belt;Right knee immobilizer;Other (comment) (RW) Activity Tolerance: Patient tolerated treatment well Patient left: in chair;with call bell in reach Nurse Communication: Other (comment) (Clarification of diet, pt wanted coke to drink) General Behavior During Session:  [facility] for tasks performed Cognition: [facility] for tasks performed  Roselie Awkward Dixon 12/27/2010, 12:07 PM

## 2010-12-27 NOTE — Progress Notes (Signed)
  Subjective: 2 Days Post-Op Procedure(s) (LRB): EXCISIONAL TOTAL KNEE ARTHROPLASTY (Right)  Patient reports pain as mild.  Objective:   VITALS:   Filed Vitals:   12/27/10 0500  BP: 108/56  Pulse: 64  Temp: 99.9 F (37.7 C)  Resp: 16    Neurovascular intact Intact pulses distally Dorsiflexion/Plantar flexion intact Incision: dressing C/D/I No cellulitis present Compartment soft  LABS  Basename 12/27/10 0423 12/26/10 0445 12/24/10 1430  HGB 7.3* 7.8* 9.5*  HCT 23.3* 25.0* 30.1*  WBC 8.9 8.9 8.9  PLT 328 334 449*     Basename 12/27/10 0423 12/26/10 0445 12/24/10 1430  NA 130* 130* 133*  K 4.1 4.6 5.6*  BUN 12 14 23   CREATININE 0.84 0.89 0.93  GLUCOSE 99 103* 112*     Basename 12/24/10 1430  LABPT --  INR 1.36     Assessment/Plan: 2 Days Post-Op Procedure(s) (LRB): EXCISIONAL TOTAL KNEE ARTHROPLASTY (Right)   Advance diet Up with therapy Plan for discharge tomorrow Will recheck H&H tomorrow in the AM   Anastasio Auerbach. Gia Lusher   PAC  12/27/2010, 8:00 AM

## 2010-12-27 NOTE — Progress Notes (Addendum)
Physical Therapy Treatment Patient Details Name: [name] MRN: [id number] DOB: January 21, 1941 Today's Date: 12/27/2010 1125-1144;1TA PT Assessment/Plan  PT - Assessment/Plan Comments on Treatment Session: RN notified of pt being up in room alone PT Plan: Discharge plan remains appropriate;Frequency remains appropriate Equipment Recommended: None recommended by PT PT Goals  Acute Rehab PT Goals PT Goal: Supine/Side to Sit - Progress: Progressing toward goal PT Transfer Goal: Sit to Stand/Stand to Sit - Progress: Progressing toward goal PT Goal: Ambulate - Progress: Progressing toward goal  PT Treatment Precautions/Restrictions  Precautions Precautions: Knee Required Braces or Orthoses: Yes Knee Immobilizer: On at all times Restrictions Weight Bearing Restrictions: No RLE Weight Bearing: Weight bearing as tolerated Mobility (including Balance) Bed Mobility Bed Mobility: Yes Supine to Sit: 5: Supervision Supine to Sit Details (indicate cue type and reason): Vc's for technique Sit to Supine - Left: 5: Supervision Transfers Sit to Stand: 4: Min assist Sit to Stand Details (indicate cue type and reason): Min guard assist with VC's Stand to Sit: 4: Min assist;To chair/3-in-1 Stand to Sit Details: verbal cues for RW safety; pt up in room without assist Ambulation/Gait Ambulation/Gait: Yes Ambulation/Gait Assistance: 4: Min assist Ambulation/Gait Assistance Details (indicate cue type and reason): verbal cues for RW safety Ambulation Distance (Feet): 5 Feet Assistive device: Rolling walker    Exercise  Total Joint Exercises Ankle Circles/Pumps: AROM;AAROM;Right;Both Right LE [id number] AAROM Hip abd/adduction x 10 reps AAROM End of Session PT - End of Session Equipment Utilized During Treatment: Right knee immobilizer Activity Tolerance: Patient limited by fatigue Patient left: in bed General Behavior During Session: [facility] for tasks performed Cognition: [facility] for tasks  performed  [facility] 12/27/2010, 12:42 PM

## 2010-12-28 LAB — BODY FLUID CULTURE: Culture: NO GROWTH

## 2010-12-28 LAB — CBC
Hemoglobin: 8 g/dL — ABNORMAL LOW (ref 13.0–17.0)
MCH: 26.5 pg (ref 26.0–34.0)
RBC: 3.02 MIL/uL — ABNORMAL LOW (ref 4.22–5.81)
WBC: 8.1 10*3/uL (ref 4.0–10.5)

## 2010-12-28 MED ORDER — RIVAROXABAN 10 MG PO TABS: 10.0000 mg | ORAL_TABLET | ORAL | Status: DC

## 2010-12-28 MED ORDER — DSS 100 MG PO CAPS: 100.0000 mg | ORAL_CAPSULE | Freq: Two times a day (BID) | ORAL | Status: AC

## 2010-12-28 MED ORDER — CEFTRIAXONE SODIUM-DEXTROSE 2-2.22 GM-% IV SOLR
2.0000 g | INTRAVENOUS | Status: AC
Start: 2010-12-28 — End: 2011-02-01

## 2010-12-28 MED ORDER — FERROUS SULFATE 325 (65 FE) MG PO TABS: 325.0000 mg | ORAL_TABLET | Freq: Three times a day (TID) | ORAL | Status: DC

## 2010-12-28 MED ORDER — OXYCODONE HCL 5 MG PO TABS: 5.0000 mg | ORAL_TABLET | Freq: Four times a day (QID) | ORAL | Status: AC | PRN

## 2010-12-28 MED ORDER — POLYETHYLENE GLYCOL 3350 17 G PO PACK: 17.0000 g | PACK | Freq: Every day | ORAL | Status: AC

## 2010-12-28 MED ORDER — DIPHENHYDRAMINE HCL 25 MG PO CAPS: 25.0000 mg | ORAL_CAPSULE | Freq: Four times a day (QID) | ORAL | Status: DC | PRN

## 2010-12-28 MED ORDER — METHOCARBAMOL 500 MG PO TABS: 500.0000 mg | ORAL_TABLET | Freq: Four times a day (QID) | ORAL | Status: AC | PRN

## 2010-12-28 NOTE — Progress Notes (Signed)
Subjective: 3 Days Post-Op Procedure(s) (LRB): EXCISIONAL TOTAL KNEE ARTHROPLASTY (Right) with reimplantation placement of antibiotic spacer.  Patient reports pain as mild. Well controled with medication.  Objective:   VITALS:   Filed Vitals:   12/28/10 0600  BP: 116/56  Pulse: 65  Temp: 98.1 F (36.7 C)  Resp: 16    Neurovascular intact Dorsiflexion/Plantar flexion intact Incision: dressing C/D/I No cellulitis present Compartment soft  LABS  Basename 12/28/10 0445 12/27/10 0423 12/26/10 0445  HGB 8.0* 7.3* 7.8*  HCT 25.6* 23.3* 25.0*  WBC 8.1 8.9 8.9  PLT 345 328 334     Basename 12/27/10 0423 12/26/10 0445  NA 130* 130*  K 4.1 4.6  BUN 12 14  CREATININE 0.84 0.89  GLUCOSE 99 103*    Assessment/Plan: 3 Days Post-Op Procedure(s) (LRB): EXCISIONAL TOTAL KNEE ARTHROPLASTY (Right) with reimplantation placement of antibiotic spacer.  WB 25% Up with therapy Discharge home with home health   [name]. [name]   PAC  12/28/2010, 8:13 AM

## 2010-12-28 NOTE — Progress Notes (Signed)
During rounding, patient was found pulling at his TED hose vigorously. When asked what he was doing patient stated " I don't know." He later stated " I was having a dream and thought  I was catching the bus at the corner of [place]." Patient was reoriented to where he was and bed alarm was applied. Will continue to monitor patient.

## 2010-12-28 NOTE — Progress Notes (Signed)
Physical Therapy Treatment Patient Details Name: [name] MRN: [id number] DOB: [birth year] Today's Date: 12/28/2010 6962-9528 1G PT Assessment/Plan  PT - Assessment/Plan Comments on Treatment Session: pt feeling better after being nauseated this AM.  Pt is ready for discharge home with Homehealth PT Plan: Discharge plan remains appropriate Follow Up Recommendations: Home health PT Equipment Recommended: None recommended by PT PT Goals  Acute Rehab PT Goals PT Goal: Supine/Side to Sit - Progress: Progressing toward goal PT Transfer Goal: Sit to Stand/Stand to Sit - Progress: Progressing toward goal PT Goal: Ambulate - Progress: Progressing toward goal PT Goal: Up/Down Stairs - Progress: Progressing toward goal  PT Treatment Precautions/Restrictions  Precautions Precautions: Knee Precaution Comments: no ROM R knee Required Braces or Orthoses: Yes Knee Immobilizer: On at all times Restrictions Weight Bearing Restrictions: Yes RLE Weight Bearing: Touchdown weight bearing Other Position/Activity Restrictions: 25% WB on RLE Mobility (including Balance) Bed Mobility Bed Mobility: Yes Supine to Sit: 5: Supervision Sit to Supine - Left: 5: Supervision Transfers Sit to Stand: 5: Supervision;With upper extremity assist Stand to Sit: 5: Supervision;With upper extremity assist Ambulation/Gait Ambulation/Gait: Yes Ambulation/Gait Assistance:  (min guard) Ambulation Distance (Feet): 50 Feet Assistive device: Rolling walker Gait Pattern: Step-to pattern;Right circumduction Stairs: Yes Stairs Assistance: 4: Min assist Stairs Assistance Details (indicate cue type and reason): VC for proper lead with LLE up and Down with RLE Stair Management Technique: Two rails Number of Stairs: 2  Height of Stairs: 6     Exercise    End of Session PT - End of Session Equipment Utilized During Treatment: Right knee immobilizer Activity Tolerance: Patient tolerated treatment well Patient  left: in bed General Behavior During Session: [facility] for tasks performed Cognition: [facility] for tasks performed  Rada Hay 12/28/2010, 12:55 PM

## 2010-12-28 NOTE — Progress Notes (Signed)
Occupational Therapy Evaluation Patient Details Name: [name] MRN: [id number] DOB: 1940-05-20 Today's Date: 12/28/2010 SC1   0950-1002 OT Assessment/Plan OT Assessment/Plan OT Plan: Discharge plan remains appropriate OT Frequency: Min 2X/week Follow Up Recommendations: Home health OT Equipment Recommended: None recommended by OT OT Goals Acute Rehab OT Goals OT Goal Formulation: With patient Time For Goal Achievement: 7 days ADL Goals ADL Goal: Grooming - Progress: Progressing toward goals Additional ADL Goal #1: Pt will be Supervision for toileting tranfers/all aspects using RW and 3:1 over toilet ADL Goal: Additional Goal #1 - Progress: Progressing toward goals  OT Treatment Precautions/Restrictions  Precautions Precautions: Knee Required Braces or Orthoses: Yes Knee Immobilizer: On at all times Restrictions Weight Bearing Restrictions: Yes RLE Weight Bearing: Touchdown weight bearing Other Position/Activity Restrictions: 25% WB on RLE   ADL ADL Grooming: Performed;Wash/dry hands;Supervision/safety Where Assessed - Grooming: Standing at sink Toilet Transfer: Research scientist (life sciences) Method: Proofreader: Raised toilet seat with arms (or 3-in-1 over toilet) Toileting - Clothing Manipulation: Performed;Supervision/safety Where Assessed - Glass blower/designer Manipulation: Standing Toileting - Hygiene: Supervision/safety Where Assessed - Toileting Hygiene: Sit on 3-in-1 or toilet Mobility  Bed Mobility Bed Mobility: Yes Sit to Supine - Left: 5: Supervision Transfers Transfers: Yes Sit to Stand: 5: Supervision;With armrests;From chair/3-in-1 Stand to Sit: 5: Supervision;To bed Exercises    End of Session OT - End of Session Equipment Utilized During Treatment:  (rolling walker) Activity Tolerance: Patient tolerated treatment well (did c/o nausea.  deferred further activing post toileting) Patient left: in bed;with  call bell in reach General Behavior During Session: [facility] for tasks performed Cognition: [facility] for tasks performed  Hope Budds [phone] 12/28/2010, 10:09 AM

## 2010-12-28 NOTE — Progress Notes (Signed)
Orders received to continue [facility], PT and home IV antibiotics as ordered prior to this hospital stay. Pt is active with [facility] for hh services and home iv antibiotics. [facility] co-ordinator notified of resumption of orders. Colleen Can [facility] RN BSN CCM [phone] 12/28/2010

## 2010-12-28 NOTE — Discharge Summary (Signed)
Physician Discharge Summary  Patient ID: SHAW DOBEK MRN: [id number] DOB/AGE: 1940-03-22 70 y.o.  Admit date: 12/25/2010 Discharge date: 12/28/2010  Procedures:  Repeat sharp and excisional debridement of his right knee with removal of previous antibiotic spacer and reimplantation of right knee antibiotic spacer.  Attending Physician: Shelda Pal   Admission Diagnoses: Persistent infection following a right total knee replacement, status post I and D, and placement of antibiotic spacer with some persistent wound drainage.  Discharge Diagnoses:  S/P Repeat sharp and excisional debridement of his right knee with removal of previous antibiotic spacer and reimplantation of right knee antibiotic spacer. Patient Active Problem List  Diagnoses  . Coronary artery disease  . Hyperlipidemia  . Pre-operative cardiovascular examination, high risk surgery  . MELANOMA, SPREADING, SUPERFICIAL  . HYPERLIPIDEMIA  . ERECTILE DYSFUNCTION  . SLEEP APNEA, OBSTRUCTIVE, MODERATE  . OTHER IDIOPATHIC PERIPHERAL AUTONOMIC NEUROPATHY  . HYPERTENSION  . CORONARY ARTERY DISEASE  . CEREBRAL ATHEROSCLEROSIS  . PNEUMONIA  . PEPTIC ULCER DISEASE  . BENIGN PROSTATIC HYPERTROPHY, WITH URINARY OBSTRUCTION  . ACTINIC KERATOSIS  . KNEE PAIN, ACUTE  . SPINAL STENOSIS OF LUMBAR REGION  . LOW BACK PAIN  . CALF PAIN, LEFT  . HEADACHE  . SYMPTOM, NOCTURIA  . SPRAIN&STRAIN OF UNSPECIFIED SITE OF KNEE&LEG    Procedures:  Repeat sharp and excisional debridement of his right knee with removal of previous antibiotic spacer and reimplantation of right knee antibiotic spacer.  Discharged Condition: good  Hospital Course:  Patient underwent the above stated procedure on 12/25/2010. Patient tolerated the procedure well and brought to the recover room in good condition and subsequently to the floor.  POD #1 - 12/26/10 AF, VSS Neurovascular intact, Intact pulses distally, Dorsiflexion/Plantar flexion intact,  Incision: dressing C/D/I, No cellulitis present and Compartment soft. Wound looks good no signs of drainage. IV to saline lock.   LABS   Basename   12/26/10 0445    HGB   7.8*    HCT   25.0*               POD #2 - 12/27/10 AF, VSS Neurovascular intact, Intact pulses distally, Dorsiflexion/Plantar flexion intact, Incision: dressing C/D/I, No cellulitis present and Compartment soft. Wound looks good no signs of drainage. HV drain removed.    LABS   Basename  12/27/10 0423     HGB  7.3*     HCT  23.3*        POD #3 - 12/28/2010 AF, VSS Neurovascular intact, Dorsiflexion/Plantar flexion intact, Incision: dressing C/D/I, No cellulitis present and Compartment soft.  Wound looks good no signs of drainage.Felt to be doing well enough to be discharged home with home health.   LABS   Basename  12/28/10 0445     HGB  8.0*     HCT  25.6*       Disposition: Home with HHPT.  Discharge Orders    Future Appointments: Provider: Department: Dept Phone: Center:   04/01/2011 8:30 AM Jonny Ruiz Arletta [facility] [phone] [facility]     Future Orders Please Complete By Expires   Call MD / Call 911      Comments:   If you experience chest pain or shortness of breath, CALL 911 and be transported to the hospital emergency room.  If you develope a fever above 101 F, pus (white drainage) or increased drainage or redness at the wound, or calf pain, call your surgeon's office.   Constipation Prevention  Comments:   Drink plenty of fluids.  Prune juice may be helpful.  You may use a stool softener, such as Colace (over the counter) 100 mg twice a day.  Use MiraLax (over the counter) for constipation as needed.   Weight Bearing as taught in Physical Therapy      Comments:   Use a walker or crutches as instructed. 25% weight bearing on the right leg.   Discharge instructions      Comments:   Daily dressing changes with gauze and tape. Keep the area dry and clean until follow up. Follow  up in 1 week at [facility] for wound check.     Current Discharge Medication List    START taking these medications   Details  diphenhydrAMINE (BENADRYL) 25 mg capsule Take 1 capsule (25 mg total) by mouth every 6 (six) hours as needed for itching, allergies or sleep. Qty: 30 capsule    docusate sodium 100 MG CAPS Take 100 mg by mouth 2 (two) times daily. Qty: 10 capsule    ferrous sulfate 325 (65 FE) MG tablet Take 1 tablet (325 mg total) by mouth 3 (three) times daily after meals. Qty: 50 tablet, Refills: 0    oxyCODONE (OXY IR/ROXICODONE) 5 MG immediate release tablet Take 1 tablet (5 mg total) by mouth 4 (four) times daily as needed for pain. Qty: 40 tablet, Refills: 0    polyethylene glycol (MIRALAX / GLYCOLAX) packet Take 17 g by mouth daily. Qty: 14 each    rivaroxaban (XARELTO) 10 MG TABS tablet Take 1 tablet (10 mg total) by mouth daily. Qty: 12 tablet, Refills: 0      CONTINUE these medications which have CHANGED   Details  methocarbamol (ROBAXIN) 500 MG tablet Take 1 tablet (500 mg total) by mouth every 6 (six) hours as needed (muscle spasms). Qty: 50 tablet, Refills: 0      CONTINUE these medications which have NOT CHANGED   Details  aspirin 81 MG tablet Take 81 mg by mouth daily.     bisoprolol (ZEBETA) 5 MG tablet Take 2.5 mg by mouth daily.     gabapentin (NEURONTIN) 300 MG capsule Take 300 mg by mouth continuous as needed.     morphine (MS CONTIN) 30 MG 12 hr tablet Take 30 mg by mouth 2 (two) times daily.     Multiple Vitamins-Minerals (MULTIVITAMINS THER. W/MINERALS) TABS Take 1 tablet by mouth daily.     sertraline (ZOLOFT) 100 MG tablet Take 100 mg by mouth daily as needed. As needed for anxiety    simvastatin (ZOCOR) 40 MG tablet Take 1 tablet (40 mg total) by mouth at bedtime. Qty: 30 tablet, Refills: 6    amoxicillin (AMOXIL) 500 MG capsule Take 500 mg by mouth. 4 prior to dental work       STOP taking these medications      cefTRIAXone (ROCEPHIN) 2-2.22 GM-% IVPB      cefTRIAXone (ROCEPHIN) 40 MG/ML IVPB      ferrous gluconate (FERGON) 325 MG tablet          Signed: Anastasio Auerbach. Zenita Kister   PAC  12/28/2010, 9:31 AM

## 2010-12-30 ENCOUNTER — Encounter (HOSPITAL_COMMUNITY): Payer: Self-pay | Admitting: Orthopedic Surgery

## 2011-01-02 LAB — ANAEROBIC CULTURE

## 2011-01-07 ENCOUNTER — Telehealth: Payer: Self-pay | Admitting: Internal Medicine

## 2011-01-07 NOTE — Telephone Encounter (Signed)
Pt would like a referral to psychiatry for depression.

## 2011-01-07 NOTE — Telephone Encounter (Signed)
Pt informed to call insurance and find out who and they make their own appointment

## 2011-02-04 ENCOUNTER — Telehealth: Payer: Self-pay | Admitting: Family Medicine

## 2011-02-04 NOTE — Telephone Encounter (Signed)
Pt req lower dose of oxyCODONE (OXY IR/ROXICODONE) 5 MG immediate release tablet

## 2011-02-04 NOTE — Telephone Encounter (Signed)
Per dr Lovell Sheehan- pt can break in half and pt informed

## 2011-02-05 ENCOUNTER — Other Ambulatory Visit: Payer: Self-pay | Admitting: *Deleted

## 2011-02-05 MED ORDER — MORPHINE SULFATE CR 30 MG PO TB12: 30.0000 mg | ORAL_TABLET | Freq: Two times a day (BID) | ORAL | Status: DC

## 2011-02-06 LAB — AFB CULTURE WITH SMEAR (NOT AT ARMC): Acid Fast Smear: NONE SEEN

## 2011-03-06 ENCOUNTER — Telehealth: Payer: Self-pay | Admitting: Internal Medicine

## 2011-03-06 MED ORDER — MORPHINE SULFATE CR 30 MG PO TB12: 30.0000 mg | ORAL_TABLET | Freq: Two times a day (BID) | ORAL | Status: DC

## 2011-03-06 NOTE — Telephone Encounter (Signed)
Pt called req refill of morphine (MS CONTIN) 30 MG 12 hr tablet.     Also pt is having knee surgery on 03/25/11 and pt needs to know if he needs to come in prior to surgery for eval or not?

## 2011-03-06 NOTE — Telephone Encounter (Signed)
Printed

## 2011-03-11 ENCOUNTER — Encounter (HOSPITAL_COMMUNITY): Payer: Self-pay | Admitting: Pharmacy Technician

## 2011-03-20 ENCOUNTER — Encounter (HOSPITAL_COMMUNITY)
Admission: RE | Admit: 2011-03-20 | Discharge: 2011-03-20 | Disposition: A | Discharge status: Home / Self Care | Payer: Medicare Other | Source: Ambulatory Visit | Attending: Orthopedic Surgery | Admitting: Orthopedic Surgery

## 2011-03-20 ENCOUNTER — Encounter (HOSPITAL_COMMUNITY): Payer: Self-pay

## 2011-03-20 ENCOUNTER — Telehealth: Payer: Self-pay | Admitting: Internal Medicine

## 2011-03-20 LAB — DIFFERENTIAL
Basophils Relative: 0 % (ref 0–1)
Monocytes Relative: 9 % (ref 3–12)
Neutro Abs: 8.1 10*3/uL — ABNORMAL HIGH (ref 1.7–7.7)
Neutrophils Relative %: 77 % (ref 43–77)

## 2011-03-20 LAB — BASIC METABOLIC PANEL
BUN: 15 mg/dL (ref 6–23)
GFR calc Af Amer: >90 mL/min (ref >90)
GFR calc non Af Amer: 80 mL/min — ABNORMAL LOW (ref >90)
Potassium: 4.1 mEq/L (ref 3.5–5.1)
Sodium: 138 mEq/L (ref 135–145)

## 2011-03-20 LAB — SURGICAL PCR SCREEN
MRSA, PCR: NEGATIVE
Staphylococcus aureus: NEGATIVE

## 2011-03-20 LAB — URINALYSIS, ROUTINE W REFLEX MICROSCOPIC
Leukocytes, UA: NEGATIVE
Nitrite: NEGATIVE
Specific Gravity, Urine: 1.016 (ref 1.005–1.030)
pH: 7.5 (ref 5.0–8.0)

## 2011-03-20 LAB — APTT: aPTT: 47 seconds — ABNORMAL HIGH (ref 24–37)

## 2011-03-20 LAB — CBC
Hemoglobin: 10.7 g/dL — ABNORMAL LOW (ref 13.0–17.0)
MCHC: 32.2 g/dL (ref 30.0–36.0)
RBC: 4.01 MIL/uL — ABNORMAL LOW (ref 4.22–5.81)

## 2011-03-20 LAB — PROTIME-INR: INR: 1.32 (ref 0.00–1.49)

## 2011-03-20 LAB — URINE MICROSCOPIC-ADD ON

## 2011-03-20 MED ORDER — CHLORHEXIDINE GLUCONATE 4 % EX LIQD
60.0000 mL | Freq: Once | CUTANEOUS | Status: DC
  Filled 2011-03-20: qty 60

## 2011-03-20 NOTE — Pre-Procedure Instructions (Signed)
PST APPT- Chest x ray 10/12 EPIC, Last stress test 2011 per Dr Juanda Chance.  States has had "GI BUG" x 2 weeks off and on with headache, nausea and diarrhea, temp to 101. States taking alka seltzer cold and is OK today. States has appt at Dr Boneta Lucks today at 2pm and to notify him of this. Verbalizes understanding

## 2011-03-20 NOTE — Telephone Encounter (Signed)
Patient is scheduled to right knee surgery on next Monday. She stated that she faxed over twice a surgery clearance form. Checking on the status. Please return her call. Thanks.

## 2011-03-20 NOTE — Telephone Encounter (Signed)
Left message on machine This was already faxed on [phone] -see copy in chart- if they dont have it let us know

## 2011-03-20 NOTE — Patient Instructions (Signed)
20 [name]  03/20/2011   Your procedure is scheduled on: 03/25/11  Surgery  4098-1191  Monday Report to [facility] at 0815 AM.  Call this number if you have problems the morning of surgery: [phone]   [facility] PST [phone]   Remember:   Do not eat food:After Midnight. Sunday NIGHT  May have clear liquids:until Midnight .SUNDAY NIGHT  Take these medicines the morning of surgery with A SIP OF WATER:  Zebeta, ms contin with sip water            May take robaxin, percocet if needed with sip water   Do not wear jewelry, make-up or nail polish.  Do not wear lotions, powders, or perfumes. You may wear deodorant.  Do not shave 48 hours prior to surgery.  Do not bring valuables to the hospital.  Contacts, dentures or bridgework may not be worn into surgery.  Leave suitcase in the car. After surgery it may be brought to your room.  For patients admitted to the hospital, checkout time is 11:00 AM the day of discharge.   Patients discharged the day of surgery will not be allowed to drive home.  Name and phone number of your driver:wife Special Instructions: CHG Shower Use Special Wash: 1/2 bottle night before surgery and 1/2 bottle morning of surgery. Regular soap face and privates   Please read over the following fact sheets that you were given: MRSA Information

## 2011-03-20 NOTE — Pre-Procedure Instructions (Signed)
lov and ekg from 01/30/11 dR gANJI PLACED ON CHART. fAXED REQUEST TO dR oLIN WITH CONFIRMATION TO REVIEW ABNORMAL COAGS, DIFF AND URINE

## 2011-03-24 NOTE — H&P (Signed)
[name] is an 71 y.o. male.    Chief Complaint: Right knee infection / inflammation reaction to joint prothesis  HPI: Pt is a 71 y.o. male with a history of a right total knee arthroplasty and subsequent infection.  On November 20, 2010, he underwent a removal of the implant with an I&D and implantation of a antibiotic spacer.  He was also on weeks of antibiotics as well to help treat the infection.  Pt states that since the last surgery he has continually felt better, and though he has limited ROM he feels that the pain has been decreasing. Recent lab work shows an elevated CRP (22.21) and Sed rate (94). While in the office there was an attempt to aspirate the right knee, which resulted in no aspirate to evaluate. Various options are discussed with the patient. Risks, benefits and expectations were discussed with the patient, including the possibility if the knee is still infected that he may need another spacer and more time on antibiotic. Patient understand the risks, benefits and expectations and wishes to proceed with surgery.   PCP:  Carrie Mew, MD, MD  D/C Plans:  Home with HHPT  Post-op Meds:  Rx given for ASA, Robaxin, Celebrex, Iron, Colace and MiraLax  Tranexamic Acid:  Not to be given  PMH: Past Medical History  Diagnosis Date  . Hypertension   . Hyperlipidemia   . Preoperative cardiovascular examination   . Spinal stenosis, lumbar   . CAS (cerebral atherosclerosis)   . Erectile dysfunction   . Preventative health care   . Benign prostatic hypertrophy with urinary obstruction   . Family history of early CAD     male 1st degree relative <50  . Peptic ulcer disease   . Low back pain   . Superficial spreading melanoma   . Sleep apnea, obstructive     CPAP-does not use  . HA (headache)     sinus headaches  . Arthritis     knees  . Myocardial infarction 1997    Hx MI 1997 and 1998  . Anxiety   . Pneumonia 2005  . Anemia   . Depression     takes Zoloft    . Neuropathy, autonomic, idiopathic peripheral, other   . Blood transfusion 1 yr. ago    jerking,fever-after blood transfusion  . Blood transfusion     given wrong blood  . Coronary artery disease 1998    cardiac stent/ Clearance Dr Lovell Sheehan and Jacinto Halim with note on chart    PSH: Past Surgical History  Procedure Date  . Esophagogastroduodenoscopy 2004  . Colonoscopy 2004  . Lumbar laminectomy   . Lumbar fusion   . Knee arthroscopy     left  . Shoulder arthroscopy     right  . Knee arthroscopy     right  . Decompression of the median nerve     left wrist and hand  . Revision of tkr 2011    on left  . Coronary stent placement   . Back surgery 2005-last    lumbar x2  . Excisional total knee arthroplasty 12/25/2010    Procedure: EXCISIONAL TOTAL KNEE ARTHROPLASTY;  Surgeon: Shelda Pal;  Location: [facility];  Service: Orthopedics;  Laterality: Right;  repeat irrigation   and debridement, removal and reinsertation of spacer block  . Coronary angioplasty 1998  . Joint replacement 2011    left knee x 2 ; 08/2010-right knee-infected    Social History:  reports that he quit smoking  about 15 months ago. His smoking use included Cigars. He does not have any smokeless tobacco history on file. He reports that he does not drink alcohol or use illicit drugs.  Allergies:  Allergies  Allergen Reactions  . Acetaminophen     REACTION:LIVER INFLAMMATION IN HIGH DOSES    Medications: No current facility-administered medications for this encounter.   Current Outpatient Prescriptions  Medication Sig Dispense Refill  . aspirin 81 MG tablet Take 81 mg by mouth at bedtime.       Marland Kitchen aspirin 81 MG tablet Take 160 mg by mouth at bedtime.      . bisoprolol (ZEBETA) 5 MG tablet Take 2.5 mg by mouth daily after breakfast.       . gabapentin (NEURONTIN) 300 MG capsule Take 300 mg by mouth 3 (three) times daily as needed. Pain       . methocarbamol (ROBAXIN) 500 MG tablet Take 500 mg by mouth 3  (three) times daily as needed. Muscle spasm       . morphine (MS CONTIN) 30 MG 12 hr tablet Take 1 tablet (30 mg total) by mouth 2 (two) times daily.  60 tablet  0  . Multiple Vitamins-Minerals (MULTIVITAMINS THER. W/MINERALS) TABS Take 1 tablet by mouth daily.       Marland Kitchen oxyCODONE-acetaminophen (PERCOCET) 5-325 MG per tablet Take 1 tablet by mouth every 6 (six) hours as needed.      Marland Kitchen Phenyleph-CPM-DM-APAP (ALKA-SELTZER PLUS COLD & COUGH) 06-19-08-325 MG CAPS Take 1 capsule by mouth every 4 (four) hours as needed. Congestion       . simvastatin (ZOCOR) 40 MG tablet Take 1 tablet (40 mg total) by mouth at bedtime.  30 tablet  6    ROS: Review of Systems  Constitutional: Negative.   HENT: Negative.   Eyes: Negative.   Respiratory: Negative.   Cardiovascular: Negative.   Gastrointestinal: Negative.   Genitourinary: Negative.   Musculoskeletal: Positive for joint pain.  Skin: Negative.   Neurological: Negative.   Endo/Heme/Allergies: Negative.   Psychiatric/Behavioral: Negative.      Physican Exam: BP: 141/70 Physical Exam  Constitutional: He is oriented to person, place, and time and well-developed, well-nourished, and in no distress.  HENT:  Head: Normocephalic and atraumatic.  Nose: Nose normal.  Mouth/Throat: Oropharynx is clear and moist.  Eyes: Pupils are equal, round, and reactive to light.  Neck: Neck supple. No JVD present. No tracheal deviation present. No thyromegaly present.  Cardiovascular: Normal rate, regular rhythm, normal heart sounds and intact distal pulses.   Pulmonary/Chest: Effort normal and breath sounds normal. No stridor.  Abdominal: Soft. There is no tenderness. There is no guarding.  Musculoskeletal:       Right knee: He exhibits decreased range of motion, swelling, effusion, ecchymosis, deformity and laceration (healed). He exhibits no erythema. no tenderness found.  Lymphadenopathy:    He has no cervical adenopathy.  Neurological: He is alert and  oriented to person, place, and time.  Skin: Skin is warm and dry.  Psychiatric: Affect normal.      Assessment/Plan Assessment: Right knee infection / inflammation reaction to joint prothesis  Plan: Patient will undergo a right total knee revision with removal of non-biodegradable drug implant vs I&D with implantation of another antibiotic spacer on 03/25/2011. Risks benefits and expectation were discussed with the patient. Patient understand risks, benefits and expectation and wishes to proceed.   Anastasio Auerbach Shereda Graw   PAC  03/24/2011, 3:13 PM

## 2011-03-25 ENCOUNTER — Other Ambulatory Visit: Payer: Self-pay

## 2011-03-25 ENCOUNTER — Encounter (HOSPITAL_COMMUNITY): Payer: Self-pay | Admitting: Anesthesiology

## 2011-03-25 ENCOUNTER — Encounter (HOSPITAL_COMMUNITY): Payer: Self-pay | Admitting: *Deleted

## 2011-03-25 ENCOUNTER — Inpatient Hospital Stay (HOSPITAL_COMMUNITY)
Admission: RE | Admit: 2011-03-25 | Discharge: 2011-03-27 | DRG: 468 | Disposition: A | Discharge status: Home Health | Payer: Medicare Other | Source: Ambulatory Visit | Attending: Orthopedic Surgery | Admitting: Orthopedic Surgery

## 2011-03-25 ENCOUNTER — Encounter (HOSPITAL_COMMUNITY)
Admission: RE | Disposition: A | Discharge status: Home Health | Payer: Self-pay | Source: Ambulatory Visit | Attending: Orthopedic Surgery

## 2011-03-25 ENCOUNTER — Inpatient Hospital Stay (HOSPITAL_COMMUNITY): Payer: Medicare Other | Admitting: Anesthesiology

## 2011-03-25 DIAGNOSIS — N138 Other obstructive and reflux uropathy: Secondary | ICD-10-CM | POA: Diagnosis present

## 2011-03-25 DIAGNOSIS — F3289 Other specified depressive episodes: Secondary | ICD-10-CM | POA: Diagnosis present

## 2011-03-25 DIAGNOSIS — E785 Hyperlipidemia, unspecified: Secondary | ICD-10-CM | POA: Diagnosis present

## 2011-03-25 DIAGNOSIS — Z87891 Personal history of nicotine dependence: Secondary | ICD-10-CM

## 2011-03-25 DIAGNOSIS — G4730 Sleep apnea, unspecified: Secondary | ICD-10-CM | POA: Diagnosis present

## 2011-03-25 DIAGNOSIS — Z01812 Encounter for preprocedural laboratory examination: Secondary | ICD-10-CM

## 2011-03-25 DIAGNOSIS — Z96659 Presence of unspecified artificial knee joint: Secondary | ICD-10-CM

## 2011-03-25 DIAGNOSIS — I2510 Atherosclerotic heart disease of native coronary artery without angina pectoris: Secondary | ICD-10-CM | POA: Diagnosis present

## 2011-03-25 DIAGNOSIS — Z89529 Acquired absence of unspecified knee: Secondary | ICD-10-CM

## 2011-03-25 DIAGNOSIS — Z01818 Encounter for other preprocedural examination: Secondary | ICD-10-CM

## 2011-03-25 DIAGNOSIS — Z8711 Personal history of peptic ulcer disease: Secondary | ICD-10-CM

## 2011-03-25 DIAGNOSIS — G609 Hereditary and idiopathic neuropathy, unspecified: Secondary | ICD-10-CM | POA: Diagnosis present

## 2011-03-25 DIAGNOSIS — M129 Arthropathy, unspecified: Secondary | ICD-10-CM | POA: Diagnosis present

## 2011-03-25 DIAGNOSIS — I252 Old myocardial infarction: Secondary | ICD-10-CM

## 2011-03-25 DIAGNOSIS — N401 Enlarged prostate with lower urinary tract symptoms: Secondary | ICD-10-CM | POA: Diagnosis present

## 2011-03-25 DIAGNOSIS — F329 Major depressive disorder, single episode, unspecified: Secondary | ICD-10-CM | POA: Diagnosis present

## 2011-03-25 DIAGNOSIS — I10 Essential (primary) hypertension: Secondary | ICD-10-CM | POA: Diagnosis present

## 2011-03-25 DIAGNOSIS — T8459XA Infection and inflammatory reaction due to other internal joint prosthesis, initial encounter: Secondary | ICD-10-CM

## 2011-03-25 DIAGNOSIS — F411 Generalized anxiety disorder: Secondary | ICD-10-CM | POA: Diagnosis present

## 2011-03-25 DIAGNOSIS — Z4789 Encounter for other orthopedic aftercare: Principal | ICD-10-CM

## 2011-03-25 HISTORY — PX: I & D EXTREMITY: SHX5045

## 2011-03-25 LAB — BASIC METABOLIC PANEL
Calcium: 8.9 mg/dL (ref 8.4–10.5)
GFR calc non Af Amer: 80 mL/min — ABNORMAL LOW (ref >90)
Glucose, Bld: 119 mg/dL — ABNORMAL HIGH (ref 70–99)
Sodium: 136 mEq/L (ref 135–145)

## 2011-03-25 LAB — CARDIAC PANEL(CRET KIN+CKTOT+MB+TROPI)
CK, MB: 5.7 ng/mL — ABNORMAL HIGH (ref 0.3–4.0)
Relative Index: INVALID (ref 0.0–2.5)
Total CK: 76 U/L (ref 7–232)
Troponin I: <0.3 ng/mL (ref <0.30)

## 2011-03-25 LAB — GRAM STAIN

## 2011-03-25 SURGERY — REMOVAL, SPACER, KNEE
Anesthesia: General | Site: Knee | Laterality: Right | Wound class: Dirty or Infected

## 2011-03-25 MED ORDER — FENTANYL CITRATE 0.05 MG/ML IJ SOLN
INTRAMUSCULAR | Status: DC | PRN
  Administered 2011-03-25: 75 ug via INTRAVENOUS
  Administered 2011-03-25: 25 ug via INTRAVENOUS
  Administered 2011-03-25 (×3): 50 ug via INTRAVENOUS

## 2011-03-25 MED ORDER — METOCLOPRAMIDE HCL 10 MG PO TABS: 5.0000 mg | ORAL_TABLET | Freq: Three times a day (TID) | ORAL | Status: DC | PRN

## 2011-03-25 MED ORDER — OXYCODONE HCL 5 MG PO TABS
10.0000 mg | ORAL_TABLET | ORAL | Status: DC
  Administered 2011-03-25: 20 mg via ORAL
  Administered 2011-03-25 (×2): 10 mg via ORAL
  Administered 2011-03-26 (×2): 20 mg via ORAL
  Administered 2011-03-26: 10 mg via ORAL
  Administered 2011-03-26: 20 mg via ORAL
  Administered 2011-03-26: 10 mg via ORAL
  Administered 2011-03-26: 20 mg via ORAL
  Administered 2011-03-27 (×4): 10 mg via ORAL
  Filled 2011-03-25 (×5): qty 2
  Filled 2011-03-25 (×2): qty 4
  Filled 2011-03-25: qty 2
  Filled 2011-03-25: qty 4
  Filled 2011-03-25 (×2): qty 2
  Filled 2011-03-25 (×2): qty 4

## 2011-03-25 MED ORDER — METHOCARBAMOL 100 MG/ML IJ SOLN
500.0000 mg | Freq: Four times a day (QID) | INTRAVENOUS | Status: DC | PRN
  Administered 2011-03-25: 500 mg via INTRAVENOUS
  Filled 2011-03-25: qty 5

## 2011-03-25 MED ORDER — PHENOL 1.4 % MT LIQD: 1.0000 | OROMUCOSAL | Status: DC | PRN

## 2011-03-25 MED ORDER — ZOLPIDEM TARTRATE 5 MG PO TABS
5.0000 mg | ORAL_TABLET | Freq: Every evening | ORAL | Status: DC | PRN
  Administered 2011-03-26 (×2): 5 mg via ORAL
  Filled 2011-03-25 (×2): qty 1

## 2011-03-25 MED ORDER — EPHEDRINE SULFATE 50 MG/ML IJ SOLN
INTRAMUSCULAR | Status: DC | PRN
  Administered 2011-03-25: 10 mg via INTRAVENOUS

## 2011-03-25 MED ORDER — RIVAROXABAN 10 MG PO TABS
10.0000 mg | ORAL_TABLET | ORAL | Status: DC
  Administered 2011-03-26 – 2011-03-27 (×2): 10 mg via ORAL
  Filled 2011-03-25 (×3): qty 1

## 2011-03-25 MED ORDER — FERROUS SULFATE 325 (65 FE) MG PO TABS
325.0000 mg | ORAL_TABLET | Freq: Three times a day (TID) | ORAL | Status: DC
  Administered 2011-03-25 – 2011-03-27 (×5): 325 mg via ORAL
  Filled 2011-03-25 (×7): qty 1

## 2011-03-25 MED ORDER — CEFAZOLIN SODIUM-DEXTROSE 2-3 GM-% IV SOLR
2.0000 g | Freq: Once | INTRAVENOUS | Status: AC
  Administered 2011-03-25: 2 g via INTRAVENOUS

## 2011-03-25 MED ORDER — HYDROMORPHONE HCL PF 1 MG/ML IJ SOLN
INTRAMUSCULAR | Status: AC
  Filled 2011-03-25: qty 1

## 2011-03-25 MED ORDER — DIPHENHYDRAMINE HCL 25 MG PO CAPS: 25.0000 mg | ORAL_CAPSULE | Freq: Four times a day (QID) | ORAL | Status: DC | PRN

## 2011-03-25 MED ORDER — GABAPENTIN 300 MG PO CAPS
300.0000 mg | ORAL_CAPSULE | Freq: Three times a day (TID) | ORAL | Status: DC | PRN
Start: 2011-03-25 — End: 2011-03-27
  Filled 2011-03-25: qty 1

## 2011-03-25 MED ORDER — SIMVASTATIN 40 MG PO TABS
40.0000 mg | ORAL_TABLET | Freq: Every day | ORAL | Status: DC
  Administered 2011-03-25 – 2011-03-26 (×2): 40 mg via ORAL
  Filled 2011-03-25 (×3): qty 1

## 2011-03-25 MED ORDER — HYDROMORPHONE HCL PF 1 MG/ML IJ SOLN
0.5000 mg | INTRAMUSCULAR | Status: DC | PRN
  Administered 2011-03-25: 0.5 mg via INTRAVENOUS
  Administered 2011-03-26: 1 mg via INTRAVENOUS
  Administered 2011-03-26: 2 mg via INTRAVENOUS
  Administered 2011-03-26: 1 mg via INTRAVENOUS
  Filled 2011-03-25: qty 2
  Filled 2011-03-25 (×3): qty 1

## 2011-03-25 MED ORDER — METOCLOPRAMIDE HCL 5 MG/ML IJ SOLN: 5.0000 mg | Freq: Three times a day (TID) | INTRAMUSCULAR | Status: DC | PRN

## 2011-03-25 MED ORDER — HYDROMORPHONE HCL PF 1 MG/ML IJ SOLN
INTRAMUSCULAR | Status: DC | PRN
  Administered 2011-03-25 (×4): 0.5 mg via INTRAVENOUS

## 2011-03-25 MED ORDER — SODIUM CHLORIDE 0.9 % IR SOLN
Status: DC | PRN
  Administered 2011-03-25: 1000 mL

## 2011-03-25 MED ORDER — MEPERIDINE HCL 50 MG/ML IJ SOLN: 6.2500 mg | INTRAMUSCULAR | Status: DC | PRN

## 2011-03-25 MED ORDER — VANCOMYCIN HCL 1000 MG IV SOLR
INTRAVENOUS | Status: AC
  Filled 2011-03-25: qty 3000

## 2011-03-25 MED ORDER — ROCURONIUM BROMIDE 100 MG/10ML IV SOLN
INTRAVENOUS | Status: DC | PRN
  Administered 2011-03-25: 30 mg via INTRAVENOUS

## 2011-03-25 MED ORDER — MORPHINE SULFATE CR 30 MG PO TB12
30.0000 mg | ORAL_TABLET | Freq: Two times a day (BID) | ORAL | Status: DC
  Administered 2011-03-25 – 2011-03-27 (×4): 30 mg via ORAL
  Filled 2011-03-25 (×4): qty 1

## 2011-03-25 MED ORDER — VANCOMYCIN HCL IN DEXTROSE 1-5 GM/200ML-% IV SOLN
INTRAVENOUS | Status: AC
  Filled 2011-03-25: qty 200

## 2011-03-25 MED ORDER — METHOCARBAMOL 500 MG PO TABS
500.0000 mg | ORAL_TABLET | Freq: Four times a day (QID) | ORAL | Status: DC | PRN
  Administered 2011-03-25 – 2011-03-26 (×2): 500 mg via ORAL
  Filled 2011-03-25 (×2): qty 1

## 2011-03-25 MED ORDER — MIDAZOLAM HCL 5 MG/5ML IJ SOLN
INTRAMUSCULAR | Status: DC | PRN
  Administered 2011-03-25: 1 mg via INTRAVENOUS

## 2011-03-25 MED ORDER — LACTATED RINGERS IV SOLN: INTRAVENOUS | Status: DC

## 2011-03-25 MED ORDER — MENTHOL 3 MG MT LOZG: 1.0000 | LOZENGE | OROMUCOSAL | Status: DC | PRN

## 2011-03-25 MED ORDER — ONDANSETRON HCL 4 MG PO TABS: 4.0000 mg | ORAL_TABLET | Freq: Four times a day (QID) | ORAL | Status: DC | PRN

## 2011-03-25 MED ORDER — VANCOMYCIN HCL IN DEXTROSE 1-5 GM/200ML-% IV SOLN
1000.0000 mg | Freq: Two times a day (BID) | INTRAVENOUS | Status: DC
  Filled 2011-03-25: qty 200

## 2011-03-25 MED ORDER — TOBRAMYCIN SULFATE 1.2 G IJ SOLR
INTRAMUSCULAR | Status: DC | PRN
  Administered 2011-03-25: 2.4 g

## 2011-03-25 MED ORDER — PROPOFOL 10 MG/ML IV EMUL
INTRAVENOUS | Status: DC | PRN
  Administered 2011-03-25: 140 mg via INTRAVENOUS

## 2011-03-25 MED ORDER — PROMETHAZINE HCL 25 MG/ML IJ SOLN: 6.2500 mg | INTRAMUSCULAR | Status: DC | PRN

## 2011-03-25 MED ORDER — LACTATED RINGERS IV SOLN
INTRAVENOUS | Status: DC
  Administered 2011-03-25: 1000 mL via INTRAVENOUS

## 2011-03-25 MED ORDER — BISOPROLOL FUMARATE 5 MG PO TABS
2.5000 mg | ORAL_TABLET | Freq: Every day | ORAL | Status: DC
  Administered 2011-03-26 – 2011-03-27 (×2): 2.5 mg via ORAL
  Filled 2011-03-25 (×2): qty 0.5

## 2011-03-25 MED ORDER — TOBRAMYCIN SULFATE 1.2 G IJ SOLR
INTRAMUSCULAR | Status: AC
  Filled 2011-03-25: qty 3.6

## 2011-03-25 MED ORDER — SODIUM CHLORIDE 0.9 % IV SOLN
INTRAVENOUS | Status: DC
  Administered 2011-03-25: 21:00:00 via INTRAVENOUS
  Filled 2011-03-25 (×7): qty 1000

## 2011-03-25 MED ORDER — GLYCOPYRROLATE 0.2 MG/ML IJ SOLN
INTRAMUSCULAR | Status: DC | PRN
  Administered 2011-03-25: .6 mg via INTRAVENOUS

## 2011-03-25 MED ORDER — VANCOMYCIN HCL 1000 MG IV SOLR
1000.0000 mg | INTRAVENOUS | Status: DC | PRN
  Administered 2011-03-25: 1000 mg via INTRAVENOUS

## 2011-03-25 MED ORDER — BISACODYL 5 MG PO TBEC: 5.0000 mg | DELAYED_RELEASE_TABLET | Freq: Every day | ORAL | Status: DC | PRN

## 2011-03-25 MED ORDER — ALUM & MAG HYDROXIDE-SIMETH 200-200-20 MG/5ML PO SUSP
30.0000 mL | ORAL | Status: DC | PRN
  Administered 2011-03-25 – 2011-03-26 (×2): 30 mL via ORAL
  Filled 2011-03-25 (×3): qty 30

## 2011-03-25 MED ORDER — VANCOMYCIN HCL IN DEXTROSE 1-5 GM/200ML-% IV SOLN
1000.0000 mg | Freq: Once | INTRAVENOUS | Status: AC
  Administered 2011-03-26: 1000 mg via INTRAVENOUS
  Filled 2011-03-25: qty 200

## 2011-03-25 MED ORDER — ONDANSETRON HCL 4 MG/2ML IJ SOLN: 4.0000 mg | Freq: Four times a day (QID) | INTRAMUSCULAR | Status: DC | PRN

## 2011-03-25 MED ORDER — VANCOMYCIN HCL 1000 MG IV SOLR
INTRAVENOUS | Status: DC | PRN
  Administered 2011-03-25: 2 g
  Administered 2011-03-25: 4 g

## 2011-03-25 MED ORDER — LIDOCAINE HCL (CARDIAC) 20 MG/ML IV SOLN
INTRAVENOUS | Status: DC | PRN
  Administered 2011-03-25: 80 mg via INTRAVENOUS

## 2011-03-25 MED ORDER — ONDANSETRON HCL 4 MG/2ML IJ SOLN
INTRAMUSCULAR | Status: DC | PRN
  Administered 2011-03-25: 4 mg via INTRAVENOUS

## 2011-03-25 MED ORDER — ALPRAZOLAM 0.5 MG PO TABS
0.5000 mg | ORAL_TABLET | Freq: Three times a day (TID) | ORAL | Status: DC | PRN
  Administered 2011-03-25 – 2011-03-27 (×4): 0.5 mg via ORAL
  Filled 2011-03-25 (×4): qty 1

## 2011-03-25 MED ORDER — NEOSTIGMINE METHYLSULFATE 1 MG/ML IJ SOLN
INTRAMUSCULAR | Status: DC | PRN
  Administered 2011-03-25: 4 mg via INTRAVENOUS

## 2011-03-25 MED ORDER — HYDROMORPHONE HCL PF 1 MG/ML IJ SOLN
0.2500 mg | INTRAMUSCULAR | Status: DC | PRN
  Administered 2011-03-25 (×5): 0.5 mg via INTRAVENOUS

## 2011-03-25 MED ORDER — SODIUM CHLORIDE 0.9 % IJ SOLN
10.0000 mL | INTRAMUSCULAR | Status: DC | PRN
  Administered 2011-03-27 (×2): 10 mL

## 2011-03-25 MED ORDER — DOCUSATE SODIUM 100 MG PO CAPS
100.0000 mg | ORAL_CAPSULE | Freq: Two times a day (BID) | ORAL | Status: DC
  Administered 2011-03-26 – 2011-03-27 (×3): 100 mg via ORAL
  Filled 2011-03-25 (×5): qty 1

## 2011-03-25 MED ORDER — FLEET ENEMA 7-19 GM/118ML RE ENEM: 1.0000 | ENEMA | Freq: Once | RECTAL | Status: AC | PRN

## 2011-03-25 MED ORDER — POLYETHYLENE GLYCOL 3350 17 G PO PACK
17.0000 g | PACK | Freq: Two times a day (BID) | ORAL | Status: DC
  Administered 2011-03-25 – 2011-03-26 (×2): 17 g via ORAL
  Filled 2011-03-25 (×5): qty 1

## 2011-03-25 SURGICAL SUPPLY — 64 items
BAG SPEC THK2 15X12 ZIP CLS (MISCELLANEOUS) ×1
BAG ZIPLOCK 12X15 (MISCELLANEOUS) ×2 IMPLANT
BANDAGE ELASTIC 6 VELCRO ST LF (GAUZE/BANDAGES/DRESSINGS) ×2 IMPLANT
BANDAGE ESMARK 6X9 LF (GAUZE/BANDAGES/DRESSINGS) ×1 IMPLANT
BLADE SAW SGTL 13.0X1.19X90.0M (BLADE) ×2 IMPLANT
BLADE SAW SGTL 81X20 HD (BLADE) ×2 IMPLANT
BNDG CMPR 9X6 STRL LF SNTH (GAUZE/BANDAGES/DRESSINGS) ×1
BNDG ESMARK 6X9 LF (GAUZE/BANDAGES/DRESSINGS) ×2
BONE CEMENT GENTAMICIN (Cement) ×7 IMPLANT
BRUSH FEMORAL CANAL (MISCELLANEOUS) ×1 IMPLANT
CLOTH BEACON ORANGE TIMEOUT ST (SAFETY) ×2 IMPLANT
CUFF TOURN SGL QUICK 34 (TOURNIQUET CUFF) ×2
CUFF TRNQT CYL 34X4X40X1 (TOURNIQUET CUFF) ×1 IMPLANT
DRAPE EXTREMITY T 121X128X90 (DRAPE) ×2 IMPLANT
DRAPE POUCH INSTRU U-SHP 10X18 (DRAPES) ×2 IMPLANT
DRAPE U-SHAPE 47X51 STRL (DRAPES) ×2 IMPLANT
DRSG ADAPTIC 3X8 NADH LF (GAUZE/BANDAGES/DRESSINGS) ×2 IMPLANT
DRSG EMULSION OIL 3X16 NADH (GAUZE/BANDAGES/DRESSINGS) ×1 IMPLANT
DRSG PAD ABDOMINAL 8X10 ST (GAUZE/BANDAGES/DRESSINGS) ×2 IMPLANT
DURAPREP 26ML APPLICATOR (WOUND CARE) ×2 IMPLANT
ELECT REM PT RETURN 9FT ADLT (ELECTROSURGICAL) ×2
ELECTRODE REM PT RTRN 9FT ADLT (ELECTROSURGICAL) ×1 IMPLANT
EVACUATOR 1/8 PVC DRAIN (DRAIN) ×2 IMPLANT
FACESHIELD LNG OPTICON STERILE (SAFETY) ×11 IMPLANT
GAUZE SPONGE 4X4 12PLY STRL LF (GAUZE/BANDAGES/DRESSINGS) ×1 IMPLANT
GLOVE BIOGEL PI IND STRL 7.5 (GLOVE) ×1 IMPLANT
GLOVE BIOGEL PI IND STRL 8 (GLOVE) ×2 IMPLANT
GLOVE BIOGEL PI INDICATOR 7.5 (GLOVE) ×1
GLOVE BIOGEL PI INDICATOR 8 (GLOVE) ×2
GLOVE ORTHO TXT STRL SZ7.5 (GLOVE) ×5 IMPLANT
GLOVE SURG ORTHO 8.0 STRL STRW (GLOVE) ×2 IMPLANT
GOWN BRE IMP PREV XXLGXLNG (GOWN DISPOSABLE) ×2 IMPLANT
GOWN STRL NON-REIN LRG LVL3 (GOWN DISPOSABLE) ×2 IMPLANT
HANDPIECE INTERPULSE COAX TIP (DISPOSABLE) ×2
IMMOBILIZER KNEE 20 (SOFTGOODS) ×2
IMMOBILIZER KNEE 20 THIGH 36 (SOFTGOODS) IMPLANT
KIT BASIN OR (CUSTOM PROCEDURE TRAY) ×2 IMPLANT
KIT STIMULAN RAPID CURE  10CC (Orthopedic Implant) ×2 IMPLANT
KIT STIMULAN RAPID CURE 10CC (Orthopedic Implant) IMPLANT
MANIFOLD NEPTUNE II (INSTRUMENTS) ×2 IMPLANT
NDL SAFETY ECLIPSE 18X1.5 (NEEDLE) ×1 IMPLANT
NEEDLE HYPO 18GX1.5 SHARP (NEEDLE)
NS IRRIG 1000ML POUR BTL (IV SOLUTION) ×2 IMPLANT
PACK TOTAL JOINT (CUSTOM PROCEDURE TRAY) ×2 IMPLANT
PADDING CAST COTTON 6X4 STRL (CAST SUPPLIES) ×4 IMPLANT
PADDING WEBRIL 6 STERILE (GAUZE/BANDAGES/DRESSINGS) ×1 IMPLANT
PENCIL BUTTON HOLSTER BLD 10FT (ELECTRODE) ×1 IMPLANT
POSITIONER SURGICAL ARM (MISCELLANEOUS) ×2 IMPLANT
SET HNDPC FAN SPRY TIP SCT (DISPOSABLE) ×1 IMPLANT
SET PAD KNEE POSITIONER (MISCELLANEOUS) ×2 IMPLANT
SPONGE GAUZE 4X4 12PLY (GAUZE/BANDAGES/DRESSINGS) ×4 IMPLANT
SPONGE LAP 18X18 X RAY DECT (DISPOSABLE) ×2 IMPLANT
STAPLER VISISTAT 35W (STAPLE) ×1 IMPLANT
SUCTION FRAZIER 12FR DISP (SUCTIONS) ×2 IMPLANT
SUT PDS AB 1 CT1 27 (SUTURE) ×2 IMPLANT
SUT VIC AB 1 CT1 36 (SUTURE) ×6 IMPLANT
SUT VIC AB 2-0 CT1 27 (SUTURE) ×6
SUT VIC AB 2-0 CT1 TAPERPNT 27 (SUTURE) ×3 IMPLANT
SYR 50ML LL SCALE MARK (SYRINGE) ×1 IMPLANT
TOWEL OR 17X26 10 PK STRL BLUE (TOWEL DISPOSABLE) ×4 IMPLANT
TOWER CARTRIDGE SMART MIX (DISPOSABLE) ×3 IMPLANT
TRAY FOLEY CATH 14FRSI W/METER (CATHETERS) ×2 IMPLANT
WATER STERILE IRR 1500ML POUR (IV SOLUTION) ×2 IMPLANT
WRAP KNEE MAXI GEL POST OP (GAUZE/BANDAGES/DRESSINGS) ×2 IMPLANT

## 2011-03-25 NOTE — Brief Op Note (Signed)
03/25/2011  1:16 PM  PATIENT:  [name]  71 y.o. male  PRE-OPERATIVE DIAGNOSIS:  status post total knee infection   POST-OPERATIVE DIAGNOSIS:  Status post total knee infection  PROCEDURE:  Procedure(s): TOTAL KNEE REVISION  SURGEON:  Surgeon(s): Shelda Pal, MD  PHYSICIAN ASSISTANT: Lanney Gins, PA-C  ANESTHESIA:   regional and general  EBL:  Total I/O In: 2000 [I.V.:2000] Out: 325 [Urine:300; Blood:25]  BLOOD ADMINISTERED:none  DRAINS: (1 medium) Hemovact drain(s) in the right knee with  Suction Open   LOCAL MEDICATIONS USED:  NONE  SPECIMEN:  Source of Specimen:  Right knee and Aspirate  DISPOSITION OF SPECIMEN:  Microbiology for gram stain culture  COUNTS:  YES  TOURNIQUET:   Total Tourniquet Time Documented: Thigh (Right) - 120 minutes  DICTATION: .Other Dictation: Dictation Number [id number]  PLAN OF CARE: Admit to inpatient   PATIENT DISPOSITION:  PACU - hemodynamically stable.   Delay start of Pharmacological VTE agent (>24hrs) due to surgical blood loss or risk of bleeding:  {YES/NO/NOT APPLICABLE:20182

## 2011-03-25 NOTE — Progress Notes (Signed)
D: 71 yr old [facility] pt Dr. Charlann Boxer under [facility] POD#0 s/p Rt TKA revision with removal of cement antibiotic spacer with repeat irrigation and debridement and replacement of cement antibiotic spacer.  Pt with h/o Rt TKA in the past with subsequent infections and surgeries 08/2010, 11/20/2010, and 12/25/2010 with I&D and replacement of Abx spacer.  Pt also with h/o Lt TKA in 2011, HTN, hyperlipidemia, spinal stenosis, cerebral atherosclerosis, erectile dysfunction, BPH with urinary obstruction, Peptic Ulcer Disease, sleep apnea with no cpap use, pneumonia in the past, anxiety, depression, CAD with stents, ex-smoker quit in 2012.  At 1935 RN called to pt's bedside with c/o chest pain and discomfort. A: Complete assessment done at 1935.  Rt knee ace wrap c/d/i, with hvx1 charged and functioning, ice pack and knee immob in place.  teds and scd's to bilateral legs.  Rt upper arm PICC with NS + 20K at 100 ml/hr infusing.  Foley to straight drain with qs/c/y/uop.  Pt c/o painful urination.  Foley assessed again and pt having copious output through tubing.  Discomfort possibly related to h/o BPH with urinary obstruction.  Pt noted to have HR in 130's.  Assessed VS.  Noted as follows, HR - 135, R - 20, BP - 161/82, 99% on 2L via [place].  Discussed findings with [facility] RN in bedside report.  [facility] RN stated that pt was noted to have elevated 1645.  EKG was done and the PA was notified of the findings, but it was not addressed.  [facility] RN states she noted that pt's HR cont to fluctuate from 60's-70's to 120's.  At this time pt c/o chest discomfort, pain and heaviness.  RRT RN called at 1940 and requested to come up and evaluate pt.  Pt's attending PA paged and informed of pt status.  RRT RN at beside at approx 1950.  Pt placed on monitor and confirmed pt's HR jumping between 70's to 140's.  Further EKG showed pt going in and out of A-fib.  Pt's attending called to check on pt at approx 2010.  Call transferred to RRT  RN for status update.  Orders received for stat Bmet and Cardiac panel.  Decision also made to transfer pt to Telemetry bed for further monitoring. R: at 2020 [facility] notified of need for telemetry bed.  Spoke with Dawn on [address] and given bed assignment.  Informed at 2030 that pt would be down shortly and that report would be given bedside by Care RN and RRT RN.  Inda Merlin, RN

## 2011-03-25 NOTE — Anesthesia Postprocedure Evaluation (Signed)
  Anesthesia Post-op Note  Patient: [name]  Procedure(s) Performed:  TOTAL KNEE REVISION - Removal Of Cement Spacer  Repeat Irrigation and debridement and reimplantation of cement spacer right knee  Patient Location: PACU  Anesthesia Type: General  Level of Consciousness: awake and alert   Airway and Oxygen Therapy: Patient Spontanous Breathing  Post-op Pain: mild  Post-op Assessment: Post-op Vital signs reviewed, Patient's Cardiovascular Status Stable, Respiratory Function Stable, Patent Airway and No signs of Nausea or vomiting  Post-op Vital Signs: stable  Complications: No apparent anesthesia complications

## 2011-03-25 NOTE — Transfer of Care (Signed)
Immediate Anesthesia Transfer of Care Note  Patient: [name]  Procedure(s) Performed:  TOTAL KNEE REVISION - Removal Of Cement Spacer  Repeat Irrigation and debridement and reimplantation of cement spacer right knee  Patient Location: PACU  Anesthesia Type: General  Level of Consciousness: awake, alert , oriented and patient cooperative  Airway & Oxygen Therapy: Patient Spontanous Breathing and Patient connected to face mask oxygen  Post-op Assessment: Report given to PACU RN and Post -op Vital signs reviewed and stable  Post vital signs: Reviewed and stable  Complications: No apparent anesthesia complications

## 2011-03-25 NOTE — Anesthesia Preprocedure Evaluation (Addendum)
Anesthesia Evaluation  Patient identified by MRN, date of birth, ID band Patient awake    Reviewed: Allergy & Precautions, H&P , NPO status , Patient's Chart, lab work & pertinent test results, reviewed documented beta blocker date and time   History of Anesthesia Complications Negative for: history of anesthetic complications  Airway Mallampati: I TM Distance: >3 FB     Dental  (+) Edentulous Upper and Edentulous Lower   Pulmonary sleep apnea ,  Patient states he does not tolerate CPAP clear to auscultation  Pulmonary exam normal       Cardiovascular hypertension, + CAD, + Past MI and + Cardiac Stents Regular Normal    Neuro/Psych  Headaches, PSYCHIATRIC DISORDERS Anxiety Depression  Neuromuscular disease    GI/Hepatic Neg liver ROS, PUD,   Endo/Other  Negative Endocrine ROS  Renal/GU negative Renal ROS  Genitourinary negative   Musculoskeletal  (+) Arthritis -,   Abdominal   Peds negative pediatric ROS (+)  Hematology negative hematology ROS (+)   Anesthesia Other Findings   Reproductive/Obstetrics negative OB ROS                           Anesthesia Physical  Anesthesia Plan  ASA: III  Anesthesia Plan: General   Post-op Pain Management:    Induction: Intravenous  Airway Management Planned: Oral ETT  Additional Equipment:   Intra-op Plan:   Post-operative Plan: Extubation in OR  Informed Consent: I have reviewed the patients History and Physical, chart, labs and discussed the procedure including the risks, benefits and alternatives for the proposed anesthesia with the patient or authorized representative who has indicated his/her understanding and acceptance.   Dental advisory given  Plan Discussed with: CRNA  Anesthesia Plan Comments:         Anesthesia Quick Evaluation

## 2011-03-25 NOTE — Preoperative (Signed)
Beta Blockers   Reason not to administer Beta Blockers:5 mg bisoprolol taken 03-25-11 at 6 am

## 2011-03-25 NOTE — Interval H&P Note (Signed)
History and Physical Interval Note:  03/25/2011 10:14 AM  [name]  has presented today for surgery, with the diagnosis of status post total knee infection   The various methods of treatment have been discussed with the patient and family. After consideration of risks, benefits and other options for treatment, the patient has consented to  Procedure(s): RIGHT TOTAL KNEE REVISION following treatment for infection as a surgical intervention .  The patients' history has been reviewed, patient examined, no change in status, stable for surgery.  I have reviewed the patients' chart and labs.  Questions were answered to the patient's satisfaction.     Shelda Pal

## 2011-03-26 LAB — CBC
Platelets: 278 10*3/uL (ref 150–400)
RBC: 3.16 MIL/uL — ABNORMAL LOW (ref 4.22–5.81)
WBC: 10.6 10*3/uL — ABNORMAL HIGH (ref 4.0–10.5)

## 2011-03-26 LAB — BASIC METABOLIC PANEL
CO2: 28 mEq/L (ref 19–32)
Calcium: 8.3 mg/dL — ABNORMAL LOW (ref 8.4–10.5)
Chloride: 101 mEq/L (ref 96–112)
Potassium: 4 mEq/L (ref 3.5–5.1)
Sodium: 135 mEq/L (ref 135–145)

## 2011-03-26 MED ORDER — VANCOMYCIN HCL IN DEXTROSE 1-5 GM/200ML-% IV SOLN
1000.0000 mg | Freq: Two times a day (BID) | INTRAVENOUS | Status: DC
  Administered 2011-03-26 – 2011-03-27 (×3): 1000 mg via INTRAVENOUS
  Filled 2011-03-26 (×5): qty 200

## 2011-03-26 NOTE — Progress Notes (Signed)
Physical Therapy Treatment Patient Details Name: [name] MRN: [id number] DOB: [birth year] Today's Date: 03/26/2011  PT Assessment/Plan  PT - Assessment/Plan Comments on Treatment Session: Pt assisted back to bed and performed a couple exercises with KI.  Pt will likely need w/c at home for safe mobility. PT Plan: Discharge plan remains appropriate Follow Up Recommendations: Home health PT Equipment Recommended: Wheelchair (measurements) PT Goals  Acute Rehab PT Goals PT Goal: Supine/Side to Sit - Progress: Progressing toward goal PT Goal: Sit to Stand - Progress: Progressing toward goal PT Goal: Stand to Sit - Progress: Progressing toward goal  PT Treatment Precautions/Restrictions  Precautions Precautions: Knee Required Braces or Orthoses: Yes Knee Immobilizer: Discontinue once straight leg raise with < 10 degree lag Restrictions Weight Bearing Restrictions: Yes RLE Weight Bearing: Touchdown weight bearing Mobility (including Balance) Bed Mobility Bed Mobility: Yes Sit to Supine: 4: Min assist Sit to Supine - Details (indicate cue type and reason): assist for R LE support Transfers Transfers: Yes Sit to Stand: 4: Min assist;With upper extremity assist;From chair/3-in-1 Sit to Stand Details (indicate cue type and reason): assist to rise, verbal cues for armrests, full extension, pt does well with TDWB Stand to Sit: 4: Min assist;To bed;With upper extremity assist Stand to Sit Details: min/guard, verbal cue for R LE forward and hand to bed to control descent Stand Pivot Transfers: 4: Min assist Stand Pivot Transfer Details (indicate cue type and reason): assist to steady, with RW, pt able to take a couple hops on L LE,  Ambulation/Gait Ambulation/Gait:  (pt unable 2* increased R knee pain with movement)    Exercise  Total Joint Exercises Ankle Circles/Pumps: AROM;Both;20 reps;Supine Quad Sets: AROM;Strengthening;Both;20 reps;Supine Straight Leg Raises:  AROM;Strengthening;Right;5 reps;Supine (with KI and PT positioned to assist if needed) End of Session PT - End of Session Equipment Utilized During Treatment: Gait belt;Right knee immobilizer Activity Tolerance: Patient limited by pain Patient left: in bed;with call bell in reach Nurse Communication: Weight bearing status General Behavior During Session: [facility] for tasks performed Cognition: [facility] for tasks performed  [name],[name] 03/26/2011, 5:04 PM Pager: [phone]

## 2011-03-26 NOTE — Progress Notes (Signed)
Subjective: 1 Day Post-Op Procedure(s) (LRB): TOTAL KNEE REVISION (Right)   Patient reports pain as mild. Pt states that he felt anxious with the foley, the pain and the placement of the PICC line. He states that this morning he feels much better and he doesn't feel as if his heart rate has been increasing since about 0200 AM. The major this was the placement of the PICC line that got him worked up. The foley is still bothering him and he would like to get this removed. Otherwise he is feeling good this morning.  Objective:   VITALS:   Filed Vitals:   03/26/11 0537  BP: 106/57  Pulse: 80  Temp: 98.7 F (37.1 C)  Resp: 20    Neurovascular intact Dorsiflexion/Plantar flexion intact Incision: dressing C/D/I No cellulitis present Compartment soft  LABS  Basename 03/26/11 0545  HGB 8.3*  HCT 26.4*  WBC 10.6*  PLT 278     Basename 03/26/11 0545 03/25/11 2050  NA 135 136  K 4.0 4.1  BUN 10 13  CREATININE 1.03 0.99  GLUCOSE 89 119*     Assessment/Plan: 1 Day Post-Op Procedure(s) (LRB): TOTAL KNEE REVISION (Right)   Foley cath d/c'ed HV drain d/c'ed Advance diet Up with therapy D/C IV fluids Discharge home with home health upon d/c   [name]. [name]   PAC  03/26/2011, 8:24 AM

## 2011-03-26 NOTE — Op Note (Signed)
[name], [name] NO.:  [id number]  MEDICAL RECORD NO.:  [id number]  LOCATION:  1411                         FACILITY:  [facility]  PHYSICIAN:  Madlyn Frankel. Charlann Boxer, M.D.  DATE OF BIRTH:  1940-06-24  DATE OF PROCEDURE:  03/25/2011 DATE OF DISCHARGE:                              OPERATIVE REPORT   PREOPERATIVE DIAGNOSIS:  Infected right total knee replacement, status post resection and multiple incision and drainages in order to get the skin closed.  POSTOPERATIVE DIAGNOSIS:  Infected right total knee replacement, status post resection and multiple incision and drainages in order to get the skin closed.  PROCEDURE:  Repeat incision and drainage with replacement of antibiotic spacers including using Stimulan beads, antibiotic impregnated beads.  FINDINGS:  Preoperatively, the patient had a continued elevated CRP and sed rate.  He had felt that he had had a gastrointestinal viral issue; however, his knee appeared to have some swelling on it, was unable to aspirate his knee in the office.  However in the OR, the fluid appeared to be more of a seromatous type appearance.  There was no obvious purulence or pus, but it was not a clear synovial fluid by any means.  Bone quality was decent, other than defects noted from previous resection of cemented implants.  SURGEON:  Madlyn Frankel. Charlann Boxer, M.D.  ASSISTANT:  Lanney Gins, PA-C  Note that [name] was present for the entirety of the case critical for use during preoperative positioning, perioperative retractor management, general facilitation of the case as well as primary wound closure.  ANESTHESIA:  General plus a regional femoral nerve block, preoperative.  SPECIMENS:  I did send the joint fluid to Microbiology.  This went as a stat Gram stain culture with findings of moderate white blood cells and predominantly PMNs.  DRAINS:  One medium Hemovac.  TOURNIQUET TIME:  120 minutes at 250 mmHg.  INDICATION FOR  PROCEDURE:  [name] is a 71 year old patient whom I have been trying to manage an infected right knee with chronic wound infection.  He had undergone resection arthroplasty and several other procedures and trying to attempt I and D of his knee and getting his skin to heal.  We subsequently evaluated his knee to heal with a plan for reimplantation scheduled while they are off antibiotics about 4 weeks.  Clinically, he had presented and had some swelling to the knee, had reported not feeling great, and worried that perhaps there was a gastrointestinal bug.  He had an elevated sed rate and CRP.  Despite these variables, I felt that it was worth [place] proceeding at least for, if not able to reimplant his Knee, that I would be able to re-I and D his knee and try to get better chance of him healing without complication.  After reviewing the risks and benefits, consent was obtained for management of his knee.  PROCEDURE IN DETAIL:  The patient was brought to the operative theater. Once adequate anesthesia, preoperative antibiotics, Ancef was  given preop, vancomycin intraoperatively.  A right thigh tourniquet was placed.  The right lower extremity was prepped and draped in a sterile fashion with the right foot placed into the [facility]  legholder.  A time-out was performed identifying the patient, planned procedure, and extremity. Leg was exsanguinated, tourniquet elevated to 250 mmHg.  The patient's old, somewhat curvilinear based incision over the anterior aspect of the knee was carried out.  At this point, a significant amount of time was spent on soft tissue dissection separating the skin and subcutaneous layers from the underlying extensor mechanism.  Once these planes were created, we created a median arthrotomy just about 2 cm proximal to the patella, at which point, did a quadriceps snip to allow for exposure.  We did encounter the joint fluid at this point, for which cultures were  obtained.  A significant synovectomy was carried out medially and laterally, allowing for exposure with proximal and medial peel, recreating or trying to recreate the medial and lateral gutters. At this point, without the culture results being back, I went ahead and continued on with the case, following debridement of the extensor mechanism around the patella.  I did resect another 10 mm of bone including some retained cement down to a measurement of approximately 14 mm and drilled lug holes for 41 patellar button.  I then placed a metal shim to protect the patellar tendon during the middle of the case.  At this point, the proximal tibia and distal femur were opened up to a 15-mm reamer, I then irrigated both the femoral canal and tibial canal with a canal brush irrigator about a liter in each.  Attention was first directed to the tibial sheath and he was noted to have significant bone defects and loss from his previous component removal.  I was able to fit a size 5 tibial tray on the tibia. I did resect a small amount of bone on the lateral side to keep this as a perpendicular cut compared to the tibial cut parallel to the tibial spine.  Once this was recreated and confirmed with an extramedullary guide alignment rod, I then drilled through a M.B.T revision tray.  I then broached the proximal tibia and had good enough fit with a 29 cemented sleeve.  This will end up being the final components.  Thus, the tibia at this point with a trial component set up with a size 5 tibial tray, M.B.T revision tray with a 29 cemented sleeve, there were no augments used during the trial, but then may end up being on the final components.  At this point, with the perpendicular cut on the tibial shaft and the trial components in place, I attended to the femur at this point. Intramedullary guide was positioned and the distal femoral cutting block was positioned over the distal femur with only removing  about a millimeter of bone off the very extremes and medial and lateral edges of the femur.  The size 5 femur seemed to fit best in the medial and lateral dimensions.  A size 5 block was then set using the tibial tray to set rotation based on the alignment of the proximal tibia.  It was pinned into position with a +2 bolt, minimal bone resected off the anterior femur on the posterior lateral aspect.  There was no bone up to 8 mm, thus an 8 mm augment was going to be in position with the femur and on the medial side, I chose to use a 4-mm augment with 8 mm cut, would have removed some bone, so I decided to leave that bone.  The chamfer cuts were made followed by the box cut for TC3 component.  Trial  reduction was now carried out with a size 5 femoral component with a 5-degree adapter with a +2 bolt, an 8-mm posterior lateral augment, 4 mm posterior medial augment.  A trial reduction now carried out with this impacting the placement and the tibial tray in place.  The knee came toextension with a little bit of hyperextension and tight in flexion, but this could have been due to scarring.  Around the time that this was being carried out, the results from the lab came back, displaying moderate white blood cells mainly polymorphonuclear cells.  Given these findings, his elevated CRP and sed rate, I made a clinical decision not to reimplant his knee.  I removed the trial components, irrigated his knee with another 2.5 L normal saline solution and pulse lavage.  We had made Stimulan bead at this point to help with his infectious process.  This 10 cc of Stimulan was mixed with 1 g vancomycin and 1 of tobramycin.  Next 4 batches of cement that was gentamicin-impregnated GHV cement and 4 more grams of vancomycin.  Once the vancomycin and gentamicin-impregnated cement was prepared, I placed some into the proximal tibia, some into the distal femur, and then a block between the tibial femoral  space.  A smaller piece was made for the suprapatellar pouch, with tourniquet already on. We irrigated these cement blocks as the cement had fully cured, to prevent excessive heating and soft tissues.  Once this was carried out, the Stimulan beads were placed into the medial and lateral gutters.  The medium Hemovac drain was placed deep.  The extensor mechanism was reapproximated using #1 PDS suture.  The tourniquet was let down after 120 minutes.  The remainder of the wound was closed with 2-0 Vicryl and staples on the skin.  It all came together nicely with minimal tension on the skin at this point, given what he has been through.  The knee was cleaned, dried, and dressed sterilely using Xeroform and a bulky sterile wrap.  The patient was brought to the recovery room in a stable condition, tolerating the procedure well.  Postoperative plan for Mr. Furtick is for him to receive IV vancomycin for 4 weeks, was scheduled for reimplantation in 7 weeks, but it will be very important to re-order sedimentation and CRP values to confirm that they are reducing, indicating  treatment.  Questions  will be dealt with with he and his wife in the postop period.     Madlyn Frankel Charlann Boxer, M.D.     MDO/MEDQ  D:  03/25/2011  T:  03/26/2011  Job:  [id number]

## 2011-03-26 NOTE — Evaluation (Signed)
Physical Therapy Evaluation Patient Details Name: PAULINE TRAINER MRN: [id number] DOB: 19-Mar-1940 Today's Date: 03/26/2011  Problem List:  Patient Active Problem List  Diagnoses  . Coronary artery disease  . Hyperlipidemia  . Pre-operative cardiovascular examination, high risk surgery  . MELANOMA, SPREADING, SUPERFICIAL  . HYPERLIPIDEMIA  . ERECTILE DYSFUNCTION  . SLEEP APNEA, OBSTRUCTIVE, MODERATE  . OTHER IDIOPATHIC PERIPHERAL AUTONOMIC NEUROPATHY  . HYPERTENSION  . CORONARY ARTERY DISEASE  . CEREBRAL ATHEROSCLEROSIS  . PNEUMONIA  . PEPTIC ULCER DISEASE  . BENIGN PROSTATIC HYPERTROPHY, WITH URINARY OBSTRUCTION  . ACTINIC KERATOSIS  . KNEE PAIN, ACUTE  . SPINAL STENOSIS OF LUMBAR REGION  . LOW BACK PAIN  . CALF PAIN, LEFT  . HEADACHE  . SYMPTOM, NOCTURIA  . SPRAIN&STRAIN OF UNSPECIFIED SITE OF KNEE&LEG  . 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 Hypertension   . Hyperlipidemia   . Preoperative cardiovascular examination   . Spinal stenosis, lumbar   . CAS (cerebral atherosclerosis)   . Erectile dysfunction   . Preventative health care   . Benign prostatic hypertrophy with urinary obstruction   . Family history of early CAD     male 1st degree relative <50  . Peptic ulcer disease   . Low back pain   . Superficial spreading melanoma   . Sleep apnea, obstructive     CPAP-does not use  . HA (headache)     sinus headaches  . Arthritis     knees  . Myocardial infarction 1997    Hx MI 1997 and 1998  . Anxiety   . Pneumonia 2005  . Anemia   . Depression     takes Zoloft  . Neuropathy, autonomic, idiopathic peripheral, other   . Blood transfusion 1 yr. ago    jerking,fever-after blood transfusion  . Blood transfusion     given wrong blood  . Coronary artery disease 1998    cardiac stent/ Clearance Dr Lovell Sheehan and Jacinto Halim with note on chart   Past Surgical History:  Past Surgical History   Procedure Date  . Esophagogastroduodenoscopy 2004  . Colonoscopy 2004  . Lumbar laminectomy   . Lumbar fusion   . Knee arthroscopy     left  . Shoulder arthroscopy     right  . Knee arthroscopy     right  . Decompression of the median nerve     left wrist and hand  . Revision of tkr 2011    on left  . Coronary stent placement   . Back surgery 2005-last    lumbar x2  . Excisional total knee arthroplasty 12/25/2010    Procedure: EXCISIONAL TOTAL KNEE ARTHROPLASTY;  Surgeon: Shelda Pal;  Location: [facility];  Service: Orthopedics;  Laterality: Right;  repeat irrigation   and debridement, removal and reinsertation of spacer block  . Coronary angioplasty 1998  . Joint replacement 2011    left knee x 2 ; 08/2010-right knee-infected    PT Assessment/Plan/Recommendation PT Assessment Clinical Impression Statement: Pt s/p right total knee revision.  Pt would benefit from acute PT services in order to improve transfers and gait prior to d/c home with spouse. PT Recommendation/Assessment: Patient will need skilled PT in the acute care venue PT Problem List: Decreased strength;Decreased balance;Decreased mobility;Decreased knowledge of use of DME;Decreased knowledge of precautions;Pain;Decreased range of motion PT Therapy Diagnosis : Difficulty walking;Acute pain PT Plan PT Frequency: 7X/week PT Treatment/Interventions: DME instruction;Gait  training;Stair training;Functional mobility training;Therapeutic activities;Therapeutic exercise;Balance training;Patient/family education PT Recommendation Follow Up Recommendations: Home health PT Equipment Recommended: None recommended by PT PT Goals  Acute Rehab PT Goals PT Goal Formulation: With patient Time For Goal Achievement: 7 days Pt will go Supine/Side to Sit: with supervision PT Goal: Supine/Side to Sit - Progress: Goal set today Pt will go Sit to Stand: with supervision PT Goal: Sit to Stand - Progress: Goal set today Pt will go  Stand to Sit: with supervision PT Goal: Stand to Sit - Progress: Goal set today Pt will Ambulate: 51 - 150 feet;with supervision;with least restrictive assistive device PT Goal: Ambulate - Progress: Goal set today Pt will Go Up / Down Stairs: 3-5 stairs;with rail(s);with least restrictive assistive device;with min assist PT Goal: Up/Down Stairs - Progress: Goal set today Pt will Perform Home Exercise Program: with supervision, verbal cues required/provided PT Goal: Perform Home Exercise Program - Progress: Goal set today  PT Evaluation Precautions/Restrictions  Precautions Precautions: Knee Required Braces or Orthoses: Yes Knee Immobilizer: Discontinue once straight leg raise with < 10 degree lag Restrictions Weight Bearing Restrictions: Yes RLE Weight Bearing: Touchdown weight bearing Prior Functioning  Home Living Lives With: Spouse Type of Home: House Home Layout: Two level;Able to live on main level with bedroom/bathroom Home Access: Stairs to enter Entrance Stairs-Rails: Can reach both Entrance Stairs-Number of Steps: 3 Home Adaptive Equipment: Bedside commode/3-in-1;Walker - rolling;Shower chair with back Prior Function Level of Independence: Independent with basic ADLs;Requires assistive device for independence Cognition Cognition Arousal/Alertness: Awake/alert Overall Cognitive Status: Appears within functional limits for tasks assessed Sensation/Coordination   Extremity Assessment RUE Assessment RUE Assessment: Within Functional Limits (per functional observation) LUE Assessment LUE Assessment: Within Functional Limits (per functional observation) RLE Assessment RLE Assessment: Not tested (maintained KI) LLE Assessment LLE Assessment: Within Functional Limits (per functional observation) Mobility (including Balance) Bed Mobility Bed Mobility: Yes Supine to Sit: 4: Min assist Supine to Sit Details (indicate cue type and reason): assist for R  LE Transfers Transfers: Yes Sit to Stand: From bed;4: Min assist;From elevated surface Sit to Stand Details (indicate cue type and reason): assist to rise, verbal cues for hand placement Stand to Sit: 4: Min assist;With armrests;To chair/3-in-1 Stand to Sit Details: min/guard, verbal cue to use armrests to control descent Stand Pivot Transfers: 4: Min assist Stand Pivot Transfer Details (indicate cue type and reason): with RW, assist to steady 2* TDWB status on R LE Ambulation/Gait Ambulation/Gait: No (pt unable 2* increased R knee pain with movement)    Exercise    End of Session PT - End of Session Equipment Utilized During Treatment: Gait belt;Right knee immobilizer Activity Tolerance: Patient limited by pain Patient left: in chair;with call bell in reach General Behavior During Session: [facility] for tasks performed Cognition: [facility] for tasks performed  Kazuto Sevey,[name] 03/26/2011, 12:07 PM Pager: [phone]

## 2011-03-26 NOTE — Progress Notes (Signed)
ANTIBIOTIC CONSULT NOTE - INITIAL  Pharmacy Consult for Vancomycin Indication: infected knee prothetic    Allergies  Allergen Reactions  . Acetaminophen     REACTION:LIVER INFLAMMATION IN HIGH DOSES    Patient Measurements: Height: 5\' 10"  (177.8 cm) Weight: 188 lb (85.276 kg) IBW/kg (Calculated) : 73    Vital Signs: Temp: 98.4 F (36.9 C) (02/05 0942) Temp src: Oral (02/05 0942) BP: 93/57 mmHg (02/05 0942) Pulse Rate: 94  (02/05 0942) Intake/Output from previous day: 02/04 0701 - 02/05 0700 In: 5013.3 [P.O.:960; I.V.:3798.3; IV Piggyback:255] Out: 3220 [Urine:2720; Drains:475; Blood:25] Intake/Output from this shift: Total I/O In: 300 [P.O.:300] Out: 400 [Urine:400]  Labs:  [facility] 03/26/11 0545 03/25/11 2050  WBC 10.6* --  HGB 8.3* --  PLT 278 --  LABCREA -- --  CREATININE 1.03 0.99   Estimated Creatinine Clearance: 66.9 ml/min (by C-G formula based on Cr of 1.03).  65 ml/min (normalized)   Microbiology: Recent Results (from the past 720 hour(s))  SURGICAL PCR SCREEN     Status: Normal   Collection Time   03/20/11 12:10 PM      Component Value Range Status Comment   MRSA, PCR NEGATIVE  NEGATIVE  Final    Staphylococcus aureus NEGATIVE  NEGATIVE  Final   GRAM STAIN     Status: Normal   Collection Time   03/25/11 11:10 AM      Component Value Range Status Comment   Specimen Description SYNOVIAL RT KNEE   Final    Special Requests NONE   Final    Gram Stain     Final    Value: ABUNDANT WBC PRESENT, PREDOMINANTLY PMN     NO ORGANISMS SEEN     Gram Stain Report Called to,Read Back By and Verified With:     REYNOLDS,D. RN AT 1155 ON 03/25/11 BY GILLESPIE,B.   Report Status 03/25/2011 FINAL   Final   BODY FLUID CULTURE     Status: Normal (Preliminary result)   Collection Time   03/25/11 11:10 AM      Component Value Range Status Comment   Specimen Description SYNOVIAL RT KNEE   Final    Special Requests NONE   Final    Gram Stain     Final    Value:  ABUNDANT WBC PRESENT, PREDOMINANTLY PMN     NO ORGANISMS SEEN     Gram Stain Report Called to,Read Back By and Verified With: Gram Stain Report Called to,Read Back By and Verified With: REYNOLDS D RN 1155 03/25/11 BY Jarvis Newcomer B Performed by [facility]   Culture NO GROWTH 1 DAY   Final    Report Status PENDING   Incomplete   ANAEROBIC CULTURE     Status: Normal (Preliminary result)   Collection Time   03/25/11 11:10 AM      Component Value Range Status Comment   Specimen Description SYNOVIAL RT KNEE   Final    Special Requests NONE   Final    Gram Stain     Final    Value: RARE WBC PRESENT,BOTH PMN AND MONONUCLEAR     NO SQUAMOUS EPITHELIAL CELLS SEEN     NO ORGANISMS SEEN   Culture PENDING   Incomplete    Report Status PENDING   Incomplete     Medical History: Past Medical History  Diagnosis Date  . Hypertension   . Hyperlipidemia   . Preoperative cardiovascular examination   . Spinal stenosis, lumbar   . CAS (cerebral atherosclerosis)   .  Erectile dysfunction   . Preventative health care   . Benign prostatic hypertrophy with urinary obstruction   . Family history of early CAD     male 1st degree relative <50  . Peptic ulcer disease   . Low back pain   . Superficial spreading melanoma   . Sleep apnea, obstructive     CPAP-does not use  . HA (headache)     sinus headaches  . Arthritis     knees  . Myocardial infarction 1997    Hx MI 1997 and 1998  . Anxiety   . Pneumonia 2005  . Anemia   . Depression     takes Zoloft  . Neuropathy, autonomic, idiopathic peripheral, other   . Blood transfusion 1 yr. ago    jerking,fever-after blood transfusion  . Blood transfusion     given wrong blood  . Coronary artery disease 1998    cardiac stent/ Clearance Dr Lovell Sheehan and Jacinto Halim with note on chart    Medications:  Scheduled:    . bisoprolol  2.5 mg Oral QPC breakfast  . docusate sodium  100 mg Oral BID  . ferrous sulfate  325 mg Oral TID PC  . HYDROmorphone        . HYDROmorphone      . HYDROmorphone      . HYDROmorphone      . morphine  30 mg Oral BID  . oxyCODONE  10-20 mg Oral Q4H  . polyethylene glycol  17 g Oral BID  . rivaroxaban  10 mg Oral Q24H  . simvastatin  40 mg Oral QHS  . vancomycin  1,000 mg Intravenous Once  . DISCONTD: vancomycin  1,000 mg Intravenous Q12H   Assessment:  71 yo male s/p right total knee revision with removal/replacement of cement spacer and repeat I&D on 2/4  To begin long term vanc use for infected knee prothetic   Goal of Therapy:  Vancomycin trough level 15-20 mcg/ml  Plan:   Vancomycin 1gm IV q12h  Check trough at steady state  Follow up renal function & cultures  Loralee Pacas, PharmD, BCPS Pager: [phone] 03/26/2011,11:43 AM

## 2011-03-26 NOTE — Significant Event (Signed)
Rapid Response Event Note  Overview: Time Called: 1950 Arrival Time: 1953 Event Type: Cardiac  Initial Focused Assessment: Mr. Hallquist resting in bed, anxious, alert and oriented. Heart rate 60's-70's initially SR with Pac's then  changed to A-fib vs SVT. V/S stable with BP elevated. Several episodes of A-fib, SVT with several EKG's done. Pt c/o post op pain and was given Dilaudid. He was also given xanax for anxiety. He stated he was having mid chest discomfort that was intermittent and felt like indigestion. Upon moving to 1411 he was given maalox. PT also very uncomfortable with foley.  Interventions: EKG'S. MEDS FOR PAIN, ANXIETY AND "INDIGESTION" LABS DRAWN: BMET AND CARDIAC PANEL   Event Summary: PT TRANSFERRED TO TELEMETRY, LABS DRAWN AND REPORT GIVEN. V/S STABLE AND PT MORE COMFORTABLE. NO TREATMENT AT THIS TIME FOR THE BRIEF INTERMITTENT EPISODES OF INCREASED HEAR RATE.   at          Kevan Ny B

## 2011-03-26 NOTE — Progress Notes (Signed)
Patient received handout information on xarelto.

## 2011-03-26 NOTE — Progress Notes (Signed)
  CARE MANAGEMENT NOTE 03/26/2011  Patient:  [name], [name]   Account Number:  [id number]  Date Initiated:  03/26/2011  Documentation initiated by:  Colleen Can  Subjective/Objective Assessment:   dx infected right total knee replacemnt, s/p resection and multiple incision and drainages; repeat incision and drainage with replacemnt of antibiotic spacer     Action/Plan:   Plans home with [facility] services. Wants o use HH agency that he used last time he was in hospital. Can't remember name now. CM will follow up &research for name of agency   Anticipated DC Date:  03/28/2011   Anticipated DC Plan:  HOME W HOME HEALTH SERVICES  In-house referral  NA      DC Planning Services  CM consult      Status of service:  In process, will continue to follow

## 2011-03-27 LAB — BASIC METABOLIC PANEL
BUN: 11 mg/dL (ref 6–23)
Creatinine, Ser: 1.08 mg/dL (ref 0.50–1.35)
GFR calc Af Amer: 77 mL/min — ABNORMAL LOW (ref >90)
GFR calc non Af Amer: 67 mL/min — ABNORMAL LOW (ref >90)

## 2011-03-27 LAB — CBC
HCT: 27.7 % — ABNORMAL LOW (ref 39.0–52.0)
MCHC: 31.8 g/dL (ref 30.0–36.0)
MCV: 83.2 fL (ref 78.0–100.0)
RDW: 16.4 % — ABNORMAL HIGH (ref 11.5–15.5)

## 2011-03-27 MED ORDER — DIPHENHYDRAMINE HCL 25 MG PO CAPS: 25.0000 mg | ORAL_CAPSULE | Freq: Four times a day (QID) | ORAL | Status: DC | PRN

## 2011-03-27 MED ORDER — ALPRAZOLAM 0.5 MG PO TABS: 0.5000 mg | ORAL_TABLET | Freq: Three times a day (TID) | ORAL | Status: DC | PRN

## 2011-03-27 MED ORDER — DSS 100 MG PO CAPS: 100.0000 mg | ORAL_CAPSULE | Freq: Two times a day (BID) | ORAL | Status: AC

## 2011-03-27 MED ORDER — VANCOMYCIN HCL IN DEXTROSE 1-5 GM/200ML-% IV SOLN: 1000.0000 mg | Freq: Two times a day (BID) | INTRAVENOUS | Status: DC

## 2011-03-27 MED ORDER — FERROUS SULFATE 325 (65 FE) MG PO TABS: 325.0000 mg | ORAL_TABLET | Freq: Three times a day (TID) | ORAL | Status: DC

## 2011-03-27 MED ORDER — ZOLPIDEM TARTRATE 5 MG PO TABS: 5.0000 mg | ORAL_TABLET | Freq: Every evening | ORAL | Status: DC | PRN

## 2011-03-27 MED ORDER — ASPIRIN EC 325 MG PO TBEC: 325.0000 mg | DELAYED_RELEASE_TABLET | Freq: Two times a day (BID) | ORAL | Status: AC

## 2011-03-27 MED ORDER — HEPARIN SOD (PORK) LOCK FLUSH 100 UNIT/ML IV SOLN: 250.0000 [IU] | INTRAVENOUS | Status: DC | PRN

## 2011-03-27 MED ORDER — POLYETHYLENE GLYCOL 3350 17 G PO PACK: 17.0000 g | PACK | Freq: Two times a day (BID) | ORAL | Status: AC

## 2011-03-27 MED ORDER — METHOCARBAMOL 500 MG PO TABS: 500.0000 mg | ORAL_TABLET | Freq: Four times a day (QID) | ORAL | Status: AC | PRN

## 2011-03-27 MED ORDER — OXYCODONE HCL 10 MG PO TABS: 10.0000 mg | ORAL_TABLET | ORAL | Status: AC

## 2011-03-27 NOTE — Evaluation (Signed)
Occupational Therapy Evaluation Patient Details Name: [name] MRN: [id number] DOB: 01-Nov-1940 Today's Date: 03/27/2011  Problem List:  Patient Active Problem List  Diagnoses  . Coronary artery disease  . Hyperlipidemia  . Pre-operative cardiovascular examination, high risk surgery  . MELANOMA, SPREADING, SUPERFICIAL  . HYPERLIPIDEMIA  . ERECTILE DYSFUNCTION  . SLEEP APNEA, OBSTRUCTIVE, MODERATE  . OTHER IDIOPATHIC PERIPHERAL AUTONOMIC NEUROPATHY  . HYPERTENSION  . CORONARY ARTERY DISEASE  . CEREBRAL ATHEROSCLEROSIS  . PNEUMONIA  . PEPTIC ULCER DISEASE  . BENIGN PROSTATIC HYPERTROPHY, WITH URINARY OBSTRUCTION  . ACTINIC KERATOSIS  . KNEE PAIN, ACUTE  . SPINAL STENOSIS OF LUMBAR REGION  . LOW BACK PAIN  . CALF PAIN, LEFT  . HEADACHE  . SYMPTOM, NOCTURIA  . SPRAIN&STRAIN OF UNSPECIFIED SITE OF KNEE&LEG  . 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 Hypertension   . Hyperlipidemia   . Preoperative cardiovascular examination   . Spinal stenosis, lumbar   . CAS (cerebral atherosclerosis)   . Erectile dysfunction   . Preventative health care   . Benign prostatic hypertrophy with urinary obstruction   . Family history of early CAD     male 1st degree relative <50  . Peptic ulcer disease   . Low back pain   . Superficial spreading melanoma   . Sleep apnea, obstructive     CPAP-does not use  . HA (headache)     sinus headaches  . Arthritis     knees  . Myocardial infarction 1997    Hx MI 1997 and 1998  . Anxiety   . Pneumonia 2005  . Anemia   . Depression     takes Zoloft  . Neuropathy, autonomic, idiopathic peripheral, other   . Blood transfusion 1 yr. ago    jerking,fever-after blood transfusion  . Blood transfusion     given wrong blood  . Coronary artery disease 1998    cardiac stent/ Clearance Dr Lovell Sheehan and Jacinto Halim with note on chart   Past Surgical History:  Past Surgical  History  Procedure Date  . Esophagogastroduodenoscopy 2004  . Colonoscopy 2004  . Lumbar laminectomy   . Lumbar fusion   . Knee arthroscopy     left  . Shoulder arthroscopy     right  . Knee arthroscopy     right  . Decompression of the median nerve     left wrist and hand  . Revision of tkr 2011    on left  . Coronary stent placement   . Back surgery 2005-last    lumbar x2  . Excisional total knee arthroplasty 12/25/2010    Procedure: EXCISIONAL TOTAL KNEE ARTHROPLASTY;  Surgeon: Shelda Pal;  Location: [facility];  Service: Orthopedics;  Laterality: Right;  repeat irrigation   and debridement, removal and reinsertation of spacer block  . Coronary angioplasty 1998  . Joint replacement 2011    left knee x 2 ; 08/2010-right knee-infected    OT Assessment/Plan/Recommendation OT Assessment Clinical Impression Statement: Pt presents with deficits in areas of LB ADL's and selfcare tasks as well as functional mobility in these areas. He has A/E & DME and plans to d/c home with PRN assist from his wife and daughter's (at times). He will need S-Min A w/ ADL's and states that his family can provide this level of care. Will sign off OT at this time, no further OT needs. OT Recommendation/Assessment: Patient does  not need any further OT services OT Recommendation Follow Up Recommendations: No OT follow up Equipment Recommended: None recommended by OT   OT Evaluation Precautions/Restrictions  Precautions Precautions: Knee Required Braces or Orthoses: Yes Knee Immobilizer: Discontinue once straight leg raise with < 10 degree lag Restrictions Weight Bearing Restrictions: Yes RLE Weight Bearing: Touchdown weight bearing Prior Functioning Home Living Lives With: Spouse Receives Help From: Family Type of Home: House Home Layout: Two level;Able to live on main level with bedroom/bathroom Home Access: Stairs to enter Entrance Stairs-Rails: Can reach both Entrance Stairs-Number of Steps:  3 Bathroom Shower/Tub: Health visitor: Standard Bathroom Accessibility: Yes How Accessible: Accessible via walker;Accessible via wheelchair Home Adaptive Equipment: Bedside commode/3-in-1;Walker - rolling;Shower chair with back;Sock aid;Reacher;Long-handled shoehorn Prior Function Level of Independence: Independent with basic ADLs;Requires assistive device for independence ADL ADL Eating/Feeding: Performed;Modified independent Where Assessed - Eating/Feeding: Bed level Grooming: Simulated;Modified independent Where Assessed - Grooming: Sitting, bed;Sitting, chair Upper Body Bathing: Simulated;Modified independent Where Assessed - Upper Body Bathing: Sitting, chair;Sitting, bed Lower Body Bathing: Simulated;Minimal assistance Where Assessed - Lower Body Bathing: Sitting, chair;Sitting, bed;Sit to stand from chair;Sit to stand from bed Upper Body Dressing: Simulated;Modified independent Where Assessed - Upper Body Dressing: Sitting, chair;Sitting, bed Lower Body Dressing: Performed;Minimal assistance Where Assessed - Lower Body Dressing: Sitting, bed Toilet Transfer: Simulated;Minimal assistance;Supervision/safety Toilet Transfer Details (indicate cue type and reason): Min guard assist (first attempt, then Supervision second attempt bed to w/c, VC's for hand placement, pt does well with TDWB RLE noted.  Toilet Transfer Method: Stand pivot;Other (comment) (EOB-RW-W/C) Toilet Transfer Equipment: Raised toilet seat with arms (or 3-in-1 over toilet);Drop arm bedside commode Toileting - Clothing Manipulation: Simulated;Supervision/safety;Minimal assistance Where Assessed - Toileting Clothing Manipulation: Standing Toileting - Hygiene: Simulated;Modified independent Where Assessed - Toileting Hygiene: Sit on 3-in-1 or toilet Tub/Shower Transfer: Not assessed Equipment Used: Wheelchair;Rolling walker Ambulation Related to ADLs: Pt able to take several "hops"/steps from EOB to  W/C x2 then W/C to chair while maintaining TDWB RLE. VC's for hand placement & safety w/ RW, W/C ADL Comments: Pt presents with deficits in areas of LB ADL's and selfcare tasks as well as functional mobility in these areas. He has A/E & DME and plans to d/c home with PRN assist from his wife and daughter's (at times). He will need S-Min A w/ ADL's and states that his family can provide this level of care. Will sign off OT at this time, no further OT needs. Vision/Perception  Vision - History Baseline Vision: Wears glasses all the time Patient Visual Report: No change from baseline Cognition Cognition Arousal/Alertness: Awake/alert Overall Cognitive Status: Appears within functional limits for tasks assessed Orientation Level: Oriented X4 Sensation/Coordination Sensation Light Touch: Appears Intact Coordination Gross Motor Movements are Fluid and Coordinated: Yes Fine Motor Movements are Fluid and Coordinated: Yes Extremity Assessment RUE Assessment RUE Assessment: Within Functional Limits LUE Assessment LUE Assessment: Within Functional Limits Mobility  Bed Mobility Bed Mobility: Yes Supine to Sit: 4: Min assist Supine to Sit Details (indicate cue type and reason): R LE  Transfers Transfers: Yes Sit to Stand: 4: Min guard assist;With upper extremity assist;From chair/3-in-1;5: Supervision;With armrests;From bed;Other (comment) (EOB to W/C) Stand to Sit: 4: Min Guard assist;To bed;With upper extremity assist   End of Session OT - End of Session Equipment Utilized During Treatment: Right knee immobilizer;Other (comment) (RW, W/C) Activity Tolerance: Patient tolerated treatment well Patient left: in chair;with call bell in reach General Behavior During Session: [facility] for tasks performed Cognition:  WFL for tasks performed   Alm Bustard 03/27/2011, 11:16 AM

## 2011-03-27 NOTE — Progress Notes (Signed)
  CARE MANAGEMENT NOTE 03/27/2011  Patient:  [name], [name]   Account Number:  [id number]  Date Initiated:  03/26/2011  Documentation initiated by:  Colleen Can  Subjective/Objective Assessment:   dx infected right total knee replacemnt, s/p resection and multiple incision and drainages; repeat incision and drainage with replacemnt of antibiotic spacer     Action/Plan:   Plans home with [facility] services. Wants o use HH agency that he used last time he was in hospital. Can't remember name now. CM will follow up &research for name of agency   Anticipated DC Date:  03/28/2011   Anticipated DC Plan:  HOME W HOME HEALTH SERVICES  In-house referral  NA      DC Planning Services  CM consult      Choice offered to / List presented to:     DME arranged  [facility] - MANUAL      DME agency  [facility]     HH arranged  HH-1 RN  HH-2 PT      [facility] agency  [facility]   Status of service:  In process, will continue to follow Medicare Important Message given?   (If response is "NO", the following Medicare IM given date fields will be blank) Date Medicare IM given:   Date Additional Medicare IM given:    Discharge Disposition:    Per UR Regulation:    Comments:  03/27/2011 Raynelle Bring BSN CCM [phone] Per Shawn Route note from previous hospital visit, patient was set up with [facility]. Pt advised of HH agency that he has used. He is choosing to use [facility]. [facility] notified of request for services and can service patient. Pt will require HOme IV antibiotics . List of [facility] agencies for choice placed on shadow chart.

## 2011-03-27 NOTE — Progress Notes (Signed)
Subjective: 2 Days Post-Op Procedure(s) (LRB): TOTAL KNEE REVISION (Right)   Patient reports pain as mild. Difficulty while sleeping in the hospital, feels like getting home will help. Feels good otherwise. Ready to be discharged home.  Objective:   VITALS:   Filed Vitals:   03/27/11 0901  BP: 122/73  Pulse: 62  Temp:   Resp:     Neurovascular intact Dorsiflexion/Plantar flexion intact Incision: dressing C/D/I No cellulitis present Compartment soft  LABS  Basename 03/27/11 0410 03/26/11 0545  HGB 8.8* 8.3*  HCT 27.7* 26.4*  WBC 14.4* 10.6*  PLT 324 278     Basename 03/27/11 0410 03/26/11 0545 03/25/11 2050  NA 131* 135 136  K 4.1 4.0 4.1  BUN 11 10 13   CREATININE 1.08 1.03 0.99  GLUCOSE 107* 89 119*     Assessment/Plan: 2 Days Post-Op Procedure(s) (LRB): TOTAL KNEE REVISION (Right)   Up with therapy Discharge home with home health today after PT x2.   [name]   PAC  03/27/2011, 9:59 AM

## 2011-03-27 NOTE — Progress Notes (Signed)
  CARE MANAGEMENT NOTE 03/27/2011  Patient:  [name], [name]   Account Number:  [id number]  Date Initiated:  03/26/2011  Documentation initiated by:  Colleen Can  Subjective/Objective Assessment:   dx infected right total knee replacemnt, s/p resection and multiple incision and drainages; repeat incision and drainage with replacemnt of antibiotic spacer     Action/Plan:   Plans home with [facility] services. Wants o use HH agency that he used last time he was in hospital. Can't remember name now. CM will follow up &research for name of agency   Anticipated DC Date:  03/27/2011   Anticipated DC Plan:  HOME W HOME HEALTH SERVICES  In-house referral  NA      DC Planning Services  CM consult      [facility] Choice  HOME HEALTH   Choice offered to / List presented to:  C-1 Patient   DME arranged  [facility]ELCHAIR - MANUAL      DME agency  [facility]     HH arranged  HH-1 RN  HH-2 PT      [facility] agency  [facility]   Status of service:  Completed, signed off Medicare Important Message given?  NA - LOS <3 / Initial given by admissions (If response is "NO", the following Medicare IM given date fields will be blank) Date Medicare IM given:   Date Additional Medicare IM given:    Discharge Disposition:  HOME W HOME HEALTH SERVICES  Per UR Regulation:    Comments:  03/27/2011 Raynelle Bring BSN CCM [phone] Per Shawn Route note from previous hospital visit, patient was set up with [facility]. Pt advised of HH agency that he has used. He is choosing to use [facility]. [facility] notified of request for services and can service patient. Pt will require HOme IV antibiotics . List of [facility] agencies for choice placed on shadow chart.pT FOR DISCHARGE TODAY WITH hh SERVICES STARTING TODAY

## 2011-03-27 NOTE — Progress Notes (Signed)
Pharmacy Note (Brief)  Pharmacy Consult for Vancomycin  Indication: infected knee prothetic   Pt to be d/c'd today on IV Vanco. Suggest Vanco trough be checked 2/7 or 2/8 prior to giving dose. Suggest SCr and Vanco trough be checked weekly thereafter to maintain Vanco trough 15-20. Further dose adjustments per Home Health agency.  Darrol Angel, PharmD [phone] 03/27/2011 3:08 PM

## 2011-03-27 NOTE — Discharge Summary (Signed)
Physician Discharge Summary  Patient ID: [name] MRN: [id number] DOB/AGE: [birth year] 71 y.o.  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 male with a history of a right total knee arthroplasty and subsequent infection. On November 20, 2010, he underwent a removal of the implant with an I&D and implantation of a antibiotic spacer. He was also on weeks of antibiotics as well to help treat the infection. Pt states that since the last surgery he has continually felt better, and though he has limited ROM he feels that the pain has been decreasing. Recent lab work shows an elevated CRP (22.21) and Sed rate (94). While in the office there was an attempt to aspirate the right knee, which resulted in no aspirate to evaluate. Various options are discussed with the patient. Risks, benefits and expectations were discussed with the patient, including the possibility if the knee is still infected that he may need another spacer and more time on antibiotic. Patient understand the risks, benefits and expectations and wishes to  proceed with surgery.    PCP: Carrie Mew, MD, MD   Discharged Condition: good  Hospital Course:  Patient underwent the above stated procedure on 03/25/2011. Patient tolerated the procedure well and brought to the recovery room in good condition and subsequently to the floor.  POD #1 BP: 106/57 ; Pulse: 80 ; Temp: 98.7 F (37.1 C) ; Resp: 20  Pt's foley was removed, as well as the hemovac drain removed. IV was changed to a saline lock. reports pain as mild. Pt states that he felt anxious with the foley, the pain and the placement of the PICC line. He states that this morning he feels much better and he doesn't feel as if his heart rate has been increasing since about 0200 AM. The major this was the placement of the PICC line that got him worked up. The foley is still bothering him and he would like to get this removed. Otherwise he is feeling good this morning. Neurovascular intact, dorsiflexion/plantar flexion intact, incision: dressing C/D/I, no cellulitis present and compartment soft.  LABS  Basename  03/26/11 0545   HGB  8.3*   HCT  26.4*    POD #2  BP: 122/73 ; Pulse: 62  Patient reports pain as mild. Difficulty while sleeping in the hospital, feels like getting home will help. Feels good otherwise. Ready to be discharged home. Neurovascular intact, dorsiflexion/plantar flexion intact, incision: dressing C/D/I, no cellulitis present and compartment soft.  LABS  Basename  03/27/11 0410    HGB  8.8*    HCT  27.7*  Discharge Exam: Extremities: Homans sign is negative, no sign of DVT, no edema, redness or tenderness in the calves or thighs and no ulcers, gangrene or trophic changes  Disposition: Home-Health Care Svc including PT and RN,  with follow up in 2 weeks.  Follow-up Information    Follow up with OLIN,Chriss Redel D in 2 weeks.   Contact information:   [facility] [address] [place] [address] [phone]            Discharge Orders    Future Appointments: Provider: Department: Dept Phone: Center:   04/01/2011 8:30 AM Carrie Mew, MD [facility] [phone] [facility]     Future Orders Please Complete By Expires   Diet - low sodium heart healthy      Call MD / Call 911      Comments:   If you experience chest pain or shortness of breath, CALL 911 and be transported to the hospital emergency room.  If you develope a fever above 101 F, pus (white drainage) or increased drainage or redness at the wound, or calf pain, call your surgeon's office.   Constipation Prevention      Comments:   Drink plenty of fluids.  Prune juice may be helpful.  You may use a stool softener, such as Colace (over the counter) 100 mg twice a day.  Use MiraLax (over the counter) for constipation as needed.   Increase activity slowly as tolerated      Weight Bearing as taught in Physical Therapy      Comments:   Use a walker or crutches as instructed.   Driving restrictions      Comments:   No driving for 4 weeks   TED hose      Comments:   Use stockings (TED hose) for 2 weeks on both leg(s).  You may remove them at night for sleeping.   Change dressing      Comments:   Dressing changes daily with sterile 4 x 4 inch gauze dressing and tape. Keep the area dry and clean.   Discharge instructions      Comments:   Daily dressing changes with gauze and tape. Keep the area dry and clean until follow up. Follow up in 2 weeks at [facility] Call with any questions or concerns.  HH RN to monitor and adjust vancomycin levels        Current Discharge Medication List    START taking these medications   Details  ALPRAZolam (XANAX) 0.5 MG tablet Take 1 tablet (0.5 mg total) by mouth 3 (three) times daily as needed for anxiety. Qty: 20 tablet, Refills: 0    aspirin EC 325 MG tablet Take 1 tablet (325 mg total) by mouth 2 (two) times daily. X 4 weeks Qty: 60 tablet, Refills: 0    diphenhydrAMINE  (BENADRYL) 25 mg capsule Take 1 capsule (25 mg total) by mouth every 6 (six) hours as needed for itching or allergies.    docusate sodium 100 MG CAPS Take 100 mg by mouth 2 (two) times daily.    ferrous sulfate 325 (65 FE) MG tablet Take 1 tablet (325 mg total) by mouth 3 (three) times daily after meals.    oxyCODONE 10 MG TABS Take 1-2 tablets (10-20 mg total) by mouth every 4 (four) hours. Qty: 120 tablet, Refills: 0    polyethylene glycol (MIRALAX / GLYCOLAX) packet Take 17 g by mouth 2 (two) times daily.    vancomycin (VANCOCIN) 1  GM/200ML SOLN Inject 200 mLs (1,000 mg total) into the vein every 12 (twelve) hours. Qty: 12000 mL, Refills: 0   Comments: To be monitored with trough levels and adjusted through Home Health RN.    zolpidem (AMBIEN) 5 MG tablet Take 1 tablet (5 mg total) by mouth at bedtime as needed for sleep. Qty: 20 tablet, Refills: 0      CONTINUE these medications which have CHANGED   Details  methocarbamol (ROBAXIN) 500 MG tablet Take 1 tablet (500 mg total) by mouth every 6 (six) hours as needed (muscle spasms).      CONTINUE these medications which have NOT CHANGED   Details  bisoprolol (ZEBETA) 5 MG tablet Take 2.5 mg by mouth daily after breakfast.     gabapentin (NEURONTIN) 300 MG capsule Take 300 mg by mouth 3 (three) times daily as needed. Pain     morphine (MS CONTIN) 30 MG 12 hr tablet Take 1 tablet (30 mg total) by mouth 2 (two) times daily. Qty: 60 tablet, Refills: 0    Multiple Vitamins-Minerals (MULTIVITAMINS THER. W/MINERALS) TABS Take 1 tablet by mouth daily.     Phenyleph-CPM-DM-APAP (ALKA-SELTZER PLUS COLD & COUGH) 06-19-08-325 MG CAPS Take 1 capsule by mouth every 4 (four) hours as needed. Congestion     simvastatin (ZOCOR) 40 MG tablet Take 1 tablet (40 mg total) by mouth at bedtime. Qty: 30 tablet, Refills: 6      STOP taking these medications     aspirin 81 MG tablet Comments:  Reason for Stopping:       aspirin 81 MG tablet  Comments:  Reason for Stopping:       oxyCODONE-acetaminophen (PERCOCET) 5-325 MG per tablet Comments:  Reason for Stopping:          Signed: Anastasio Auerbach. Dominik Yordy   PAC  03/27/2011, 10:27 AM

## 2011-03-27 NOTE — Progress Notes (Signed)
Physical Therapy Treatment Patient Details Name: [name] MRN: [id number] DOB: [birth year] Today's Date: 03/27/2011  PT Assessment/Plan  PT - Assessment/Plan Comments on Treatment Session: Pt educated on safe w/c transfer and plans to d/c home after one more PT session today. PT Plan: Discharge plan remains appropriate Follow Up Recommendations: Home health PT;Supervision for mobility/OOB Equipment Recommended: Wheelchair (measurements) Pt will need w/c (standard size), elevated leg rests and drop armrests for mobility at home. PT Goals  Acute Rehab PT Goals PT Goal: Supine/Side to Sit - Progress: Progressing toward goal PT Goal: Sit to Stand - Progress: Progressing toward goal PT Goal: Stand to Sit - Progress: Progressing toward goal PT Goal: Ambulate - Progress: Discontinued (comment) (pt not able with TDWB ) PT Goal: Up/Down Stairs - Progress: Discontinued (comment) Pt will Propel Wheelchair: 10 - 50 feet;with supervision PT Goal: Propel Wheelchair - Progress: Goal set today  PT Treatment Precautions/Restrictions  Precautions Precautions: Knee Required Braces or Orthoses: Yes Knee Immobilizer: Discontinue once straight leg raise with < 10 degree lag Restrictions Weight Bearing Restrictions: Yes RLE Weight Bearing: Touchdown weight bearing Mobility (including Balance) Bed Mobility Bed Mobility: Yes Supine to Sit: 4: Min assist Supine to Sit Details (indicate cue type and reason): OT supported R LE Transfers Transfers: Yes Sit to Stand: 4: Min assist;With upper extremity assist;From chair/3-in-1;With armrests;From bed;Other (comment) (EOB to W/C) Sit to Stand Details (indicate cue type and reason): min/guard, verbal cue for hand placement, performed twice Stand to Sit: 4: Min assist;To bed;With upper extremity assist;With armrests;To chair/3-in-1;Other (comment) Stand to Sit Details: min/guard, verbal cues for hand placement and R LE forward, performed twice Stand Pivot  Transfers: 4: Min assist;With armrests Stand Pivot Transfer Details (indicate cue type and reason): min/guard, verbal cues for safe technique with RW, performed twice: bed to w/c then w/c to recliner Lateral/Scoot Transfers: 4: Min assist Lateral/Scoot Transfer Details (indicate cue type and reason):  minA with first transfer scooting from bed to w/c, increased verbal cues for safe technique, armrest of w/c removed    Exercise    End of Session PT - End of Session Equipment Utilized During Treatment: Right knee immobilizer Activity Tolerance: Patient tolerated treatment well Patient left: in chair;with call bell in reach General Behavior During Session: [facility] for tasks performed Cognition: [facility] for tasks performed  [name],[name] 03/27/2011, 12:19 PM Pager: [phone]

## 2011-03-27 NOTE — Progress Notes (Signed)
Physical Therapy Treatment Patient Details Name: [name] MRN: [id number] DOB: February 01, 1941 Today's Date: 03/27/2011  PT Assessment/Plan  PT - Assessment/Plan Comments on Treatment Session: Pt more confident with bed to w/c transfers. Pt reports he will be going home by ambulance and is having ramp built tomorrow.  Instructed to maintain KI upon return home.  No further questions/concerns about d/c home. PT Plan: Discharge plan remains appropriate Follow Up Recommendations: Home [name] PT;Supervision for mobility/OOB Equipment Recommended: Wheelchair (measurements) Standard w/c with elevated leg rests and drop down armrests PT Goals  Acute Rehab PT Goals PT Goal: Supine/Side to Sit - Progress: Met PT Goal: Sit to Stand - Progress: Progressing toward goal PT Goal: Stand to Sit - Progress: Progressing toward goal PT Goal: Ambulate - Progress: Discontinued (comment) (pt not able with TDWB ) PT Goal: Up/Down Stairs - Progress: Discontinued (comment) Pt will Propel Wheelchair: 10 - 50 feet;with supervision PT Goal: Propel Wheelchair - Progress: Goal set today  PT Treatment Precautions/Restrictions  Precautions Precautions: Knee Required Braces or Orthoses: Yes Knee Immobilizer: Discontinue once straight leg raise with < 10 degree lag Restrictions Weight Bearing Restrictions: Yes RLE Weight Bearing: Touchdown weight bearing Mobility (including Balance) Bed Mobility Bed Mobility: Yes Supine to Sit: 5: Supervision Supine to Sit Details (indicate cue type and reason): verbal cues to support R LE with UEs Sit to Supine: 5: Supervision Transfers Transfers: Yes Sit to Stand: 4: Min assist;With upper extremity assist;From chair/3-in-1;With armrests;From bed;Other (comment) (EOB to W/C) Sit to Stand Details (indicate cue type and reason): min/guard, verbal cues for hand placement Stand to Sit: 4: Min assist;To bed;With upper extremity assist;With armrests;To chair/3-in-1;Other  (comment) Stand to Sit Details: min/guard, verbal cues for hand placement and R LE forward Stand Pivot Transfers: 4: Min assist;With armrests Stand Pivot Transfer Details (indicate cue type and reason): min/guard with RW, bed to w/c transfer performed twice    Exercise    End of Session PT - End of Session Equipment Utilized During Treatment: Right knee immobilizer Activity Tolerance: Patient tolerated treatment well Patient left: with call bell in reach;in bed General Behavior During Session: [name] for tasks performed Cognition: [name] for tasks performed  [name],[name] 03/27/2011, 4:11 PM Pager: [phone]

## 2011-03-28 LAB — BODY FLUID CULTURE

## 2011-03-29 LAB — TYPE AND SCREEN
ABO/RH(D): B POS
Donor AG Type: NEGATIVE
Unit division: 0
Unit division: 0

## 2011-03-30 LAB — ANAEROBIC CULTURE

## 2011-04-01 ENCOUNTER — Encounter: Payer: MEDICARE | Admitting: Internal Medicine

## 2011-04-05 ENCOUNTER — Telehealth: Payer: Self-pay | Admitting: Internal Medicine

## 2011-04-05 MED ORDER — MORPHINE SULFATE CR 30 MG PO TB12
30.0000 mg | ORAL_TABLET | Freq: Two times a day (BID) | ORAL | Status: DC
Start: 2011-04-05 — End: 2011-05-09

## 2011-04-05 NOTE — Telephone Encounter (Signed)
Patient called stating he needs a refill on his morphine. Please assist and inform patient when available.

## 2011-04-05 NOTE — Telephone Encounter (Signed)
Pt's wife informed it is ready for pickup

## 2011-04-09 ENCOUNTER — Encounter (HOSPITAL_COMMUNITY): Payer: Self-pay | Admitting: Orthopedic Surgery

## 2011-04-15 ENCOUNTER — Other Ambulatory Visit: Payer: Self-pay | Admitting: *Deleted

## 2011-04-15 MED ORDER — ALPRAZOLAM 0.5 MG PO TABS: 0.5000 mg | ORAL_TABLET | Freq: Three times a day (TID) | ORAL | Status: AC | PRN

## 2011-04-17 ENCOUNTER — Other Ambulatory Visit: Payer: Self-pay | Admitting: *Deleted

## 2011-04-22 ENCOUNTER — Telehealth: Payer: Self-pay | Admitting: Internal Medicine

## 2011-04-22 NOTE — Telephone Encounter (Signed)
Pt aware of appt on 04/23/11.

## 2011-04-22 NOTE — Telephone Encounter (Signed)
Triage VM:  Pt called back and states that he has been having trouble out of his knees and was put on morphine and oxycodone and it is causing his stomach to be upset.  Pt states his orthopedic doctor gave him promethazine 25 mg and it is not helping.  Pt states he called the orthopedic office back and told them it was not working and was told to call his pcp.

## 2011-04-22 NOTE — Telephone Encounter (Signed)
Pt called complaining of upset stomach,nausea,diarrhea, some constipation. Pt req med to be called in to [facility] [phone]

## 2011-04-22 NOTE — Telephone Encounter (Signed)
See if he can padonda tomorrow

## 2011-04-23 ENCOUNTER — Ambulatory Visit (INDEPENDENT_AMBULATORY_CARE_PROVIDER_SITE_OTHER): Payer: Medicare Other | Admitting: Family

## 2011-04-23 ENCOUNTER — Encounter: Payer: Self-pay | Admitting: Family

## 2011-04-23 VITALS — BP 140/80 | Ht 72.0 in | Wt 173.0 lb

## 2011-04-23 DIAGNOSIS — R300 Dysuria: Secondary | ICD-10-CM

## 2011-04-23 LAB — BASIC METABOLIC PANEL
CO2: 22 mEq/L (ref 19–32)
Calcium: 9.4 mg/dL (ref 8.4–10.5)
Creatinine, Ser: 1.2 mg/dL (ref 0.4–1.5)
GFR: 65.73 mL/min (ref >60.00)
Sodium: 139 mEq/L (ref 135–145)

## 2011-04-23 LAB — POCT URINALYSIS DIPSTICK
Ketones, UA: NEGATIVE
Leukocytes, UA: NEGATIVE
Nitrite, UA: NEGATIVE
Protein, UA: NEGATIVE
Urobilinogen, UA: 0.2
pH, UA: 7

## 2011-04-23 MED ORDER — ONDANSETRON HCL 8 MG PO TABS: 8.0000 mg | ORAL_TABLET | Freq: Three times a day (TID) | ORAL | Status: DC | PRN

## 2011-04-23 NOTE — Patient Instructions (Addendum)
1. Increase intake of fluids.  Nausea, Adult Nausea is the feeling that you have an upset stomach or have to vomit. Nausea by itself is not likely a serious concern, but it may be an early sign of more serious medical problems. As nausea gets worse, it can lead to vomiting. If vomiting develops, there is the risk of dehydration.  CAUSES   Viral infections.   Food poisoning.   Medicines.   Pregnancy.   Motion sickness.   Migraine headaches.   Emotional distress.   Severe pain from any source.   Alcohol intoxication.  HOME CARE INSTRUCTIONS  Get plenty of rest.   Ask your caregiver about specific rehydration instructions.   Eat small amounts of food and sip liquids more often.   Take all medicines as told by your caregiver.  SEEK MEDICAL CARE IF:  You have not improved after 2 days, or you get worse.   You have a headache.  SEEK IMMEDIATE MEDICAL CARE IF:   You have a fever.   You faint.   You keep vomiting or have blood in your vomit.   You are extremely weak or dehydrated.   You have dark or bloody stools.   You have severe chest or abdominal pain.  MAKE SURE YOU:  Understand these instructions.   Will watch your condition.   Will get help right away if you are not doing well or get worse.  Document Released: 03/14/2004 Document Revised: 01/24/2011 Document Reviewed: 10/17/2010 [facility] Patient Information 2012 [place], [place].

## 2011-04-23 NOTE — Progress Notes (Signed)
Subjective:    Patient ID: [name], male    DOB: [birth year], 71 y.o.   MRN: [id number]  HPI 71 year old white male, nonsmoker, patient of Dr. Lovell Sheehan presents today with complaints of nausea and constipation that has been going on for several days. Patient reports increasing his intake of water and his symptoms are better today than they had been. He has been on oxycodone for right knee surgery and believes that the medication has caused him to have constipation. He has since discontinued it. He's currently taking morphine prescribed by Dr. Lovell Sheehan. He continues to have right knee pain rates it 5/10 but describes it as being tolerable.  Additionally his urine checked today because he had mild burning with urination this morning. He has not had any subsequent episodes. Denies any cloudy urine, no blood in his urine, foul-smelling urine back pain, or abdominal pain.   Review of Systems  Constitutional: Negative.   Respiratory: Negative.   Cardiovascular: Negative.   Gastrointestinal: Positive for nausea and constipation. Negative for vomiting and abdominal pain.  Genitourinary: Negative.   Musculoskeletal: Positive for joint swelling.  Skin: Negative.   Neurological: Negative.   Hematological: Negative.   Psychiatric/Behavioral: Negative.        Past Medical History  Diagnosis Date  . Hypertension   . Hyperlipidemia   . Preoperative cardiovascular examination   . Spinal stenosis, lumbar   . CAS (cerebral atherosclerosis)   . Erectile dysfunction   . Preventative health care   . Benign prostatic hypertrophy with urinary obstruction   . Family history of early CAD     male 1st degree relative <50  . Peptic ulcer disease   . Low back pain   . Superficial spreading melanoma   . Sleep apnea, obstructive     CPAP-does not use  . HA (headache)     sinus headaches  . Arthritis     knees  . Myocardial infarction 1997    Hx MI 1997 and 1998  . Anxiety   . Pneumonia 2005    . Anemia   . Depression     takes Zoloft  . Neuropathy, autonomic, idiopathic peripheral, other   . Blood transfusion 1 yr. ago    jerking,fever-after blood transfusion  . Blood transfusion     given wrong blood  . Coronary artery disease 1998    cardiac stent/ Clearance Dr Lovell Sheehan and Jacinto Halim with note on chart    History   Social History  . Marital Status: Married    Spouse Name: N/A    Number of Children: N/A  . Years of Education: N/A   Occupational History  . diesel repair     part time  . worked at [employer] History Main Topics  . Smoking status: Former Smoker -- 1.0 packs/day for 15 years    Types: Cigars    Quit date: 12/23/2009  . Smokeless tobacco: Not on file   Comment: smoked a pipe for 6-7 y ears  . Alcohol Use: No  . Drug Use: No  . Sexually Active: Not on file   Other Topics Concern  . Not on file   Social History Narrative  . No narrative on file    Past Surgical History  Procedure Date  . Esophagogastroduodenoscopy 2004  . Colonoscopy 2004  . Lumbar laminectomy   . Lumbar fusion   . Knee arthroscopy     left  . Shoulder arthroscopy     right  .  Knee arthroscopy     right  . Decompression of the median nerve     left wrist and hand  . Revision of tkr 2011    on left  . Coronary stent placement   . Back surgery 2005-last    lumbar x2  . Excisional total knee arthroplasty 12/25/2010    Procedure: EXCISIONAL TOTAL KNEE ARTHROPLASTY;  Surgeon: Shelda Pal;  Location: [facility];  Service: Orthopedics;  Laterality: Right;  repeat irrigation   and debridement, removal and reinsertation of spacer block  . Coronary angioplasty 1998  . Joint replacement 2011    left knee x 2 ; 08/2010-right knee-infected  . I&d extremity 03/25/2011    Procedure: IRRIGATION AND DEBRIDEMENT EXTREMITY;  Surgeon: Shelda Pal, MD;  Location: [facility];  Service: Orthopedics;  Laterality: Right;    No family history on file.  Allergies  Allergen Reactions   . Acetaminophen     REACTION:LIVER INFLAMMATION IN HIGH DOSES    Current Outpatient Prescriptions on File Prior to Visit  Medication Sig Dispense Refill  . ALPRAZolam (XANAX) 0.5 MG tablet Take 1 tablet (0.5 mg total) by mouth 3 (three) times daily as needed for anxiety.  30 tablet  1  . bisoprolol (ZEBETA) 5 MG tablet Take 2.5 mg by mouth daily after breakfast.       . ferrous sulfate 325 (65 FE) MG tablet Take 1 tablet (325 mg total) by mouth 3 (three) times daily after meals.      . gabapentin (NEURONTIN) 300 MG capsule Take 300 mg by mouth 3 (three) times daily as needed. Pain       . morphine (MS CONTIN) 30 MG 12 hr tablet Take 1 tablet (30 mg total) by mouth 2 (two) times daily.  60 tablet  0  . Multiple Vitamins-Minerals (MULTIVITAMINS THER. W/MINERALS) TABS Take 1 tablet by mouth daily.       Marland Kitchen Phenyleph-CPM-DM-APAP (ALKA-SELTZER PLUS COLD & COUGH) 06-19-08-325 MG CAPS Take 1 capsule by mouth every 4 (four) hours as needed. Congestion       . simvastatin (ZOCOR) 40 MG tablet Take 1 tablet (40 mg total) by mouth at bedtime.  30 tablet  6  . vancomycin (VANCOCIN) 1 GM/200ML SOLN Inject 200 mLs (1,000 mg total) into the vein every 12 (twelve) hours.  12000 mL  0  . zolpidem (AMBIEN) 5 MG tablet Take 1 tablet (5 mg total) by mouth at bedtime as needed for sleep.  20 tablet  0    BP 140/80  Ht 6' (1.829 m)  Wt 173 lb (78.472 kg)  BMI 23.46 kg/m2chart Objective:   Physical Exam  Constitutional: He is oriented to person, place, and time. He appears well-developed and well-nourished.  Neck: Normal range of motion. Neck supple.  Cardiovascular: Normal rate, regular rhythm and normal heart sounds.   Pulmonary/Chest: Effort normal and breath sounds normal.  Abdominal: Soft. Bowel sounds are normal.  Musculoskeletal: Normal range of motion.       Right knee pain  Neurological: He is alert and oriented to person, place, and time.  Skin: Skin is warm and dry.  Psychiatric: He has a  normal mood and affect.          Assessment & Plan:  Assessment: Nausea, constipation, right knee pain  Plan: Labs today to include BMP will notify patient of results. Zofran 8 mg one by mouth every 8 hours when necessary nausea. DC oxycodone continue morphine. Patient to followup with PCP in  specialist as scheduled and sooner when necessary.

## 2011-04-29 ENCOUNTER — Ambulatory Visit (INDEPENDENT_AMBULATORY_CARE_PROVIDER_SITE_OTHER): Payer: Medicare Other | Admitting: Family

## 2011-04-29 ENCOUNTER — Ambulatory Visit (INDEPENDENT_AMBULATORY_CARE_PROVIDER_SITE_OTHER)
Admission: RE | Admit: 2011-04-29 | Discharge: 2011-04-29 | Disposition: A | Discharge status: Home / Self Care | Payer: Medicare Other | Source: Ambulatory Visit | Attending: Family | Admitting: Family

## 2011-04-29 ENCOUNTER — Encounter: Payer: Self-pay | Admitting: Family

## 2011-04-29 DIAGNOSIS — K5900 Constipation, unspecified: Secondary | ICD-10-CM

## 2011-04-29 DIAGNOSIS — R109 Unspecified abdominal pain: Secondary | ICD-10-CM

## 2011-04-29 MED ORDER — POLYETHYLENE GLYCOL 3350 17 GM/SCOOP PO POWD: 17.0000 g | Freq: Two times a day (BID) | ORAL | Status: AC | PRN

## 2011-04-29 MED ORDER — ONDANSETRON 8 MG PO TBDP: 4.0000 mg | ORAL_TABLET | Freq: Three times a day (TID) | ORAL | Status: AC | PRN

## 2011-04-29 NOTE — Patient Instructions (Signed)
1. 1 bottle of magnesium citrate po x 1. 2. Miralax daily  Constipation in Adults Constipation is having fewer than 2 bowel movements per week. Usually, the stools are hard. As we grow older, constipation is more common. If you try to fix constipation with laxatives, the problem may get worse. This is because laxatives taken over a long period of time make the colon muscles weaker. A low-fiber diet, not taking in enough fluids, and taking some medicines may make these problems worse. MEDICATIONS THAT MAY CAUSE CONSTIPATION  Water pills (diuretics).   Calcium channel blockers (used to control blood pressure and for the heart).   Certain pain medicines (narcotics).   Anticholinergics.   Anti-inflammatory agents.   Antacids that contain aluminum.  DISEASES THAT CONTRIBUTE TO CONSTIPATION  Diabetes.   Parkinson's disease.   Dementia.   Stroke.   Depression.   Illnesses that cause problems with salt and water metabolism.  HOME CARE INSTRUCTIONS   Constipation is usually best cared for without medicines. Increasing dietary fiber and eating more fruits and vegetables is the best way to manage constipation.   Slowly increase fiber intake to 25 to 38 grams per day. Whole grains, fruits, vegetables, and legumes are good sources of fiber. A dietitian can further help you incorporate high-fiber foods into your diet.   Drink enough water and fluids to keep your urine clear or pale yellow.   A fiber supplement may be added to your diet if you cannot get enough fiber from foods.   Increasing your activities also helps improve regularity.   Suppositories, as suggested by your caregiver, will also help. If you are using antacids, such as aluminum or calcium containing products, it will be helpful to switch to products containing magnesium if your caregiver says it is okay.   If you have been given a liquid injection (enema) today, this is only a temporary measure. It should not be relied  on for treatment of longstanding (chronic) constipation.   Stronger measures, such as magnesium sulfate, should be avoided if possible. This may cause uncontrollable diarrhea. Using magnesium sulfate may not allow you time to make it to the bathroom.  SEEK IMMEDIATE MEDICAL CARE IF:   There is bright red blood in the stool.   The constipation stays for more than 4 days.   There is belly (abdominal) or rectal pain.   You do not seem to be getting better.   You have any questions or concerns.  MAKE SURE YOU:   Understand these instructions.   Will watch your condition.   Will get help right away if you are not doing well or get worse.  Document Released: 11/03/2003 Document Revised: 01/24/2011 Document Reviewed: 01/08/2011 [facility] Patient Information 2012 [place], [place].

## 2011-04-29 NOTE — Progress Notes (Signed)
Subjective:    Patient ID: [name], male    DOB: 05-14-1940, 71 y.o.   MRN: [id number]  HPI Comments: C/o constipation. Had two rt knee replacements and 3 spacers placed for a total of 5 rt knee surgeries since July and takes oxycodone and morphine for pain. Has not taken stool softners while on narcotics and has experienced nausea x two weeks unrelieved with phenergan or zofran. Denies vomiting or ab pain. "I want to eat and feel hungry yet the nausea is preventing me from eating."      Review of Systems  Constitutional: Negative.   Respiratory: Negative.   Cardiovascular: Negative.   Gastrointestinal: Positive for constipation. Negative for abdominal pain, diarrhea, blood in stool, abdominal distention and anal bleeding.  Genitourinary: Negative.    Past Medical History  Diagnosis Date  . Hypertension   . Hyperlipidemia   . Preoperative cardiovascular examination   . Spinal stenosis, lumbar   . CAS (cerebral atherosclerosis)   . Erectile dysfunction   . Preventative health care   . Benign prostatic hypertrophy with urinary obstruction   . Family history of early CAD     male 1st degree relative <50  . Peptic ulcer disease   . Low back pain   . Superficial spreading melanoma   . Sleep apnea, obstructive     CPAP-does not use  . HA (headache)     sinus headaches  . Arthritis     knees  . Myocardial infarction 1997    Hx MI 1997 and 1998  . Anxiety   . Pneumonia 2005  . Anemia   . Depression     takes Zoloft  . Neuropathy, autonomic, idiopathic peripheral, other   . Blood transfusion 1 yr. ago    jerking,fever-after blood transfusion  . Blood transfusion     given wrong blood  . Coronary artery disease 1998    cardiac stent/ Clearance Dr Lovell Sheehan and Jacinto Halim with note on chart    History   Social History  . Marital Status: Married    Spouse Name: N/A    Number of Children: N/A  . Years of Education: N/A   Occupational History  . diesel repair    part time  . worked at [employer] History Main Topics  . Smoking status: Former Smoker -- 1.0 packs/day for 15 years    Types: Cigars    Quit date: 12/23/2009  . Smokeless tobacco: Not on file   Comment: smoked a pipe for 6-7 y ears  . Alcohol Use: No  . Drug Use: No  . Sexually Active: Not on file   Other Topics Concern  . Not on file   Social History Narrative  . No narrative on file    Past Surgical History  Procedure Date  . Esophagogastroduodenoscopy 2004  . Colonoscopy 2004  . Lumbar laminectomy   . Lumbar fusion   . Knee arthroscopy     left  . Shoulder arthroscopy     right  . Knee arthroscopy     right  . Decompression of the median nerve     left wrist and hand  . Revision of tkr 2011    on left  . Coronary stent placement   . Back surgery 2005-last    lumbar x2  . Excisional total knee arthroplasty 12/25/2010    Procedure: EXCISIONAL TOTAL KNEE ARTHROPLASTY;  Surgeon: Shelda Pal;  Location: [facility];  Service: Orthopedics;  Laterality: Right;  repeat irrigation   and debridement, removal and reinsertation of spacer block  . Coronary angioplasty 1998  . Joint replacement 2011    left knee x 2 ; 08/2010-right knee-infected  . I&d extremity 03/25/2011    Procedure: IRRIGATION AND DEBRIDEMENT EXTREMITY;  Surgeon: Shelda Pal, MD;  Location: [facility];  Service: Orthopedics;  Laterality: Right;    No family history on file.  Allergies  Allergen Reactions  . Acetaminophen     REACTION:LIVER INFLAMMATION IN HIGH DOSES    Current Outpatient Prescriptions on File Prior to Visit  Medication Sig Dispense Refill  . ALPRAZolam (XANAX) 0.5 MG tablet Take 1 tablet (0.5 mg total) by mouth 3 (three) times daily as needed for anxiety.  30 tablet  1  . bisoprolol (ZEBETA) 5 MG tablet Take 2.5 mg by mouth daily after breakfast.       . ferrous sulfate 325 (65 FE) MG tablet Take 1 tablet (325 mg total) by mouth 3 (three) times daily after meals.      .  gabapentin (NEURONTIN) 300 MG capsule Take 300 mg by mouth 3 (three) times daily as needed. Pain       . methocarbamol (ROBAXIN) 500 MG tablet       . morphine (MS CONTIN) 30 MG 12 hr tablet Take 1 tablet (30 mg total) by mouth 2 (two) times daily.  60 tablet  0  . Multiple Vitamins-Minerals (MULTIVITAMINS THER. W/MINERALS) TABS Take 1 tablet by mouth daily.       . ondansetron (ZOFRAN) 8 MG tablet Take 1 tablet (8 mg total) by mouth every 8 (eight) hours as needed for nausea.  20 tablet  0  . Oxycodone HCl 10 MG TABS       . Phenyleph-CPM-DM-APAP (ALKA-SELTZER PLUS COLD & COUGH) 06-19-08-325 MG CAPS Take 1 capsule by mouth every 4 (four) hours as needed. Congestion       . promethazine (PHENERGAN) 25 MG tablet       . simvastatin (ZOCOR) 40 MG tablet Take 1 tablet (40 mg total) by mouth at bedtime.  30 tablet  6  . vancomycin (VANCOCIN) 1 GM/200ML SOLN Inject 200 mLs (1,000 mg total) into the vein every 12 (twelve) hours.  12000 mL  0  . zolpidem (AMBIEN) 5 MG tablet Take 1 tablet (5 mg total) by mouth at bedtime as needed for sleep.  20 tablet  0    BP 148/84  Temp(Src) 98 F (36.7 C) (Oral)  Wt 190 lb (86.183 kg)chart    Objective:   Physical Exam  Constitutional: He is oriented to person, place, and time. He appears well-developed and well-nourished. No distress.  HENT:  Right Ear: External ear normal.  Left Ear: External ear normal.  Nose: Nose normal.  Mouth/Throat: Oropharynx is clear and moist. No oropharyngeal exudate.  Cardiovascular: Normal rate, regular rhythm, normal heart sounds and intact distal pulses.  Exam reveals no gallop and no friction rub.   No murmur heard. Pulmonary/Chest: Effort normal and breath sounds normal. No respiratory distress. He has no wheezes. He has no rales. He exhibits no tenderness.  Abdominal: Soft. Bowel sounds are normal. He exhibits no distension and no mass. There is no tenderness. There is no rebound and no guarding.  Neurological: He is  alert and oriented to person, place, and time.  Skin: Skin is warm and dry. He is not diaphoretic.          Assessment & Plan:  Assessment: Constipation, nausea Plan: KUB,  miralax, magnesium citrate, increase po fluids and consume diet high in fiber, leafy green vegetables and fruits. Teaching handout provided on diagnosis, medications, and diet.

## 2011-04-30 ENCOUNTER — Encounter (HOSPITAL_COMMUNITY): Payer: Self-pay | Admitting: Pharmacy Technician

## 2011-05-03 NOTE — H&P (Signed)
[name] is an 71 y.o. male.    Chief Complaint:  Right knee infection / pain   HPI:  Pt is a 71 y.o. male with a history of a right total knee arthroplasty and subsequent infection. On November 20, 2010, he underwent a removal of the implant with an I&D and implantation of a antibiotic spacer. He was also on weeks of antibiotics as well to help treat the infection. Upon entering the knee on 03/25/2011 cultures were taken which revealed continued infection. The old antibiotic spacer was removed and replaced with a new one.  He again was placed on IV antibiotics. Most recent visit to the clinic the patient was feeling better than he had and looking forward to proceeding with surgery.  Pending the results of a CRP and Sed rate surgery is planned.  Risks, benefits and expectations were discussed with the patient. Patient understand the risks, benefits and expectations and wishes to proceed with surgery.   PCP:  Carrie Mew, MD, MD  D/C Plans:  Home with HHPT  Post-op Meds:   No Rx given  Tranexamic Acid:   Not to be given  PMH: Past Medical History  Diagnosis Date  . Hypertension   . Hyperlipidemia   . Preoperative cardiovascular examination   . Spinal stenosis, lumbar   . CAS (cerebral atherosclerosis)   . Erectile dysfunction   . Preventative health care   . Benign prostatic hypertrophy with urinary obstruction   . Family history of early CAD     male 1st degree relative <50  . Peptic ulcer disease   . Low back pain   . Superficial spreading melanoma   . Sleep apnea, obstructive     CPAP-does not use  . HA (headache)     sinus headaches  . Arthritis     knees  . Myocardial infarction 1997    Hx MI 1997 and 1998  . Anxiety   . Pneumonia 2005  . Anemia   . Depression     takes Zoloft  . Neuropathy, autonomic, idiopathic peripheral, other   . Blood transfusion 1 yr. ago    jerking,fever-after blood transfusion  . Blood transfusion     given wrong blood  .  Coronary artery disease 1998    cardiac stent/ Clearance Dr Lovell Sheehan and Jacinto Halim with note on chart    PSH: Past Surgical History  Procedure Date  . Esophagogastroduodenoscopy 2004  . Colonoscopy 2004  . Lumbar laminectomy   . Lumbar fusion   . Knee arthroscopy     left  . Shoulder arthroscopy     right  . Knee arthroscopy     right  . Decompression of the median nerve     left wrist and hand  . Revision of tkr 2011    on left  . Coronary stent placement   . Back surgery 2005-last    lumbar x2  . Excisional total knee arthroplasty 12/25/2010    Procedure: EXCISIONAL TOTAL KNEE ARTHROPLASTY;  Surgeon: Shelda Pal;  Location: [facility];  Service: Orthopedics;  Laterality: Right;  repeat irrigation   and debridement, removal and reinsertation of spacer block  . Coronary angioplasty 1998  . Joint replacement 2011    left knee x 2 ; 08/2010-right knee-infected  . I&d extremity 03/25/2011    Procedure: IRRIGATION AND DEBRIDEMENT EXTREMITY;  Surgeon: Shelda Pal, MD;  Location: [facility];  Service: Orthopedics;  Laterality: Right;    Social History:  reports that he  quit smoking about 16 months ago. His smoking use included Cigars. He does not have any smokeless tobacco history on file. He reports that he does not drink alcohol or use illicit drugs.  Allergies:  Allergies  Allergen Reactions  . Acetaminophen     REACTION:LIVER INFLAMMATION IN HIGH DOSES    Medications: No current facility-administered medications for this encounter.   Current Outpatient Prescriptions  Medication Sig Dispense Refill  . ALPRAZolam (XANAX) 0.5 MG tablet Take 1 tablet (0.5 mg total) by mouth 3 (three) times daily as needed for anxiety.  30 tablet  1  . bisoprolol (ZEBETA) 5 MG tablet Take 2.5 mg by mouth daily after breakfast.       . methocarbamol (ROBAXIN) 500 MG tablet Take 500 mg by mouth 3 (three) times daily as needed. For muscle pain      . morphine (MS CONTIN) 30 MG 12 hr tablet Take 1 tablet  (30 mg total) by mouth 2 (two) times daily.  60 tablet  0  . ondansetron (ZOFRAN ODT) 8 MG disintegrating tablet Take 0.5-1 tablets (4-8 mg total) by mouth every 8 (eight) hours as needed for nausea.  20 tablet  0  . Oxycodone HCl 10 MG TABS Take 10 mg by mouth every 4 (four) hours as needed. For breakthrough pain      . Phenyleph-CPM-DM-APAP (ALKA-SELTZER PLUS COLD & COUGH) 06-19-08-325 MG CAPS Take 1 capsule by mouth every 4 (four) hours as needed. Congestion       . polyethylene glycol powder (GLYCOLAX/MIRALAX) powder Take 17 g by mouth 2 (two) times daily as needed.  3350 g  1  . simvastatin (ZOCOR) 40 MG tablet Take 1 tablet (40 mg total) by mouth at bedtime.  30 tablet  6    ROS: Review of Systems  Constitutional: Negative.   HENT: Negative.   Eyes: Negative.   Respiratory: Negative.   Cardiovascular: Negative.   Gastrointestinal: Negative.   Genitourinary: Negative.   Musculoskeletal: Positive for myalgias and joint pain.  Skin: Negative.   Neurological: Negative.   Endo/Heme/Allergies: Negative.   Psychiatric/Behavioral: Negative.      Physican Exam: BP: 124/71 ; HR: 86 ; Resp: 16 Physical Exam  Constitutional: He is oriented to person, place, and time and well-developed, well-nourished, and in no distress.  HENT:  Head: Normocephalic and atraumatic.  Nose: Nose normal.  Mouth/Throat: Oropharynx is clear and moist.  Eyes: Pupils are equal, round, and reactive to light.  Neck: Neck supple. No JVD present. No tracheal deviation present. No thyromegaly present.  Cardiovascular: Normal rate and intact distal pulses.   Murmur heard. Pulmonary/Chest: Effort normal and breath sounds normal. No stridor.  Abdominal: Soft. There is no tenderness. There is no guarding.  Musculoskeletal:       Right knee: He exhibits decreased range of motion (unable to move, knee at about 5-10 degrees of flexion), swelling, deformity (enlargement from spacer) and bony tenderness. He exhibits no  ecchymosis.  Lymphadenopathy:    He has no cervical adenopathy.  Neurological: He is alert and oriented to person, place, and time.  Skin: Skin is warm and dry.  Psychiatric: Affect normal.     Assessment/Plan Assessment:  Right knee infection  Plan: Patient will undergo a removal of antibiotic spacer with implantation of a right total knee revision on 05/13/2011 per Dr. Charlann Boxer at [facility] . Risks benefits and expectation were discussed with the patient. Patient understand risks, benefits and expectation and wishes to proceed.   Molli Hazard  S. Cassy Sprowl   PAC  05/03/2011, 3:03 PM

## 2011-05-06 ENCOUNTER — Encounter (HOSPITAL_COMMUNITY): Payer: Self-pay

## 2011-05-06 ENCOUNTER — Other Ambulatory Visit: Payer: Self-pay

## 2011-05-06 ENCOUNTER — Encounter (HOSPITAL_COMMUNITY)
Admission: RE | Admit: 2011-05-06 | Discharge: 2011-05-06 | Disposition: A | Discharge status: Home / Self Care | Payer: Medicare Other | Source: Ambulatory Visit | Attending: Orthopedic Surgery | Admitting: Orthopedic Surgery

## 2011-05-06 ENCOUNTER — Other Ambulatory Visit (HOSPITAL_COMMUNITY): Payer: Medicare Other

## 2011-05-06 HISTORY — DX: Cardiac arrhythmia, unspecified: I49.9

## 2011-05-06 HISTORY — DX: Bradycardia, unspecified: R00.1

## 2011-05-06 LAB — CBC
HCT: 35.5 % — ABNORMAL LOW (ref 39.0–52.0)
Hemoglobin: 11 g/dL — ABNORMAL LOW (ref 13.0–17.0)
MCV: 83.7 fL (ref 78.0–100.0)
RBC: 4.24 MIL/uL (ref 4.22–5.81)
RDW: 16.1 % — ABNORMAL HIGH (ref 11.5–15.5)
WBC: 9.1 10*3/uL (ref 4.0–10.5)

## 2011-05-06 LAB — URINALYSIS, ROUTINE W REFLEX MICROSCOPIC
Glucose, UA: NEGATIVE mg/dL
Leukocytes, UA: NEGATIVE
Protein, ur: NEGATIVE mg/dL
Specific Gravity, Urine: 1.015 (ref 1.005–1.030)
Urobilinogen, UA: 1 mg/dL (ref 0.0–1.0)

## 2011-05-06 LAB — DIFFERENTIAL
Basophils Absolute: 0.1 10*3/uL (ref 0.0–0.1)
Lymphocytes Relative: 27 % (ref 12–46)
Lymphs Abs: 2.5 10*3/uL (ref 0.7–4.0)
Monocytes Absolute: 0.7 10*3/uL (ref 0.1–1.0)
Neutro Abs: 5.3 10*3/uL (ref 1.7–7.7)

## 2011-05-06 LAB — BASIC METABOLIC PANEL
CO2: 27 mEq/L (ref 19–32)
Chloride: 103 mEq/L (ref 96–112)
Creatinine, Ser: 1.16 mg/dL (ref 0.50–1.35)
GFR calc Af Amer: 71 mL/min — ABNORMAL LOW (ref >90)
Potassium: 4.7 mEq/L (ref 3.5–5.1)
Sodium: 139 mEq/L (ref 135–145)

## 2011-05-06 LAB — SURGICAL PCR SCREEN
MRSA, PCR: NEGATIVE
Staphylococcus aureus: NEGATIVE

## 2011-05-06 LAB — APTT: aPTT: 35 seconds (ref 24–37)

## 2011-05-06 MED ORDER — CHLORHEXIDINE GLUCONATE 4 % EX LIQD: 60.0000 mL | Freq: Once | CUTANEOUS | Status: DC

## 2011-05-06 MED ORDER — CEFAZOLIN SODIUM 1-5 GM-% IV SOLN: 1.0000 g | INTRAVENOUS | Status: DC

## 2011-05-06 NOTE — Progress Notes (Signed)
05/06/11 1224  OBSTRUCTIVE SLEEP APNEA  Have you ever been diagnosed with sleep apnea through a sleep study? Yes  If yes, do you have and use a CPAP or BPAP machine every night? 0  Do you snore loudly (loud enough to be heard through closed doors)?  0  Do you often feel tired, fatigued, or sleepy during the daytime? 1  Has anyone observed you stop breathing during your sleep? 1  Do you have, or are you being treated for high blood pressure? 1  BMI more than 35 kg/m2? 0  Age over 76 years old? 1  Neck circumference greater than 40 cm/18 inches? 0  Gender: 1  Obstructive Sleep Apnea Score 5   Score 4 or greater  Updated health history

## 2011-05-06 NOTE — Pre-Procedure Instructions (Signed)
05/06/11 dynamap x 2 unable to read B/P on different arms.  Manual blood pressure obtained. 152/80.  PT voices no complaints.  Pt states hx of irregular heart rhythm  Noted irregularity with pulse.  Obtained 12 lead EKG on preop visit.  12 lead EKG showed sinus rhythm with AV dissociaton and accelerated junctional rhythm with premature supraventricular complexes.  Requested most recent EKG from office of Dr Jacinto Halim.  Pt's last visit in 12/12.  Received from office.  Faxed 12 lead done today to Dr Jacinto Halim to have him look at.  Dr Jacinto Halim 's nurse called back and stated DR Jacinto Halim had looked over EKG done today and stated pt had hx of atrial fib.  No change noted on EKG.  Pt okay to leave preop visit.  Pt given information .  Pt seems anxious over several things.  Reassured pt.  Wife and pt left to obtain lunch.   Pt voiced no complaints.

## 2011-05-06 NOTE — Pre-Procedure Instructions (Signed)
06/21/2008 Nuclear stress on chart  LOV note ( DR Jacinto Halim - cardiology ) from 02/01/11 on chart  EKG done 01/30/11 on chart

## 2011-05-06 NOTE — Patient Instructions (Addendum)
20 [name]  05/06/2011   Your procedure is scheduled on:  05/13/11 0730am-0930am  Report to [facility] at 0530 AM.  Call this number if you have problems the morning of surgery: [phone]   Remember:   Do not eat food:After Midnight.  May have clear liquids:until Midnight . Marland Kitchen  Take these medicines the morning of surgery with A SIP OF WATER:  Xanax if needed, MS Contin, Bisoprolol, Robaxin if needed ( pt given handwritten med instructions )    Do not wear jewelry,   Do not wear lotions, powders, or perfumes.    Do not bring valuables to the hospital.  Contacts, dentures or bridgework may not be worn into surgery.  Leave suitcase in the car. After surgery it may be brought to your room.  For patients admitted to the hospital, checkout time is 11:00 AM the day of discharge.      Special Instructions: CHG Shower Use Special Wash: 1/2 bottle night before surgery and 1/2 bottle morning of surgery. shower chin to toes with CHG.  Wash face and private parts with regular soap.    Please read over the following fact sheets that you were given: MRSA Information, coughing and deep breathing exercises, leg exercises, Incentive Spirometry Fact Sheet, Blood Transfusion Fact Sheet

## 2011-05-09 ENCOUNTER — Telehealth: Payer: Self-pay | Admitting: Internal Medicine

## 2011-05-09 MED ORDER — MORPHINE SULFATE CR 30 MG PO TB12: 30.0000 mg | ORAL_TABLET | Freq: Two times a day (BID) | ORAL | Status: DC

## 2011-05-09 NOTE — Telephone Encounter (Signed)
PRINTED AND AWAITING SIGNATURE- WILL CALL PT WHEN READY

## 2011-05-09 NOTE — Telephone Encounter (Signed)
Pt requesting refill on morphine (MS CONTIN) 30 MG 12 hr tablet.   °

## 2011-05-13 ENCOUNTER — Encounter (HOSPITAL_COMMUNITY): Payer: Self-pay | Admitting: Anesthesiology

## 2011-05-13 ENCOUNTER — Encounter (HOSPITAL_COMMUNITY)
Admission: RE | Disposition: A | Discharge status: Home Health | Payer: Self-pay | Source: Ambulatory Visit | Attending: Orthopedic Surgery

## 2011-05-13 ENCOUNTER — Ambulatory Visit (HOSPITAL_COMMUNITY): Payer: Medicare Other | Admitting: Anesthesiology

## 2011-05-13 ENCOUNTER — Encounter (HOSPITAL_COMMUNITY): Payer: Self-pay | Admitting: *Deleted

## 2011-05-13 ENCOUNTER — Inpatient Hospital Stay (HOSPITAL_COMMUNITY)
Admission: RE | Admit: 2011-05-13 | Discharge: 2011-05-16 | DRG: 470 | Disposition: A | Discharge status: Home Health | Payer: Medicare Other | Source: Ambulatory Visit | Attending: Orthopedic Surgery | Admitting: Orthopedic Surgery

## 2011-05-13 DIAGNOSIS — Z01812 Encounter for preprocedural laboratory examination: Secondary | ICD-10-CM

## 2011-05-13 DIAGNOSIS — I2510 Atherosclerotic heart disease of native coronary artery without angina pectoris: Secondary | ICD-10-CM | POA: Diagnosis present

## 2011-05-13 DIAGNOSIS — M21869 Other specified acquired deformities of unspecified lower leg: Secondary | ICD-10-CM | POA: Diagnosis present

## 2011-05-13 DIAGNOSIS — G4733 Obstructive sleep apnea (adult) (pediatric): Secondary | ICD-10-CM | POA: Diagnosis present

## 2011-05-13 DIAGNOSIS — E785 Hyperlipidemia, unspecified: Secondary | ICD-10-CM | POA: Diagnosis present

## 2011-05-13 DIAGNOSIS — Z9861 Coronary angioplasty status: Secondary | ICD-10-CM

## 2011-05-13 DIAGNOSIS — I10 Essential (primary) hypertension: Secondary | ICD-10-CM | POA: Diagnosis present

## 2011-05-13 DIAGNOSIS — Z79899 Other long term (current) drug therapy: Secondary | ICD-10-CM

## 2011-05-13 DIAGNOSIS — F3289 Other specified depressive episodes: Secondary | ICD-10-CM | POA: Diagnosis present

## 2011-05-13 DIAGNOSIS — F411 Generalized anxiety disorder: Secondary | ICD-10-CM | POA: Diagnosis present

## 2011-05-13 DIAGNOSIS — Z96659 Presence of unspecified artificial knee joint: Secondary | ICD-10-CM

## 2011-05-13 DIAGNOSIS — F329 Major depressive disorder, single episode, unspecified: Secondary | ICD-10-CM | POA: Diagnosis present

## 2011-05-13 DIAGNOSIS — Z4789 Encounter for other orthopedic aftercare: Principal | ICD-10-CM

## 2011-05-13 DIAGNOSIS — Z87891 Personal history of nicotine dependence: Secondary | ICD-10-CM

## 2011-05-13 DIAGNOSIS — Z01810 Encounter for preprocedural cardiovascular examination: Secondary | ICD-10-CM

## 2011-05-13 DIAGNOSIS — I252 Old myocardial infarction: Secondary | ICD-10-CM

## 2011-05-13 DIAGNOSIS — Z89529 Acquired absence of unspecified knee: Secondary | ICD-10-CM

## 2011-05-13 DIAGNOSIS — D62 Acute posthemorrhagic anemia: Secondary | ICD-10-CM

## 2011-05-13 DIAGNOSIS — Z981 Arthrodesis status: Secondary | ICD-10-CM

## 2011-05-13 DIAGNOSIS — G609 Hereditary and idiopathic neuropathy, unspecified: Secondary | ICD-10-CM | POA: Diagnosis present

## 2011-05-13 HISTORY — PX: TOTAL KNEE REVISION: SHX996

## 2011-05-13 SURGERY — TOTAL KNEE REVISION
Anesthesia: General | Site: Knee | Laterality: Right | Wound class: Clean

## 2011-05-13 MED ORDER — FERROUS SULFATE 325 (65 FE) MG PO TABS
325.0000 mg | ORAL_TABLET | Freq: Three times a day (TID) | ORAL | Status: DC
  Administered 2011-05-13 – 2011-05-16 (×9): 325 mg via ORAL
  Filled 2011-05-13 (×9): qty 1

## 2011-05-13 MED ORDER — MIDAZOLAM HCL 5 MG/5ML IJ SOLN
INTRAMUSCULAR | Status: DC | PRN
  Administered 2011-05-13: 2 mg via INTRAVENOUS

## 2011-05-13 MED ORDER — HYDROMORPHONE HCL PF 1 MG/ML IJ SOLN
INTRAMUSCULAR | Status: AC
  Administered 2011-05-13: 0.5 mg via INTRAVENOUS
  Filled 2011-05-13: qty 1

## 2011-05-13 MED ORDER — ALPRAZOLAM 0.5 MG PO TABS
0.5000 mg | ORAL_TABLET | Freq: Three times a day (TID) | ORAL | Status: DC | PRN
  Administered 2011-05-13 – 2011-05-16 (×6): 0.5 mg via ORAL
  Filled 2011-05-13 (×6): qty 1

## 2011-05-13 MED ORDER — KETOROLAC TROMETHAMINE 15 MG/ML IJ SOLN
15.0000 mg | Freq: Four times a day (QID) | INTRAMUSCULAR | Status: AC
  Administered 2011-05-13 – 2011-05-15 (×8): 15 mg via INTRAVENOUS
  Filled 2011-05-13 (×7): qty 1

## 2011-05-13 MED ORDER — HYDROMORPHONE HCL PF 1 MG/ML IJ SOLN
INTRAMUSCULAR | Status: DC | PRN
  Administered 2011-05-13: 1 mg via INTRAVENOUS

## 2011-05-13 MED ORDER — METOCLOPRAMIDE HCL 10 MG PO TABS: 5.0000 mg | ORAL_TABLET | Freq: Three times a day (TID) | ORAL | Status: DC | PRN

## 2011-05-13 MED ORDER — FLEET ENEMA 7-19 GM/118ML RE ENEM: 1.0000 | ENEMA | Freq: Once | RECTAL | Status: AC | PRN

## 2011-05-13 MED ORDER — BISACODYL 5 MG PO TBEC: 5.0000 mg | DELAYED_RELEASE_TABLET | Freq: Every day | ORAL | Status: DC | PRN

## 2011-05-13 MED ORDER — CEFAZOLIN SODIUM-DEXTROSE 2-3 GM-% IV SOLR
2.0000 g | Freq: Once | INTRAVENOUS | Status: AC
  Administered 2011-05-13: 2 g via INTRAVENOUS

## 2011-05-13 MED ORDER — ONDANSETRON HCL 4 MG/2ML IJ SOLN: 4.0000 mg | Freq: Four times a day (QID) | INTRAMUSCULAR | Status: DC | PRN

## 2011-05-13 MED ORDER — BUPIVACAINE-EPINEPHRINE 0.25% -1:200000 IJ SOLN
INTRAMUSCULAR | Status: AC
  Filled 2011-05-13: qty 1

## 2011-05-13 MED ORDER — CEFAZOLIN SODIUM-DEXTROSE 2-3 GM-% IV SOLR
2.0000 g | Freq: Four times a day (QID) | INTRAVENOUS | Status: AC
  Administered 2011-05-13 – 2011-05-14 (×3): 2 g via INTRAVENOUS
  Filled 2011-05-13 (×3): qty 50

## 2011-05-13 MED ORDER — ROPIVACAINE HCL 5 MG/ML IJ SOLN
INTRAMUSCULAR | Status: AC
  Filled 2011-05-13: qty 30

## 2011-05-13 MED ORDER — PROMETHAZINE HCL 25 MG/ML IJ SOLN: 6.2500 mg | INTRAMUSCULAR | Status: DC | PRN

## 2011-05-13 MED ORDER — LACTATED RINGERS IV SOLN
INTRAVENOUS | Status: DC | PRN
  Administered 2011-05-13: 07:00:00 via INTRAVENOUS

## 2011-05-13 MED ORDER — ZOLPIDEM TARTRATE 5 MG PO TABS: 5.0000 mg | ORAL_TABLET | Freq: Every evening | ORAL | Status: DC | PRN

## 2011-05-13 MED ORDER — PHENOL 1.4 % MT LIQD
1.0000 | OROMUCOSAL | Status: DC | PRN
  Filled 2011-05-13: qty 177

## 2011-05-13 MED ORDER — NEOSTIGMINE METHYLSULFATE 1 MG/ML IJ SOLN
INTRAMUSCULAR | Status: DC | PRN
  Administered 2011-05-13: 4 mg via INTRAVENOUS

## 2011-05-13 MED ORDER — LIDOCAINE HCL (CARDIAC) 20 MG/ML IV SOLN
INTRAVENOUS | Status: DC | PRN
  Administered 2011-05-13: 80 mg via INTRAVENOUS

## 2011-05-13 MED ORDER — METHOCARBAMOL 100 MG/ML IJ SOLN
500.0000 mg | Freq: Four times a day (QID) | INTRAMUSCULAR | Status: DC | PRN
  Administered 2011-05-13 – 2011-05-14 (×3): 500 mg via INTRAVENOUS
  Filled 2011-05-13 (×3): qty 5

## 2011-05-13 MED ORDER — ONDANSETRON HCL 4 MG PO TABS: 4.0000 mg | ORAL_TABLET | Freq: Four times a day (QID) | ORAL | Status: DC | PRN

## 2011-05-13 MED ORDER — LACTATED RINGERS IV SOLN
INTRAVENOUS | Status: DC
  Administered 2011-05-13: 12:00:00 via INTRAVENOUS

## 2011-05-13 MED ORDER — MENTHOL 3 MG MT LOZG
1.0000 | LOZENGE | OROMUCOSAL | Status: DC | PRN
  Filled 2011-05-13: qty 9

## 2011-05-13 MED ORDER — SODIUM CHLORIDE 0.9 % IV SOLN
INTRAVENOUS | Status: DC
  Administered 2011-05-13 – 2011-05-14 (×2): via INTRAVENOUS
  Filled 2011-05-13 (×11): qty 1000

## 2011-05-13 MED ORDER — DOCUSATE SODIUM 100 MG PO CAPS
100.0000 mg | ORAL_CAPSULE | Freq: Two times a day (BID) | ORAL | Status: DC
  Administered 2011-05-13 – 2011-05-16 (×6): 100 mg via ORAL
  Filled 2011-05-13 (×6): qty 1

## 2011-05-13 MED ORDER — BISOPROLOL FUMARATE 5 MG PO TABS
2.5000 mg | ORAL_TABLET | Freq: Every day | ORAL | Status: DC
  Administered 2011-05-14: 0.5 mg via ORAL
  Administered 2011-05-15 – 2011-05-16 (×2): 2.5 mg via ORAL
  Filled 2011-05-13 (×3): qty 0.5

## 2011-05-13 MED ORDER — ALUM & MAG HYDROXIDE-SIMETH 200-200-20 MG/5ML PO SUSP
30.0000 mL | ORAL | Status: DC | PRN
  Administered 2011-05-14 (×2): 30 mL via ORAL
  Filled 2011-05-13 (×2): qty 30

## 2011-05-13 MED ORDER — SIMVASTATIN 40 MG PO TABS
40.0000 mg | ORAL_TABLET | Freq: Every day | ORAL | Status: DC
  Administered 2011-05-13 – 2011-05-15 (×3): 40 mg via ORAL
  Filled 2011-05-13 (×4): qty 1

## 2011-05-13 MED ORDER — FENTANYL CITRATE 0.05 MG/ML IJ SOLN
INTRAMUSCULAR | Status: DC | PRN
  Administered 2011-05-13 (×5): 50 ug via INTRAVENOUS

## 2011-05-13 MED ORDER — POLYETHYLENE GLYCOL 3350 17 G PO PACK
17.0000 g | PACK | Freq: Two times a day (BID) | ORAL | Status: DC
  Administered 2011-05-13 – 2011-05-16 (×6): 17 g via ORAL
  Filled 2011-05-13 (×6): qty 1

## 2011-05-13 MED ORDER — HYDROMORPHONE HCL PF 1 MG/ML IJ SOLN
0.5000 mg | INTRAMUSCULAR | Status: DC | PRN
  Filled 2011-05-13 (×2): qty 1

## 2011-05-13 MED ORDER — GLYCOPYRROLATE 0.2 MG/ML IJ SOLN
INTRAMUSCULAR | Status: DC | PRN
  Administered 2011-05-13: .6 mg via INTRAVENOUS
  Administered 2011-05-13: 0.2 mg via INTRAVENOUS

## 2011-05-13 MED ORDER — SODIUM CHLORIDE 0.9 % IR SOLN
Status: DC | PRN
  Administered 2011-05-13: 1000 mL

## 2011-05-13 MED ORDER — METHOCARBAMOL 500 MG PO TABS
500.0000 mg | ORAL_TABLET | Freq: Four times a day (QID) | ORAL | Status: DC | PRN
  Administered 2011-05-13 – 2011-05-16 (×6): 500 mg via ORAL
  Filled 2011-05-13 (×7): qty 1

## 2011-05-13 MED ORDER — ROCURONIUM BROMIDE 100 MG/10ML IV SOLN
INTRAVENOUS | Status: DC | PRN
  Administered 2011-05-13: 40 mg via INTRAVENOUS

## 2011-05-13 MED ORDER — MORPHINE SULFATE CR 30 MG PO TB12
30.0000 mg | ORAL_TABLET | Freq: Two times a day (BID) | ORAL | Status: DC
  Administered 2011-05-13 – 2011-05-16 (×6): 30 mg via ORAL
  Filled 2011-05-13 (×6): qty 1

## 2011-05-13 MED ORDER — METOCLOPRAMIDE HCL 5 MG/ML IJ SOLN: 5.0000 mg | Freq: Three times a day (TID) | INTRAMUSCULAR | Status: DC | PRN

## 2011-05-13 MED ORDER — OXYCODONE HCL 5 MG PO TABS
10.0000 mg | ORAL_TABLET | ORAL | Status: DC
  Administered 2011-05-13 (×3): 10 mg via ORAL
  Administered 2011-05-14: 20 mg via ORAL
  Administered 2011-05-14 (×3): 10 mg via ORAL
  Administered 2011-05-14 (×2): 20 mg via ORAL
  Administered 2011-05-15 (×2): 10 mg via ORAL
  Administered 2011-05-15 (×2): 20 mg via ORAL
  Administered 2011-05-15: 10 mg via ORAL
  Administered 2011-05-15 – 2011-05-16 (×5): 20 mg via ORAL
  Filled 2011-05-13: qty 2
  Filled 2011-05-13 (×2): qty 4
  Filled 2011-05-13 (×3): qty 2
  Filled 2011-05-13 (×4): qty 4
  Filled 2011-05-13: qty 2
  Filled 2011-05-13: qty 4
  Filled 2011-05-13: qty 2
  Filled 2011-05-13: qty 4
  Filled 2011-05-13: qty 2
  Filled 2011-05-13 (×2): qty 4
  Filled 2011-05-13 (×2): qty 2

## 2011-05-13 MED ORDER — PROPOFOL 10 MG/ML IV EMUL
INTRAVENOUS | Status: DC | PRN
  Administered 2011-05-13: 200 mg via INTRAVENOUS

## 2011-05-13 MED ORDER — EPHEDRINE SULFATE 50 MG/ML IJ SOLN
INTRAMUSCULAR | Status: DC | PRN
  Administered 2011-05-13 (×3): 5 mg via INTRAVENOUS
  Administered 2011-05-13: 10 mg via INTRAVENOUS
  Administered 2011-05-13 (×2): 5 mg via INTRAVENOUS

## 2011-05-13 MED ORDER — ONDANSETRON HCL 4 MG/2ML IJ SOLN
INTRAMUSCULAR | Status: DC | PRN
  Administered 2011-05-13: 4 mg via INTRAVENOUS

## 2011-05-13 MED ORDER — RIVAROXABAN 10 MG PO TABS
10.0000 mg | ORAL_TABLET | ORAL | Status: DC
  Administered 2011-05-14 – 2011-05-16 (×3): 10 mg via ORAL
  Filled 2011-05-13 (×3): qty 1

## 2011-05-13 MED ORDER — DIPHENHYDRAMINE HCL 25 MG PO CAPS
25.0000 mg | ORAL_CAPSULE | Freq: Four times a day (QID) | ORAL | Status: DC | PRN
  Administered 2011-05-15: 25 mg via ORAL
  Filled 2011-05-13: qty 1

## 2011-05-13 MED ORDER — CEFAZOLIN SODIUM 1-5 GM-% IV SOLN
INTRAVENOUS | Status: AC
  Filled 2011-05-13: qty 100

## 2011-05-13 MED ORDER — ROPIVACAINE HCL 5 MG/ML IJ SOLN
INTRAMUSCULAR | Status: DC | PRN
  Administered 2011-05-13: 30 mL

## 2011-05-13 MED ORDER — KETOROLAC TROMETHAMINE 30 MG/ML IJ SOLN
INTRAMUSCULAR | Status: AC
  Filled 2011-05-13: qty 1

## 2011-05-13 MED ORDER — HYDROMORPHONE HCL PF 1 MG/ML IJ SOLN
0.2500 mg | INTRAMUSCULAR | Status: DC | PRN
  Administered 2011-05-13 (×3): 0.5 mg via INTRAVENOUS

## 2011-05-13 MED ORDER — KETOROLAC TROMETHAMINE 15 MG/ML IJ SOLN
INTRAMUSCULAR | Status: AC
  Administered 2011-05-13: 15 mg via INTRAVENOUS
  Filled 2011-05-13: qty 1

## 2011-05-13 SURGICAL SUPPLY — 77 items
ADAPTER BOLT FEMORAL +2/-2 (Knees) ×1 IMPLANT
ADPR FEM +2/-2 OFST BOLT (Knees) ×1 IMPLANT
ADPR FEM 5D STRL KN PFC SGM (Orthopedic Implant) ×1 IMPLANT
AUG FEM SZ5 8 CMB POST STRL LF (Orthopedic Implant) ×2 IMPLANT
AUG FEM SZ5 8 STRL LF KN RT TI (Knees) ×2 IMPLANT
AUGMENT DIST PFC SIGMA 8MM RT (Knees) IMPLANT
AUGMENT POST 5 8 (Orthopedic Implant) ×2 IMPLANT
BAG SPEC THK2 15X12 ZIP CLS (MISCELLANEOUS) ×1
BAG ZIPLOCK 12X15 (MISCELLANEOUS) ×2 IMPLANT
BANDAGE ELASTIC 6 VELCRO ST LF (GAUZE/BANDAGES/DRESSINGS) ×2 IMPLANT
BANDAGE ESMARK 6X9 LF (GAUZE/BANDAGES/DRESSINGS) ×1 IMPLANT
BIT DRILL 2.4X128 (BIT) ×1 IMPLANT
BLADE SAW SGTL 81X20 HD (BLADE) ×2 IMPLANT
BNDG CMPR 9X6 STRL LF SNTH (GAUZE/BANDAGES/DRESSINGS) ×1
BNDG ESMARK 6X9 LF (GAUZE/BANDAGES/DRESSINGS) ×2
BONE CEMENT GENTAMICIN (Cement) ×8 IMPLANT
BOWL SMART MIX CTS (DISPOSABLE) ×1 IMPLANT
BRUSH FEMORAL CANAL (MISCELLANEOUS) ×1 IMPLANT
CEMENT BONE GENTAMICIN 40 (Cement) IMPLANT
CLOTH BEACON ORANGE TIMEOUT ST (SAFETY) ×2 IMPLANT
CUFF TOURN SGL QUICK 34 (TOURNIQUET CUFF) ×2
CUFF TRNQT CYL 34X4X40X1 (TOURNIQUET CUFF) ×1 IMPLANT
DIS AUG PFC SIGMA 8MM RIGHT (Knees) ×4 IMPLANT
DRAPE EXTREMITY T 121X128X90 (DRAPE) ×2 IMPLANT
DRAPE POUCH INSTRU U-SHP 10X18 (DRAPES) ×2 IMPLANT
DRAPE U-SHAPE 47X51 STRL (DRAPES) ×2 IMPLANT
DRSG PAD ABDOMINAL 8X10 ST (GAUZE/BANDAGES/DRESSINGS) ×3 IMPLANT
DURAPREP 26ML APPLICATOR (WOUND CARE) ×2 IMPLANT
ELECT REM PT RETURN 9FT ADLT (ELECTROSURGICAL) ×2
ELECTRODE REM PT RTRN 9FT ADLT (ELECTROSURGICAL) ×1 IMPLANT
EVACUATOR 1/8 PVC DRAIN (DRAIN) ×2 IMPLANT
FACESHIELD LNG OPTICON STERILE (SAFETY) ×10 IMPLANT
FEM TC3 PFC SIGMA SZ5 (Orthopedic Implant) ×2 IMPLANT
FEMORAL ADAPTER (Orthopedic Implant) ×1 IMPLANT
FEMORAL TC3 PFC SIGMA SZ5 (Orthopedic Implant) IMPLANT
GAUZE XEROFORM 5X9 LF (GAUZE/BANDAGES/DRESSINGS) ×2 IMPLANT
GLOVE BIOGEL PI IND STRL 7.5 (GLOVE) ×1 IMPLANT
GLOVE BIOGEL PI IND STRL 8 (GLOVE) ×2 IMPLANT
GLOVE BIOGEL PI INDICATOR 7.5 (GLOVE) ×1
GLOVE BIOGEL PI INDICATOR 8 (GLOVE) ×2
GLOVE ORTHO TXT STRL SZ7.5 (GLOVE) ×4 IMPLANT
GLOVE SURG ORTHO 8.0 STRL STRW (GLOVE) ×2 IMPLANT
GOWN BRE IMP PREV XXLGXLNG (GOWN DISPOSABLE) ×2 IMPLANT
GOWN STRL NON-REIN LRG LVL3 (GOWN DISPOSABLE) ×2 IMPLANT
HANDPIECE INTERPULSE COAX TIP (DISPOSABLE) ×2
IMMOBILIZER KNEE 20 (SOFTGOODS) ×4
IMMOBILIZER KNEE 20 THIGH 36 (SOFTGOODS) IMPLANT
KIT BASIN OR (CUSTOM PROCEDURE TRAY) ×2 IMPLANT
MANIFOLD NEPTUNE II (INSTRUMENTS) ×2 IMPLANT
NDL SAFETY ECLIPSE 18X1.5 (NEEDLE) ×1 IMPLANT
NEEDLE HYPO 18GX1.5 SHARP (NEEDLE)
NS IRRIG 1000ML POUR BTL (IV SOLUTION) ×2 IMPLANT
PACK TOTAL JOINT (CUSTOM PROCEDURE TRAY) ×2 IMPLANT
PADDING CAST COTTON 6X4 STRL (CAST SUPPLIES) ×4 IMPLANT
PATELLA DOME PFC 41MM (Knees) ×1 IMPLANT
PLATE ROT INSERT 10MM SIZE 5 (Plate) ×1 IMPLANT
POSITIONER SURGICAL ARM (MISCELLANEOUS) ×2 IMPLANT
RESTRICTOR CEMENT SZ 5 C-STEM (Cement) ×2 IMPLANT
SET HNDPC FAN SPRY TIP SCT (DISPOSABLE) ×1 IMPLANT
SET PAD KNEE POSITIONER (MISCELLANEOUS) ×2 IMPLANT
SLEEVE TIB MBT 53 (Knees) ×1 IMPLANT
SPONGE GAUZE 4X4 12PLY (GAUZE/BANDAGES/DRESSINGS) ×3 IMPLANT
SPONGE LAP 18X18 X RAY DECT (DISPOSABLE) ×3 IMPLANT
STAPLER VISISTAT 35W (STAPLE) ×1 IMPLANT
STEM TIBIA PFC 13X60MM (Stem) ×2 IMPLANT
SUCTION FRAZIER 12FR DISP (SUCTIONS) ×2 IMPLANT
SUT ETHILON 2 0 PS N (SUTURE) ×2 IMPLANT
SUT VIC AB 1 CT1 36 (SUTURE) ×7 IMPLANT
SUT VIC AB 2-0 CT1 27 (SUTURE) ×6
SUT VIC AB 2-0 CT1 TAPERPNT 27 (SUTURE) ×3 IMPLANT
SYR 50ML LL SCALE MARK (SYRINGE) ×1 IMPLANT
TOWEL OR 17X26 10 PK STRL BLUE (TOWEL DISPOSABLE) ×4 IMPLANT
TOWER CARTRIDGE SMART MIX (DISPOSABLE) ×2 IMPLANT
TRAY FOLEY CATH 14FRSI W/METER (CATHETERS) ×2 IMPLANT
TRAY TIB SZ 5 REVISION (Knees) ×1 IMPLANT
WATER STERILE IRR 1500ML POUR (IV SOLUTION) ×2 IMPLANT
WRAP KNEE MAXI GEL POST OP (GAUZE/BANDAGES/DRESSINGS) ×2 IMPLANT

## 2011-05-13 NOTE — Anesthesia Postprocedure Evaluation (Signed)
  Anesthesia Post-op Note  Patient: [name]  Procedure(s) Performed: Procedure(s) (LRB): TOTAL KNEE REVISION (Right)  Patient Location: PACU  Anesthesia Type: GA combined with regional for post-op pain  Level of Consciousness: awake and alert   Airway and Oxygen Therapy: Patient Spontanous Breathing  Post-op Pain: mild  Post-op Assessment: Post-op Vital signs reviewed, Patient's Cardiovascular Status Stable, Respiratory Function Stable, Patent Airway and No signs of Nausea or vomiting  Post-op Vital Signs: stable  Complications: No apparent anesthesia complications

## 2011-05-13 NOTE — Transfer of Care (Signed)
Immediate Anesthesia Transfer of Care Note  Patient: [name]  Procedure(s) Performed: Procedure(s) (LRB): TOTAL KNEE REVISION (Right)  Patient Location: PACU  Anesthesia Type: General  Level of Consciousness: awake, alert , oriented and patient cooperative  Airway & Oxygen Therapy: Patient Spontanous Breathing and Patient connected to face mask oxygen  Post-op Assessment: Report given to PACU RN and Post -op Vital signs reviewed and stable  Post vital signs: Reviewed and stable  Complications: No apparent anesthesia complications

## 2011-05-13 NOTE — Anesthesia Procedure Notes (Signed)
Anesthesia Regional Block:  Femoral nerve block  Pre-Anesthetic Checklist: ,, timeout performed, Correct Patient, Correct Site, Correct Laterality, Correct Procedure, Correct Position, site marked, Risks and benefits discussed,  Surgical consent,  Pre-op evaluation,  At surgeon's request and post-op pain management  Laterality: Right and Lower  Prep: chloraprep       Needles:  Injection technique: Single-shot  Needle Type: Stimiplex      Needle Gauge: 21 G    Additional Needles:  Procedures: ultrasound guided and nerve stimulator Femoral nerve block Narrative:   Performed by: Personally  Anesthesiologist: Machele Deihl  Additional Notes: No pain on injection. No increased resistance to injection. Motor intact immediately after block. Loss of quadriceps strength at 20 minutes.   Femoral nerve block

## 2011-05-13 NOTE — Brief Op Note (Signed)
05/13/2011  11:02 AM  PATIENT:  [name]  71 y.o. male  PRE-OPERATIVE DIAGNOSIS:  status post resection and Irrigation and debridement of infected right total knee   POST-OPERATIVE DIAGNOSIS:  status post resection and Irrigation and debridement of infected right total knee   PROCEDURE:  Procedure(s) (LRB): Reimplantation of Right TKR (Revision) TOTAL KNEE REVISION (Right)  SURGEON:  Surgeon(s) and Role:    * Shelda Pal, MD - Primary  PHYSICIAN ASSISTANT: Lanney Gins, PA-C  ANESTHESIA:   Regional FNB and GET  EBL:  Total I/O In: 2800 [I.V.:2800] Out: 700 [Urine:500; Blood:200]  BLOOD ADMINISTERED:none  DRAINS: (1 medium) Hemovact drain(s) in the right knee with  Suction Open   LOCAL MEDICATIONS USED:  NONE  SPECIMEN:  No Specimen  DISPOSITION OF SPECIMEN:  N/A  COUNTS:  YES  TOURNIQUET:   Total Tourniquet Time Documented: Thigh (Right) - 87 minutes  DICTATION: .Other Dictation: Dictation Number [id number].  PLAN OF CARE: Admit to inpatient   PATIENT DISPOSITION:  PACU - hemodynamically stable.   Delay start of Pharmacological VTE agent (>24hrs) due to surgical blood loss or risk of bleeding: no

## 2011-05-13 NOTE — Anesthesia Preprocedure Evaluation (Addendum)
Anesthesia Evaluation  Patient identified by MRN, date of birth, ID band Patient awake    Reviewed: Allergy & Precautions, H&P , NPO status , Patient's Chart, lab work & pertinent test results  Airway Mallampati: II TM Distance: >3 FB Neck ROM: Full    Dental No notable dental hx.    Pulmonary sleep apnea , pneumonia ,  breath sounds clear to auscultation  Pulmonary exam normal       Cardiovascular hypertension, Pt. on medications and Pt. on home beta blockers + CAD and + Past MI + dysrhythmias Atrial Fibrillation Rhythm:Regular Rate:Normal     Neuro/Psych  Headaches, PSYCHIATRIC DISORDERS Anxiety Depression  Neuromuscular disease    GI/Hepatic Neg liver ROS, PUD,   Endo/Other  negative endocrine ROS  Renal/GU negative Renal ROS  negative genitourinary   Musculoskeletal negative musculoskeletal ROS (+)   Abdominal   Peds negative pediatric ROS (+)  Hematology negative hematology ROS (+)   Anesthesia Other Findings   Reproductive/Obstetrics negative OB ROS                           Anesthesia Physical Anesthesia Plan  ASA: III  Anesthesia Plan: General   Post-op Pain Management:    Induction: Intravenous  Airway Management Planned:   Additional Equipment:   Intra-op Plan:   Post-operative Plan: Extubation in OR  Informed Consent: I have reviewed the patients History and Physical, chart, labs and discussed the procedure including the risks, benefits and alternatives for the proposed anesthesia with the patient or authorized representative who has indicated his/her understanding and acceptance.   Dental advisory given  Plan Discussed with: CRNA  Anesthesia Plan Comments: (Discussed risks of femoral nerve block including failure, bleeding, infection, nerve damage.  Femoral nerve block does not usually prevent all pain.  Questions answered.  Patient consents to block. )         Anesthesia Quick Evaluation

## 2011-05-13 NOTE — Interval H&P Note (Signed)
History and Physical Interval Note:  05/13/2011 7:12 AM  Cameron Ali  has presented today for surgery, with the diagnosis of status post resection and Irrigation and debridement of infected right total knee   The various methods of treatment have been discussed with the patient and family. After consideration of risks, benefits and other options for treatment, the patient has consented to  Procedure(s) (LRB): RE-IMPLANTATION of RIGHT TKR, REVISION RIGHT TKR TOTAL KNEE REVISION (Right) as a surgical intervention .  The patients' history has been reviewed, patient examined, no change in status, stable for surgery.  I have reviewed the patients' chart and labs.  Questions were answered to the patient's satisfaction.     Shelda Pal

## 2011-05-13 NOTE — Progress Notes (Signed)
Utilization review completed.  

## 2011-05-13 NOTE — Preoperative (Signed)
Beta Blockers   Reason not to administer Beta Blockers:Pt took Bisoprolol this am 05-13-11 at 0445

## 2011-05-14 LAB — CBC
HCT: 24.5 % — ABNORMAL LOW (ref 39.0–52.0)
MCH: 26 pg (ref 26.0–34.0)
MCHC: 31 g/dL (ref 30.0–36.0)
MCV: 83.9 fL (ref 78.0–100.0)
Platelets: 156 10*3/uL (ref 150–400)
RDW: 15.9 % — ABNORMAL HIGH (ref 11.5–15.5)
WBC: 6.9 10*3/uL (ref 4.0–10.5)

## 2011-05-14 LAB — BASIC METABOLIC PANEL
BUN: 17 mg/dL (ref 6–23)
Calcium: 8.3 mg/dL — ABNORMAL LOW (ref 8.4–10.5)
Creatinine, Ser: 1.2 mg/dL (ref 0.50–1.35)
GFR calc Af Amer: 68 mL/min — ABNORMAL LOW (ref >90)
GFR calc non Af Amer: 59 mL/min — ABNORMAL LOW (ref >90)

## 2011-05-14 NOTE — Progress Notes (Signed)
Patient ID: [name], male   DOB: May 17, 1940, 71 y.o.   MRN: [id number] Subjective: 1 Day Post-Op Procedure(s) (LRB): TOTAL KNEE REVISION (Right)    Patient reports pain as moderate, but seems comfortable this am.  Happy that new knee in place  Objective:   VITALS:   Filed Vitals:   05/14/11 0509  BP: 127/74  Pulse: 92  Temp: 98.3 F (36.8 C)  Resp: 16    Neurovascular intact Incision: dressing C/D/I  LABS  Basename 05/14/11 0418  HGB 7.6*  HCT 24.5*  WBC 6.9  PLT 156     Basename 05/14/11 0418  NA 136  K 4.5  BUN 17  CREATININE 1.20  GLUCOSE 107*    No results found for this basename: LABPT:2,INR:2 in the last 72 hours   Assessment/Plan: 1 Day Post-Op Procedure(s) (LRB): TOTAL KNEE REVISION (Right)   Advance diet Up with therapy Plan for discharge tomorrow  Will keep HV drain in place until tomorrow am Pain control

## 2011-05-14 NOTE — Op Note (Signed)
NAMETYREL, LEX NO.:  [id number]  MEDICAL RECORD NO.:  [id number]  LOCATION:  1621                         FACILITY:  [facility]  PHYSICIAN:  Madlyn Frankel. Charlann Boxer, M.D.  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  PROCEDURE:  Re-implantation of right total knee replacement.  COMPONENTS USE:  DePuy rotating platform, posterior stabilized knee system, revision system using a size 5 TC3 femur, 8 mm augments on both the distal medial and lateral aspect of the knee as well as the posterior medial and lateral aspect of the knee.  A 5-degree bolt with +2 adapter and a 13 x 60 mm cemented stem.  The tibia size was a size 5 MBT revision tray with a 52 mm press-fit sleeve with a 13 x 60 mm stem cemented and a 10-mm insert, a 41 patellar button, and that the insert was a posterior stabilized insert.  SURGEON:  Madlyn Frankel. Charlann Boxer, M.D.  ASSISTANT:  Lanney Gins, PA-C.  Note that [name] was present for the entirety of the case from preoperative positioning, perioperative leg management, taking this channels and revision cage, general facilitation of the case as well as primary wound closure.  ANESTHESIA:  General plus a preoperative regional femoral nerve block.  SPECIMENS:  None.  His joint fluid was normal and preoperative workup was negative.  DRAINS:  One medium Hemovac.  TOURNIQUET TIME:  87 minutes at 250 mmHg.  INDICATIONS FOR PROCEDURE:  Mr. Maclaughlin is a 71 year old patient with history of right total knee replacement, approximately about 6 months ago.  This is unfortunately complicated by persistent wound drainage and subsequent  resection arthroplasty.  After a few different multiple I and D's, I tried incentive spirometer on the skin.  He finally had primary wound healing.  I have followed his sedimentation rate and CRP values with the most recent change from his sedimentation rate from 94 to 12 and CRP value from 22 to 1.7.  Given his overall appearance, his wound appearance as well as the lab values, I felt that he at this point was free of any infection.  Consent was obtained for benefit of pain relief. The risks of recurrent infection, possible need for amputation, DVT, as well as the risk for wound healing were all reviewed and discussed at length.  PROCEDURE IN DETAIL:  The patient was brought to the operative theater. Once adequate anesthesia, preoperative antibiotics, Ancef 2 g administered; the patient was positioned supine.  Right thigh tourniquet was placed.  The right lower extremity was then prepped and draped in sterile fashion with his right foot placed in DeMayo leg holder.  A time- out was performed.  Leg was exsanguinated, tourniquet elevated to 250 mmHg.  His old incision was incised and a very small eschar on the distal third of the incision which I removed.  Soft tissue planes were created and noted  significant scarring in this plane as well as intra-articularly.  An arthrotomy was made utilizing a quadriceps snip technique.  After removal of the synovium, the suprapatellar cement sheared in addition to some remaining cement blocks in the knee, evaluated the remaining bone stubs from previous procedures.  After using a Cobb elevator to remove the significant scarring from the knee, first attended to the canals of the femur and the tibia.  The canals were opened and reamed up to 15 mm, and debrided.  The proximal tibia was noted to be a size 5.  There was significant loss of bone on the medial aspect of the knee with relatively intact lateral compartment.  Initially, I did a trial  reduction with 29 cemented sleeve 5 mm medial and lateral augments that is resting on the posterior and lateral aspect of the knee, but had excellent overall alignment in the coronal and sagittal planes.  With a 60-mm cemented stem, this augment and the tray in place, I used this as my perpendicular as I confirmed its orientation.  Following this, the attention was now directed to the femur with a 13-mm stem and the reamer in place and this 18-mm sleeve and aperture.  I revisited the distal cut, the bone had been lost and felt 8 mm back on both the medial aspects of the knee compared to the epicondylar bone which was remained intact and I used this reference for joint line purposes.  I elected to use 8 mm augments on the medial and lateral distal aspect of the knee.  Following this, we placed a size 5 cutting block.  No bone was removed off the anterior aspect of the femur, none off the posterior medial, and only a small bone posterior lateral.  We ended up needing 8 mm augments medially, laterally, and posteriorly based on these cuts.  TC3 box cut was made.  At this point, a trial reduction was carried out with a size 5 TC3 femur, 13 x 60 mm stem, and augments noted.  With the 10-mm insert, the knee came to full extension with stable and intact medial and lateral collateral ligaments.  At this point, all trial components removed.  We revisited the patella removing some remaining bone cement and redrilled for lug holes for a 41 patellar button.  The final components were opened.  Cement restrictors were placed in the distal femur and proximal tibia, and measured appropriately off the trial components.  We re-irrigated the canals and re-irrigated the knee while the final components were opened on the back table and configured on the back table.  Please note that when I decided to scale with a press-fit, proximal tibial sleeve as apposed to cement based on the bone defect on  the medial aspect of the knee, I felt that there would be some unsupported cement in this area, and that any potential strain could cause fracture and loosening.  I broached up to 52 sleeve press-fit stem which left the tibial component on the same difference as the 5 mm augments.  This was important for balance on the knee.  I did do a trial reduction with this in place.  Once these components were configured on the back table, and the knee irrigated, I placed cement into the proximal tibia and the distal portion of the reamed area for positioning of the cemented stem.  The tibial component was then impacted into position in appropriate depth. There was noted to be some vertical crack in the proximal tibia,  for which this will be addressed and weightbearing status will be noted at the end of the case.  The femoral component was then cemented in place and the canal as well as distal femur and the knee was brought to extension with a 10-mm insert.  The patellar component was cemented into place.  Given all these findings, the cement was allowed to cure.  Once the cement had fully cured and excess cement was removed throughout the knee, the final 10-mm posterior stabilized insert was chosen.  At this point, our attention was directed at this wound closure due to the importance of skin and coverage.  At this point, I reapproximated the extensor mechanism in slight flexion using #1 Vicryl.  This includes the quadriceps snip repair.  The remainder of the wound at this point was closed with 2-0 Vicryl, and then the area where there was previous eschar chose to use the nylon sutures in a vertical mattress orientation to dry the wound, and a better chance of healing the skin.  Staples were used on the remainder of the skin that was more supple.  The knee was cleaned, dried, and dressed sterilely using the Xeroform, a bulky sterile dressing.  Postoperatively, our plan for the patient  to have gentle CPM use for probably 2 months to try to improve some motion.  Given the fact that his integument is the most important thing from my standpoint, at this point of infection, I would let him take it easy over the next 2-3 weeks, and then work on motion from there.  We will get his wound to heal without complications and prevent further infection, I think that would be our primary goal.  Following this, we will continue to use the CPM for 2 months to provide improved motion.  I would like him to be partial weightbearing for at least 4-6 weeks to make certain there is no complications from the vertical split in the proximal tibia.  The patient will be in the hospital for approximately 2 days before going home.     Madlyn Frankel Charlann Boxer, M.D.     MDO/MEDQ  D:  05/13/2011  T:  05/14/2011  Job:  [id number]

## 2011-05-14 NOTE — Evaluation (Signed)
Physical Therapy Evaluation Patient Details Name: [name] MRN: [id number] DOB: Aug 12, 1940 Today's Date: 05/14/2011  Problem List:  Patient Active Problem List  Diagnoses  . Coronary artery disease  . Hyperlipidemia  . Pre-operative cardiovascular examination, high risk surgery  . MELANOMA, SPREADING, SUPERFICIAL  . HYPERLIPIDEMIA  . ERECTILE DYSFUNCTION  . SLEEP APNEA, OBSTRUCTIVE, MODERATE  . OTHER IDIOPATHIC PERIPHERAL AUTONOMIC NEUROPATHY  . HYPERTENSION  . CORONARY ARTERY DISEASE  . CEREBRAL ATHEROSCLEROSIS  . PNEUMONIA  . PEPTIC ULCER DISEASE  . BENIGN PROSTATIC HYPERTROPHY, WITH URINARY OBSTRUCTION  . ACTINIC KERATOSIS  . KNEE PAIN, ACUTE  . SPINAL STENOSIS OF LUMBAR REGION  . LOW BACK PAIN  . CALF PAIN, LEFT  . HEADACHE  . SYMPTOM, NOCTURIA  . SPRAIN&STRAIN OF UNSPECIFIED SITE OF KNEE&LEG  . 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 S/P right TK revision    Past Medical History:  Past Medical History  Diagnosis Date  . Hypertension   . Hyperlipidemia   . Preoperative cardiovascular examination   . Spinal stenosis, lumbar   . CAS (cerebral atherosclerosis)   . Erectile dysfunction   . Preventative health care   . Benign prostatic hypertrophy with urinary obstruction   . Family history of early CAD     male 1st degree relative <50  . Peptic ulcer disease   . Low back pain   . Superficial spreading melanoma   . Sleep apnea, obstructive     CPAP-does not use  . HA (headache)     sinus headaches  . Arthritis     knees  . Myocardial infarction 1997    Hx MI 1997 and 1998  . Anxiety   . Pneumonia 2005  . Anemia   . Depression     takes Zoloft  . Neuropathy, autonomic, idiopathic peripheral, other   . Blood transfusion 1 yr. ago    jerking,fever-after blood transfusion  . Blood transfusion     given wrong blood  . Coronary artery disease 1998    cardiac stent/ Clearance Dr Lovell Sheehan and Jacinto Halim with note on chart  . Dysrhythmia     hx  of atrial fib per ov note of 12/12   . Bradycardia     hx of bradycardia in past per ov note of 12/12   Past Surgical History:  Past Surgical History  Procedure Date  . Esophagogastroduodenoscopy 2004  . Colonoscopy 2004  . Lumbar laminectomy   . Lumbar fusion   . Knee arthroscopy     left  . Shoulder arthroscopy     right  . Knee arthroscopy     right  . Decompression of the median nerve     left wrist and hand  . Revision of tkr 2011    on left  . Coronary stent placement   . Back surgery 2005-last    lumbar x2  . Excisional total knee arthroplasty 12/25/2010    Procedure: EXCISIONAL TOTAL KNEE ARTHROPLASTY;  Surgeon: Shelda Pal;  Location: [facility];  Service: Orthopedics;  Laterality: Right;  repeat irrigation   and debridement, removal and reinsertation of spacer block  . Joint replacement 2011    left knee x 2 ; 08/2010-right knee-infected  . I&d extremity 03/25/2011    Procedure: IRRIGATION AND DEBRIDEMENT EXTREMITY;  Surgeon: Shelda Pal, MD;  Location: [facility];  Service: Orthopedics;  Laterality: Right;  . Coronary angioplasty 1998    stents 1998     PT Assessment/Plan/Recommendation PT Assessment Clinical  Impression Statement: Pt with R TKR revision presents with decreased R LE strength/ROM and ltd functional mobility PT Recommendation/Assessment: Patient will need skilled PT in the acute care venue PT Problem List: Decreased strength;Decreased range of motion;Decreased activity tolerance;Decreased mobility;Decreased knowledge of use of DME;Pain PT Therapy Diagnosis : Difficulty walking PT Plan PT Frequency: 7X/week PT Recommendation Recommendations for Other Services: OT consult Follow Up Recommendations: Home health PT;Skilled nursing facility (dependent on acute stay progress and level of assist at home) Equipment Recommended: None recommended by OT PT Goals  Acute Rehab PT Goals PT Goal Formulation: With patient Time For Goal Achievement: 7 days Pt will  go Supine/Side to Sit: with supervision PT Goal: Supine/Side to Sit - Progress: Goal set today Pt will go Sit to Supine/Side: with supervision PT Goal: Sit to Supine/Side - Progress: Goal set today Pt will go Sit to Stand: with supervision PT Goal: Sit to Stand - Progress: Goal set today Pt will go Stand to Sit: with supervision PT Goal: Stand to Sit - Progress: Goal set today Pt will Ambulate: 51 - 150 feet;with supervision;with rolling walker PT Goal: Ambulate - Progress: Goal set today Pt will Go Up / Down Stairs: 3-5 stairs;with min assist;with least restrictive assistive device PT Goal: Up/Down Stairs - Progress: Goal set today  PT Evaluation Precautions/Restrictions  Precautions Precautions: Knee Required Braces or Orthoses: Yes Knee Immobilizer: Discontinue once straight leg raise with < 10 degree lag Restrictions Weight Bearing Restrictions: Yes RLE Weight Bearing: Partial weight bearing RLE Partial Weight Bearing Percentage or Pounds: 50 Prior Functioning  Home Living Lives With: Spouse Receives Help From: Family Type of Home: House Home Layout: Two level;Able to live on main level with bedroom/bathroom Home Access: Stairs to enter Entrance Stairs-Rails: Right;Left;Can reach both Entrance Stairs-Number of Steps: 4 at garage, 2 at back door. Bathroom Shower/Tub: Engineer, manufacturing systems: Standard Home Adaptive Equipment: Tub transfer bench;Walker - rolling;Bedside commode/3-in-1 Prior Function Level of Independence: Independent with basic ADLs;Requires assistive device for independence;Needs assistance with homemaking Meal Prep: Total Light Housekeeping: Total Able to Take Stairs?: Yes Driving: Yes Vocation: Retired Producer, television/film/video: Awake/alert Overall Cognitive Status: Appears within functional limits for tasks assessed Orientation Level: Oriented X4 Sensation/Coordination Coordination Gross Motor Movements are Fluid and  Coordinated: Yes Extremity Assessment RUE Assessment RUE Assessment: Within Functional Limits LUE Assessment LUE Assessment: Within Functional Limits RLE Assessment RLE Assessment: Exceptions to [facility] (3-/5 quads; -10 ext; flex NT pending clarification by dr) Roslynn Amble Assessment LLE Assessment: Within Functional Limits Mobility (including Balance) Bed Mobility Bed Mobility: Yes Supine to Sit: 4: Min assist Supine to Sit Details (indicate cue type and reason): cues for sequence and use of UEs to self assist Transfers Transfers: Yes Sit to Stand: From chair/3-in-1;With upper extremity assist;With armrests (VCs for hand placement and LE position) Sit to Stand Details (indicate cue type and reason): cues for use of UEs and for LE management Stand to Sit: Other (comment);To chair/3-in-1;With upper extremity assist;With armrests (VCs for hand placement and LE position) Ambulation/Gait Ambulation/Gait: Yes Ambulation/Gait Assistance: 4: Min assist Ambulation/Gait Assistance Details (indicate cue type and reason): cues for posture, position from RW, sequence and PWB Ambulation Distance (Feet): 50 Feet Assistive device: Rolling walker Gait Pattern: Step-to pattern    Exercise  Total Joint Exercises Ankle Circles/Pumps: AROM;10 reps;Supine;Both Quad Sets: AROM;10 reps;Supine;Both Straight Leg Raises: 10 reps;Right;AAROM;Supine;AROM End of Session PT - End of Session Equipment Utilized During Treatment: Right knee immobilizer Activity Tolerance: Patient tolerated treatment well Patient left: in  chair;with call bell in reach Nurse Communication: Mobility status for transfers;Mobility status for ambulation General Behavior During Session: [facility] for tasks performed Cognition: [facility] for tasks performed  Louden Houseworth 05/14/2011, 3:33 PM

## 2011-05-14 NOTE — Progress Notes (Signed)
Physical Therapy Treatment Patient Details Name: [name] MRN: [id number] DOB: 07-12-1940 Today's Date: 05/14/2011  PT Assessment/Plan  PT - Assessment/Plan Comments on Treatment Session: Pt is thrilled with new knee - states it feels so much better already PT Plan: Discharge plan remains appropriate PT Frequency: 7X/week Recommendations for Other Services: OT consult Follow Up Recommendations: Home health PT;Skilled nursing facility Equipment Recommended: None recommended by OT PT Goals  Acute Rehab PT Goals PT Goal Formulation: With patient Time For Goal Achievement: 7 days Pt will go Supine/Side to Sit: with supervision PT Goal: Supine/Side to Sit - Progress: Goal set today Pt will go Sit to Supine/Side: with supervision PT Goal: Sit to Supine/Side - Progress: Progressing toward goal Pt will go Sit to Stand: with supervision PT Goal: Sit to Stand - Progress: Progressing toward goal Pt will go Stand to Sit: with supervision PT Goal: Stand to Sit - Progress: Progressing toward goal Pt will Ambulate: 51 - 150 feet;with supervision;with rolling walker PT Goal: Ambulate - Progress: Progressing toward goal Pt will Go Up / Down Stairs: 3-5 stairs;with min assist;with least restrictive assistive device PT Goal: Up/Down Stairs - Progress: Goal set today  PT Treatment Precautions/Restrictions  Precautions Precautions: Knee Required Braces or Orthoses: Yes Knee Immobilizer: Discontinue once straight leg raise with < 10 degree lag Restrictions Weight Bearing Restrictions: Yes RLE Weight Bearing: Partial weight bearing RLE Partial Weight Bearing Percentage or Pounds: 50 Mobility (including Balance) Bed Mobility Bed Mobility: Yes Supine to Sit: 4: Min assist Supine to Sit Details (indicate cue type and reason): cues for sequence and use of UEs to self assist Sit to Supine: 4: Min assist Sit to Supine - Details (indicate cue type and reason): cues for sequence with min asisst  for R LE Transfers Transfers: Yes Sit to Stand: From chair/3-in-1;With upper extremity assist;With armrests (VCs for hand placement and LE position) Sit to Stand Details (indicate cue type and reason): cues for use of UEs and for LE management Stand to Sit: Other (comment);To chair/3-in-1;With upper extremity assist;With armrests (VCs for hand placement and LE position) Stand to Sit Details: cues for use of UEs and for LE management Ambulation/Gait Ambulation/Gait: Yes Ambulation/Gait Assistance: 4: Min assist Ambulation/Gait Assistance Details (indicate cue type and reason): cues for posture, position from RW, sequence and PWB Ambulation Distance (Feet): 144 Feet Assistive device: Rolling walker Gait Pattern: Step-to pattern    End of Session PT - End of Session Equipment Utilized During Treatment: Right knee immobilizer Activity Tolerance: Patient tolerated treatment well Patient left: with call bell in reach;in bed Nurse Communication: Mobility status for transfers;Mobility status for ambulation General Behavior During Session: [facility] for tasks performed Cognition: [facility] for tasks performed  Fantasy Donald 05/14/2011, 3:40 PM

## 2011-05-14 NOTE — Evaluation (Signed)
Occupational Therapy Evaluation Patient Details Name: [name] MRN: [id number] DOB: Jun 13, 1940 Today's Date: 05/14/2011  Problem List:  Patient Active Problem List  Diagnoses  . Coronary artery disease  . Hyperlipidemia  . Pre-operative cardiovascular examination, high risk surgery  . MELANOMA, SPREADING, SUPERFICIAL  . HYPERLIPIDEMIA  . ERECTILE DYSFUNCTION  . SLEEP APNEA, OBSTRUCTIVE, MODERATE  . OTHER IDIOPATHIC PERIPHERAL AUTONOMIC NEUROPATHY  . HYPERTENSION  . CORONARY ARTERY DISEASE  . CEREBRAL ATHEROSCLEROSIS  . PNEUMONIA  . PEPTIC ULCER DISEASE  . BENIGN PROSTATIC HYPERTROPHY, WITH URINARY OBSTRUCTION  . ACTINIC KERATOSIS  . KNEE PAIN, ACUTE  . SPINAL STENOSIS OF LUMBAR REGION  . LOW BACK PAIN  . CALF PAIN, LEFT  . HEADACHE  . SYMPTOM, NOCTURIA  . SPRAIN&STRAIN OF UNSPECIFIED SITE OF KNEE&LEG  . 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 S/P right TK revision    Past Medical History:  Past Medical History  Diagnosis Date  . Hypertension   . Hyperlipidemia   . Preoperative cardiovascular examination   . Spinal stenosis, lumbar   . CAS (cerebral atherosclerosis)   . Erectile dysfunction   . Preventative health care   . Benign prostatic hypertrophy with urinary obstruction   . Family history of early CAD     male 1st degree relative <50  . Peptic ulcer disease   . Low back pain   . Superficial spreading melanoma   . Sleep apnea, obstructive     CPAP-does not use  . HA (headache)     sinus headaches  . Arthritis     knees  . Myocardial infarction 1997    Hx MI 1997 and 1998  . Anxiety   . Pneumonia 2005  . Anemia   . Depression     takes Zoloft  . Neuropathy, autonomic, idiopathic peripheral, other   . Blood transfusion 1 yr. ago    jerking,fever-after blood transfusion  . Blood transfusion     given wrong blood  . Coronary artery disease 1998    cardiac stent/ Clearance Dr Lovell Sheehan and Jacinto Halim with note on chart  . Dysrhythmia     hx of atrial fib per ov note of 12/12   . Bradycardia     hx of bradycardia in past per ov note of 12/12   Past Surgical History:  Past Surgical History  Procedure Date  . Esophagogastroduodenoscopy 2004  . Colonoscopy 2004  . Lumbar laminectomy   . Lumbar fusion   . Knee arthroscopy     left  . Shoulder arthroscopy     right  . Knee arthroscopy     right  . Decompression of the median nerve     left wrist and hand  . Revision of tkr 2011    on left  . Coronary stent placement   . Back surgery 2005-last    lumbar x2  . Excisional total knee arthroplasty 12/25/2010    Procedure: EXCISIONAL TOTAL KNEE ARTHROPLASTY;  Surgeon: Shelda Pal;  Location: [facility];  Service: Orthopedics;  Laterality: Right;  repeat irrigation   and debridement, removal and reinsertation of spacer block  . Joint replacement 2011    left knee x 2 ; 08/2010-right knee-infected  . I&d extremity 03/25/2011    Procedure: IRRIGATION AND DEBRIDEMENT EXTREMITY;  Surgeon: Shelda Pal, MD;  Location: [facility];  Service: Orthopedics;  Laterality: Right;  . Coronary angioplasty 1998    stents 1998     OT Assessment/Plan/Recommendation OT Assessment Clinical Impression  Statement: Pt doing well POD#1 R knee revision. All education completed, pt has all DME and will have necessary level of A upon d/c home. OT Recommendation/Assessment: Patient does not need any further OT services OT Recommendation Follow Up Recommendations: No OT follow up Equipment Recommended: None recommended by OT OT Goals    OT Evaluation Precautions/Restrictions  Precautions Precautions: Knee Restrictions Weight Bearing Restrictions: Yes RLE Weight Bearing: Partial weight bearing RLE Partial Weight Bearing Percentage or Pounds: 50 Prior Functioning Home Living Lives With: Spouse Receives Help From: Family Type of Home: House Home Layout: Two level;Able to live on main level with bedroom/bathroom Home Access: Stairs to  enter Entrance Stairs-Rails: Right;Left;Can reach both Entrance Stairs-Number of Steps: 4 at garage, 2 at back door. Bathroom Shower/Tub: Engineer, manufacturing systems: Standard Home Adaptive Equipment: Tub transfer bench;Walker - rolling;Bedside commode/3-in-1 Prior Function Level of Independence: Independent with basic ADLs;Requires assistive device for independence;Needs assistance with homemaking Meal Prep: Total Light Housekeeping: Total Driving: Yes Vocation: Retired ADL ADL Grooming: Performed;Wash/dry hands;Other (comment) (Minguard A) Where Assessed - Grooming: Standing at sink Upper Body Bathing: Simulated;Set up Where Assessed - Upper Body Bathing: Sitting, chair;Unsupported Lower Body Bathing: Simulated;Minimal assistance Where Assessed - Lower Body Bathing: Sit to stand from chair Upper Body Dressing: Performed;Set up Where Assessed - Upper Body Dressing: Standing Lower Body Dressing: Simulated;Moderate assistance Where Assessed - Lower Body Dressing: Sit to stand from chair Toilet Transfer: Performed;Other (comment) (Minguard A w/VCs for safe manipulation of RW) Toilet Transfer Method: Proofreader: Raised toilet seat with arms (or 3-in-1 over toilet) Toileting - Clothing Manipulation: Performed;Supervision/safety Where Assessed - Toileting Clothing Manipulation: Sit to stand from 3-in-1 or toilet Toileting - Hygiene: Performed;Supervision/safety Where Assessed - Toileting Hygiene: Sit to stand from 3-in-1 or toilet Tub/Shower Transfer: Not assessed Tub/Shower Transfer Method: Not assessed (Pt has tub transfer bench at home & knows how to utilize) Equipment Used: Agricultural consultant;Other (comment) (3:1) ADL Comments: Pt has good standing tolerance and is mobilizing well. Vision/Perception    Cognition Cognition Arousal/Alertness: Awake/alert Overall Cognitive Status: Appears within functional limits for tasks assessed Orientation Level:  Oriented X4 Sensation/Coordination   Extremity Assessment RUE Assessment RUE Assessment: Within Functional Limits LUE Assessment LUE Assessment: Within Functional Limits Mobility  Transfers Transfers: Yes Sit to Stand: From chair/3-in-1;MInguard A;With upper extremity assist;With armrests (VCs for hand placement and LE position) Stand to Sit: Other (comment)Minguard A;To chair/3-in-1;With upper extremity assist;With armrests (VCs for hand placement and LE position) Exercises   End of Session OT - End of Session Activity Tolerance: Patient tolerated treatment well Patient left: Other (comment) (ambulating with PT) General Behavior During Session: [facility] for tasks performed Cognition: [facility] for tasks performed   Gretchen Weinfeld A OTR/L [phone] 05/14/2011, 2:39 PM

## 2011-05-15 DIAGNOSIS — D62 Acute posthemorrhagic anemia: Secondary | ICD-10-CM

## 2011-05-15 LAB — BASIC METABOLIC PANEL
CO2: 27 mEq/L (ref 19–32)
Chloride: 101 mEq/L (ref 96–112)
Sodium: 134 mEq/L — ABNORMAL LOW (ref 135–145)

## 2011-05-15 LAB — CBC
MCV: 82.8 fL (ref 78.0–100.0)
Platelets: 147 10*3/uL — ABNORMAL LOW (ref 150–400)
RBC: 2.67 MIL/uL — ABNORMAL LOW (ref 4.22–5.81)
WBC: 5.3 10*3/uL (ref 4.0–10.5)

## 2011-05-15 LAB — PREPARE RBC (CROSSMATCH)

## 2011-05-15 MED ORDER — FUROSEMIDE 10 MG/ML IJ SOLN
20.0000 mg | Freq: Once | INTRAMUSCULAR | Status: AC
  Administered 2011-05-15: 20 mg via INTRAVENOUS
  Filled 2011-05-15 (×2): qty 2

## 2011-05-15 MED ORDER — DIPHENHYDRAMINE HCL 12.5 MG/5ML PO ELIX
12.5000 mg | ORAL_SOLUTION | Freq: Once | ORAL | Status: AC
  Administered 2011-05-15: 12.5 mg via ORAL
  Filled 2011-05-15: qty 5

## 2011-05-15 NOTE — Clinical Documentation Improvement (Signed)
     Anemia Blood Loss Clarification  THIS DOCUMENT IS NOT A PERMANENT PART OF THE MEDICAL RECORD  RESPOND TO THE THIS QUERY, FOLLOW THE INSTRUCTIONS BELOW:  1. If needed, update documentation for the patient's encounter via the notes activity.  2. Access this query again and click edit on the In [employer].  3. After updating, or not, click F2 to complete all highlighted (required) fields concerning your review. Select "additional documentation in the medical record" OR "no additional documentation provided".  4. Click Sign note button.  5. The deficiency will fall out of your In Basket *Please let us know if you are not able to complete this workflow by phone or e-mail (listed below).        05/15/11  Dear Humberto Leep PA Marton Redwood  In an effort to better capture your patient's severity of illness, reflect appropriate length of stay and utilization of resources, a review of the patient medical record has revealed the following indicators.    Based on your clinical judgment, please clarify and document in a progress note and/or discharge summary the clinical condition associated with the following supporting information:  In responding to this query please exercise your independent judgment.  The fact that a query is asked, does not imply that any particular answer is desired or expected.  Pt admitted with infected R knee and R TKR  According to lab pt's H/H= 6.8/22.1 post op  R TKR.   Please clarify based on abnormal H/H whether or not  H/H= 6.8/22. can be further specified as one of the diagnoses listed below and document in pn or d/c summary.    Possible Clinical Conditions?   " Expected Acute Blood Loss Anemia  " Acute Blood Loss Anemia  " Acute on chronic blood loss anemia  " Other Condition________________  " Cannot Clinically Determine  Risk Factors: (recent surgery, pre op anemia, EBL in OR)  Supporting Information: Infected knee. R TKR Signs and Symptoms    Abn HH   Diagnostics: Blood:200] per Op note: 05/13/11  Component     Latest Ref Rng 05/14/2011 05/15/2011  HGB     13.0 - 17.0 g/dL 7.6 (L) 6.8 (LL)  HCT     39.0 - 52.0 % 24.5 (L) 22.1 (L)   Treatments: Transfusion: IV fluids / plasma expanders: Serial H&H monitoring Medications ferrous sulfate tablet 325 mg  sodium chloride 0.9 % 1,000 mL with potassium chloride 10 mEq infusion Transfuse RBC per order 05/15/11    Reviewed: additional documentation in the medical record  Thank You,  Enis Slipper  RN, BSN, CCDS Clinical Documentation Specialist [facility] Dept Pager: [phone] / E-mail: [email or web address]  Health Information Management [place]

## 2011-05-15 NOTE — Progress Notes (Signed)
Physical Therapy Treatment Patient Details Name: [name] MRN: [id number] DOB: 09-May-1940 Today's Date: 05/15/2011  PT Assessment/Plan  PT - Assessment/Plan PT Plan: Discharge plan remains appropriate PT Frequency: 7X/week Recommendations for Other Services: OT consult Follow Up Recommendations: Home health PT;Skilled nursing facility Equipment Recommended: None recommended by OT PT Goals  Acute Rehab PT Goals PT Goal Formulation: With patient Time For Goal Achievement: 7 days Pt will go Supine/Side to Sit: with supervision PT Goal: Supine/Side to Sit - Progress: Progressing toward goal Pt will go Sit to Stand: with supervision PT Goal: Sit to Stand - Progress: Progressing toward goal Pt will go Stand to Sit: with supervision PT Goal: Stand to Sit - Progress: Progressing toward goal Pt will Ambulate: 51 - 150 feet;with supervision;with rolling walker PT Goal: Ambulate - Progress: Progressing toward goal  PT Treatment Precautions/Restrictions  Precautions Precautions: Knee Required Braces or Orthoses: Yes Knee Immobilizer: Discontinue once straight leg raise with < 10 degree lag Restrictions Weight Bearing Restrictions: Yes RLE Weight Bearing: Partial weight bearing RLE Partial Weight Bearing Percentage or Pounds: 50 Other Position/Activity Restrictions: Do not flex R knee past 40 degrees Mobility (including Balance) Bed Mobility Supine to Sit: 4: Min assist Supine to Sit Details (indicate cue type and reason): cues for sequence with min assist for R LE management Transfers Sit to Stand: 4: Min assist Sit to Stand Details (indicate cue type and reason): cues for use of UEs Stand to Sit: 4: Min assist Stand to Sit Details: cues for LE managment Ambulation/Gait Ambulation/Gait Assistance: 4: Min assist Ambulation/Gait Assistance Details (indicate cue type and reason): cues for posture and position from RW for [facility] Ambulation Distance (Feet): 28 Feet Assistive  device: Rolling walker Gait Pattern: Step-to pattern    Exercise  Total Joint Exercises Ankle Circles/Pumps: AROM;20 reps;Supine;Both Quad Sets: AROM;20 reps;Supine;Both Heel Slides: AAROM;20 reps;Supine;Right Straight Leg Raises: 20 reps;Right;AAROM;Supine End of Session PT - End of Session Equipment Utilized During Treatment: Right knee immobilizer Activity Tolerance: Patient tolerated treatment well Patient left: in chair;with call bell in reach;with family/visitor present Nurse Communication: Mobility status for transfers;Mobility status for ambulation General Behavior During Session: [facility] for tasks performed Cognition: [facility] for tasks performed  Jasper Hanf 05/15/2011, 12:19 PM

## 2011-05-15 NOTE — Progress Notes (Signed)
CRITICAL VALUE ALERT  Critical value received:  HGB 6.8  Date of notification:  05/15/11  Time of notification:  0600  Critical value read back: yes  Nurse who received alert:  Normajean Baxter  MD notified (1st page): on call PA Kriste Basque  Time of first page:  0600  MD notified (2nd page):  Time of second page:  Responding MD:  Kriste Basque, PA  Time MD responded:  [phone]  Orders received to transfuse 2 units of blood, premedicate with Bendryl 12.5mg  PO and to give 20mg  Lasix IV between units.

## 2011-05-15 NOTE — Progress Notes (Signed)
Physical Therapy Treatment Patient Details Name: [name] MRN: [id number] DOB: [birth year] Today's Date: 05/15/2011  PT Assessment/Plan  PT - Assessment/Plan PT Plan: Discharge plan remains appropriate PT Frequency: 7X/week Recommendations for Other Services: OT consult Follow Up Recommendations: Home health PT Equipment Recommended: None recommended by PT PT Goals  Acute Rehab PT Goals Time For Goal Achievement: 7 days Pt will go Sit to Stand: with supervision PT Goal: Sit to Stand - Progress: Progressing toward goal Pt will go Stand to Sit: with supervision PT Goal: Stand to Sit - Progress: Progressing toward goal Pt will Ambulate: 51 - 150 feet;with supervision;with rolling walker PT Goal: Ambulate - Progress: Progressing toward goal  PT Treatment Precautions/Restrictions  Precautions Precautions: Knee Required Braces or Orthoses: Yes Knee Immobilizer: Discontinue once straight leg raise with < 10 degree lag Restrictions Weight Bearing Restrictions: Yes RLE Weight Bearing: Partial weight bearing RLE Partial Weight Bearing Percentage or Pounds: 50 Other Position/Activity Restrictions: Do not flex R knee past 40 degrees Mobility (including Balance) Transfers Sit to Stand: 4: Min assist Sit to Stand Details (indicate cue type and reason): cues for use of UEs and for LE management Stand to Sit: 4: Min assist;5: Supervision Stand to Sit Details: cues for use of UEs and for LE management Ambulation/Gait Ambulation/Gait Assistance: 4: Min assist;5: Supervision Ambulation/Gait Assistance Details (indicate cue type and reason): cues for posture and position from RW for PWB Ambulation Distance (Feet): 130 Feet (130 + 35) Assistive device: Rolling walker Gait Pattern: Step-to pattern    Exercise    End of Session PT - End of Session Equipment Utilized During Treatment: Right knee immobilizer Activity Tolerance: Patient tolerated treatment well Patient left: in  chair;with call bell in reach;with family/visitor present Nurse Communication: Mobility status for transfers;Mobility status for ambulation General Behavior During Session: [facility] for tasks performed Cognition: [facility] for tasks performed  [name] 05/15/2011, 4:17 PM

## 2011-05-15 NOTE — Progress Notes (Addendum)
Subjective: 2 Days Post-Op Procedure(s) (LRB): TOTAL KNEE REVISION (Right)   Patient reports pain as mild. Feels a little weak, but to be expected with h&h levels, going to be given 2 unit of blood this morning. He is excited to finally have his knee replacement other than antibiotic spacers. No other events.  Objective:   VITALS:   Filed Vitals:   05/15/11 0532  BP: 126/67  Pulse: 62  Temp: 97.5 F (36.4 C)  Resp: 16    Neurovascular intact Dorsiflexion/Plantar flexion intact  LABS  Basename 05/15/11 0450 05/14/11 0418  HGB 6.8* 7.6*  HCT 22.1* 24.5*  WBC 5.3 6.9  PLT 147* 156     Basename 05/15/11 0450 05/14/11 0418  NA 134* 136  K 4.4 4.5  BUN 17 17  CREATININE 1.28 1.20  GLUCOSE 91 107*     Assessment/Plan: 2 Days Post-Op Procedure(s) (LRB): TOTAL KNEE REVISION (Right)   Acute blood loss anemia - receiving 2 units of blood this morning HV drain d/c'ed Up with therapy Plan for discharge tomorrow if he continues to do well   [name]. [name]   PAC  05/15/2011, 9:19 AM

## 2011-05-16 LAB — TYPE AND SCREEN
Antibody Screen: POSITIVE
Donor AG Type: NEGATIVE
Donor AG Type: NEGATIVE

## 2011-05-16 LAB — BASIC METABOLIC PANEL
BUN: 14 mg/dL (ref 6–23)
Chloride: 103 mEq/L (ref 96–112)
GFR calc Af Amer: 67 mL/min — ABNORMAL LOW (ref >90)
Glucose, Bld: 92 mg/dL (ref 70–99)
Potassium: 4.6 mEq/L (ref 3.5–5.1)

## 2011-05-16 LAB — CBC
HCT: 31.3 % — ABNORMAL LOW (ref 39.0–52.0)
Hemoglobin: 9.8 g/dL — ABNORMAL LOW (ref 13.0–17.0)
WBC: 7.9 10*3/uL (ref 4.0–10.5)

## 2011-05-16 MED ORDER — OXYCODONE HCL 10 MG PO TABS: 10.0000 mg | ORAL_TABLET | ORAL | Status: AC | PRN

## 2011-05-16 MED ORDER — METHOCARBAMOL 500 MG PO TABS: 500.0000 mg | ORAL_TABLET | Freq: Four times a day (QID) | ORAL | Status: AC | PRN

## 2011-05-16 MED ORDER — POLYETHYLENE GLYCOL 3350 17 G PO PACK: 17.0000 g | PACK | Freq: Two times a day (BID) | ORAL | Status: AC

## 2011-05-16 MED ORDER — FERROUS SULFATE 325 (65 FE) MG PO TABS: 325.0000 mg | ORAL_TABLET | Freq: Three times a day (TID) | ORAL | Status: DC

## 2011-05-16 MED ORDER — DIPHENHYDRAMINE HCL 25 MG PO CAPS: 25.0000 mg | ORAL_CAPSULE | Freq: Four times a day (QID) | ORAL | Status: DC | PRN

## 2011-05-16 MED ORDER — DSS 100 MG PO CAPS: 100.0000 mg | ORAL_CAPSULE | Freq: Two times a day (BID) | ORAL | Status: AC

## 2011-05-16 MED ORDER — ASPIRIN EC 325 MG PO TBEC: 325.0000 mg | DELAYED_RELEASE_TABLET | Freq: Two times a day (BID) | ORAL | Status: AC

## 2011-05-16 NOTE — Progress Notes (Signed)
Subjective: 3 Days Post-Op Procedure(s) (LRB): TOTAL KNEE REVISION (Right)   Patient reports pain as mild. He is very excited to have a knee replacement finally. No events throughout the night. Ready to be discharged home.  Objective:   VITALS:   Filed Vitals:   05/16/11 0500  BP: 149/73  Pulse: 101  Temp: 98.6 F (37 C)  Resp: 16    Neurovascular intact Dorsiflexion/Plantar flexion intact Incision: scant drainage  LABS  Basename 05/16/11 0444 05/15/11 0450 05/14/11 0418  HGB 9.8* 6.8* 7.6*  HCT 31.3* 22.1* 24.5*  WBC 7.9 5.3 6.9  PLT 208 147* 156     Basename 05/16/11 0444 05/15/11 0450 05/14/11 0418  NA 137 134* 136  K 4.6 4.4 4.5  BUN 14 17 17   CREATININE 1.21 1.28 1.20  GLUCOSE 92 91 107*     Assessment/Plan: 3 Days Post-Op Procedure(s) (LRB): TOTAL KNEE REVISION (Right)   Dressing changed, main incision looked good without drainage. HV drain site has some bleeding and needed to be reinforced, pt instructed on how to do so. Up with therapy Discharge home with home health Follow up in 2 weeks at [facility].  Follow-up Information    Follow up with OLIN,Jaxston Chohan D in 2 weeks.   Contact information:   [facility] [address] [place] [address] [phone]          Anastasio Auerbach. Gian Ybarra   PAC  05/16/2011, 9:12 AM

## 2011-05-16 NOTE — Progress Notes (Signed)
CARE MANAGEMENT NOTE 05/16/2011  Patient:  [name], [name]   Account Number:  [id number]  Date Initiated:  05/14/2011  Documentation initiated by:  Colleen Can  Subjective/Objective Assessment:   DX Reimplantation of right total knee revision     Action/Plan:   Pt states he is currently unsure of discharge plans. Previously went home with [facility] services. Already has DME at home. Lives in [place]  CM will follow   Anticipated DC Date:  05/16/2011   Anticipated DC Plan:  HOME W HOME HEALTH SERVICES  In-house referral  Clinical Social Worker      DC Associate Professor  CM consult      [facility] Choice  HOME HEALTH  DURABLE MEDICAL EQUIPMENT   Choice offered to / List presented to:  C-1 Patient   DME arranged  CPM      DME agency  [employer]     HH arranged  HH-2 PT      [facility] agency  [facility]   Status of service:  Completed, signed off Medicare Important Message given?  NA - LOS <3 / Initial given by admissions (If response is "NO", the following Medicare IM given date fields will be blank) Date Medicare IM given:   Date Additional Medicare IM given:    Discharge Disposition:  HOME W HOME HEALTH SERVICES  Per UR Regulation:    If discussed at Long Length of Stay Meetings, dates discussed:    Comments:  05/16/2011 Raynelle Bring BSN CCM [phone] Pt plans to go home to [place] where spouse will be caregiver, daughter will assist with care. Pt is requesting [facility] services. Services will start friday 05/17/2011. Faxed face sheet, op note, HH orders, H&P to [facility] as requested by Kennon Rounds. There are orders for home cpm machine-called [employer] [phone] /spoke with Missy Sabins who advised that CPM would be delivered to patient today at his home. Face sheet, CPM orders faxed to [phone] Pt discharged today. List of agencies placed on shadow chart.

## 2011-05-16 NOTE — Discharge Summary (Signed)
Physician Discharge Summary  Patient ID: [name] MRN: [id number] DOB/AGE: May 15, 1940 71 y.o.  Admit date: 05/13/2011 Discharge date: 05/16/2011  Procedures:  Procedure(s) (LRB): TOTAL KNEE REVISION (Right)  Attending Physician:  Dr. Durene Romans   Admission Diagnoses: Right knee infection / S/P antibiotic spacer placement x2  Discharge Diagnoses:  Principal Problem:  *S/P right TK revision Active Problems:  Acute blood loss anemia   HPI: Pt is a 71 y.o. male with a history of a right total knee arthroplasty and subsequent infection. On November 20, 2010, he underwent a removal of the implant with an I&D and implantation of a antibiotic spacer. He was also on weeks of antibiotics as well to help treat the infection. Upon entering the knee on 03/25/2011 cultures were taken which revealed continued infection. The old antibiotic spacer was removed and replaced with a new one. He again was placed on IV antibiotics. Most recent visit to the clinic the patient was feeling better than he had and looking forward to proceeding with surgery. Pending the results of a CRP and Sed rate surgery is planned. Risks, benefits and expectations were discussed with the patient. Patient understand the risks, benefits and expectations and wishes to proceed with surgery.   PCP: Carrie Mew, MD, MD   Discharged Condition: good  Hospital Course:  Patient underwent the above stated procedure on 05/13/2011. Patient tolerated the procedure well and brought to the recovery room in good condition and subsequently to the floor.  POD #1 BP: 127/74 ; Pulse: 92 ; Temp: 98.3 F (36.8 C) ; Resp: 16  Pt's foley was removed, as well as the hemovac drain removed. IV was changed to a saline lock. Patient reports pain as moderate, but seems comfortable this am. Happy that new knee in place. Neurovascular intact and incision: dressing C/D/I.  LABS  Basename  05/14/11 0418   HGB  7.6  HCT  24.5    POD #2  BP:  126/67 ; Pulse: 62 ; Temp: 97.5 F (36.4 C) ; Resp: 16  Patient reports pain as mild. Feels a little weak, but to be expected with h&h levels, received 2 units of blood this morning. He is excited to finally have his knee replacement other than antibiotic spacers. No other events.  Neurovascular intact and dorsiflexion/plantar flexion intact.  LABS  Basename  05/15/11 0450  HGB  6.8  HCT  22.1   POD #3  BP: 149/73 ; Pulse: 101 ; Temp: 98.6 F (37 C) ; Resp: 16 Patient reports pain as mild. Feels better after receiving blood yesterday. No events throughout the night. Ready to be discharged home. Neurovascular intact, dorsiflexion/plantar flexion intact and incision: scant drainage.  LABS  Basename  05/16/11 0444  HGB  9.8  HCT  31.3    Discharge Exam: General appearance: alert, cooperative and no distress Extremities: Homans sign is negative, no sign of DVT, no edema, redness or tenderness in the calves or thighs and no ulcers, gangrene or trophic changes  Disposition: Home with follow up in 2 weeks  Follow-up Information    Follow up with OLIN,Bailey Faiella D in 2 weeks.   Contact information:   [facility] [address], [address] [place] [address] [phone]          Discharge Orders    Future Appointments: Provider: Department: Dept Phone: Center:   06/11/2011 10:30 AM Stacie Glaze, MD [facility] [phone] [facility]     Future Orders Please Complete By Expires  Diet - low sodium heart healthy      Call MD / Call 911      Comments:   If you experience chest pain or shortness of breath, CALL 911 and be transported to the hospital emergency room.  If you develope a fever above 101 F, pus (white drainage) or increased drainage or redness at the wound, or calf pain, call your surgeon's office.   Discharge instructions      Comments:   Daily dressing changes with gauze and tape, reinforce the dressing as needed for bleeding. Keep  the area dry and clean until follow up. Follow up in 2 weeks at [facility]. Call with any questions or concerns.  PT - Primary goal is for wound healing. Gentle passive ROM 0-40 degrees, no deep flexion. 50% WB right leg. Also working on quad strengthening and ambulation.  CPM for home use. 0-40 degrees, do NOT advance. 4-6 hours / day.    Constipation Prevention      Comments:   Drink plenty of fluids.  Prune juice may be helpful.  You may use a stool softener, such as Colace (over the counter) 100 mg twice a day.  Use MiraLax (over the counter) for constipation as needed.   Increase activity slowly as tolerated      Weight Bearing as taught in Physical Therapy      Comments:   50% weight bearing on the right leg.  Use a walker or crutches as instructed.   Driving restrictions      Comments:   No driving for 4 weeks   TED hose      Comments:   Use stockings (TED hose) for 2 weeks on both leg(s).  You may remove them at night for sleeping.   Change dressing      Comments:   Change the dressing daily with sterile 4 x 4 inch gauze dressing and tape. Keep the area dry and clean.      Current Discharge Medication List    START taking these medications   Details  aspirin EC 325 MG tablet Take 1 tablet (325 mg total) by mouth 2 (two) times daily. X 4 weeks Qty: 60 tablet, Refills: 0    diphenhydrAMINE (BENADRYL) 25 mg capsule Take 1 capsule (25 mg total) by mouth every 6 (six) hours as needed for itching, allergies or sleep. Qty: 30 capsule    docusate sodium 100 MG CAPS Take 100 mg by mouth 2 (two) times daily.    ferrous sulfate 325 (65 FE) MG tablet Take 1 tablet (325 mg total) by mouth 3 (three) times daily after meals.    polyethylene glycol (MIRALAX / GLYCOLAX) packet Take 17 g by mouth 2 (two) times daily.      CONTINUE these medications which have CHANGED   Details  methocarbamol (ROBAXIN) 500 MG tablet Take 1 tablet (500 mg total) by mouth every 6 (six)  hours as needed (muscle spasms). Qty: 50 tablet, Refills: 0    oxyCODONE 10 MG TABS Take 1-2 tablets (10-20 mg total) by mouth every 4 (four) hours as needed for pain. Qty: 120 tablet, Refills: 0      CONTINUE these medications which have NOT CHANGED   Details  bisoprolol (ZEBETA) 5 MG tablet Take 2.5 mg by mouth daily after breakfast.     morphine (MS CONTIN) 30 MG 12 hr tablet Take 1 tablet (30 mg total) by mouth 2 (two) times daily. Qty: 60 tablet, Refills: 0  simvastatin (ZOCOR) 40 MG tablet Take 1 tablet (40 mg total) by mouth at bedtime. Qty: 30 tablet, Refills: 6      STOP taking these medications     ALPRAZolam (XANAX) 0.5 MG tablet Comments:  Reason for Stopping:       Phenyleph-CPM-DM-APAP (ALKA-SELTZER PLUS COLD & COUGH) 06-19-08-325 MG CAPS Comments:  Reason for Stopping:            Signed: Anastasio Auerbach. Kemonie Cutillo   PAC  05/16/2011, 9:32 AM

## 2011-05-16 NOTE — Progress Notes (Signed)
Physical Therapy Treatment Patient Details Name: [name] MRN: [id number] DOB: 1940/03/16 Today's Date: 05/16/2011  PT Assessment/Plan  PT - Assessment/Plan Comments on Treatment Session: Pt is thrilled with new knee - states it feels so much better already PT Plan: Discharge plan remains appropriate PT Frequency: 7X/week Recommendations for Other Services: OT consult Follow Up Recommendations: Home health PT Equipment Recommended: None recommended by PT PT Goals  Acute Rehab PT Goals PT Goal Formulation: With patient Time For Goal Achievement: 7 days Pt will go Supine/Side to Sit: with supervision PT Goal: Supine/Side to Sit - Progress: Met Pt will go Sit to Supine/Side: with supervision PT Goal: Sit to Supine/Side - Progress: Met Pt will go Sit to Stand: with supervision PT Goal: Sit to Stand - Progress: Met Pt will go Stand to Sit: with supervision PT Goal: Stand to Sit - Progress: Met Pt will Ambulate: 51 - 150 feet;with supervision;with rolling walker PT Goal: Ambulate - Progress: Met Pt will Go Up / Down Stairs: 3-5 stairs;with min assist;with least restrictive assistive device PT Goal: Up/Down Stairs - Progress: Met  PT Treatment Precautions/Restrictions  Precautions Precautions: Knee Required Braces or Orthoses: Yes Knee Immobilizer: Discontinue once straight leg raise with < 10 degree lag (Pt performed IND SLR this am) Restrictions Weight Bearing Restrictions: Yes RLE Weight Bearing: Partial weight bearing RLE Partial Weight Bearing Percentage or Pounds: 50 Other Position/Activity Restrictions: Do not flex R knee past 40 degrees Mobility (including Balance) Bed Mobility Supine to Sit: 5: Supervision Sit to Supine: 5: Supervision Transfers Sit to Stand: 5: Supervision Sit to Stand Details (indicate cue type and reason): min cues for use of UEs Stand to Sit: 5: Supervision Stand to Sit Details: min cues for LE management Ambulation/Gait Ambulation/Gait  Assistance: 5: Supervision Ambulation/Gait Assistance Details (indicate cue type and reason): CGA with cues for position from RW and posture to maintain PWB Ambulation Distance (Feet): 125 Feet Assistive device: Rolling walker Gait Pattern: Step-to pattern Stairs: Yes Stairs Assistance: 4: Min assist Stairs Assistance Details (indicate cue type and reason): cues for sequence, foot/RW placement and increased UE WB.  Up/down with Bil rails and up/down with RW bckwd  Stair Management Technique: No rails;Two rails;With walker;Backwards;Forwards Number of Stairs: 3  (3x2)    Exercise  Total Joint Exercises Ankle Circles/Pumps: AROM;20 reps;Supine;Both Quad Sets: AROM;20 reps;Supine;Both Heel Slides: AAROM;20 reps;Supine;Right Straight Leg Raises: 20 reps;Right;AAROM;Supine End of Session PT - End of Session Activity Tolerance: Patient tolerated treatment well Patient left: in chair;with call bell in reach;with family/visitor present Nurse Communication: Mobility status for transfers;Mobility status for ambulation General Behavior During Session: [facility] for tasks performed Cognition: [facility] for tasks performed  [name] 05/16/2011, 2:51 PM

## 2011-05-17 ENCOUNTER — Emergency Department (HOSPITAL_COMMUNITY)
Admission: EM | Admit: 2011-05-17 | Discharge: 2011-05-18 | Disposition: A | Discharge status: Home / Self Care | Payer: Medicare Other | Attending: Emergency Medicine | Admitting: Emergency Medicine

## 2011-05-17 ENCOUNTER — Other Ambulatory Visit: Payer: Self-pay

## 2011-05-17 ENCOUNTER — Emergency Department (HOSPITAL_COMMUNITY): Payer: Medicare Other

## 2011-05-17 ENCOUNTER — Encounter (HOSPITAL_COMMUNITY): Payer: Self-pay | Admitting: Orthopedic Surgery

## 2011-05-17 DIAGNOSIS — Z96659 Presence of unspecified artificial knee joint: Secondary | ICD-10-CM | POA: Insufficient documentation

## 2011-05-17 DIAGNOSIS — I10 Essential (primary) hypertension: Secondary | ICD-10-CM | POA: Insufficient documentation

## 2011-05-17 DIAGNOSIS — M7989 Other specified soft tissue disorders: Secondary | ICD-10-CM | POA: Insufficient documentation

## 2011-05-17 DIAGNOSIS — R231 Pallor: Secondary | ICD-10-CM | POA: Insufficient documentation

## 2011-05-17 DIAGNOSIS — Z79899 Other long term (current) drug therapy: Secondary | ICD-10-CM | POA: Insufficient documentation

## 2011-05-17 DIAGNOSIS — R110 Nausea: Secondary | ICD-10-CM

## 2011-05-17 DIAGNOSIS — R079 Chest pain, unspecified: Secondary | ICD-10-CM | POA: Insufficient documentation

## 2011-05-17 LAB — COMPREHENSIVE METABOLIC PANEL
ALT: 9 U/L (ref 0–53)
AST: 22 U/L (ref 0–37)
Calcium: 10.1 mg/dL (ref 8.4–10.5)
Sodium: 133 mEq/L — ABNORMAL LOW (ref 135–145)
Total Protein: 7.9 g/dL (ref 6.0–8.3)

## 2011-05-17 LAB — DIFFERENTIAL
Basophils Absolute: 0 10*3/uL (ref 0.0–0.1)
Eosinophils Absolute: 0.3 10*3/uL (ref 0.0–0.7)
Eosinophils Relative: 3 % (ref 0–5)

## 2011-05-17 LAB — CBC
MCH: 26.4 pg (ref 26.0–34.0)
MCV: 83.5 fL (ref 78.0–100.0)
Platelets: 269 10*3/uL (ref 150–400)
RDW: 15.9 % — ABNORMAL HIGH (ref 11.5–15.5)
WBC: 8.7 10*3/uL (ref 4.0–10.5)

## 2011-05-17 MED ORDER — ONDANSETRON HCL 4 MG/2ML IJ SOLN
4.0000 mg | Freq: Once | INTRAMUSCULAR | Status: AC
  Administered 2011-05-17: 4 mg via INTRAVENOUS
  Filled 2011-05-17: qty 2

## 2011-05-17 MED ORDER — MORPHINE SULFATE 4 MG/ML IJ SOLN
4.0000 mg | Freq: Once | INTRAMUSCULAR | Status: AC
  Administered 2011-05-17: 4 mg via INTRAVENOUS
  Filled 2011-05-17: qty 1

## 2011-05-17 MED ORDER — SODIUM CHLORIDE 0.9 % IV BOLUS (SEPSIS)
500.0000 mL | Freq: Once | INTRAVENOUS | Status: AC
  Administered 2011-05-18: 500 mL via INTRAVENOUS

## 2011-05-17 NOTE — ED Notes (Signed)
Pt began feeling lightheaded and believed he would pass out. C/o chest pain. EKG done

## 2011-05-17 NOTE — ED Provider Notes (Signed)
History     CSN: [id number]  Arrival date & time 05/17/11  1949   First MD Initiated Contact with Patient 05/17/11 2226      Chief Complaint  Patient presents with  . Nausea  . possible HTN      HPI  History provided by the patient and family. Patient is a 71 year old male with history of hypertension, hyperlipidemia, CAD, sinus arrhythmia and recent right knee replacement 4 days ago who presents with complaints of nausea today. Patient just returned home yesterday after knee surgery. Patient was feeling well until this morning when he became nauseous throughout the entire day. Patient also felt his blood pressure was elevated today patient denies any episodes of vomiting, abdominal pain or diarrhea or constipation. He had normal bowel movements today. Patient also began having some throbbing chest and epigastric discomfort while waiting in the emergency room. Symptoms are not similar to previous MIs. Patient denies any associated shortness of breath, cough, fever, hemoptysis, pleuritic pains     Past Medical History  Diagnosis Date  . Hypertension   . Hyperlipidemia   . Preoperative cardiovascular examination   . Spinal stenosis, lumbar   . CAS (cerebral atherosclerosis)   . Erectile dysfunction   . Preventative health care   . Benign prostatic hypertrophy with urinary obstruction   . Family history of early CAD     male 1st degree relative <50  . Peptic ulcer disease   . Low back pain   . Superficial spreading melanoma   . Sleep apnea, obstructive     CPAP-does not use  . HA (headache)     sinus headaches  . Arthritis     knees  . Myocardial infarction 1997    Hx MI 1997 and 1998  . Anxiety   . Pneumonia 2005  . Anemia   . Depression     takes Zoloft  . Neuropathy, autonomic, idiopathic peripheral, other   . Blood transfusion 1 yr. ago    jerking,fever-after blood transfusion  . Blood transfusion     given wrong blood  . Coronary artery disease 1998   cardiac stent/ Clearance Dr Lovell Sheehan and Jacinto Halim with note on chart  . Dysrhythmia     hx of atrial fib per ov note of 12/12   . Bradycardia     hx of bradycardia in past per ov note of 12/12    Past Surgical History  Procedure Date  . Esophagogastroduodenoscopy 2004  . Colonoscopy 2004  . Lumbar laminectomy   . Lumbar fusion   . Knee arthroscopy     left  . Shoulder arthroscopy     right  . Knee arthroscopy     right  . Decompression of the median nerve     left wrist and hand  . Revision of tkr 2011    on left  . Coronary stent placement   . Back surgery 2005-last    lumbar x2  . Excisional total knee arthroplasty 12/25/2010    Procedure: EXCISIONAL TOTAL KNEE ARTHROPLASTY;  Surgeon: Shelda Pal;  Location: [facility];  Service: Orthopedics;  Laterality: Right;  repeat irrigation   and debridement, removal and reinsertation of spacer block  . Joint replacement 2011    left knee x 2 ; 08/2010-right knee-infected  . I&d extremity 03/25/2011    Procedure: IRRIGATION AND DEBRIDEMENT EXTREMITY;  Surgeon: Shelda Pal, MD;  Location: [facility];  Service: Orthopedics;  Laterality: Right;  . Coronary angioplasty 1998  stents 1998   . Total knee revision 05/13/2011    Procedure: TOTAL KNEE REVISION;  Surgeon: Shelda Pal, MD;  Location: [facility];  Service: Orthopedics;  Laterality: Right;  Reimplantation    History reviewed. No pertinent family history.  History  Substance Use Topics  . Smoking status: Former Smoker -- 1.0 packs/day for 15 years    Types: Cigars    Quit date: 12/23/2009  . Smokeless tobacco: Never Used   Comment: smoked a pipe for 6-7 y ears  . Alcohol Use: No      Review of Systems  Constitutional: Negative for fever and chills.  Respiratory: Negative for cough and shortness of breath.   Cardiovascular: Positive for chest pain.  Gastrointestinal: Positive for nausea. Negative for vomiting, abdominal pain, diarrhea and constipation.  Genitourinary:  Negative for dysuria, frequency and flank pain.  Neurological: Negative for weakness, numbness and headaches.    Allergies  Acetaminophen  Home Medications   Current Outpatient Rx  Name Route Sig Dispense Refill  . ASPIRIN EC 325 MG PO TBEC Oral Take 1 tablet (325 mg total) by mouth 2 (two) times daily. X 4 weeks 60 tablet 0  . BISOPROLOL FUMARATE 5 MG PO TABS Oral Take 2.5 mg by mouth daily after breakfast.     . METHOCARBAMOL 500 MG PO TABS Oral Take 1 tablet (500 mg total) by mouth every 6 (six) hours as needed (muscle spasms). 50 tablet 0  . MORPHINE SULFATE ER 30 MG PO TB12 Oral Take 1 tablet (30 mg total) by mouth 2 (two) times daily. 60 tablet 0  . OXYCODONE HCL 10 MG PO TABS Oral Take 1-2 tablets (10-20 mg total) by mouth every 4 (four) hours as needed for pain. 120 tablet 0  . SIMVASTATIN 40 MG PO TABS Oral Take 40 mg by mouth at bedtime.    Marland Kitchen DIPHENHYDRAMINE HCL 25 MG PO CAPS Oral Take 1 capsule (25 mg total) by mouth every 6 (six) hours as needed for itching, allergies or sleep. 30 capsule   . DSS 100 MG CAPS Oral Take 100 mg by mouth 2 (two) times daily.    Marland Kitchen FERROUS SULFATE 325 (65 FE) MG PO TABS Oral Take 1 tablet (325 mg total) by mouth 3 (three) times daily after meals.    Marland Kitchen POLYETHYLENE GLYCOL 3350 PO PACK Oral Take 17 g by mouth 2 (two) times daily.      BP 148/88  Pulse 77  Temp(Src) 98.3 F (36.8 C) (Oral)  Resp 13  Wt 189 lb (85.73 kg)  SpO2 100%  Physical Exam  Nursing note and vitals reviewed. Constitutional: He is oriented to person, place, and time. He appears well-developed and well-nourished. No distress.  HENT:  Head: Normocephalic and atraumatic.  Neck: Normal range of motion. Neck supple.       No meningeal signs.  Cardiovascular: Normal rate and regular rhythm.   Pulmonary/Chest: Effort normal and breath sounds normal. No respiratory distress. He has no wheezes. He has no rales.  Abdominal: Soft. He exhibits no distension. There is no tenderness.  There is no rebound.  Musculoskeletal:       Swelling of right knee and lower leg.  Bandages in place around rt knee without drainage.    Neurological: He is alert and oriented to person, place, and time.  Skin: There is pallor.  Psychiatric: He has a normal mood and affect. His behavior is normal.    ED Course  Procedures   Results for  orders placed during the hospital encounter of 05/17/11  CBC      Component Value Range   WBC 8.7  4.0 - 10.5 (K/uL)   RBC 4.13 (*) 4.22 - 5.81 (MIL/uL)   Hemoglobin 10.9 (*) 13.0 - 17.0 (g/dL)   HCT 16.1 (*) 09.6 - 52.0 (%)   MCV 83.5  78.0 - 100.0 (fL)   MCH 26.4  26.0 - 34.0 (pg)   MCHC 31.6  30.0 - 36.0 (g/dL)   RDW 04.5 (*) 40.9 - 15.5 (%)   Platelets 269  150 - 400 (K/uL)  DIFFERENTIAL      Component Value Range   Neutrophils Relative 66  43 - 77 (%)   Neutro Abs 5.7  1.7 - 7.7 (K/uL)   Lymphocytes Relative 24  12 - 46 (%)   Lymphs Abs 2.1  0.7 - 4.0 (K/uL)   Monocytes Relative 6  3 - 12 (%)   Monocytes Absolute 0.5  0.1 - 1.0 (K/uL)   Eosinophils Relative 3  0 - 5 (%)   Eosinophils Absolute 0.3  0.0 - 0.7 (K/uL)   Basophils Relative 0  0 - 1 (%)   Basophils Absolute 0.0  0.0 - 0.1 (K/uL)  COMPREHENSIVE METABOLIC PANEL      Component Value Range   Sodium 133 (*) 135 - 145 (mEq/L)   Potassium 3.5  3.5 - 5.1 (mEq/L)   Chloride 96  96 - 112 (mEq/L)   CO2 24  19 - 32 (mEq/L)   Glucose, Bld 117 (*) 70 - 99 (mg/dL)   BUN 9  6 - 23 (mg/dL)   Creatinine, Ser 8.11  0.50 - 1.35 (mg/dL)   Calcium 91.4  8.4 - 10.5 (mg/dL)   Total Protein 7.9  6.0 - 8.3 (g/dL)   Albumin 3.8  3.5 - 5.2 (g/dL)   AST 22  0 - 37 (U/L)   ALT 9  0 - 53 (U/L)   Alkaline Phosphatase 136 (*) 39 - 117 (U/L)   Total Bilirubin 0.6  0.3 - 1.2 (mg/dL)   GFR calc non Af Amer 80 (*) >90 (mL/min)   GFR calc Af Amer >90  >90 (mL/min)  POCT I-STAT TROPONIN I      Component Value Range   Troponin i, poc 0.02  0.00 - 0.08 (ng/mL)   Comment 3               Dg Chest 2  View  05/17/2011  *RADIOLOGY REPORT*  Clinical Data: To, chest pain.  CHEST - 2 VIEW  Comparison: 11/20/2010  Findings: Mild cardiomegaly and hyperinflation of the lungs.  No confluent airspace opacities or effusions.  Degenerative changes in the thoracic spine.  IMPRESSION: Mild cardiomegaly and hyperinflation.  No active disease.  Original Report Authenticated By: Cyndie Chime, M.D.     1. Nausea       MDM  10:45 PM patient seen and evaluated. Patient in no acute distress.   Review of recent surgery and admission notes show patient received Xarelto for prophylactic anticoagulation.  Pt seen and discussed with Attending Physician. Patient feeling better after treatments. Patient is afebrile with no concerning lab findings, chest x-ray or EKG. At this time feel patient may return home with continued followup with his doctors.     Date: 05/17/2011  Rate: 87  Rhythm: sinus arrhythmia  QRS Axis: normal  Intervals: QT prolonged  ST/T Wave abnormalities: normal  Conduction Disutrbances:none  Narrative Interpretation: Accelerated Junctional rhythm  Old EKG Reviewed:  No significant changes from May 06, 2011    Phill [facility] [place], [place] 05/18/11 2117

## 2011-05-17 NOTE — ED Notes (Signed)
Pt was in the hospital for knee surgery, left yesterday, pt was at the PT and when BP measured noted to be elevated, 178/80 in the office and at home 19//84, 197/104. Pt reports being " very sick to my stomach"

## 2011-05-18 MED ORDER — ONDANSETRON 8 MG PO TBDP: ORAL_TABLET | ORAL | Status: AC

## 2011-05-18 NOTE — Discharge Instructions (Signed)
You were seen and evaluated for your symptoms of nausea. Your lab tests today and x-rays have not shown any concerning signs or symptoms. You were treated with fluids and antinausea medications. You reported feeling much better after receiving treatment in the emergency room. At this time your providers feel you are safe to return home and followup with your primary care provider and specialists next week. Return to emergency room if you develop any worsening symptoms, chest pain, shortness of breath, persistent nausea and vomiting, fever, chills, sweats.   Nausea and Vomiting Nausea is a sick feeling that often comes before throwing up (vomiting). Vomiting is a reflex where stomach contents come out of your mouth. Vomiting can cause severe loss of body fluids (dehydration). Children and elderly adults can become dehydrated quickly, especially if they also have diarrhea. Nausea and vomiting are symptoms of a condition or disease. It is important to find the cause of your symptoms. CAUSES   Direct irritation of the stomach lining. This irritation can result from increased acid production (gastroesophageal reflux disease), infection, food poisoning, taking certain medicines (such as nonsteroidal anti-inflammatory drugs), alcohol use, or tobacco use.   Signals from the brain.These signals could be caused by a headache, heat exposure, an inner ear disturbance, increased pressure in the brain from injury, infection, a tumor, or a concussion, pain, emotional stimulus, or metabolic problems.   An obstruction in the gastrointestinal tract (bowel obstruction).   Illnesses such as diabetes, hepatitis, gallbladder problems, appendicitis, kidney problems, cancer, sepsis, atypical symptoms of a heart attack, or eating disorders.   Medical treatments such as chemotherapy and radiation.   Receiving medicine that makes you sleep (general anesthetic) during surgery.  DIAGNOSIS Your caregiver may ask for tests to  be done if the problems do not improve after a few days. Tests may also be done if symptoms are severe or if the reason for the nausea and vomiting is not clear. Tests may include:  Urine tests.   Blood tests.   Stool tests.   Cultures (to look for evidence of infection).   X-rays or other imaging studies.  Test results can help your caregiver make decisions about treatment or the need for additional tests. TREATMENT You need to stay well hydrated. Drink frequently but in small amounts.You may wish to drink water, sports drinks, clear broth, or eat frozen ice pops or gelatin dessert to help stay hydrated.When you eat, eating slowly may help prevent nausea.There are also some antinausea medicines that may help prevent nausea. HOME CARE INSTRUCTIONS   Take all medicine as directed by your caregiver.   If you do not have an appetite, do not force yourself to eat. However, you must continue to drink fluids.   If you have an appetite, eat a normal diet unless your caregiver tells you differently.   Eat a variety of complex carbohydrates (rice, wheat, potatoes, bread), lean meats, yogurt, fruits, and vegetables.   Avoid high-fat foods because they are more difficult to digest.   Drink enough water and fluids to keep your urine clear or pale yellow.   If you are dehydrated, ask your caregiver for specific rehydration instructions. Signs of dehydration may include:   Severe thirst.   Dry lips and mouth.   Dizziness.   Dark urine.   Decreasing urine frequency and amount.   Confusion.   Rapid breathing or pulse.  SEEK IMMEDIATE MEDICAL CARE IF:   You have blood or brown flecks (like coffee grounds) in your vomit.  You have black or bloody stools.   You have a severe headache or stiff neck.   You are confused.   You have severe abdominal pain.   You have chest pain or trouble breathing.   You do not urinate at least once every 8 hours.   You develop cold or clammy  skin.   You continue to vomit for longer than 24 to 48 hours.   You have a fever.  MAKE SURE YOU:   Understand these instructions.   Will watch your condition.   Will get help right away if you are not doing well or get worse.  Document Released: 02/04/2005 Document Revised: 01/24/2011 Document Reviewed: 07/04/2010 [facility] Patient Information 2012 [place], [place].    RESOURCE GUIDE  Dental Problems  Patients with Medicaid: [facility] [id number]                                           [phone] [address] Phone:  [phone]                                                  Phone:  [phone]  If unable to pay or uninsured, contact:  [facility] or [facility]. to become qualified for the adult dental clinic.  Chronic Pain Problems Contact [facility]  [phone] Patients need to be referred by their primary care doctor.  Insufficient Money for Medicine Contact [employer] Way:  call "211" or [facility] 579-875-0521.  No Primary Care Doctor Call [facility]  [phone] Other agencies that provide inexpensive medical care    [facility]  [phone]    [facility]  [phone]    [facility]  [phone]    [facility]  [phone]    [facility]  [phone]    [facility]  [phone]  Psychological Services [facility]  [phone] [facility]  [phone] [facility]   [phone] (emergency services [phone])  Substance Abuse Resources [facility]  ([phone] [facility] 641-194-9227 [facility] [phone] Floydene Flock [phone] Residential & Outpatient Substance Abuse Program  [phone]  Abuse/Neglect [facility] Child Abuse Hotline (380) 221-1051 [facility] Child Abuse Hotline (438) 690-6532 (After Hours)  Emergency  Shelter [facility] 762-025-1580  [facility] [place] [facility] 204-862-7487 [facility] [phone]  MRSA Hotline #:   (731)012-7666    [facility] Resources  [facility]     United [employer]                          [employer] Dept. [address] [place]                       [address]      [address]  [address]  Sela Hua Phone:  [phone]                                   Phone:  [phone]                 Phone:  [place] Phone:  [place] [phone] (After Hours)

## 2011-05-26 NOTE — ED Provider Notes (Signed)
Evaluation and management procedures were performed by the PA/NP/Resident Physician under my supervision/collaboration.   Vita Currin D Taber Sweetser, MD 05/26/11 1551 

## 2011-06-05 ENCOUNTER — Telehealth: Payer: Self-pay | Admitting: Internal Medicine

## 2011-06-05 MED ORDER — MORPHINE SULFATE CR 30 MG PO TB12: 30.0000 mg | ORAL_TABLET | Freq: Two times a day (BID) | ORAL | Status: DC

## 2011-06-05 NOTE — Telephone Encounter (Signed)
Pt called and said that he had knee surgery and had to use more of this morphine (MS CONTIN) 30 MG 12 hr tablet than usual to help with pain. Pt has run out of med and is req an early refill. Pls call when ready for pick up.

## 2011-06-05 NOTE — Telephone Encounter (Signed)
Printed and will call pt when ready for pick up 

## 2011-06-06 NOTE — H&P (Signed)
[name] is an 71 y.o. male.    Chief Complaint:  Post-op Sub-Q hematoma of the right knee.   HPI: Pt is a 71 y.o. male complaining of swelling and pain of the right knee. Pt was doing quite well right after having a right knee revision. Subsequently he started to have increasing swelling and pain. Pt presented to the clinic where Dr. Charlann Boxer aspirated the area near the knee and obtained 120 cc of blood, no signs of infection.  X-rays in the clinic show the right knee revision to be in good position.  Various options are discussed with the patient. Risks, benefits and expectations were discussed with the patient. Patient understand the risks, benefits and expectations and wishes to proceed with surgery.   PCP:  Carrie Mew, MD, MD  D/C Plans:  Home with HHPT/SNF/Rehab  Post-op Meds:  No Rx given  Tranexamic Acid:   Not to be given  Decadron:   Not to be given  PMH: Past Medical History  Diagnosis Date  . Hypertension   . Hyperlipidemia   . Preoperative cardiovascular examination   . Spinal stenosis, lumbar   . CAS (cerebral atherosclerosis)   . Erectile dysfunction   . Preventative health care   . Benign prostatic hypertrophy with urinary obstruction   . Family history of early CAD     male 1st degree relative <50  . Peptic ulcer disease   . Low back pain   . Superficial spreading melanoma   . Sleep apnea, obstructive     CPAP-does not use  . HA (headache)     sinus headaches  . Arthritis     knees  . Myocardial infarction 1997    Hx MI 1997 and 1998  . Anxiety   . Pneumonia 2005  . Anemia   . Depression     takes Zoloft  . Neuropathy, autonomic, idiopathic peripheral, other   . Blood transfusion 1 yr. ago    jerking,fever-after blood transfusion  . Blood transfusion     given wrong blood  . Coronary artery disease 1998    cardiac stent/ Clearance Dr Lovell Sheehan and Jacinto Halim with note on chart  . Dysrhythmia     hx of atrial fib per ov note of 12/12   .  Bradycardia     hx of bradycardia in past per ov note of 12/12    PSH: Past Surgical History  Procedure Date  . Esophagogastroduodenoscopy 2004  . Colonoscopy 2004  . Lumbar laminectomy   . Lumbar fusion   . Knee arthroscopy     left  . Shoulder arthroscopy     right  . Knee arthroscopy     right  . Decompression of the median nerve     left wrist and hand  . Revision of tkr 2011    on left  . Coronary stent placement   . Back surgery 2005-last    lumbar x2  . Excisional total knee arthroplasty 12/25/2010    Procedure: EXCISIONAL TOTAL KNEE ARTHROPLASTY;  Surgeon: Shelda Pal;  Location: [facility];  Service: Orthopedics;  Laterality: Right;  repeat irrigation   and debridement, removal and reinsertation of spacer block  . Joint replacement 2011    left knee x 2 ; 08/2010-right knee-infected  . I&d extremity 03/25/2011    Procedure: IRRIGATION AND DEBRIDEMENT EXTREMITY;  Surgeon: Shelda Pal, MD;  Location: [facility];  Service: Orthopedics;  Laterality: Right;  . Coronary angioplasty 1998    stents  1998   . Total knee revision 05/13/2011    Procedure: TOTAL KNEE REVISION;  Surgeon: Shelda Pal, MD;  Location: [facility];  Service: Orthopedics;  Laterality: Right;  Reimplantation    Social History:  reports that he quit smoking about 17 months ago. His smoking use included Cigars. He has never used smokeless tobacco. He reports that he does not drink alcohol or use illicit drugs.  Allergies:  Allergies  Allergen Reactions  . Acetaminophen     REACTION:LIVER INFLAMMATION IN HIGH DOSES    Medications: No current facility-administered medications for this encounter.   Current Outpatient Prescriptions  Medication Sig Dispense Refill  . bisoprolol (ZEBETA) 5 MG tablet Take 2.5 mg by mouth daily after breakfast.       . diphenhydrAMINE (BENADRYL) 25 mg capsule Take 1 capsule (25 mg total) by mouth every 6 (six) hours as needed for itching, allergies or sleep.  30 capsule    .  ferrous sulfate 325 (65 FE) MG tablet Take 1 tablet (325 mg total) by mouth 3 (three) times daily after meals.      Marland Kitchen morphine (MS CONTIN) 30 MG 12 hr tablet Take 1 tablet (30 mg total) by mouth 2 (two) times daily.  60 tablet  0  . simvastatin (ZOCOR) 40 MG tablet Take 40 mg by mouth at bedtime.      Marland Kitchen DISCONTD: gabapentin (NEURONTIN) 300 MG capsule Take 300 mg by mouth 3 (three) times daily as needed. Pain       . DISCONTD: promethazine (PHENERGAN) 25 MG tablet       . DISCONTD: zolpidem (AMBIEN) 5 MG tablet Take 1 tablet (5 mg total) by mouth at bedtime as needed for sleep.  20 tablet  0    ROS: Review of Systems  Constitutional: Negative.  Negative for fever, chills and malaise/fatigue.  HENT: Negative.   Eyes: Negative.   Respiratory: Negative.   Cardiovascular: Negative.   Gastrointestinal: Negative.   Genitourinary: Negative.   Musculoskeletal: Positive for joint pain.  Skin: Negative.   Neurological: Negative.   Endo/Heme/Allergies: Negative.   Psychiatric/Behavioral: The patient is nervous/anxious.      Physical Exam: Physical Exam  Constitutional: He is oriented to person, place, and time and well-developed, well-nourished, and in no distress.  HENT:  Head: Normocephalic and atraumatic.  Nose: Nose normal.  Mouth/Throat: Oropharynx is clear and moist.  Eyes: Pupils are equal, round, and reactive to light.  Neck: Neck supple. No JVD present. No tracheal deviation present. No thyromegaly present.  Cardiovascular: Normal rate, regular rhythm, normal heart sounds and intact distal pulses.   Pulmonary/Chest: Effort normal and breath sounds normal.  Abdominal: Soft. There is no tenderness. There is no guarding.  Musculoskeletal:       Right knee: He exhibits decreased range of motion, swelling, effusion and laceration (Healed, post-surgical). He exhibits no ecchymosis and no deformity. tenderness found.  Lymphadenopathy:    He has no cervical adenopathy.  Neurological: He  is alert and oriented to person, place, and time.  Skin: Skin is warm and dry.  Psychiatric: Affect normal.      Assessment/Plan Assessment:   Post-op Sub-Q hematoma of the right knee.  Plan: Patient will undergo a I&D of the right knee Sub-Q hematoma on 06/10/2011. Risks benefits and expectation were discussed with the patient. Patient understand risks, benefits and expectation and wishes to proceed.   Anastasio Auerbach Malyah Ohlrich   PAC  06/06/2011, 6:02 PM

## 2011-06-07 ENCOUNTER — Encounter (HOSPITAL_COMMUNITY): Payer: Self-pay | Admitting: *Deleted

## 2011-06-10 ENCOUNTER — Encounter (HOSPITAL_COMMUNITY): Payer: Self-pay | Admitting: *Deleted

## 2011-06-10 ENCOUNTER — Encounter (HOSPITAL_COMMUNITY)
Admission: RE | Disposition: A | Discharge status: Home / Self Care | Payer: Self-pay | Source: Ambulatory Visit | Attending: Orthopedic Surgery

## 2011-06-10 ENCOUNTER — Ambulatory Visit (HOSPITAL_COMMUNITY)
Admission: RE | Admit: 2011-06-10 | Discharge: 2011-06-10 | Disposition: A | Discharge status: Home / Self Care | Payer: Medicare Other | Source: Ambulatory Visit | Attending: Orthopedic Surgery | Admitting: Orthopedic Surgery

## 2011-06-10 ENCOUNTER — Encounter (HOSPITAL_COMMUNITY): Payer: Self-pay | Admitting: Anesthesiology

## 2011-06-10 DIAGNOSIS — E785 Hyperlipidemia, unspecified: Secondary | ICD-10-CM | POA: Insufficient documentation

## 2011-06-10 DIAGNOSIS — I10 Essential (primary) hypertension: Secondary | ICD-10-CM | POA: Insufficient documentation

## 2011-06-10 DIAGNOSIS — T8489XA Other specified complication of internal orthopedic prosthetic devices, implants and grafts, initial encounter: Secondary | ICD-10-CM | POA: Insufficient documentation

## 2011-06-10 DIAGNOSIS — Z96659 Presence of unspecified artificial knee joint: Secondary | ICD-10-CM | POA: Insufficient documentation

## 2011-06-10 DIAGNOSIS — Z538 Procedure and treatment not carried out for other reasons: Secondary | ICD-10-CM | POA: Insufficient documentation

## 2011-06-10 DIAGNOSIS — Y831 Surgical operation with implant of artificial internal device as the cause of abnormal reaction of the patient, or of later complication, without mention of misadventure at the time of the procedure: Secondary | ICD-10-CM | POA: Insufficient documentation

## 2011-06-10 LAB — DIFFERENTIAL
Basophils Relative: 1 % (ref 0–1)
Eosinophils Absolute: 0.3 10*3/uL (ref 0.0–0.7)
Monocytes Relative: 5 % (ref 3–12)
Neutrophils Relative %: 66 % (ref 43–77)

## 2011-06-10 LAB — BASIC METABOLIC PANEL
BUN: 15 mg/dL (ref 6–23)
Calcium: 9.6 mg/dL (ref 8.4–10.5)
GFR calc Af Amer: 78 mL/min — ABNORMAL LOW (ref >90)
GFR calc non Af Amer: 67 mL/min — ABNORMAL LOW (ref >90)
Potassium: 4.2 mEq/L (ref 3.5–5.1)
Sodium: 134 mEq/L — ABNORMAL LOW (ref 135–145)

## 2011-06-10 LAB — URINALYSIS, ROUTINE W REFLEX MICROSCOPIC
Bilirubin Urine: NEGATIVE
Hgb urine dipstick: NEGATIVE
Ketones, ur: NEGATIVE mg/dL
Nitrite: NEGATIVE
pH: 8.5 — ABNORMAL HIGH (ref 5.0–8.0)

## 2011-06-10 LAB — SURGICAL PCR SCREEN
MRSA, PCR: NEGATIVE
Staphylococcus aureus: NEGATIVE

## 2011-06-10 LAB — CBC
MCH: 27.9 pg (ref 26.0–34.0)
MCHC: 31.7 g/dL (ref 30.0–36.0)
Platelets: 211 10*3/uL (ref 150–400)

## 2011-06-10 LAB — TYPE AND SCREEN

## 2011-06-10 LAB — APTT: aPTT: 35 seconds (ref 24–37)

## 2011-06-10 SURGERY — REMOVAL, TOTAL ARTHROPLASTY HARDWARE, KNEE, WITH ANTIBIOTIC SPACER INSERTION
Anesthesia: General | Wound class: Clean Contaminated

## 2011-06-10 MED ORDER — CEFAZOLIN SODIUM-DEXTROSE 2-3 GM-% IV SOLR: 2.0000 g | INTRAVENOUS | Status: DC

## 2011-06-10 MED ORDER — CHLORHEXIDINE GLUCONATE 4 % EX LIQD: 60.0000 mL | Freq: Once | CUTANEOUS | Status: DC

## 2011-06-10 MED ORDER — CEFAZOLIN SODIUM 1-5 GM-% IV SOLN
INTRAVENOUS | Status: AC
  Filled 2011-06-10: qty 100

## 2011-06-10 MED ORDER — MUPIROCIN 2 % EX OINT
TOPICAL_OINTMENT | CUTANEOUS | Status: AC
  Filled 2011-06-10: qty 22

## 2011-06-10 MED ORDER — SODIUM CHLORIDE 0.9 % IR SOLN
Status: DC | PRN
  Administered 2011-06-10: 3000 mL

## 2011-06-10 SURGICAL SUPPLY — 63 items
ADH SKN CLS APL DERMABOND .7 (GAUZE/BANDAGES/DRESSINGS)
BAG SPEC THK2 15X12 ZIP CLS (MISCELLANEOUS)
BAG ZIPLOCK 12X15 (MISCELLANEOUS) ×1 IMPLANT
BANDAGE ELASTIC 6 VELCRO ST LF (GAUZE/BANDAGES/DRESSINGS) ×1 IMPLANT
BANDAGE ESMARK 6X9 LF (GAUZE/BANDAGES/DRESSINGS) ×1 IMPLANT
BLADE SAW SGTL 13.0X1.19X90.0M (BLADE) ×1 IMPLANT
BLADE SAW SGTL 81X20 HD (BLADE) ×1 IMPLANT
BNDG CMPR 9X6 STRL LF SNTH (GAUZE/BANDAGES/DRESSINGS)
BNDG ESMARK 6X9 LF (GAUZE/BANDAGES/DRESSINGS)
BOWL SMART MIX CTS (DISPOSABLE) IMPLANT
CLOTH BEACON ORANGE TIMEOUT ST (SAFETY) ×1 IMPLANT
CUFF TOURN SGL QUICK 34 (TOURNIQUET CUFF)
CUFF TRNQT CYL 34X4X40X1 (TOURNIQUET CUFF) ×1 IMPLANT
DERMABOND ADVANCED (GAUZE/BANDAGES/DRESSINGS)
DERMABOND ADVANCED .7 DNX12 (GAUZE/BANDAGES/DRESSINGS) ×1 IMPLANT
DRAPE EXTREMITY T 121X128X90 (DRAPE) ×1 IMPLANT
DRAPE POUCH INSTRU U-SHP 10X18 (DRAPES) ×1 IMPLANT
DRAPE U-SHAPE 47X51 STRL (DRAPES) ×1 IMPLANT
DRSG ADAPTIC 3X8 NADH LF (GAUZE/BANDAGES/DRESSINGS) ×1 IMPLANT
DRSG AQUACEL AG ADV 3.5X10 (GAUZE/BANDAGES/DRESSINGS) ×1 IMPLANT
DRSG PAD ABDOMINAL 8X10 ST (GAUZE/BANDAGES/DRESSINGS) ×1 IMPLANT
DRSG TEGADERM 4X4.75 (GAUZE/BANDAGES/DRESSINGS) ×1 IMPLANT
DURAPREP 26ML APPLICATOR (WOUND CARE) ×1 IMPLANT
ELECT REM PT RETURN 9FT ADLT (ELECTROSURGICAL)
ELECTRODE REM PT RTRN 9FT ADLT (ELECTROSURGICAL) ×1 IMPLANT
EVACUATOR 1/8 PVC DRAIN (DRAIN) ×1 IMPLANT
FACESHIELD LNG OPTICON STERILE (SAFETY) ×5 IMPLANT
GAUZE SPONGE 2X2 8PLY STRL LF (GAUZE/BANDAGES/DRESSINGS) ×1 IMPLANT
GLOVE BIOGEL PI IND STRL 7.5 (GLOVE) ×1 IMPLANT
GLOVE BIOGEL PI IND STRL 8 (GLOVE) ×2 IMPLANT
GLOVE BIOGEL PI INDICATOR 7.5 (GLOVE)
GLOVE BIOGEL PI INDICATOR 8 (GLOVE)
GLOVE ORTHO TXT STRL SZ7.5 (GLOVE) ×2 IMPLANT
GLOVE SURG ORTHO 8.0 STRL STRW (GLOVE) ×1 IMPLANT
GOWN BRE IMP PREV XXLGXLNG (GOWN DISPOSABLE) ×2 IMPLANT
GOWN STRL NON-REIN LRG LVL3 (GOWN DISPOSABLE) ×1 IMPLANT
HANDPIECE INTERPULSE COAX TIP (DISPOSABLE)
IMMOBILIZER KNEE 20 (SOFTGOODS)
IMMOBILIZER KNEE 20 THIGH 36 (SOFTGOODS) IMPLANT
KIT BASIN OR (CUSTOM PROCEDURE TRAY) ×1 IMPLANT
MANIFOLD NEPTUNE II (INSTRUMENTS) ×1 IMPLANT
NDL SAFETY ECLIPSE 18X1.5 (NEEDLE) ×1 IMPLANT
NEEDLE HYPO 18GX1.5 SHARP (NEEDLE)
NS IRRIG 1000ML POUR BTL (IV SOLUTION) ×1 IMPLANT
PACK TOTAL JOINT (CUSTOM PROCEDURE TRAY) ×1 IMPLANT
PADDING CAST COTTON 6X4 STRL (CAST SUPPLIES) ×2 IMPLANT
POSITIONER SURGICAL ARM (MISCELLANEOUS) ×1 IMPLANT
SET HNDPC FAN SPRY TIP SCT (DISPOSABLE) ×1 IMPLANT
SET PAD KNEE POSITIONER (MISCELLANEOUS) ×1 IMPLANT
SPONGE GAUZE 2X2 STER 10/PKG (GAUZE/BANDAGES/DRESSINGS)
SPONGE GAUZE 4X4 12PLY (GAUZE/BANDAGES/DRESSINGS) ×2 IMPLANT
SPONGE LAP 18X18 X RAY DECT (DISPOSABLE) ×1 IMPLANT
STAPLER VISISTAT 35W (STAPLE) IMPLANT
SUCTION FRAZIER 12FR DISP (SUCTIONS) ×1 IMPLANT
SUT VIC AB 1 CT1 36 (SUTURE) ×3 IMPLANT
SUT VIC AB 2-0 CT1 27 (SUTURE)
SUT VIC AB 2-0 CT1 TAPERPNT 27 (SUTURE) ×3 IMPLANT
SYR 50ML LL SCALE MARK (SYRINGE) ×1 IMPLANT
TOWEL OR 17X26 10 PK STRL BLUE (TOWEL DISPOSABLE) ×2 IMPLANT
TOWER CARTRIDGE SMART MIX (DISPOSABLE) ×1 IMPLANT
TRAY FOLEY CATH 14FRSI W/METER (CATHETERS) ×1 IMPLANT
WATER STERILE IRR 1500ML POUR (IV SOLUTION) ×1 IMPLANT
WRAP KNEE MAXI GEL POST OP (GAUZE/BANDAGES/DRESSINGS) ×1 IMPLANT

## 2011-06-10 NOTE — Progress Notes (Signed)
Dr Charlann Boxer MD is canceling surgery. No IV started patient to be transferred back to Short Stay and to be discharged.  Dr Charlann Boxer MD will be ordering discharge.

## 2011-06-11 ENCOUNTER — Encounter: Payer: Self-pay | Admitting: Internal Medicine

## 2011-06-28 ENCOUNTER — Encounter: Payer: Self-pay | Admitting: Internal Medicine

## 2011-06-28 ENCOUNTER — Ambulatory Visit (INDEPENDENT_AMBULATORY_CARE_PROVIDER_SITE_OTHER): Payer: Medicare Other | Admitting: Internal Medicine

## 2011-06-28 ENCOUNTER — Other Ambulatory Visit: Payer: Self-pay | Admitting: *Deleted

## 2011-06-28 VITALS — BP 156/82 | HR 76 | Temp 98.0°F | Resp 18 | Ht 69.5 in | Wt 186.0 lb

## 2011-06-28 DIAGNOSIS — Z85820 Personal history of malignant melanoma of skin: Secondary | ICD-10-CM

## 2011-06-28 DIAGNOSIS — I2510 Atherosclerotic heart disease of native coronary artery without angina pectoris: Secondary | ICD-10-CM

## 2011-06-28 DIAGNOSIS — I10 Essential (primary) hypertension: Secondary | ICD-10-CM

## 2011-06-28 DIAGNOSIS — L858 Other specified epidermal thickening: Secondary | ICD-10-CM

## 2011-06-28 DIAGNOSIS — R51 Headache: Secondary | ICD-10-CM

## 2011-06-28 DIAGNOSIS — D62 Acute posthemorrhagic anemia: Secondary | ICD-10-CM

## 2011-06-28 DIAGNOSIS — D485 Neoplasm of uncertain behavior of skin: Secondary | ICD-10-CM

## 2011-06-28 MED ORDER — AZELASTINE-FLUTICASONE 137-50 MCG/ACT NA SUSP: 1.0000 | Freq: Two times a day (BID) | NASAL | Status: DC

## 2011-06-28 MED ORDER — MORPHINE SULFATE ER 30 MG PO TBCR: 30.0000 mg | EXTENDED_RELEASE_TABLET | Freq: Two times a day (BID) | ORAL | Status: DC

## 2011-06-28 MED ORDER — MORPHINE SULFATE ER BEADS 60 MG PO CP24: 60.0000 mg | ORAL_CAPSULE | Freq: Every day | ORAL | Status: DC

## 2011-06-28 NOTE — Progress Notes (Signed)
Subjective:    Patient ID: [name], male    DOB: March 25, 1940, 71 y.o.   MRN: [id number]  HPI Patient's had multiple knee replacements with the requirement for total knee replacement removal x2 placement of antibiotic spacers and finally had complete knee replacement on March 25. He has a history of hyperlipidemia he has a history of coronary artery disease history of melanoma Reviewed pain control and discussion of MS contin use. Awakens with sinus HA in the AM    Review of Systems  Constitutional: Positive for fatigue. Negative for fever.  HENT: Negative for hearing loss, congestion, neck pain and postnasal drip.   Eyes: Negative for discharge, redness and visual disturbance.  Respiratory: Negative for cough, shortness of breath and wheezing.   Cardiovascular: Negative for leg swelling.  Gastrointestinal: Negative for abdominal pain, constipation and abdominal distention.  Genitourinary: Negative for urgency and frequency.  Musculoskeletal: Positive for myalgias, back pain and joint swelling. Negative for arthralgias.  Skin: Negative for color change and rash.  Neurological: Positive for weakness and headaches. Negative for light-headedness.  Hematological: Negative for adenopathy.  Psychiatric/Behavioral: Negative for behavioral problems.   Past Medical History  Diagnosis Date  . Hypertension   . Hyperlipidemia   . Preoperative cardiovascular examination   . Spinal stenosis, lumbar   . CAS (cerebral atherosclerosis)   . Erectile dysfunction   . Preventative health care   . Benign prostatic hypertrophy with urinary obstruction   . Family history of early CAD     male 1st degree relative <50  . Peptic ulcer disease   . Low back pain   . Superficial spreading melanoma   . Sleep apnea, obstructive     CPAP-does not use  . HA (headache)     sinus headaches  . Arthritis     knees  . Myocardial infarction 1997    Hx MI 1997 and 1998  . Anxiety   . Pneumonia 2005    . Anemia   . Depression     takes Zoloft  . Neuropathy, autonomic, idiopathic peripheral, other   . Blood transfusion 1 yr. ago    jerking,fever-after blood transfusion  . Blood transfusion     given wrong blood  . Coronary artery disease 1998    cardiac stent/ Clearance Dr Lovell Sheehan and Jacinto Halim with note on chart  . Dysrhythmia     hx of atrial fib per ov note of 12/12   . Bradycardia     hx of bradycardia in past per ov note of 12/12    History   Social History  . Marital Status: Married    Spouse Name: N/A    Number of Children: N/A  . Years of Education: N/A   Occupational History  . diesel repair     part time  . worked at [employer] History Main Topics  . Smoking status: Former Smoker -- 1.0 packs/day for 15 years    Types: Cigars    Quit date: 12/23/2009  . Smokeless tobacco: Never Used   Comment: smoked a pipe for 6-7 y ears  . Alcohol Use: No  . Drug Use: No  . Sexually Active: Not on file   Other Topics Concern  . Not on file   Social History Narrative  . No narrative on file    Past Surgical History  Procedure Date  . Esophagogastroduodenoscopy 2004  . Colonoscopy 2004  . Lumbar laminectomy   . Lumbar fusion   . Knee  arthroscopy     left  . Shoulder arthroscopy     right  . Knee arthroscopy     right  . Decompression of the median nerve     left wrist and hand  . Revision of tkr 2011    on left  . Coronary stent placement   . Back surgery 2005-last    lumbar x2  . Excisional total knee arthroplasty 12/25/2010    Procedure: EXCISIONAL TOTAL KNEE ARTHROPLASTY;  Surgeon: Shelda Pal;  [facility]: [facility];  Service: Orthopedics;  Laterality: Right;  repeat irrigation   and debridement, removal and reinsertation of spacer block  . Joint replacement 2011    left knee x 2 ; 08/2010-right knee-infected  . I&d extremity 03/25/2011    Procedure: IRRIGATION AND DEBRIDEMENT EXTREMITY;  Surgeon: Shelda Pal, MD;  Location: [facility];  Service:  Orthopedics;  Laterality: Right;  . Coronary angioplasty 1998    stents 1998   . Total knee revision 05/13/2011    Procedure: TOTAL KNEE REVISION;  Surgeon: Shelda Pal, MD;  Location: [facility];  Service: Orthopedics;  Laterality: Right;  Reimplantation    No family history on file.  Allergies  Allergen Reactions  . Acetaminophen     REACTION:LIVER INFLAMMATION IN HIGH DOSES    Current Outpatient Prescriptions on File Prior to Visit  Medication Sig Dispense Refill  . bisoprolol (ZEBETA) 5 MG tablet Take 2.5 mg by mouth daily after breakfast.       . ferrous sulfate 325 (65 FE) MG tablet Take 1 tablet (325 mg total) by mouth 3 (three) times daily after meals.      Marland Kitchen lisinopril (PRINIVIL,ZESTRIL) 20 MG tablet Take 10 mg by mouth daily.      . promethazine (PHENERGAN) 12.5 MG tablet Take 25 mg by mouth every 6 (six) hours as needed.      . simvastatin (ZOCOR) 40 MG tablet Take 40 mg by mouth at bedtime.      Marland Kitchen DISCONTD: diphenhydrAMINE (BENADRYL) 25 mg capsule Take 1 capsule (25 mg total) by mouth every 6 (six) hours as needed for itching, allergies or sleep.  30 capsule    . DISCONTD: gabapentin (NEURONTIN) 300 MG capsule Take 300 mg by mouth 3 (three) times daily as needed. Pain       . DISCONTD: zolpidem (AMBIEN) 5 MG tablet Take 1 tablet (5 mg total) by mouth at bedtime as needed for sleep.  20 tablet  0    BP 156/82  Pulse 76  Temp 98 F (36.7 C)  Resp 18  Ht 5' 9.5" (1.765 m)  Wt 186 lb (84.369 kg)  BMI 27.07 kg/m2       Objective:   Physical Exam  Nursing note and vitals reviewed. Constitutional: He is oriented to person, place, and time. He appears well-developed and well-nourished.  HENT:  Head: Normocephalic and atraumatic.  Eyes: Conjunctivae are normal. Pupils are equal, round, and reactive to light.  Neck: Normal range of motion. Neck supple.  Cardiovascular: Normal rate and regular rhythm.   Pulmonary/Chest: Effort normal and breath sounds normal.    Abdominal: Soft. Bowel sounds are normal.  Neurological: He is alert and oriented to person, place, and time.  Skin: Skin is warm and dry.          Assessment & Plan:  Looks well he has done significantly better than might one might expect for the all the surgeries he has gone through recently with his knee he  still has issues of chronic back pain and we are going to try to simplify his regimen by using a 24-hour morphine.   his blood pressure is in good control he had some iron deficiency anemia post surgery were to monitor and iron and a CBC today and see if he needs to continue the iron or if he can stop the iron at this time.  He has chronic sinus headaches he awakens with in the morning and we're going to try a nasal corticosteroid and antihistamine combination and he will call back to [place] at the protective and will call in a prescription

## 2011-06-28 NOTE — Progress Notes (Signed)
Addended by: Rita Ohara R on: 06/28/2011 04:14 PM   Modules accepted: Orders

## 2011-06-29 LAB — IRON: Iron: 41 ug/dL — ABNORMAL LOW (ref 42–165)

## 2011-06-29 LAB — CBC WITH DIFFERENTIAL/PLATELET
Basophils Absolute: 0 10*3/uL (ref 0.0–0.1)
Basophils Relative: 0 % (ref 0–1)
Eosinophils Absolute: 0.3 10*3/uL (ref 0.0–0.7)
Hemoglobin: 11.9 g/dL — ABNORMAL LOW (ref 13.0–17.0)
MCH: 28 pg (ref 26.0–34.0)
MCHC: 31.6 g/dL (ref 30.0–36.0)
Neutro Abs: 10.6 10*3/uL — ABNORMAL HIGH (ref 1.7–7.7)
Neutrophils Relative %: 77 % (ref 43–77)
Platelets: 238 10*3/uL (ref 150–400)
RDW: 16.2 % — ABNORMAL HIGH (ref 11.5–15.5)

## 2011-07-16 ENCOUNTER — Telehealth: Payer: Self-pay | Admitting: Internal Medicine

## 2011-07-16 MED ORDER — AZELASTINE-FLUTICASONE 137-50 MCG/ACT NA SUSP: 137.0000 ug | Freq: Two times a day (BID) | NASAL | Status: DC

## 2011-07-16 NOTE — Telephone Encounter (Signed)
Patient called stating that the  Dymista sample given by the MD worked fine and would like to have an rx called into his pharmacy. Please assist.

## 2011-07-16 NOTE — Telephone Encounter (Signed)
done

## 2011-07-18 ENCOUNTER — Other Ambulatory Visit: Payer: Self-pay | Admitting: Internal Medicine

## 2011-08-05 ENCOUNTER — Telehealth: Payer: Self-pay | Admitting: Internal Medicine

## 2011-08-05 MED ORDER — BACLOFEN 10 MG PO TABS: 10.0000 mg | ORAL_TABLET | Freq: Three times a day (TID) | ORAL | Status: AC

## 2011-08-05 MED ORDER — GABAPENTIN 300 MG PO CAPS: 300.0000 mg | ORAL_CAPSULE | Freq: Three times a day (TID) | ORAL | Status: DC

## 2011-08-05 NOTE — Telephone Encounter (Signed)
Stop robaxin and make sure pt is taking nuerontin 300 tid - start bacofen 10 tid -pt informed and med will be sent to pharmacy- pt has not been taking neruontin

## 2011-08-05 NOTE — Telephone Encounter (Signed)
Pt states he recently started on 24h Morphine and it is not working. Pt also states that he is having bad back and leg spasms and Morphine is not helping. Pt would like a change in RX please contact

## 2011-08-07 ENCOUNTER — Other Ambulatory Visit: Payer: Self-pay | Admitting: Orthopedic Surgery

## 2011-08-07 ENCOUNTER — Telehealth: Payer: Self-pay | Admitting: Internal Medicine

## 2011-08-07 DIAGNOSIS — M4800 Spinal stenosis, site unspecified: Secondary | ICD-10-CM

## 2011-08-07 MED ORDER — MORPHINE SULFATE ER BEADS 90 MG PO CP24: 90.0000 mg | ORAL_CAPSULE | Freq: Every day | ORAL | Status: DC

## 2011-08-07 NOTE — Telephone Encounter (Signed)
Change to avinza 90 mg

## 2011-08-07 NOTE — Telephone Encounter (Signed)
Patient called stating that his back pain has become unbearable, however the spasms and cramps have cleared up and would like to have something call into [facility]. Please advise.

## 2011-08-08 ENCOUNTER — Other Ambulatory Visit: Payer: Self-pay | Admitting: Physical Medicine and Rehabilitation

## 2011-08-08 DIAGNOSIS — M4800 Spinal stenosis, site unspecified: Secondary | ICD-10-CM

## 2011-08-12 ENCOUNTER — Ambulatory Visit
Admission: RE | Admit: 2011-08-12 | Discharge: 2011-08-12 | Disposition: A | Discharge status: Home / Self Care | Payer: Medicare Other | Source: Ambulatory Visit | Attending: Orthopedic Surgery | Admitting: Orthopedic Surgery

## 2011-08-12 VITALS — BP 133/81 | HR 55

## 2011-08-12 DIAGNOSIS — M545 Low back pain, unspecified: Secondary | ICD-10-CM

## 2011-08-12 DIAGNOSIS — M48061 Spinal stenosis, lumbar region without neurogenic claudication: Secondary | ICD-10-CM

## 2011-08-12 DIAGNOSIS — M4800 Spinal stenosis, site unspecified: Secondary | ICD-10-CM

## 2011-08-12 MED ORDER — DIAZEPAM 5 MG PO TABS
5.0000 mg | ORAL_TABLET | Freq: Once | ORAL | Status: AC
  Administered 2011-08-12: 5 mg via ORAL

## 2011-08-12 MED ORDER — IOHEXOL 180 MG/ML  SOLN
18.0000 mL | Freq: Once | INTRAMUSCULAR | Status: AC | PRN
  Administered 2011-08-12: 18 mL via INTRATHECAL

## 2011-08-12 MED ORDER — ONDANSETRON HCL 4 MG/2ML IJ SOLN: 4.0000 mg | Freq: Four times a day (QID) | INTRAMUSCULAR | Status: DC | PRN

## 2011-08-12 NOTE — Progress Notes (Addendum)
Patient states he has been off Promethazine for the past two days. Donell Sievert, RN

## 2011-08-12 NOTE — Discharge Instructions (Signed)
Myelogram Discharge Instructions  1. Go home and rest quietly for the next 24 hours.  It is important to lie flat for the next 24 hours.  Get up only to go to the restroom.  You may lie in the bed or on a couch on your back, your stomach, your left side or your right side.  You may have one pillow under your head.  You may have pillows between your knees while you are on your side or under your knees while you are on your back.  2. DO NOT drive today.  Recline the seat as far back as it will go, while still wearing your seat belt, on the way home.  3. You may get up to go to the bathroom as needed.  You may sit up for 10 minutes to eat.  You may resume your normal diet and medications unless otherwise indicated.  Drink lots of extra fluids today and tomorrow.  4. The incidence of headache, nausea, or vomiting is about 5% (one in 20 patients).  If you develop a headache, lie flat and drink plenty of fluids until the headache goes away.  Caffeinated beverages may be helpful.  If you develop severe nausea and vomiting or a headache that does not go away with flat bed rest, call [phone].  5. You may resume normal activities after your 24 hours of bed rest is over; however, do not exert yourself strongly or do any heavy lifting tomorrow. If when you get up you have a headache when standing, go back to bed and force fluids for another 24 hours.  6. Call your physician for a follow-up appointment.  The results of your myelogram will be sent directly to your physician by the following day.  7. If you have any questions or if complications develop after you arrive home, please call [phone].  Discharge instructions have been explained to the patient.  The patient, or the person responsible for the patient, fully understands these instructions.       May resume promethazine on August 13, 2011, after 8:30 am.

## 2011-08-13 ENCOUNTER — Telehealth: Payer: Self-pay | Admitting: Internal Medicine

## 2011-08-13 NOTE — Telephone Encounter (Signed)
Caller: [name]/Patient; PCP: Darryll Capers; CB#: [phone];  Call regarding Back Pain; has had chronic back pain but pain has increased over past 48 hours.  Using Morphine 24 hour pill, which helps, but does not cover this flare.  States is to leave on vacation 08/15/11.  Had myelogram done 08/12/11 but does not have results back yet. Has not been able to tolerate muscle relexant.  Would like new Rx to help during the flare so he does not have to cancel his vacation.   Declines new triage; requesting appt so he can be examined.  Per patient request, appt sched 08/14/11 1445 with Dr. Lovell Sheehan.

## 2011-08-13 NOTE — Telephone Encounter (Signed)
Dr jenkins is aware 

## 2011-08-14 ENCOUNTER — Encounter: Payer: Self-pay | Admitting: Internal Medicine

## 2011-08-14 ENCOUNTER — Ambulatory Visit (INDEPENDENT_AMBULATORY_CARE_PROVIDER_SITE_OTHER): Payer: Medicare Other | Admitting: Internal Medicine

## 2011-08-14 VITALS — BP 160/90 | HR 72 | Temp 98.0°F | Resp 18 | Ht 70.0 in | Wt 186.0 lb

## 2011-08-14 DIAGNOSIS — G8929 Other chronic pain: Secondary | ICD-10-CM

## 2011-08-14 MED ORDER — MORPHINE SULFATE 15 MG PO TABS: ORAL_TABLET | ORAL | Status: DC

## 2011-08-14 NOTE — Patient Instructions (Signed)
Stay on the long-acting morphine called Avinza taking it once a day he may use the immediate acting breakthrough medications of to 3 times a day.  These are also morphine in the same family as the Avinza and do not contain Tylenol or any other nonsteroidals

## 2011-08-14 NOTE — Progress Notes (Signed)
Subjective:    Patient ID: [name], male    DOB: 1940-12-20, 71 y.o.   MRN: [id number]  HPI  The pt has increased the avinza to 90 and still has pain. We increased the morphine about 4 weeks ago He is travelling and the prospect of pain has him concerned He does not feel that the medications are causing side effects other that constipation  Review of Systems  Constitutional: Positive for activity change and appetite change.  HENT: Positive for neck pain and neck stiffness.   Respiratory: Negative for apnea, chest tightness and shortness of breath.   Musculoskeletal: Positive for myalgias, back pain, joint swelling and gait problem.   Past Medical History  Diagnosis Date  . Hypertension   . Hyperlipidemia   . Preoperative cardiovascular examination   . Spinal stenosis, lumbar   . CAS (cerebral atherosclerosis)   . Erectile dysfunction   . Preventative health care   . Benign prostatic hypertrophy with urinary obstruction   . Family history of early CAD     male 1st degree relative <50  . Peptic ulcer disease   . Low back pain   . Superficial spreading melanoma   . Sleep apnea, obstructive     CPAP-does not use  . HA (headache)     sinus headaches  . Arthritis     knees  . Myocardial infarction 1997    Hx MI 1997 and 1998  . Anxiety   . Pneumonia 2005  . Anemia   . Depression     takes Zoloft  . Neuropathy, autonomic, idiopathic peripheral, other   . Blood transfusion 1 yr. ago    jerking,fever-after blood transfusion  . Blood transfusion     given wrong blood  . Coronary artery disease 1998    cardiac stent/ Clearance Dr Lovell Sheehan and Jacinto Halim with note on chart  . Dysrhythmia     hx of atrial fib per ov note of 12/12   . Bradycardia     hx of bradycardia in past per ov note of 12/12    History   Social History  . Marital Status: Married    Spouse Name: N/A    Number of Children: N/A  . Years of Education: N/A   Occupational History  . diesel repair      part time  . worked at [employer] History Main Topics  . Smoking status: Former Smoker -- 1.0 packs/day for 15 years    Types: Cigars    Quit date: 12/23/2009  . Smokeless tobacco: Never Used   Comment: smoked a pipe for 6-7 y ears  . Alcohol Use: No  . Drug Use: No  . Sexually Active: Not on file   Other Topics Concern  . Not on file   Social History Narrative  . No narrative on file    Past Surgical History  Procedure Date  . Esophagogastroduodenoscopy 2004  . Colonoscopy 2004  . Lumbar laminectomy   . Lumbar fusion   . Knee arthroscopy     left  . Shoulder arthroscopy     right  . Knee arthroscopy     right  . Decompression of the median nerve     left wrist and hand  . Revision of tkr 2011    on left  . Coronary stent placement   . Back surgery 2005-last    lumbar x2  . Excisional total knee arthroplasty 12/25/2010    Procedure: EXCISIONAL TOTAL KNEE  ARTHROPLASTY;  Surgeon: Shelda Pal;  Location: [facility];  Service: Orthopedics;  Laterality: Right;  repeat irrigation   and debridement, removal and reinsertation of spacer block  . Joint replacement 2011    left knee x 2 ; 08/2010-right knee-infected  . I&d extremity 03/25/2011    Procedure: IRRIGATION AND DEBRIDEMENT EXTREMITY;  Surgeon: Shelda Pal, MD;  Location: [facility];  Service: Orthopedics;  Laterality: Right;  . Coronary angioplasty 1998    stents 1998   . Total knee revision 05/13/2011    Procedure: TOTAL KNEE REVISION;  Surgeon: Shelda Pal, MD;  Location: [facility];  Service: Orthopedics;  Laterality: Right;  Reimplantation    No family history on file.  Allergies  Allergen Reactions  . Acetaminophen Other (See Comments)    LIVER INFLAMMATION IN HIGH DOSES    Current Outpatient Prescriptions on File Prior to Visit  Medication Sig Dispense Refill  . bisoprolol (ZEBETA) 5 MG tablet Take 2.5 mg by mouth daily after breakfast.       . lisinopril (PRINIVIL,ZESTRIL) 20 MG tablet Take 10 mg  by mouth daily.      . simvastatin (ZOCOR) 40 MG tablet Take 40 mg by mouth at bedtime.      . gabapentin (NEURONTIN) 400 MG capsule Take 400 mg by mouth 3 (three) times daily.      Marland Kitchen DISCONTD: zolpidem (AMBIEN) 5 MG tablet Take 1 tablet (5 mg total) by mouth at bedtime as needed for sleep.  20 tablet  0    BP 160/90  Pulse 72  Temp 98 F (36.7 C)  Resp 18  Ht 5\' 10"  (1.778 m)  Wt 186 lb (84.369 kg)  BMI 26.69 kg/m2       Objective:   Physical Exam  Nursing note and vitals reviewed. Constitutional: He appears well-developed and well-nourished.  HENT:  Head: Normocephalic and atraumatic.  Eyes: Conjunctivae normal are normal. Pupils are equal, round, and reactive to light.  Neck: Normal range of motion. Neck supple.  Cardiovascular: Normal rate and regular rhythm.   Pulmonary/Chest: Effort normal and breath sounds normal.  Abdominal: Soft. Bowel sounds are normal.          Assessment & Plan:  We will change the Avinza to MS Contin 30 mg that he can use 3 times a day and titrate to as needed for pain control we will use immediate release morphine as a breakthrough pain medication We spent 30 minutes face-to-face examining the patient and over how that was in counseling about the potential effects of chronic pain medications and the risk of falls and addiction.

## 2011-09-03 ENCOUNTER — Encounter: Payer: Self-pay | Admitting: Internal Medicine

## 2011-09-03 ENCOUNTER — Ambulatory Visit (INDEPENDENT_AMBULATORY_CARE_PROVIDER_SITE_OTHER): Payer: Medicare Other | Admitting: Internal Medicine

## 2011-09-03 VITALS — BP 146/86 | HR 80 | Temp 98.0°F | Resp 16 | Ht 70.0 in | Wt 182.0 lb

## 2011-09-03 DIAGNOSIS — T887XXA Unspecified adverse effect of drug or medicament, initial encounter: Secondary | ICD-10-CM

## 2011-09-03 DIAGNOSIS — G8929 Other chronic pain: Secondary | ICD-10-CM

## 2011-09-03 DIAGNOSIS — W010XXA Fall on same level from slipping, tripping and stumbling without subsequent striking against object, initial encounter: Secondary | ICD-10-CM

## 2011-09-03 MED ORDER — GABAPENTIN 400 MG PO CAPS: 400.0000 mg | ORAL_CAPSULE | Freq: Three times a day (TID) | ORAL | Status: DC

## 2011-09-03 MED ORDER — MORPHINE SULFATE 15 MG PO TABS: ORAL_TABLET | ORAL | Status: DC

## 2011-09-03 NOTE — Progress Notes (Signed)
Subjective:    Patient ID: [name], male    DOB: 07-16-1940, 71 y.o.   MRN: [id number]  HPI Patient is a 71 year old male who is status post bilateral knee replacement and is followed for chronic pain with peripheral autonomic neuropathy as well as chronic back pain.  A recent trip in which he had 3 falls that were due to user to imbalance and obstacle or sitting appropriately on an object it would not support his weight. He did not fill any of these falls were related to overmedication   Review of Systems  Constitutional: Negative for fever and fatigue.  HENT: Negative for hearing loss, congestion, neck pain and postnasal drip.   Eyes: Negative for discharge, redness and visual disturbance.  Respiratory: Negative for cough, shortness of breath and wheezing.   Cardiovascular: Negative for leg swelling.  Gastrointestinal: Negative for abdominal pain, constipation and abdominal distention.  Genitourinary: Negative for urgency and frequency.  Musculoskeletal: Negative for joint swelling and arthralgias.  Skin: Negative for color change and rash.  Neurological: Negative for weakness and light-headedness.  Hematological: Negative for adenopathy.  Psychiatric/Behavioral: Negative for behavioral problems.   Past Medical History  Diagnosis Date  . Hypertension   . Hyperlipidemia   . Preoperative cardiovascular examination   . Spinal stenosis, lumbar   . CAS (cerebral atherosclerosis)   . Erectile dysfunction   . Preventative health care   . Benign prostatic hypertrophy with urinary obstruction   . Family history of early CAD     male 1st degree relative <50  . Peptic ulcer disease   . Low back pain   . Superficial spreading melanoma   . Sleep apnea, obstructive     CPAP-does not use  . HA (headache)     sinus headaches  . Arthritis     knees  . Myocardial infarction 1997    Hx MI 1997 and 1998  . Anxiety   . Pneumonia 2005  . Anemia   . Depression     takes Zoloft    . Neuropathy, autonomic, idiopathic peripheral, other   . Blood transfusion 1 yr. ago    jerking,fever-after blood transfusion  . Blood transfusion     given wrong blood  . Coronary artery disease 1998    cardiac stent/ Clearance Dr Lovell Sheehan and Jacinto Halim with note on chart  . Dysrhythmia     hx of atrial fib per ov note of 12/12   . Bradycardia     hx of bradycardia in past per ov note of 12/12    History   Social History  . Marital Status: Married    Spouse Name: N/A    Number of Children: N/A  . Years of Education: N/A   Occupational History  . diesel repair     part time  . worked at [employer] History Main Topics  . Smoking status: Former Smoker -- 1.0 packs/day for 15 years    Types: Cigars    Quit date: 12/23/2009  . Smokeless tobacco: Never Used   Comment: smoked a pipe for 6-7 y ears  . Alcohol Use: No  . Drug Use: No  . Sexually Active: Not on file   Other Topics Concern  . Not on file   Social History Narrative  . No narrative on file    Past Surgical History  Procedure Date  . Esophagogastroduodenoscopy 2004  . Colonoscopy 2004  . Lumbar laminectomy   . Lumbar fusion   .  Knee arthroscopy     left  . Shoulder arthroscopy     right  . Knee arthroscopy     right  . Decompression of the median nerve     left wrist and hand  . Revision of tkr 2011    on left  . Coronary stent placement   . Back surgery 2005-last    lumbar x2  . Excisional total knee arthroplasty 12/25/2010    Procedure: EXCISIONAL TOTAL KNEE ARTHROPLASTY;  Surgeon: Shelda Pal;  Location: [facility];  Service: Orthopedics;  Laterality: Right;  repeat irrigation   and debridement, removal and reinsertation of spacer block  . Joint replacement 2011    left knee x 2 ; 08/2010-right knee-infected  . I&d extremity 03/25/2011    Procedure: IRRIGATION AND DEBRIDEMENT EXTREMITY;  Surgeon: Shelda Pal, MD;  Location: [facility];  Service: Orthopedics;  Laterality: Right;  . Coronary  angioplasty 1998    stents 1998   . Total knee revision 05/13/2011    Procedure: TOTAL KNEE REVISION;  Surgeon: Shelda Pal, MD;  Location: [facility];  Service: Orthopedics;  Laterality: Right;  Reimplantation    No family history on file.  Allergies  Allergen Reactions  . Acetaminophen Other (See Comments)    LIVER INFLAMMATION IN HIGH DOSES    Current Outpatient Prescriptions on File Prior to Visit  Medication Sig Dispense Refill  . aspirin 325 MG tablet Take 81 mg by mouth daily.       . baclofen (LIORESAL) 10 MG tablet Take 1 tablet (10 mg total) by mouth 3 (three) times daily.  90 each  3  . bisoprolol (ZEBETA) 5 MG tablet Take 2.5 mg by mouth daily after breakfast.       . diphenhydrAMINE (BENADRYL) 25 mg capsule Take 25 mg by mouth every 6 (six) hours as needed.      . ferrous sulfate 325 (65 FE) MG tablet Take 1 tablet (325 mg total) by mouth 3 (three) times daily after meals.      . gabapentin (NEURONTIN) 300 MG capsule Take 1 capsule (300 mg total) by mouth 3 (three) times daily.  90 capsule  3  . lisinopril (PRINIVIL,ZESTRIL) 20 MG tablet Take 10 mg by mouth daily.      Marland Kitchen morphine (MSIR) 15 MG tablet 1 tablet every 8 hours as needed for breakthrough pain no more than 3 tablets a day  90 tablet  0  . promethazine (PHENERGAN) 12.5 MG tablet Take 25 mg by mouth every 6 (six) hours as needed.      . simvastatin (ZOCOR) 40 MG tablet Take 40 mg by mouth at bedtime.      Marland Kitchen DISCONTD: zolpidem (AMBIEN) 5 MG tablet Take 1 tablet (5 mg total) by mouth at bedtime as needed for sleep.  20 tablet  0    BP 146/86  Pulse 80  Temp 98 F (36.7 C)  Resp 16  Ht 5\' 10"  (1.778 m)  Wt 182 lb (82.555 kg)  BMI 26.11 kg/m2       Objective:   Physical Exam  Nursing note and vitals reviewed. Constitutional: He appears well-developed and well-nourished.  HENT:  Head: Normocephalic and atraumatic.  Eyes: Conjunctivae are normal. Pupils are equal, round, and reactive to light.  Neck:  Normal range of motion. Neck supple.  Cardiovascular: Normal rate and regular rhythm.   Pulmonary/Chest: Effort normal and breath sounds normal.  Abdominal: Soft. Bowel sounds are normal.  Assessment & Plan:  Refer to pain clinic for chronic pain control he has been intolerant of many of the long-acting pain medications either developing constipation or having to discontinue them due to cost he states that the short acting morphine as effective as well she takes Tylenol in between dosing.  He is status post bilateral knee replacements and multiple fold he denies any trauma to the knees he states he is having some knee pain if the pain persists we should examine the knees for any loosening of the prosthesis to a fall  He states that he believes the falls are due to inattention but I cannot help but think that they are also secondary to his pain medications this is why he is being referred to pain clinic to find alternative medications for his pain management

## 2011-09-03 NOTE — Patient Instructions (Addendum)
The patient is instructed to continue all medications as prescribed. Schedule followup with check out clerk upon leaving the clinic You may use up to 4 extra strength Tylenol a day

## 2011-09-09 ENCOUNTER — Emergency Department (HOSPITAL_COMMUNITY): Payer: Medicare Other

## 2011-09-09 ENCOUNTER — Encounter (HOSPITAL_COMMUNITY): Payer: Self-pay | Admitting: Adult Health

## 2011-09-09 ENCOUNTER — Emergency Department (HOSPITAL_COMMUNITY)
Admission: EM | Admit: 2011-09-09 | Discharge: 2011-09-10 | Disposition: A | Discharge status: Home / Self Care | Payer: Medicare Other | Attending: Emergency Medicine | Admitting: Emergency Medicine

## 2011-09-09 DIAGNOSIS — R0789 Other chest pain: Secondary | ICD-10-CM

## 2011-09-09 DIAGNOSIS — R079 Chest pain, unspecified: Secondary | ICD-10-CM | POA: Insufficient documentation

## 2011-09-09 DIAGNOSIS — I252 Old myocardial infarction: Secondary | ICD-10-CM | POA: Insufficient documentation

## 2011-09-09 DIAGNOSIS — I10 Essential (primary) hypertension: Secondary | ICD-10-CM | POA: Insufficient documentation

## 2011-09-09 DIAGNOSIS — I2510 Atherosclerotic heart disease of native coronary artery without angina pectoris: Secondary | ICD-10-CM | POA: Insufficient documentation

## 2011-09-09 DIAGNOSIS — E785 Hyperlipidemia, unspecified: Secondary | ICD-10-CM | POA: Insufficient documentation

## 2011-09-09 DIAGNOSIS — K30 Functional dyspepsia: Secondary | ICD-10-CM

## 2011-09-09 DIAGNOSIS — Z8711 Personal history of peptic ulcer disease: Secondary | ICD-10-CM | POA: Insufficient documentation

## 2011-09-09 DIAGNOSIS — Z79899 Other long term (current) drug therapy: Secondary | ICD-10-CM | POA: Insufficient documentation

## 2011-09-09 LAB — COMPREHENSIVE METABOLIC PANEL
ALT: 17 U/L (ref 0–53)
AST: 23 U/L (ref 0–37)
Albumin: 3.6 g/dL (ref 3.5–5.2)
Alkaline Phosphatase: 97 U/L (ref 39–117)
BUN: 21 mg/dL (ref 6–23)
CO2: 27 mEq/L (ref 19–32)
Calcium: 10 mg/dL (ref 8.4–10.5)
Chloride: 103 mEq/L (ref 96–112)
Creatinine, Ser: 1.03 mg/dL (ref 0.50–1.35)
GFR calc Af Amer: 82 mL/min — ABNORMAL LOW (ref >90)
GFR calc non Af Amer: 71 mL/min — ABNORMAL LOW (ref >90)
Glucose, Bld: 105 mg/dL — ABNORMAL HIGH (ref 70–99)
Potassium: 4.6 mEq/L (ref 3.5–5.1)
Sodium: 140 mEq/L (ref 135–145)
Total Bilirubin: 0.3 mg/dL (ref 0.3–1.2)
Total Protein: 7.3 g/dL (ref 6.0–8.3)

## 2011-09-09 LAB — CBC
HCT: 39.2 % (ref 39.0–52.0)
Hemoglobin: 12.9 g/dL — ABNORMAL LOW (ref 13.0–17.0)
MCH: 29.6 pg (ref 26.0–34.0)
MCHC: 32.9 g/dL (ref 30.0–36.0)
MCV: 89.9 fL (ref 78.0–100.0)
Platelets: 173 10*3/uL (ref 150–400)
RBC: 4.36 MIL/uL (ref 4.22–5.81)
RDW: 16.1 % — ABNORMAL HIGH (ref 11.5–15.5)
WBC: 7.2 10*3/uL (ref 4.0–10.5)

## 2011-09-09 LAB — POCT I-STAT TROPONIN I
Troponin i, poc: 0.02 ng/mL (ref 0.00–0.08)
Troponin i, poc: 0.02 ng/mL (ref 0.00–0.08)

## 2011-09-09 MED ORDER — MORPHINE SULFATE 4 MG/ML IJ SOLN
4.0000 mg | Freq: Once | INTRAMUSCULAR | Status: AC
  Administered 2011-09-09: 4 mg via INTRAVENOUS
  Filled 2011-09-09: qty 1

## 2011-09-09 MED ORDER — ONDANSETRON HCL 4 MG/2ML IJ SOLN
INTRAMUSCULAR | Status: AC
  Administered 2011-09-09: 21:00:00
  Filled 2011-09-09: qty 2

## 2011-09-09 NOTE — ED Notes (Signed)
Pt denies any chest pain at this time only c/o pain in lower back which is chronic.  Morphine given for same.  Family remains at bedside.

## 2011-09-09 NOTE — ED Notes (Signed)
C/o midsternal chest pain that began at 1730 this afternoon that radiates to right side and epigastric, rated 1-2/10 after nitro, began as a 5/10 before nitro. Hx of afib. Given 324 of ASA and 1 nitro. Chest pain associated with nausea, given 4 mg of zofran. Pt is alert and oriented, maex4. Pain is described as stabbing, burping makes it better.

## 2011-09-09 NOTE — ED Provider Notes (Signed)
History     CSN: [id number]  Arrival date & time 09/09/11  2030   First MD Initiated Contact with Patient 09/09/11 2124      No chief complaint on file.   (Consider location/radiation/quality/duration/timing/severity/associated sxs/prior treatment) Patient is a 71 y.o. male presenting with chest pain. The history is provided by the patient.  Chest Pain The chest pain began 3 - 5 hours ago. Duration of episode(s) is 45 minutes. Chest pain occurs rarely. The chest pain is resolved. The pain is associated with eating. At its most intense, the pain is at 5/10. The pain is currently at 0/10. The severity of the pain is mild. Quality: fullness, and sensation like he has to burp. The pain does not radiate. Chest pain is worsened by eating. Pertinent negatives for primary symptoms include no fever, no fatigue, no shortness of breath, no cough, no wheezing, no palpitations, no abdominal pain, no nausea and no vomiting.  Pertinent negatives for associated symptoms include no numbness and no weakness. He tried nothing for the symptoms. Risk factors include male gender and lack of exercise.  His past medical history is significant for anxiety/panic attacks, CAD, hyperlipidemia and hypertension.  His family medical history is significant for CAD in family.  Procedure history is positive for cardiac catheterization and echocardiogram.     Past Medical History  Diagnosis Date  . Hypertension   . Hyperlipidemia   . Preoperative cardiovascular examination   . Spinal stenosis, lumbar   . CAS (cerebral atherosclerosis)   . Erectile dysfunction   . Preventative health care   . Benign prostatic hypertrophy with urinary obstruction   . Family history of early CAD     male 1st degree relative <50  . Peptic ulcer disease   . Low back pain   . Superficial spreading melanoma   . Sleep apnea, obstructive     CPAP-does not use  . HA (headache)     sinus headaches  . Arthritis     knees  . Myocardial  infarction 1997    Hx MI 1997 and 1998  . Anxiety   . Pneumonia 2005  . Anemia   . Depression     takes Zoloft  . Neuropathy, autonomic, idiopathic peripheral, other   . Blood transfusion 1 yr. ago    jerking,fever-after blood transfusion  . Blood transfusion     given wrong blood  . Coronary artery disease 1998    cardiac stent/ Clearance Dr Lovell Sheehan and Jacinto Halim with note on chart  . Dysrhythmia     hx of atrial fib per ov note of 12/12   . Bradycardia     hx of bradycardia in past per ov note of 12/12    Past Surgical History  Procedure Date  . Esophagogastroduodenoscopy 2004  . Colonoscopy 2004  . Lumbar laminectomy   . Lumbar fusion   . Knee arthroscopy     left  . Shoulder arthroscopy     right  . Knee arthroscopy     right  . Decompression of the median nerve     left wrist and hand  . Revision of tkr 2011    on left  . Coronary stent placement   . Back surgery 2005-last    lumbar x2  . Excisional total knee arthroplasty 12/25/2010    Procedure: EXCISIONAL TOTAL KNEE ARTHROPLASTY;  Surgeon: Shelda Pal;  Location: [facility];  Service: Orthopedics;  Laterality: Right;  repeat irrigation   and debridement, removal and reinsertation  of spacer block  . Joint replacement 2011    left knee x 2 ; 08/2010-right knee-infected  . I&d extremity 03/25/2011    Procedure: IRRIGATION AND DEBRIDEMENT EXTREMITY;  Surgeon: Shelda Pal, MD;  Location: [facility];  Service: Orthopedics;  Laterality: Right;  . Coronary angioplasty 1998    stents 1998   . Total knee revision 05/13/2011    Procedure: TOTAL KNEE REVISION;  Surgeon: Shelda Pal, MD;  Location: [facility];  Service: Orthopedics;  Laterality: Right;  Reimplantation    History reviewed. No pertinent family history.  History  Substance Use Topics  . Smoking status: Former Smoker -- 1.0 packs/day for 15 years    Types: Cigars    Quit date: 12/23/2009  . Smokeless tobacco: Never Used   Comment: smoked a pipe for 6-7 y ears    . Alcohol Use: No      Review of Systems  Constitutional: Negative for fever, activity change, appetite change and fatigue.  HENT: Negative for congestion, sore throat, facial swelling, rhinorrhea, trouble swallowing, neck pain, neck stiffness, voice change and sinus pressure.   Eyes: Negative.   Respiratory: Negative for cough, choking, chest tightness, shortness of breath and wheezing.   Cardiovascular: Positive for chest pain. Negative for palpitations and leg swelling.  Gastrointestinal: Negative for nausea, vomiting and abdominal pain.  Genitourinary: Negative for dysuria, urgency, frequency, hematuria, flank pain and difficulty urinating.  Musculoskeletal: Negative for back pain and gait problem.  Skin: Negative for rash and wound.  Neurological: Negative for facial asymmetry, weakness, numbness and headaches.  Psychiatric/Behavioral: Negative for behavioral problems, confusion and agitation. The patient is not nervous/anxious and is not hyperactive.   All other systems reviewed and are negative.    Allergies  Acetaminophen  Home Medications   Current Outpatient Rx  Name Route Sig Dispense Refill  . ASPIRIN EC 81 MG PO TBEC Oral Take 81 mg by mouth daily.    Marland Kitchen AVINZA 90 MG PO CP24 Oral Take 1 capsule by mouth daily.    Marland Kitchen BISOPROLOL FUMARATE 5 MG PO TABS Oral Take 2.5 mg by mouth daily after breakfast.     . ALKA-SELTZER PLUS COLD PO Oral Take 1 tablet by mouth daily as needed. For indigestion    . GABAPENTIN 400 MG PO CAPS Oral Take 400 mg by mouth 3 (three) times daily.    Marland Kitchen LISINOPRIL 20 MG PO TABS Oral Take 10 mg by mouth daily.    . MELOXICAM 7.5 MG PO TABS Oral Take 1 tablet by mouth Twice daily.    . MORPHINE SULFATE 15 MG PO TABS Oral Take 15 mg by mouth every 8 (eight) hours as needed. For breakthrough pain. For pain    . SIMVASTATIN 40 MG PO TABS Oral Take 40 mg by mouth at bedtime.      BP 164/84  Pulse 42  Temp 98.3 F (36.8 C) (Oral)  Resp 15  SpO2  100%  Physical Exam  Nursing note and vitals reviewed. Constitutional: He is oriented to person, place, and time. He appears well-developed and well-nourished. No distress.  HENT:  Head: Normocephalic and atraumatic.  Right Ear: External ear normal.  Left Ear: External ear normal.  Mouth/Throat: No oropharyngeal exudate.  Eyes: Conjunctivae and EOM are normal. Pupils are equal, round, and reactive to light. Right eye exhibits no discharge. Left eye exhibits no discharge.  Neck: Normal range of motion. Neck supple. No JVD present. No tracheal deviation present. No thyromegaly present.  Cardiovascular:  Normal rate, regular rhythm, normal heart sounds and intact distal pulses.  Exam reveals no gallop and no friction rub.   No murmur heard. Pulmonary/Chest: Effort normal and breath sounds normal. No respiratory distress. He has no wheezes. He exhibits no tenderness.  Abdominal: Soft. Bowel sounds are normal. He exhibits no distension. There is no tenderness. There is no rebound and no guarding.  Musculoskeletal: Normal range of motion. He exhibits no edema and no tenderness.  Lymphadenopathy:    He has no cervical adenopathy.  Neurological: He is alert and oriented to person, place, and time. No cranial nerve deficit.  Skin: Skin is warm and dry. No rash noted. He is not diaphoretic. No pallor.  Psychiatric: He has a normal mood and affect. His behavior is normal.    ED Course  Procedures (including critical care time)  Labs Reviewed  CBC - Abnormal; Notable for the following:    Hemoglobin 12.9 (*)     RDW 16.1 (*)     All other components within normal limits  COMPREHENSIVE METABOLIC PANEL - Abnormal; Notable for the following:    Glucose, Bld 105 (*)     GFR calc non Af Amer 71 (*)     GFR calc Af Amer 82 (*)     All other components within normal limits  POCT I-STAT TROPONIN I  POCT I-STAT TROPONIN I   Dg Chest Portable 1 View  09/09/2011  *RADIOLOGY REPORT*  Clinical Data:  Chest pain  PORTABLE CHEST - 1 VIEW  Comparison: 05/17/2011  Findings: Cardiac enlargement without heart failure.  Negative for pneumonia or effusion.  IMPRESSION: No acute cardiopulmonary disease.  Original Report Authenticated By: Camelia Phenes, M.D.     No diagnosis found.    MDM  71 the 55-year-old male patient with past medical history of hypertension hyperlipidemia coronary artery disease status post angioplasty done in 1998 presents with lower chest discomfort. He should says he was feeling his normal baseline health today when he ate lasagna which typically gets an indigestion. He says after eating lasagna he started feeling what he calls indigestion, which she describes as abdominal lower chest fullness and a sensation like he has to burp. The sensation was rated 5/10 did not radiate was worse with further eating nothing relieves it. Patient without exertional dyspnea shortness of breath nausea vomiting fevers diarrhea or recent illness. Patient called him as was given 324th aspirin along with one nitroglycerin. At that time patient started having resolution of symptoms but he thinks it was related to the passage of time as opposed to medications. Patient now asymptomatic with a normal exam as above. Pain is atypical for coronary artery disease or ACS and I doubt this presents ACS but will evaluate with EKG troponin at 6 hours and basic labs.  Results for orders placed during the hospital encounter of 09/09/11  CBC      Component Value Range   WBC 7.2  4.0 - 10.5 K/uL   RBC 4.36  4.22 - 5.81 MIL/uL   Hemoglobin 12.9 (*) 13.0 - 17.0 g/dL   HCT 16.1  09.6 - 04.5 %   MCV 89.9  78.0 - 100.0 fL   MCH 29.6  26.0 - 34.0 pg   MCHC 32.9  30.0 - 36.0 g/dL   RDW 40.9 (*) 81.1 - 91.4 %   Platelets 173  150 - 400 K/uL  COMPREHENSIVE METABOLIC PANEL      Component Value Range   Sodium 140  135 -  145 mEq/L   Potassium 4.6  3.5 - 5.1 mEq/L   Chloride 103  96 - 112 mEq/L   CO2 27  19 - 32 mEq/L    Glucose, Bld 105 (*) 70 - 99 mg/dL   BUN 21  6 - 23 mg/dL   Creatinine, Ser 1.61  0.50 - 1.35 mg/dL   Calcium 09.6  8.4 - 04.5 mg/dL   Total Protein 7.3  6.0 - 8.3 g/dL   Albumin 3.6  3.5 - 5.2 g/dL   AST 23  0 - 37 U/L   ALT 17  0 - 53 U/L   Alkaline Phosphatase 97  39 - 117 U/L   Total Bilirubin 0.3  0.3 - 1.2 mg/dL   GFR calc non Af Amer 71 (*) >90 mL/min   GFR calc Af Amer 82 (*) >90 mL/min  POCT I-STAT TROPONIN I      Component Value Range   Troponin i, poc 0.02  0.00 - 0.08 ng/mL   Comment 3           POCT I-STAT TROPONIN I      Component Value Range   Troponin i, poc 0.02  0.00 - 0.08 ng/mL   Comment 3                DG Chest Portable 1 View (Final result)   Result time:09/09/11 2103    Final result by Rad Results In Interface (09/09/11 21:03:06)    Narrative:   *RADIOLOGY REPORT*  Clinical Data: Chest pain  PORTABLE CHEST - 1 VIEW  Comparison: 05/17/2011  Findings: Cardiac enlargement without heart failure. Negative for pneumonia or effusion.  IMPRESSION: No acute cardiopulmonary disease.  Original Report Authenticated By: Camelia Phenes, M.D.     No evidence of myocarditis pericarditis pneumothorax pneumonia or other signs of emergent chest pain. We'll trend troponins and have patient followup with cardiologist with this atypical now resolved discomfort it seems more likely related to indigestion given the description and physical exam.  Second troponin gathered 6 hours after initial symptom onset remains negative, doubt ACS. We'll have patient followup for possible stress testing with his cardiologist.  Case discussed with Dr. Sharlot Gowda, MD 09/09/11 2350

## 2011-09-10 ENCOUNTER — Encounter (HOSPITAL_COMMUNITY): Payer: Self-pay | Admitting: Emergency Medicine

## 2011-09-13 ENCOUNTER — Telehealth: Payer: Self-pay | Admitting: Internal Medicine

## 2011-09-13 NOTE — Telephone Encounter (Signed)
Dr jenkins is aware 

## 2011-09-13 NOTE — ED Provider Notes (Signed)
I saw and evaluated the patient, reviewed the resident's note and I agree with the findings and plan. I personally reviewed the pt's EKG.  70yM with CP atypical for ACS. Trop x2 WNL with second set drawn over 6 hours of onset of symptoms. Feel safe for DC at this time. FU with his cardiologist.  Raeford Razor, MD 09/13/11 [phone]

## 2011-09-13 NOTE — Telephone Encounter (Signed)
Pt called and said that he went to ED on 09/09/11 re:  High bp. Pt wanted to pcp know that pt is going to dermatologist on 09/19/11 re: pre-cancerous moles.

## 2011-09-16 ENCOUNTER — Telehealth: Payer: Self-pay | Admitting: Internal Medicine

## 2011-09-16 MED ORDER — MORPHINE SULFATE 15 MG PO TABS: 15.0000 mg | ORAL_TABLET | Freq: Three times a day (TID) | ORAL | Status: DC | PRN

## 2011-09-16 MED ORDER — MORPHINE SULFATE ER BEADS 90 MG PO CP24: 90.0000 mg | ORAL_CAPSULE | ORAL | Status: DC

## 2011-09-16 NOTE — Telephone Encounter (Signed)
Talked with terri and referrals was started on 7-16 and usually takes about 2-3 weeks to hear from office for ov. avinza and morphine script printed and will call pt when signed and ready for pick up

## 2011-09-16 NOTE — Telephone Encounter (Signed)
Pt called and said that he has heard from a Pain Management Doctor yet, as discussed with Dr Lovell Sheehan. Pt also req to get scripts for AVINZA 90 MG 24 hr capsule and  morphine (MSIR) 15 MG tablet. Pt said that both meds help back pain.

## 2011-09-17 ENCOUNTER — Telehealth: Payer: Self-pay | Admitting: Internal Medicine

## 2011-09-17 MED ORDER — MORPHINE SULFATE ER 30 MG PO TBCR: 30.0000 mg | EXTENDED_RELEASE_TABLET | Freq: Two times a day (BID) | ORAL | Status: DC

## 2011-09-17 NOTE — Telephone Encounter (Signed)
Per dr Lovell Sheehan- give ms contin 30 bid- pt informed it is ready for pick up

## 2011-09-17 NOTE — Telephone Encounter (Signed)
Pt went to pick up morphine (AVINZA) 90 MG 24 hr capsule from drug store and it will cost him over $200.00. Pt requesting a less expensive drug to be called in    [facility]

## 2011-09-23 ENCOUNTER — Ambulatory Visit (INDEPENDENT_AMBULATORY_CARE_PROVIDER_SITE_OTHER): Payer: Medicare Other | Admitting: Internal Medicine

## 2011-09-23 ENCOUNTER — Encounter: Payer: Self-pay | Admitting: Internal Medicine

## 2011-09-23 ENCOUNTER — Telehealth: Payer: Self-pay | Admitting: Internal Medicine

## 2011-09-23 VITALS — BP 130/74 | HR 69 | Temp 98.1°F | Ht 70.0 in | Wt 184.0 lb

## 2011-09-23 DIAGNOSIS — R109 Unspecified abdominal pain: Secondary | ICD-10-CM

## 2011-09-23 LAB — POCT URINALYSIS DIPSTICK
Ketones, UA: NEGATIVE
Leukocytes, UA: NEGATIVE
Nitrite, UA: NEGATIVE

## 2011-09-23 NOTE — Assessment & Plan Note (Signed)
71 year old white male with intermittent left flank/back pain. Despite some dysuria symptoms his urinalysis is completely normal. He is not having any fevers or chills. I doubt symptoms are from nephrolithiasis or pyelonephritis. I suspect low back pain stemming from chronic low back pain issues. Patient advised increase fluids.  Patient will followup with primary care physician within 2 weeks.  Patient advised to call office if symptoms persist or worsen.

## 2011-09-23 NOTE — Patient Instructions (Addendum)
Follow up with Dr. Lovell Sheehan within 2 weeks Increase your fluid (water) intake. Only take Mobic (Meloxicam) as needed Please call our office if your left back symptoms do not improve or gets worse.

## 2011-09-23 NOTE — Telephone Encounter (Signed)
P [id number] CAN POOL

## 2011-09-23 NOTE — Telephone Encounter (Signed)
Caller: Arun/Patient; PCP: Darryll Capers; CB#: [phone];  Call regarding Back Pain; Onset estimated 3 months ago (May 2013).  On Morphine for same. Center of lower back and left leg pain.  Currently rates pain at 7-8 of 10 and relates pain is over the kidney area, burning with urination and frequency.  Emergent symptoms ruled out.  Home care for the interim and parameters for callback.  Appointment at 15:15 with Dr. Artist Pais.

## 2011-09-23 NOTE — Progress Notes (Signed)
Subjective:    Patient ID: [name], male    DOB: 07/08/1940, 71 y.o.   MRN: [id number]  HPI  71 year old white male with history of hypertension, coronary artery disease and chronic low back pain complains of left low back/flank pain over last several days. His symptoms are intermittent. He is concerned that he may have a kidney infection. He has had intermittent mild dysuria with urination. He describes a burning, stabbing pain. He denies fever or chills.  Pain is mild to moderate.  He denies history of kidney stones.   Review of Systems Negative for fever or chills  Past Medical History  Diagnosis Date  . Hypertension   . Hyperlipidemia   . Preoperative cardiovascular examination   . Spinal stenosis, lumbar   . CAS (cerebral atherosclerosis)   . Erectile dysfunction   . Preventative health care   . Benign prostatic hypertrophy with urinary obstruction   . Family history of early CAD     male 1st degree relative <50  . Peptic ulcer disease   . Low back pain   . Superficial spreading melanoma   . Sleep apnea, obstructive     CPAP-does not use  . HA (headache)     sinus headaches  . Arthritis     knees  . Myocardial infarction 1997    Hx MI 1997 and 1998  . Anxiety   . Pneumonia 2005  . Anemia   . Depression     takes Zoloft  . Neuropathy, autonomic, idiopathic peripheral, other   . Blood transfusion 1 yr. ago    jerking,fever-after blood transfusion  . Blood transfusion     given wrong blood  . Coronary artery disease 1998    cardiac stent/ Clearance Dr Lovell Sheehan and Jacinto Halim with note on chart  . Dysrhythmia     hx of atrial fib per ov note of 12/12   . Bradycardia     hx of bradycardia in past per ov note of 12/12    History   Social History  . Marital Status: Married    Spouse Name: N/A    Number of Children: N/A  . Years of Education: N/A   Occupational History  . diesel repair     part time  . worked at [employer] History Main Topics    . Smoking status: Former Smoker -- 1.0 packs/day for 15 years    Types: Cigars    Quit date: 12/23/2009  . Smokeless tobacco: Never Used   Comment: smoked a pipe for 6-7 y ears  . Alcohol Use: No  . Drug Use: No  . Sexually Active: Not on file   Other Topics Concern  . Not on file   Social History Narrative  . No narrative on file    Past Surgical History  Procedure Date  . Esophagogastroduodenoscopy 2004  . Colonoscopy 2004  . Lumbar laminectomy   . Lumbar fusion   . Knee arthroscopy     left  . Shoulder arthroscopy     right  . Knee arthroscopy     right  . Decompression of the median nerve     left wrist and hand  . Revision of tkr 2011    on left  . Coronary stent placement   . Back surgery 2005-last    lumbar x2  . Excisional total knee arthroplasty 12/25/2010    Procedure: EXCISIONAL TOTAL KNEE ARTHROPLASTY;  Surgeon: Shelda Pal;  Location: [facility];  Service: Orthopedics;  Laterality: Right;  repeat irrigation   and debridement, removal and reinsertation of spacer block  . Joint replacement 2011    left knee x 2 ; 08/2010-right knee-infected  . I&d extremity 03/25/2011    Procedure: IRRIGATION AND DEBRIDEMENT EXTREMITY;  Surgeon: Shelda Pal, MD;  Location: [facility];  Service: Orthopedics;  Laterality: Right;  . Coronary angioplasty 1998    stents 1998   . Total knee revision 05/13/2011    Procedure: TOTAL KNEE REVISION;  Surgeon: Shelda Pal, MD;  Location: [facility];  Service: Orthopedics;  Laterality: Right;  Reimplantation    No family history on file.  Allergies  Allergen Reactions  . Acetaminophen Other (See Comments)    LIVER INFLAMMATION IN HIGH DOSES    Current Outpatient Prescriptions on File Prior to Visit  Medication Sig Dispense Refill  . aspirin EC 81 MG tablet Take 81 mg by mouth daily.      . bisoprolol (ZEBETA) 5 MG tablet Take 2.5 mg by mouth daily after breakfast.       . gabapentin (NEURONTIN) 400 MG capsule Take 400 mg by mouth 3  (three) times daily.      Marland Kitchen lisinopril (PRINIVIL,ZESTRIL) 20 MG tablet Take 10 mg by mouth daily.      . meloxicam (MOBIC) 7.5 MG tablet Take 1 tablet by mouth Twice daily.      Marland Kitchen morphine (MS CONTIN) 30 MG 12 hr tablet Take 1 tablet (30 mg total) by mouth 2 (two) times daily.  60 tablet  0  . morphine (MSIR) 15 MG tablet Take 1 tablet (15 mg total) by mouth every 8 (eight) hours as needed. For breakthrough pain. For pain  30 tablet  0  . simvastatin (ZOCOR) 40 MG tablet Take 40 mg by mouth at bedtime.      . Chlorphen-Phenyleph-ASA (ALKA-SELTZER PLUS COLD PO) Take 1 tablet by mouth daily as needed. For indigestion      . DISCONTD: zolpidem (AMBIEN) 5 MG tablet Take 1 tablet (5 mg total) by mouth at bedtime as needed for sleep.  20 tablet  0    BP 130/74  Pulse 69  Temp 98.1 F (36.7 C) (Oral)  Ht 5\' 10"  (1.778 m)  Wt 184 lb (83.462 kg)  BMI 26.40 kg/m2  SpO2 98%  Urinalysis notable for bilirubin 1+ and protein 1+     Objective:   Physical Exam  Constitutional: He is oriented to person, place, and time. He appears well-developed and well-nourished. No distress.  HENT:  Head: Normocephalic and atraumatic.  Cardiovascular: Normal rate, regular rhythm and normal heart sounds.   Pulmonary/Chest: Effort normal and breath sounds normal. He has no wheezes.  Abdominal: Soft. Bowel sounds are normal. He exhibits no mass. There is no tenderness.  Musculoskeletal: He exhibits no edema.  Neurological: He is alert and oriented to person, place, and time.  Skin: Skin is warm and dry.  Psychiatric: He has a normal mood and affect. His behavior is normal.       Assessment & Plan:

## 2011-10-02 ENCOUNTER — Telehealth: Payer: Self-pay | Admitting: Internal Medicine

## 2011-10-02 MED ORDER — MORPHINE SULFATE 15 MG PO TABS: 15.0000 mg | ORAL_TABLET | Freq: Three times a day (TID) | ORAL | Status: DC | PRN

## 2011-10-02 NOTE — Telephone Encounter (Signed)
Printed script and will call pt when signed by dr Lovell Sheehan

## 2011-10-02 NOTE — Telephone Encounter (Signed)
Pt requesting refill on morphine (MSIR) 15 MG tablet

## 2011-10-03 ENCOUNTER — Telehealth: Payer: Self-pay | Admitting: Internal Medicine

## 2011-10-03 DIAGNOSIS — M549 Dorsalgia, unspecified: Secondary | ICD-10-CM

## 2011-10-03 DIAGNOSIS — G8929 Other chronic pain: Secondary | ICD-10-CM

## 2011-10-03 NOTE — Telephone Encounter (Signed)
Patient came in today to pick up a prescription, he stated that he would like a referral to [facility] [phone]) [address] to regain strength back in his legs. Thank you.

## 2011-10-04 ENCOUNTER — Ambulatory Visit: Payer: Medicare Other | Admitting: Internal Medicine

## 2011-10-11 ENCOUNTER — Telehealth: Payer: Self-pay | Admitting: Internal Medicine

## 2011-10-11 NOTE — Telephone Encounter (Signed)
Patient called stating that he would like to have something called into [facility] for a sinus infection. Please advise/assist.

## 2011-10-11 NOTE — Telephone Encounter (Signed)
Per dr Lovell Sheehan- try mucinex cough and cold- pt's wife informed

## 2011-10-18 ENCOUNTER — Ambulatory Visit (INDEPENDENT_AMBULATORY_CARE_PROVIDER_SITE_OTHER): Payer: Medicare Other | Admitting: Family Medicine

## 2011-10-18 ENCOUNTER — Encounter: Payer: Self-pay | Admitting: Family Medicine

## 2011-10-18 ENCOUNTER — Telehealth: Payer: Self-pay | Admitting: Internal Medicine

## 2011-10-18 VITALS — BP 130/80 | HR 56 | Temp 97.8°F | Wt 189.0 lb

## 2011-10-18 DIAGNOSIS — R42 Dizziness and giddiness: Secondary | ICD-10-CM

## 2011-10-18 DIAGNOSIS — I10 Essential (primary) hypertension: Secondary | ICD-10-CM

## 2011-10-18 NOTE — Patient Instructions (Addendum)
Drink more fluids Gradually reduce caffeine use

## 2011-10-18 NOTE — Progress Notes (Signed)
Subjective:    Patient ID: [name], male    DOB: 09/02/1940, 71 y.o.   MRN: [id number]  HPI  Acute visit. Patient seen with several week history of intermittent dizziness. Described as lightheadedness. Symptoms are brought on by change of positions such as sitting to standing. No history of syncope. No chest pain. Symptoms usually improve after several minutes of standing. Denies vertigo. Recently went to the hospital and had lab work on 09/09/2011 which was unremarkable. Hemoglobin 12.9.  Patient has multiple chronic problems including hyperlipidemia, obstructive sleep apnea, peripheral neuropathy, hypertension, CAD, spinal stenosis with chronic low back pain. He takes blood pressure medications including lisinopril and bisoprolol. Also takes gabapentin and both extended release and immediate release morphine. He has recently tried scaling back his morphine. Admits he's not drinking a lot of fluids. No recent nausea, vomiting, or diarrhea. No abdominal pain. No chest pain.  Past Medical History  Diagnosis Date  . Hypertension   . Hyperlipidemia   . Preoperative cardiovascular examination   . Spinal stenosis, lumbar   . CAS (cerebral atherosclerosis)   . Erectile dysfunction   . Preventative health care   . Benign prostatic hypertrophy with urinary obstruction   . Family history of early CAD     male 1st degree relative <50  . Peptic ulcer disease   . Low back pain   . Superficial spreading melanoma   . Sleep apnea, obstructive     CPAP-does not use  . HA (headache)     sinus headaches  . Arthritis     knees  . Myocardial infarction 1997    Hx MI 1997 and 1998  . Anxiety   . Pneumonia 2005  . Anemia   . Depression     takes Zoloft  . Neuropathy, autonomic, idiopathic peripheral, other   . Blood transfusion 1 yr. ago    jerking,fever-after blood transfusion  . Blood transfusion     given wrong blood  . Coronary artery disease 1998    cardiac stent/ Clearance Dr  Lovell Sheehan and Jacinto Halim with note on chart  . Dysrhythmia     hx of atrial fib per ov note of 12/12   . Bradycardia     hx of bradycardia in past per ov note of 12/12   Past Surgical History  Procedure Date  . Esophagogastroduodenoscopy 2004  . Colonoscopy 2004  . Lumbar laminectomy   . Lumbar fusion   . Knee arthroscopy     left  . Shoulder arthroscopy     right  . Knee arthroscopy     right  . Decompression of the median nerve     left wrist and hand  . Revision of tkr 2011    on left  . Coronary stent placement   . Back surgery 2005-last    lumbar x2  . Excisional total knee arthroplasty 12/25/2010    Procedure: EXCISIONAL TOTAL KNEE ARTHROPLASTY;  Surgeon: Shelda Pal;  Location: [facility];  Service: Orthopedics;  Laterality: Right;  repeat irrigation   and debridement, removal and reinsertation of spacer block  . Joint replacement 2011    left knee x 2 ; 08/2010-right knee-infected  . I&d extremity 03/25/2011    Procedure: IRRIGATION AND DEBRIDEMENT EXTREMITY;  Surgeon: Shelda Pal, MD;  Location: [facility];  Service: Orthopedics;  Laterality: Right;  . Coronary angioplasty 1998    stents 1998   . Total knee revision 05/13/2011    Procedure: TOTAL KNEE REVISION;  Surgeon:  Shelda Pal, MD;  Location: [facility];  Service: Orthopedics;  Laterality: Right;  Reimplantation    reports that he quit smoking about 21 months ago. His smoking use included Cigars. He has never used smokeless tobacco. He reports that he does not drink alcohol or use illicit drugs. family history is not on file. Allergies  Allergen Reactions  . Acetaminophen Other (See Comments)    LIVER INFLAMMATION IN HIGH DOSES      Review of Systems  Constitutional: Positive for fatigue. Negative for chills and unexpected weight change.  Respiratory: Negative for cough.   Cardiovascular: Negative for chest pain.  Gastrointestinal: Negative for nausea, vomiting, abdominal pain and blood in stool.  Genitourinary:  Negative for dysuria and hematuria.  Neurological: Positive for dizziness and light-headedness. Negative for tremors, seizures, syncope, speech difficulty and headaches.  Hematological: Negative for adenopathy.  Psychiatric/Behavioral: Negative for confusion.       Objective:   Physical Exam  Constitutional: He is oriented to person, place, and time. He appears well-developed and well-nourished.  HENT:  Mouth/Throat: Oropharynx is clear and moist.  Eyes: Pupils are equal, round, and reactive to light.  Neck: Neck supple.  Cardiovascular: Normal rate and regular rhythm.   Pulmonary/Chest: Effort normal and breath sounds normal. No respiratory distress. He has no wheezes. He has no rales.  Neurological: He is alert and oriented to person, place, and time. No cranial nerve deficit.       No focal strength deficits Normal finger to nose testing.  Psychiatric: He has a normal mood and affect. His behavior is normal.          Assessment & Plan:  Dizziness. Suspect multifactorial. Blood pressure 142/80 sitting to 124/76 standing. Patient takes beta blocker, other antihypertensives, and chronic opioids. Also apparently does not drink a lot of fluids. Increase fluid intake. He is trying to scale back his pain medication but still having significant pain. Change positions slowly. Consider cane use to reduce risk of falls

## 2011-10-18 NOTE — Telephone Encounter (Signed)
Caller: Khameron/Patient; Patient Name: [name]; PCP: Darryll Capers (Adults only); Best Callback Phone Number: [phone].  Call regarding Dizziness and Nausea, onset 8-30.  BP 108/62 P 56.  Patient has constant dizziness. All emergent symptoms ruled out per Dizziness Protocol, see in 24 hours due to dizziness interferring with daily activities and low BP for Patient's baseline BP.  No appointments with Dr Lovell Sheehan and PA Campebll not in the office.  Patient verbalized understanding.

## 2011-10-30 ENCOUNTER — Telehealth: Payer: Self-pay | Admitting: Internal Medicine

## 2011-10-30 MED ORDER — MORPHINE SULFATE ER 30 MG PO TBCR: 30.0000 mg | EXTENDED_RELEASE_TABLET | Freq: Two times a day (BID) | ORAL | Status: DC

## 2011-10-30 NOTE — Telephone Encounter (Signed)
Patient called stating that he need a refill of his morphine. Please assist.

## 2011-10-30 NOTE — Telephone Encounter (Signed)
Printed 3 scripts-will call pt when signed and ready for pick up

## 2011-11-07 ENCOUNTER — Ambulatory Visit (INDEPENDENT_AMBULATORY_CARE_PROVIDER_SITE_OTHER): Payer: Medicare Other | Admitting: Internal Medicine

## 2011-11-07 ENCOUNTER — Ambulatory Visit: Payer: Medicare Other | Admitting: Internal Medicine

## 2011-11-07 ENCOUNTER — Encounter: Payer: Self-pay | Admitting: Internal Medicine

## 2011-11-07 VITALS — BP 160/70 | HR 72 | Temp 98.2°F | Resp 16 | Ht 70.0 in | Wt 190.0 lb

## 2011-11-07 DIAGNOSIS — M48061 Spinal stenosis, lumbar region without neurogenic claudication: Secondary | ICD-10-CM

## 2011-11-07 DIAGNOSIS — G9009 Other idiopathic peripheral autonomic neuropathy: Secondary | ICD-10-CM

## 2011-11-07 DIAGNOSIS — I10 Essential (primary) hypertension: Secondary | ICD-10-CM

## 2011-11-07 DIAGNOSIS — E785 Hyperlipidemia, unspecified: Secondary | ICD-10-CM

## 2011-11-07 DIAGNOSIS — G4733 Obstructive sleep apnea (adult) (pediatric): Secondary | ICD-10-CM

## 2011-11-07 DIAGNOSIS — Z23 Encounter for immunization: Secondary | ICD-10-CM

## 2011-11-07 DIAGNOSIS — G4730 Sleep apnea, unspecified: Secondary | ICD-10-CM

## 2011-11-07 NOTE — Patient Instructions (Addendum)
The patient is instructed to continue all medications as prescribed. Schedule followup with check out clerk upon leaving the clinic  

## 2011-11-07 NOTE — Progress Notes (Signed)
Subjective:    Patient ID: [name], male    DOB: [birth year], 71 y.o.   MRN: [id number]  HPI Is a 71 year old male who presents for followup of hypertension hyperlipidemia osteoarthritis chronic back pain due to spinal stenosis and an acute complaint of a painful area on his left lateral foot.  He apparently has a callus formation and the possible bleed into the callus causing the cyst creating irregular pain of his foot.  He seen pain specialists for injection therapy which appears to be helping.  We discussed the fluctuation in his blood pressure in the setting of pain and now will go up and down when he said he got a bad day.     Review of Systems  Constitutional: Negative for fever and fatigue.  HENT: Negative for hearing loss, congestion, neck pain and postnasal drip.   Eyes: Negative for discharge, redness and visual disturbance.  Respiratory: Negative for cough, shortness of breath and wheezing.   Cardiovascular: Negative for leg swelling.  Gastrointestinal: Negative for abdominal pain, constipation and abdominal distention.  Genitourinary: Negative for urgency and frequency.  Musculoskeletal: Positive for myalgias, back pain, joint swelling, arthralgias and gait problem.  Skin: Negative for color change and rash.  Neurological: Negative for weakness and light-headedness.  Hematological: Negative for adenopathy.  Psychiatric/Behavioral: Negative for behavioral problems.   Past Medical History  Diagnosis Date  . Hypertension   . Hyperlipidemia   . Preoperative cardiovascular examination   . Spinal stenosis, lumbar   . CAS (cerebral atherosclerosis)   . Erectile dysfunction   . Preventative health care   . Benign prostatic hypertrophy with urinary obstruction   . Family history of early CAD     male 1st degree relative <50  . Peptic ulcer disease   . Low back pain   . Superficial spreading melanoma   . Sleep apnea, obstructive     CPAP-does not use  . HA  (headache)     sinus headaches  . Arthritis     knees  . Myocardial infarction 1997    Hx MI 1997 and 1998  . Anxiety   . Pneumonia 2005  . Anemia   . Depression     takes Zoloft  . Neuropathy, autonomic, idiopathic peripheral, other   . Blood transfusion 1 yr. ago    jerking,fever-after blood transfusion  . Blood transfusion     given wrong blood  . Coronary artery disease 1998    cardiac stent/ Clearance Dr Lovell Sheehan and Jacinto Halim with note on chart  . Dysrhythmia     hx of atrial fib per ov note of 12/12   . Bradycardia     hx of bradycardia in past per ov note of 12/12    History   Social History  . Marital Status: Married    Spouse Name: N/A    Number of Children: N/A  . Years of Education: N/A   Occupational History  . diesel repair     part time  . worked at [employer] History Main Topics  . Smoking status: Former Smoker -- 1.0 packs/day for 15 years    Types: Cigars    Quit date: 12/23/2009  . Smokeless tobacco: Never Used   Comment: smoked a pipe for 6-7 y ears  . Alcohol Use: No  . Drug Use: No  . Sexually Active: Not on file   Other Topics Concern  . Not on file   Social History Narrative  . No  narrative on file    Past Surgical History  Procedure Date  . Esophagogastroduodenoscopy 2004  . Colonoscopy 2004  . Lumbar laminectomy   . Lumbar fusion   . Knee arthroscopy     left  . Shoulder arthroscopy     right  . Knee arthroscopy     right  . Decompression of the median nerve     left wrist and hand  . Revision of tkr 2011    on left  . Coronary stent placement   . Back surgery 2005-last    lumbar x2  . Excisional total knee arthroplasty 12/25/2010    Procedure: EXCISIONAL TOTAL KNEE ARTHROPLASTY;  Surgeon: Shelda Pal;  Location: [facility];  Service: Orthopedics;  Laterality: Right;  repeat irrigation   and debridement, removal and reinsertation of spacer block  . Joint replacement 2011    left knee x 2 ; 08/2010-right knee-infected    . I&d extremity 03/25/2011    Procedure: IRRIGATION AND DEBRIDEMENT EXTREMITY;  Surgeon: Shelda Pal, MD;  Location: [facility];  Service: Orthopedics;  Laterality: Right;  . Coronary angioplasty 1998    stents 1998   . Total knee revision 05/13/2011    Procedure: TOTAL KNEE REVISION;  Surgeon: Shelda Pal, MD;  Location: [facility];  Service: Orthopedics;  Laterality: Right;  Reimplantation    No family history on file.  Allergies  Allergen Reactions  . Acetaminophen Other (See Comments)    LIVER INFLAMMATION IN HIGH DOSES    Current Outpatient Prescriptions on File Prior to Visit  Medication Sig Dispense Refill  . aspirin EC 81 MG tablet Take 81 mg by mouth daily.      . bisoprolol (ZEBETA) 5 MG tablet Take 2.5 mg by mouth daily after breakfast.       . Chlorphen-Phenyleph-ASA (ALKA-SELTZER PLUS COLD PO) Take 1 tablet by mouth daily as needed. For indigestion      . gabapentin (NEURONTIN) 400 MG capsule Take 400 mg by mouth 3 (three) times daily.      Marland Kitchen lisinopril (PRINIVIL,ZESTRIL) 20 MG tablet Take 10 mg by mouth daily.      . meloxicam (MOBIC) 7.5 MG tablet Take 1 tablet by mouth Twice daily.      Marland Kitchen morphine (MS CONTIN) 30 MG 12 hr tablet Take 1 tablet (30 mg total) by mouth 2 (two) times daily.  60 tablet  0  . morphine (MSIR) 15 MG tablet Take 1 tablet (15 mg total) by mouth every 8 (eight) hours as needed. For breakthrough pain. For pain  30 tablet  0  . simvastatin (ZOCOR) 40 MG tablet Take 40 mg by mouth at bedtime.      Marland Kitchen DISCONTD: zolpidem (AMBIEN) 5 MG tablet Take 1 tablet (5 mg total) by mouth at bedtime as needed for sleep.  20 tablet  0    BP 160/70  Pulse 72  Temp 98.2 F (36.8 C)  Resp 16  Ht 5\' 10"  (1.778 m)  Wt 190 lb (86.183 kg)  BMI 27.26 kg/m2       Objective:   Physical Exam  Nursing note and vitals reviewed. Constitutional: He appears well-developed and well-nourished.  HENT:  Head: Normocephalic and atraumatic.  Eyes: Conjunctivae normal are  normal. Pupils are equal, round, and reactive to light.  Neck: Normal range of motion. Neck supple.  Cardiovascular: Normal rate and regular rhythm.   Pulmonary/Chest: Effort normal and breath sounds normal.  Abdominal: Soft. Bowel sounds are normal.  Skin:  3 cm callus with apparent hematoma under the callus          Assessment & Plan:  Stable on current medications hypertension hyperlipidemia discuss continuing management with a pain specialist for back pain.  The patient gave informed consent and the foot was prepped with Betadine using a 15-gauge blade we removed the callus and I indeed the small cyst on his foot.  Post surgical care was discussed with patient sterile bandage placed instructions were given for post care

## 2011-11-11 ENCOUNTER — Other Ambulatory Visit: Payer: Self-pay | Admitting: Internal Medicine

## 2011-11-11 MED ORDER — MORPHINE SULFATE 15 MG PO TABS: 15.0000 mg | ORAL_TABLET | Freq: Three times a day (TID) | ORAL | Status: DC | PRN

## 2011-11-11 NOTE — Telephone Encounter (Signed)
Pt needs new rx morphine 15 mg also cyst on foot is going ok

## 2011-11-11 NOTE — Telephone Encounter (Signed)
rx up front ready for p/u, pt aware 

## 2011-11-11 NOTE — Telephone Encounter (Signed)
Would you sign for this?  Dr Lovell Sheehan out of the office

## 2011-11-11 NOTE — Telephone Encounter (Signed)
ok 

## 2011-11-12 ENCOUNTER — Other Ambulatory Visit: Payer: Self-pay | Admitting: Internal Medicine

## 2011-11-12 DIAGNOSIS — G4730 Sleep apnea, unspecified: Secondary | ICD-10-CM

## 2011-11-19 ENCOUNTER — Telehealth: Payer: Self-pay | Admitting: Internal Medicine

## 2011-11-19 NOTE — Telephone Encounter (Signed)
Caller: Jlon/Patient; Patient Name: [name]; PCP: Darryll Capers (Adults only); [facility] Callback Phone Number: [phone]; Reason for call: Other   onset around 11-12-11 of swelling and pain on side of foot, area a little hard.  Few weeks ago had cyst removed from same area.  No fever  No red or hot.  All emergent sxs per [facility] protocols R/O except for new skin problem that has perisited more than 7 days    Appt made for 11-20-11 at 0945 with Ms Orvan Falconer

## 2011-11-20 ENCOUNTER — Encounter: Payer: Self-pay | Admitting: Family

## 2011-11-20 ENCOUNTER — Ambulatory Visit (INDEPENDENT_AMBULATORY_CARE_PROVIDER_SITE_OTHER): Payer: Medicare Other | Admitting: Family

## 2011-11-20 VITALS — BP 128/82 | HR 62 | Wt 188.0 lb

## 2011-11-20 DIAGNOSIS — M79609 Pain in unspecified limb: Secondary | ICD-10-CM

## 2011-11-20 DIAGNOSIS — M25879 Other specified joint disorders, unspecified ankle and foot: Secondary | ICD-10-CM

## 2011-11-20 DIAGNOSIS — K219 Gastro-esophageal reflux disease without esophagitis: Secondary | ICD-10-CM

## 2011-11-20 DIAGNOSIS — M79672 Pain in left foot: Secondary | ICD-10-CM

## 2011-11-20 MED ORDER — OMEPRAZOLE 40 MG PO CPDR: 40.0000 mg | DELAYED_RELEASE_CAPSULE | Freq: Every day | ORAL | Status: DC

## 2011-11-20 NOTE — Patient Instructions (Addendum)
The patient is instructed to continue all medications as prescribed. Schedule followup with check out clerk upon leaving the clinic  

## 2011-11-20 NOTE — Progress Notes (Signed)
Subjective:    Patient ID: [name], male    DOB: 1940/06/24, 71 y.o.   MRN: [id number]  HPI 71 year old white male, nonsmoker, patient of Dr. Lovell Sheehan is in today for recheck of the cyst on his foot that was lanced 2 weeks ago by Dr. Lovell Sheehan. He continues to have tenderness. Denies any drainage or discharge, no increase in redness.  Patient also has complaints of increased belching over the last 2 weeks. It is not attributed to anything that he eats. He has not taken any medication for relief. Denies any abdominal pain but does have bloating. Denies nausea, vomiting, diarrhea, constipation.    Review of Systems  Constitutional: Negative.   Respiratory: Negative.   Cardiovascular: Negative.   Gastrointestinal: Negative for diarrhea, constipation and rectal pain.       Increased belching  Musculoskeletal: Positive for gait problem.  Skin: Negative.  Negative for pallor and rash.       Callus to the dorsal left foot  Psychiatric/Behavioral: Negative.    Past Medical History  Diagnosis Date  . Hypertension   . Hyperlipidemia   . Preoperative cardiovascular examination   . Spinal stenosis, lumbar   . CAS (cerebral atherosclerosis)   . Erectile dysfunction   . Preventative health care   . Benign prostatic hypertrophy with urinary obstruction   . Family history of early CAD     male 1st degree relative <50  . Peptic ulcer disease   . Low back pain   . Superficial spreading melanoma   . Sleep apnea, obstructive     CPAP-does not use  . HA (headache)     sinus headaches  . Arthritis     knees  . Myocardial infarction 1997    Hx MI 1997 and 1998  . Anxiety   . Pneumonia 2005  . Anemia   . Depression     takes Zoloft  . Neuropathy, autonomic, idiopathic peripheral, other   . Blood transfusion 1 yr. ago    jerking,fever-after blood transfusion  . Blood transfusion     given wrong blood  . Coronary artery disease 1998    cardiac stent/ Clearance Dr Lovell Sheehan and Jacinto Halim  with note on chart  . Dysrhythmia     hx of atrial fib per ov note of 12/12   . Bradycardia     hx of bradycardia in past per ov note of 12/12    History   Social History  . Marital Status: Married    Spouse Name: N/A    Number of Children: N/A  . Years of Education: N/A   Occupational History  . diesel repair     part time  . worked at [employer] History Main Topics  . Smoking status: Former Smoker -- 1.0 packs/day for 15 years    Types: Cigars    Quit date: 12/23/2009  . Smokeless tobacco: Never Used   Comment: smoked a pipe for 6-7 y ears  . Alcohol Use: No  . Drug Use: No  . Sexually Active: Not on file   Other Topics Concern  . Not on file   Social History Narrative  . No narrative on file    Past Surgical History  Procedure Date  . Esophagogastroduodenoscopy 2004  . Colonoscopy 2004  . Lumbar laminectomy   . Lumbar fusion   . Knee arthroscopy     left  . Shoulder arthroscopy     right  . Knee arthroscopy  right  . Decompression of the median nerve     left wrist and hand  . Revision of tkr 2011    on left  . Coronary stent placement   . Back surgery 2005-last    lumbar x2  . Excisional total knee arthroplasty 12/25/2010    Procedure: EXCISIONAL TOTAL KNEE ARTHROPLASTY;  Surgeon: Shelda Pal;  Location: [facility];  Service: Orthopedics;  Laterality: Right;  repeat irrigation   and debridement, removal and reinsertation of spacer block  . Joint replacement 2011    left knee x 2 ; 08/2010-right knee-infected  . I&d extremity 03/25/2011    Procedure: IRRIGATION AND DEBRIDEMENT EXTREMITY;  Surgeon: Shelda Pal, MD;  Location: [facility];  Service: Orthopedics;  Laterality: Right;  . Coronary angioplasty 1998    stents 1998   . Total knee revision 05/13/2011    Procedure: TOTAL KNEE REVISION;  Surgeon: Shelda Pal, MD;  Location: [facility];  Service: Orthopedics;  Laterality: Right;  Reimplantation    No family history on file.  Allergies    Allergen Reactions  . Acetaminophen Other (See Comments)    LIVER INFLAMMATION IN HIGH DOSES    Current Outpatient Prescriptions on File Prior to Visit  Medication Sig Dispense Refill  . aspirin EC 81 MG tablet Take 81 mg by mouth daily.      . bisoprolol (ZEBETA) 5 MG tablet Take 2.5 mg by mouth daily after breakfast.       . Chlorphen-Phenyleph-ASA (ALKA-SELTZER PLUS COLD PO) Take 1 tablet by mouth daily as needed. For indigestion      . ferrous sulfate (FEOSOL) 325 (65 FE) MG tablet Take 325 mg by mouth daily with breakfast.      . gabapentin (NEURONTIN) 400 MG capsule Take 400 mg by mouth 3 (three) times daily.      Marland Kitchen lisinopril (PRINIVIL,ZESTRIL) 20 MG tablet Take 10 mg by mouth daily.      . meloxicam (MOBIC) 7.5 MG tablet Take 1 tablet by mouth Twice daily.      Marland Kitchen morphine (MS CONTIN) 30 MG 12 hr tablet Take 1 tablet (30 mg total) by mouth 2 (two) times daily.  60 tablet  0  . morphine (MSIR) 15 MG tablet Take 1 tablet (15 mg total) by mouth every 8 (eight) hours as needed. For breakthrough pain. For pain  30 tablet  0  . simvastatin (ZOCOR) 40 MG tablet Take 40 mg by mouth at bedtime.      Marland Kitchen omeprazole (PRILOSEC) 40 MG capsule Take 1 capsule (40 mg total) by mouth daily.  30 capsule  3  . DISCONTD: zolpidem (AMBIEN) 5 MG tablet Take 1 tablet (5 mg total) by mouth at bedtime as needed for sleep.  20 tablet  0    BP 128/82  Pulse 62  Wt 188 lb (85.276 kg)  SpO2 97%chart    Objective:   Physical Exam  Constitutional: He is oriented to person, place, and time. He appears well-developed and well-nourished.  HENT:  Right Ear: External ear normal.  Left Ear: External ear normal.  Nose: Nose normal.  Mouth/Throat: Oropharynx is clear and moist.  Neck: Normal range of motion. Neck supple.  Cardiovascular: Normal rate, regular rhythm and normal heart sounds.   Pulmonary/Chest: Effort normal and breath sounds normal.  Abdominal: Soft. Bowel sounds are normal. He exhibits no  distension. There is no tenderness. There is no rebound.  Neurological: He is alert and oriented to person, place, and time.  Skin:     Psychiatric: He has a normal mood and affect.          Assessment & Plan:  Assessment: GERD, Cyst left foot-resolved, Callus  Plan: Patient appears to have scar tissue to the left foot or possibly a callus, but is deep, we will refer him to podiatry for further management of his foot. Start omeprazole 40 mg once daily. Patient call the office if symptoms worsen or persist. Recheck as scheduled and sooner as needed.

## 2011-12-03 ENCOUNTER — Encounter (HOSPITAL_BASED_OUTPATIENT_CLINIC_OR_DEPARTMENT_OTHER): Payer: Medicare Other

## 2011-12-11 ENCOUNTER — Other Ambulatory Visit: Payer: Self-pay | Admitting: Internal Medicine

## 2011-12-11 MED ORDER — MORPHINE SULFATE 15 MG PO TABS: 15.0000 mg | ORAL_TABLET | Freq: Three times a day (TID) | ORAL | Status: DC | PRN

## 2011-12-11 NOTE — Telephone Encounter (Signed)
Pt needs new rx morphine 15 mg °

## 2011-12-11 NOTE — Telephone Encounter (Signed)
Printed and will call pt when dr jenkins signs and is ready for pick up 

## 2012-01-07 ENCOUNTER — Telehealth: Payer: Self-pay | Admitting: Internal Medicine

## 2012-01-07 MED ORDER — MORPHINE SULFATE ER 30 MG PO TBCR: 30.0000 mg | EXTENDED_RELEASE_TABLET | Freq: Two times a day (BID) | ORAL | Status: DC

## 2012-01-07 NOTE — Telephone Encounter (Signed)
Pt requesting rx refill of Morphine

## 2012-01-07 NOTE — Telephone Encounter (Signed)
Printed script and will call pt after dr Lovell Sheehan signs

## 2012-01-13 ENCOUNTER — Telehealth: Payer: Self-pay | Admitting: Internal Medicine

## 2012-01-13 NOTE — Telephone Encounter (Signed)
Pt called and said that [facility] would not fill pts morphine (MS CONTIN) 30 MG 12 hr tablet because they said it was too early. Pt is req nurse to call the pharmacy and tell them that its ok to fill. [phone].

## 2012-01-13 NOTE — Telephone Encounter (Signed)
Had filled 11-4 at [facility]- too early-pt informed

## 2012-01-14 ENCOUNTER — Telehealth: Payer: Self-pay | Admitting: Internal Medicine

## 2012-01-14 MED ORDER — MORPHINE SULFATE 15 MG PO TABS: 15.0000 mg | ORAL_TABLET | Freq: Three times a day (TID) | ORAL | Status: DC | PRN

## 2012-01-14 NOTE — Telephone Encounter (Signed)
Patient called stating that he need a refill of his morphine (MSIR) 15 MG tablet [[id number]] Take 1 tablet (15 mg total) by mouth every 8 (eight) hours as needed. For breakthrough pain. For pain. Please assist.

## 2012-01-14 NOTE — Telephone Encounter (Signed)
Printed and will call pt when dr Lovell Sheehan signs and is ready for pikc up

## 2012-01-20 ENCOUNTER — Ambulatory Visit: Payer: Medicare Other | Admitting: Internal Medicine

## 2012-01-21 ENCOUNTER — Encounter: Payer: Self-pay | Admitting: Internal Medicine

## 2012-01-21 ENCOUNTER — Ambulatory Visit (INDEPENDENT_AMBULATORY_CARE_PROVIDER_SITE_OTHER): Payer: Medicare Other | Admitting: Internal Medicine

## 2012-01-21 VITALS — BP 164/90 | HR 76 | Temp 98.2°F | Resp 16 | Ht 70.0 in | Wt 178.0 lb

## 2012-01-21 DIAGNOSIS — Z1331 Encounter for screening for depression: Secondary | ICD-10-CM

## 2012-01-21 DIAGNOSIS — M549 Dorsalgia, unspecified: Secondary | ICD-10-CM

## 2012-01-21 DIAGNOSIS — N419 Inflammatory disease of prostate, unspecified: Secondary | ICD-10-CM

## 2012-01-21 MED ORDER — MORPHINE SULFATE ER 30 MG PO TBCR: 30.0000 mg | EXTENDED_RELEASE_TABLET | Freq: Three times a day (TID) | ORAL | Status: DC

## 2012-01-21 MED ORDER — GABAPENTIN 400 MG PO CAPS: 400.0000 mg | ORAL_CAPSULE | Freq: Three times a day (TID) | ORAL | Status: DC

## 2012-01-21 MED ORDER — CIPROFLOXACIN HCL 500 MG PO TABS: 500.0000 mg | ORAL_TABLET | Freq: Two times a day (BID) | ORAL | Status: DC

## 2012-01-21 NOTE — Patient Instructions (Signed)
Take the gabapentin 400 in the MS Contin 3 times a day at the same time

## 2012-01-21 NOTE — Progress Notes (Signed)
Subjective:    Patient ID: [name], male    DOB: 02/06/1941, 71 y.o.   MRN: [id number]  HPI Patient has chronic back pain on MS Contin 30 mg twice daily with complications including persistent pain elevation of blood pressure due to pain and fall risk. He has pain radiating into his back going down his left leg with numbness in his left leg with progressive nuclear symptoms. Dr. Shon Baton is his orthopedic and back surgeon was his original surgery being done by Dr. Leslee Home Last injection 2.5 months Infusion has been proposed   Review of Systems  Constitutional: Negative for fever and fatigue.  HENT: Negative for hearing loss, congestion, neck pain and postnasal drip.   Eyes: Negative for discharge, redness and visual disturbance.  Respiratory: Negative for cough, shortness of breath and wheezing.   Cardiovascular: Negative for leg swelling.  Gastrointestinal: Negative for abdominal pain, constipation and abdominal distention.  Genitourinary: Negative for urgency and frequency.  Musculoskeletal: Positive for back pain, joint swelling and gait problem. Negative for arthralgias.  Skin: Negative for color change and rash.  Neurological: Positive for weakness. Negative for light-headedness.  Hematological: Negative for adenopathy.  Psychiatric/Behavioral: Positive for dysphoric mood and decreased concentration. Negative for behavioral problems. The patient is nervous/anxious.    Past Medical History  Diagnosis Date  . Hypertension   . Hyperlipidemia   . Preoperative cardiovascular examination   . Spinal stenosis, lumbar   . CAS (cerebral atherosclerosis)   . Erectile dysfunction   . Preventative health care   . Benign prostatic hypertrophy with urinary obstruction   . Family history of early CAD     male 1st degree relative <50  . Peptic ulcer disease   . Low back pain   . Superficial spreading melanoma   . Sleep apnea, obstructive     CPAP-does not use  . HA (headache)      sinus headaches  . Arthritis     knees  . Myocardial infarction 1997    Hx MI 1997 and 1998  . Anxiety   . Pneumonia 2005  . Anemia   . Depression     takes Zoloft  . Neuropathy, autonomic, idiopathic peripheral, other   . Blood transfusion 1 yr. ago    jerking,fever-after blood transfusion  . Blood transfusion     given wrong blood  . Coronary artery disease 1998    cardiac stent/ Clearance Dr Lovell Sheehan and Jacinto Halim with note on chart  . Dysrhythmia     hx of atrial fib per ov note of 12/12   . Bradycardia     hx of bradycardia in past per ov note of 12/12    History   Social History  . Marital Status: Married    Spouse Name: N/A    Number of Children: N/A  . Years of Education: N/A   Occupational History  . diesel repair     part time  . worked at [employer] History Main Topics  . Smoking status: Former Smoker -- 1.0 packs/day for 15 years    Types: Cigars    Quit date: 12/23/2009  . Smokeless tobacco: Never Used     Comment: smoked a pipe for 6-7 y ears  . Alcohol Use: No  . Drug Use: No  . Sexually Active: Not on file   Other Topics Concern  . Not on file   Social History Narrative  . No narrative on file    Past Surgical History  Procedure Date  . Esophagogastroduodenoscopy 2004  . Colonoscopy 2004  . Lumbar laminectomy   . Lumbar fusion   . Knee arthroscopy     left  . Shoulder arthroscopy     right  . Knee arthroscopy     right  . Decompression of the median nerve     left wrist and hand  . Revision of tkr 2011    on left  . Coronary stent placement   . Back surgery 2005-last    lumbar x2  . Excisional total knee arthroplasty 12/25/2010    Procedure: EXCISIONAL TOTAL KNEE ARTHROPLASTY;  Surgeon: Shelda Pal;  Location: [facility];  Service: Orthopedics;  Laterality: Right;  repeat irrigation   and debridement, removal and reinsertation of spacer block  . Joint replacement 2011    left knee x 2 ; 08/2010-right knee-infected  . I&d  extremity 03/25/2011    Procedure: IRRIGATION AND DEBRIDEMENT EXTREMITY;  Surgeon: Shelda Pal, MD;  Location: [facility];  Service: Orthopedics;  Laterality: Right;  . Coronary angioplasty 1998    stents 1998   . Total knee revision 05/13/2011    Procedure: TOTAL KNEE REVISION;  Surgeon: Shelda Pal, MD;  Location: [facility];  Service: Orthopedics;  Laterality: Right;  Reimplantation    No family history on file.  Allergies  Allergen Reactions  . Acetaminophen Other (See Comments)    LIVER INFLAMMATION IN HIGH DOSES    Current Outpatient Prescriptions on File Prior to Visit  Medication Sig Dispense Refill  . aspirin EC 81 MG tablet Take 81 mg by mouth daily.      . bisoprolol (ZEBETA) 5 MG tablet Take 2.5 mg by mouth daily after breakfast.       . Chlorphen-Phenyleph-ASA (ALKA-SELTZER PLUS COLD PO) Take 1 tablet by mouth daily as needed. For indigestion      . ferrous sulfate (FEOSOL) 325 (65 FE) MG tablet Take 325 mg by mouth daily with breakfast.      . gabapentin (NEURONTIN) 400 MG capsule Take 400 mg by mouth 3 (three) times daily.      Marland Kitchen lisinopril (PRINIVIL,ZESTRIL) 20 MG tablet Take 10 mg by mouth daily.      . meloxicam (MOBIC) 7.5 MG tablet Take 1 tablet by mouth Twice daily.      Marland Kitchen morphine (MS CONTIN) 30 MG 12 hr tablet Take 1 tablet (30 mg total) by mouth 2 (two) times daily.  60 tablet  0  . morphine (MSIR) 15 MG tablet Take 1 tablet (15 mg total) by mouth every 8 (eight) hours as needed. For breakthrough pain. For pain  30 tablet  0  . omeprazole (PRILOSEC) 40 MG capsule Take 1 capsule (40 mg total) by mouth daily.  30 capsule  3  . simvastatin (ZOCOR) 40 MG tablet Take 40 mg by mouth at bedtime.      . [DISCONTINUED] zolpidem (AMBIEN) 5 MG tablet Take 1 tablet (5 mg total) by mouth at bedtime as needed for sleep.  20 tablet  0    BP 164/90  Pulse 76  Temp 98.2 F (36.8 C)  Resp 16  Ht 5\' 10"  (1.778 m)  Wt 178 lb (80.74 kg)  BMI 25.54 kg/m2      Objective:    Physical Exam  Nursing note and vitals reviewed. Constitutional: He appears well-developed and well-nourished.  HENT:  Head: Normocephalic and atraumatic.  Eyes: Conjunctivae normal are normal. Pupils are equal, round, and reactive to light.  Neck:  Normal range of motion. Neck supple.  Cardiovascular: Normal rate and regular rhythm.   Pulmonary/Chest: Effort normal and breath sounds normal.  Abdominal: Soft. Bowel sounds are normal.          Assessment & Plan:  CHronic back pain with surgery planned Mood and sensorium alteration with medications Pain elevation in BP noted Increase the mscontin to tid for a total dose of 30 and nrease the neurotin to 400 tid

## 2012-01-23 ENCOUNTER — Telehealth: Payer: Self-pay | Admitting: Internal Medicine

## 2012-01-23 NOTE — Telephone Encounter (Signed)
Left message on machine Call and make ov to be seen

## 2012-01-23 NOTE — Telephone Encounter (Signed)
Patient called stating that he has been unsuccessful with trying to reach triage and he is having left jaw and lip numbness that comes and goes and he is not sure if it is related to the neck issue that he has. Please advise.

## 2012-01-29 ENCOUNTER — Telehealth: Payer: Self-pay | Admitting: Internal Medicine

## 2012-01-29 NOTE — Telephone Encounter (Signed)
Pt needs new rx morphine 15 mg #30

## 2012-01-29 NOTE — Telephone Encounter (Signed)
Pt callback disregard previous message

## 2012-01-31 ENCOUNTER — Telehealth: Payer: Self-pay | Admitting: Internal Medicine

## 2012-01-31 NOTE — Telephone Encounter (Signed)
Pt informed too early-just filled #90 on 12-3

## 2012-01-31 NOTE — Telephone Encounter (Signed)
Pt needs new rx morphine 15 mg °

## 2012-02-04 ENCOUNTER — Other Ambulatory Visit: Payer: Self-pay | Admitting: *Deleted

## 2012-02-04 MED ORDER — BACLOFEN 10 MG PO TABS: 10.0000 mg | ORAL_TABLET | Freq: Three times a day (TID) | ORAL | Status: DC

## 2012-02-07 ENCOUNTER — Ambulatory Visit (INDEPENDENT_AMBULATORY_CARE_PROVIDER_SITE_OTHER): Payer: Medicare Other | Admitting: Family Medicine

## 2012-02-07 ENCOUNTER — Encounter: Payer: Self-pay | Admitting: Family Medicine

## 2012-02-07 ENCOUNTER — Ambulatory Visit: Payer: Medicare Other | Admitting: Family Medicine

## 2012-02-07 ENCOUNTER — Telehealth: Payer: Self-pay | Admitting: Internal Medicine

## 2012-02-07 VITALS — BP 138/100 | HR 66 | Temp 98.2°F | Wt 180.0 lb

## 2012-02-07 DIAGNOSIS — R350 Frequency of micturition: Secondary | ICD-10-CM

## 2012-02-07 NOTE — Telephone Encounter (Signed)
Pt instructed to call back if urinary problem not better, and it is NOT. Pt took all antibiotics prescribed. Also, pt also needs refill of : morphine (MSIR) 15 MG

## 2012-02-07 NOTE — Telephone Encounter (Signed)
appt scheduled with Dr Kim 

## 2012-02-07 NOTE — Progress Notes (Signed)
Chief Complaint  Patient presents with  . Urinary Frequency    HPI: Urinary Frequency: -per review of chart started on cipro on 12/3 for a short 2 week course -pt reports was experiencing urinary frequency (3-4x per day, sometimes a few times per night) and was diagnosed with prostatitis and tx with cipro for two week -does drink a lot of water all day long because his pain medications dry him out and cause constipation - when drinks this much makes him urinate frequently and has for some time -only has symptoms when he drinks a lot of water - when he gets busy and forgets to drink - doesn't have the urinary symptoms. He is afraid of getting constipation from his pain meds if he doesn't drink a lot of water. -he did want to make sure doesn't have a UTI -Denies: urgency, hesitancy, dysuria, pain, fevers, flank pain, nausea, vomiting -urine is clear and odorless he reports -no labs to review from last appt  ROS: See pertinent positives and negatives per HPI.  Past Medical History  Diagnosis Date  . Hypertension   . Hyperlipidemia   . Preoperative cardiovascular examination   . Spinal stenosis, lumbar   . CAS (cerebral atherosclerosis)   . Erectile dysfunction   . Preventative health care   . Benign prostatic hypertrophy with urinary obstruction   . Family history of early CAD     male 1st degree relative <50  . Peptic ulcer disease   . Low back pain   . Superficial spreading melanoma   . Sleep apnea, obstructive     CPAP-does not use  . HA (headache)     sinus headaches  . Arthritis     knees  . Myocardial infarction 1997    Hx MI 1997 and 1998  . Anxiety   . Pneumonia 2005  . Anemia   . Depression     takes Zoloft  . Neuropathy, autonomic, idiopathic peripheral, other   . Blood transfusion 1 yr. ago    jerking,fever-after blood transfusion  . Blood transfusion     given wrong blood  . Coronary artery disease 1998    cardiac stent/ Clearance Dr Lovell Sheehan and Jacinto Halim with  note on chart  . Dysrhythmia     hx of atrial fib per ov note of 12/12   . Bradycardia     hx of bradycardia in past per ov note of 12/12    No family history on file.  History   Social History  . Marital Status: Married    Spouse Name: N/A    Number of Children: N/A  . Years of Education: N/A   Occupational History  . diesel repair     part time  . worked at [employer] History Main Topics  . Smoking status: Former Smoker -- 1.0 packs/day for 15 years    Types: Cigars    Quit date: 12/23/2009  . Smokeless tobacco: Never Used     Comment: smoked a pipe for 6-7 y ears  . Alcohol Use: No  . Drug Use: No  . Sexually Active: None   Other Topics Concern  . None   Social History Narrative  . None    Current outpatient prescriptions:aspirin EC 81 MG tablet, Take 81 mg by mouth daily., Disp: , Rfl: ;  Azelastine-Fluticasone (DYMISTA) 137-50 MCG/ACT SUSP, Place into the nose 2 (two) times daily., Disp: , Rfl: ;  baclofen (LIORESAL) 10 MG tablet, Take 1 tablet (10 mg  total) by mouth 3 (three) times daily., Disp: 30 each, Rfl: 3;  bisoprolol (ZEBETA) 5 MG tablet, Take 2.5 mg by mouth daily after breakfast. , Disp: , Rfl:  Chlorphen-Phenyleph-ASA (ALKA-SELTZER PLUS COLD PO), Take 1 tablet by mouth daily as needed. For indigestion, Disp: , Rfl: ;  ciprofloxacin (CIPRO) 500 MG tablet, Take 1 tablet (500 mg total) by mouth 2 (two) times daily., Disp: 28 tablet, Rfl: 0;  ferrous sulfate (FEOSOL) 325 (65 FE) MG tablet, Take 325 mg by mouth daily with breakfast., Disp: , Rfl:  gabapentin (NEURONTIN) 400 MG capsule, Take 1 capsule (400 mg total) by mouth 3 (three) times daily., Disp: 90 capsule, Rfl: 3;  lisinopril (PRINIVIL,ZESTRIL) 20 MG tablet, Take 10 mg by mouth daily., Disp: , Rfl: ;  meloxicam (MOBIC) 7.5 MG tablet, Take 1 tablet by mouth Twice daily., Disp: , Rfl: ;  morphine (MS CONTIN) 30 MG 12 hr tablet, Take 1 tablet (30 mg total) by mouth 3 (three) times daily., Disp: 90  tablet, Rfl: 0 morphine (MSIR) 15 MG tablet, Take 1 tablet (15 mg total) by mouth every 8 (eight) hours as needed. For breakthrough pain. For pain, Disp: 30 tablet, Rfl: 0;  omeprazole (PRILOSEC) 40 MG capsule, Take 1 capsule (40 mg total) by mouth daily., Disp: 30 capsule, Rfl: 3;  simvastatin (ZOCOR) 40 MG tablet, Take 40 mg by mouth at bedtime., Disp: , Rfl:  [DISCONTINUED] zolpidem (AMBIEN) 5 MG tablet, Take 1 tablet (5 mg total) by mouth at bedtime as needed for sleep., Disp: 20 tablet, Rfl: 0  EXAM:  Filed Vitals:   02/07/12 1602  BP: 138/100  Pulse: 66  Temp: 98.2 F (36.8 C)    There is no height on file to calculate BMI.  GENERAL: vitals reviewed and listed above, alert, oriented, appears well hydrated and in no acute distress  HEENT: atraumatic, conjunttiva clear, no obvious abnormalities on inspection of external nose and ears  NECK: no obvious masses on inspection  LUNGS: clear to auscultation bilaterally, no wheezes, rales or rhonchi, good air movement  CV: HRRR, no peripheral edema  ABD: BS+, soft, NTTP, no CVA TTP  MS: moves all extremities without noticeable abnormality  PSYCH: pleasant and cooperative, no obvious depression or anxiety  ASSESSMENT AND PLAN:  Discussed the following assessment and plan:  1. Urinary frequency  Culture, Urine   -will check Urine culture today and if he has infection will prolong tx - but smx not c/w infection and more likely from drinking lots of water or overactive bladder -pt has follow up with PCP scheduled - if no inf can discuss further management then if still a problem -Patient advised to return or notify a doctor immediately if symptoms worsen or persist or new concerns arise.  Patient Instructions  -your urinary symptoms may be from all of the water - but it is good to stay hydrated  -we are checking your urine today and will let you know if there is any sign of infection  -follow up with your doctor as scheduled  or sooner if concerns     Kriste Basque R.

## 2012-02-07 NOTE — Patient Instructions (Addendum)
-  your urinary symptoms may be from all of the water - but it is good to stay hydrated  -we are checking your urine today and will let you know if there is any sign of infection  -follow up with your doctor as scheduled or sooner if concerns

## 2012-02-10 ENCOUNTER — Emergency Department (HOSPITAL_COMMUNITY): Payer: Medicare Other

## 2012-02-10 ENCOUNTER — Emergency Department (HOSPITAL_COMMUNITY)
Admission: EM | Admit: 2012-02-10 | Discharge: 2012-02-10 | Disposition: A | Discharge status: Home / Self Care | Payer: Medicare Other | Attending: Emergency Medicine | Admitting: Emergency Medicine

## 2012-02-10 ENCOUNTER — Encounter (HOSPITAL_COMMUNITY): Payer: Self-pay | Admitting: Emergency Medicine

## 2012-02-10 DIAGNOSIS — M545 Low back pain, unspecified: Secondary | ICD-10-CM | POA: Insufficient documentation

## 2012-02-10 DIAGNOSIS — Z9889 Other specified postprocedural states: Secondary | ICD-10-CM | POA: Insufficient documentation

## 2012-02-10 DIAGNOSIS — I10 Essential (primary) hypertension: Secondary | ICD-10-CM | POA: Insufficient documentation

## 2012-02-10 DIAGNOSIS — Z862 Personal history of diseases of the blood and blood-forming organs and certain disorders involving the immune mechanism: Secondary | ICD-10-CM | POA: Insufficient documentation

## 2012-02-10 DIAGNOSIS — K5900 Constipation, unspecified: Secondary | ICD-10-CM

## 2012-02-10 DIAGNOSIS — F411 Generalized anxiety disorder: Secondary | ICD-10-CM | POA: Insufficient documentation

## 2012-02-10 DIAGNOSIS — I4891 Unspecified atrial fibrillation: Secondary | ICD-10-CM | POA: Insufficient documentation

## 2012-02-10 DIAGNOSIS — R1084 Generalized abdominal pain: Secondary | ICD-10-CM

## 2012-02-10 DIAGNOSIS — N401 Enlarged prostate with lower urinary tract symptoms: Secondary | ICD-10-CM | POA: Insufficient documentation

## 2012-02-10 DIAGNOSIS — Z7982 Long term (current) use of aspirin: Secondary | ICD-10-CM | POA: Insufficient documentation

## 2012-02-10 DIAGNOSIS — Z951 Presence of aortocoronary bypass graft: Secondary | ICD-10-CM | POA: Insufficient documentation

## 2012-02-10 DIAGNOSIS — F329 Major depressive disorder, single episode, unspecified: Secondary | ICD-10-CM | POA: Insufficient documentation

## 2012-02-10 DIAGNOSIS — G9009 Other idiopathic peripheral autonomic neuropathy: Secondary | ICD-10-CM | POA: Insufficient documentation

## 2012-02-10 DIAGNOSIS — K277 Chronic peptic ulcer, site unspecified, without hemorrhage or perforation: Secondary | ICD-10-CM | POA: Insufficient documentation

## 2012-02-10 DIAGNOSIS — E785 Hyperlipidemia, unspecified: Secondary | ICD-10-CM | POA: Insufficient documentation

## 2012-02-10 DIAGNOSIS — N529 Male erectile dysfunction, unspecified: Secondary | ICD-10-CM | POA: Insufficient documentation

## 2012-02-10 DIAGNOSIS — Z8701 Personal history of pneumonia (recurrent): Secondary | ICD-10-CM | POA: Insufficient documentation

## 2012-02-10 DIAGNOSIS — I252 Old myocardial infarction: Secondary | ICD-10-CM | POA: Insufficient documentation

## 2012-02-10 DIAGNOSIS — G4733 Obstructive sleep apnea (adult) (pediatric): Secondary | ICD-10-CM | POA: Insufficient documentation

## 2012-02-10 DIAGNOSIS — F3289 Other specified depressive episodes: Secondary | ICD-10-CM | POA: Insufficient documentation

## 2012-02-10 DIAGNOSIS — M1710 Unilateral primary osteoarthritis, unspecified knee: Secondary | ICD-10-CM | POA: Insufficient documentation

## 2012-02-10 DIAGNOSIS — I672 Cerebral atherosclerosis: Secondary | ICD-10-CM | POA: Insufficient documentation

## 2012-02-10 DIAGNOSIS — Z79899 Other long term (current) drug therapy: Secondary | ICD-10-CM | POA: Insufficient documentation

## 2012-02-10 DIAGNOSIS — Z87891 Personal history of nicotine dependence: Secondary | ICD-10-CM | POA: Insufficient documentation

## 2012-02-10 DIAGNOSIS — R51 Headache: Secondary | ICD-10-CM | POA: Insufficient documentation

## 2012-02-10 DIAGNOSIS — M48061 Spinal stenosis, lumbar region without neurogenic claudication: Secondary | ICD-10-CM | POA: Insufficient documentation

## 2012-02-10 DIAGNOSIS — N138 Other obstructive and reflux uropathy: Secondary | ICD-10-CM | POA: Insufficient documentation

## 2012-02-10 LAB — COMPREHENSIVE METABOLIC PANEL
ALT: 16 U/L (ref 0–53)
AST: 20 U/L (ref 0–37)
Alkaline Phosphatase: 119 U/L — ABNORMAL HIGH (ref 39–117)
CO2: 22 mEq/L (ref 19–32)
GFR calc Af Amer: >90 mL/min (ref >90)
Glucose, Bld: 90 mg/dL (ref 70–99)
Potassium: 3.6 mEq/L (ref 3.5–5.1)
Sodium: 139 mEq/L (ref 135–145)
Total Protein: 6.7 g/dL (ref 6.0–8.3)

## 2012-02-10 LAB — CBC WITH DIFFERENTIAL/PLATELET
Basophils Relative: 0 % (ref 0–1)
Hemoglobin: 14.5 g/dL (ref 13.0–17.0)
Lymphs Abs: 2.5 10*3/uL (ref 0.7–4.0)
Monocytes Relative: 7 % (ref 3–12)
Neutro Abs: 5.1 10*3/uL (ref 1.7–7.7)
Neutrophils Relative %: 61 % (ref 43–77)
Platelets: 172 10*3/uL (ref 150–400)
RBC: 4.46 MIL/uL (ref 4.22–5.81)

## 2012-02-10 LAB — URINALYSIS, ROUTINE W REFLEX MICROSCOPIC
Leukocytes, UA: NEGATIVE
Nitrite: NEGATIVE
Specific Gravity, Urine: 1.011 (ref 1.005–1.030)
pH: 7.5 (ref 5.0–8.0)

## 2012-02-10 LAB — LIPASE, BLOOD: Lipase: 24 U/L (ref 11–59)

## 2012-02-10 MED ORDER — POLYETHYLENE GLYCOL 3350 17 G PO PACK: 17.0000 g | PACK | Freq: Every day | ORAL | Status: DC

## 2012-02-10 MED ORDER — MORPHINE SULFATE 4 MG/ML IJ SOLN
4.0000 mg | Freq: Once | INTRAMUSCULAR | Status: AC
  Administered 2012-02-10: 4 mg via INTRAVENOUS
  Filled 2012-02-10: qty 1

## 2012-02-10 MED ORDER — ONDANSETRON HCL 4 MG/2ML IJ SOLN
4.0000 mg | Freq: Once | INTRAMUSCULAR | Status: AC
  Administered 2012-02-10: 4 mg via INTRAVENOUS
  Filled 2012-02-10: qty 2

## 2012-02-10 MED ORDER — IOHEXOL 300 MG/ML  SOLN
100.0000 mL | Freq: Once | INTRAMUSCULAR | Status: AC | PRN
  Administered 2012-02-10: 100 mL via INTRAVENOUS

## 2012-02-10 NOTE — ED Notes (Signed)
Patient transported to CT 

## 2012-02-10 NOTE — ED Notes (Signed)
Per EMS, pt. Is from home with complaint of abdominal pain which started yesterday noon and got worst this evening. Pt. Denies N/V  Nor diarrhea. Alert and oriented, no SOB .

## 2012-02-10 NOTE — ED Provider Notes (Signed)
History     CSN: [id number]  Arrival date & time 02/10/12  0218   First MD Initiated Contact with Patient 02/10/12 0251      Chief Complaint  Patient presents with  . Abdominal Pain    (Consider location/radiation/quality/duration/timing/severity/associated sxs/prior treatment) HPI 71 year old male presents to the emergency department complaining of abdominal pain ongoing for the last week. Patient reports he has felt bloated after eating, and is having left-sided abdominal pain. Patient has been taking Pepto-Bismol without improvement. Beginning yesterday morning he noted the pain was worse and he has been feeling nauseated. He has not had any vomiting. No fevers. No prior abdominal surgeries. About an hour prior to arrival, patient woke with severe pain which has since eased off. He has noticed that his stools are dark in color. No prior history of diverticulitis, diverticulosis, GI bleeding. He denies any weakness or dizziness with standing.  Past Medical History  Diagnosis Date  . Hypertension   . Hyperlipidemia   . Preoperative cardiovascular examination   . Spinal stenosis, lumbar   . CAS (cerebral atherosclerosis)   . Erectile dysfunction   . Preventative health care   . Benign prostatic hypertrophy with urinary obstruction   . Family history of early CAD     male 1st degree relative <50  . Peptic ulcer disease   . Low back pain   . Superficial spreading melanoma   . Sleep apnea, obstructive     CPAP-does not use  . HA (headache)     sinus headaches  . Arthritis     knees  . Myocardial infarction 1997    Hx MI 1997 and 1998  . Anxiety   . Pneumonia 2005  . Anemia   . Depression     takes Zoloft  . Neuropathy, autonomic, idiopathic peripheral, other   . Blood transfusion 1 yr. ago    jerking,fever-after blood transfusion  . Blood transfusion     given wrong blood  . Coronary artery disease 1998    cardiac stent/ Clearance Dr Lovell Sheehan and Jacinto Halim with note on  chart  . Dysrhythmia     hx of atrial fib per ov note of 12/12   . Bradycardia     hx of bradycardia in past per ov note of 12/12    Past Surgical History  Procedure Date  . Esophagogastroduodenoscopy 2004  . Colonoscopy 2004  . Lumbar laminectomy   . Lumbar fusion   . Knee arthroscopy     left  . Shoulder arthroscopy     right  . Knee arthroscopy     right  . Decompression of the median nerve     left wrist and hand  . Revision of tkr 2011    on left  . Coronary stent placement   . Back surgery 2005-last    lumbar x2  . Excisional total knee arthroplasty 12/25/2010    Procedure: EXCISIONAL TOTAL KNEE ARTHROPLASTY;  Surgeon: Shelda Pal;  Location: [facility];  Service: Orthopedics;  Laterality: Right;  repeat irrigation   and debridement, removal and reinsertation of spacer block  . Joint replacement 2011    left knee x 2 ; 08/2010-right knee-infected  . I&d extremity 03/25/2011    Procedure: IRRIGATION AND DEBRIDEMENT EXTREMITY;  Surgeon: Shelda Pal, MD;  Location: [facility];  Service: Orthopedics;  Laterality: Right;  . Coronary angioplasty 1998    stents 1998   . Total knee revision 05/13/2011    Procedure: TOTAL KNEE REVISION;  Surgeon: Shelda Pal, MD;  Location: [facility];  Service: Orthopedics;  Laterality: Right;  Reimplantation    History reviewed. No pertinent family history.  History  Substance Use Topics  . Smoking status: Former Smoker -- 1.0 packs/day for 15 years    Types: Cigars    Quit date: 12/23/2009  . Smokeless tobacco: Never Used     Comment: smoked a pipe for 6-7 y ears  . Alcohol Use: No      Review of Systems  See History of Present Illness; otherwise all other systems are reviewed and negative  Allergies  Acetaminophen  Home Medications   Current Outpatient Rx  Name  Route  Sig  Dispense  Refill  . ASPIRIN EC 81 MG PO TBEC   Oral   Take 81 mg by mouth daily.         . AZELASTINE-FLUTICASONE 137-50 MCG/ACT NA SUSP   Nasal    Place into the nose 2 (two) times daily.         Marland Kitchen BACLOFEN 10 MG PO TABS   Oral   Take 10 mg by mouth 3 (three) times daily as needed.         Marland Kitchen BISOPROLOL FUMARATE 5 MG PO TABS   Oral   Take 2.5 mg by mouth daily after breakfast.          . ALKA-SELTZER PLUS COLD PO   Oral   Take 1 tablet by mouth daily as needed. For indigestion         . CIPROFLOXACIN HCL 500 MG PO TABS   Oral   Take 500 mg by mouth 2 (two) times daily.         Marland Kitchen FERROUS SULFATE 325 (65 FE) MG PO TABS   Oral   Take 325 mg by mouth daily with breakfast.         . GABAPENTIN 400 MG PO CAPS   Oral   Take 400 mg by mouth 3 (three) times daily.         Marland Kitchen LISINOPRIL 20 MG PO TABS   Oral   Take 10 mg by mouth daily.         . MORPHINE SULFATE ER 30 MG PO TBCR   Oral   Take 1 tablet (30 mg total) by mouth 3 (three) times daily.   90 tablet   0   . MORPHINE SULFATE 15 MG PO TABS   Oral   Take 1 tablet (15 mg total) by mouth every 8 (eight) hours as needed. For breakthrough pain. For pain   30 tablet   0   . OMEPRAZOLE 40 MG PO CPDR   Oral   Take 1 capsule (40 mg total) by mouth daily.   30 capsule   3   . SIMVASTATIN 40 MG PO TABS   Oral   Take 40 mg by mouth at bedtime.           BP 143/80  Pulse 52  Temp 98.7 F (37.1 C) (Oral)  Resp 17  SpO2 100%  Physical Exam  Nursing note and vitals reviewed. Constitutional: He is oriented to person, place, and time. He appears well-developed and well-nourished.  HENT:  Head: Normocephalic and atraumatic.  Nose: Nose normal.  Mouth/Throat: Oropharynx is clear and moist.  Eyes: Conjunctivae normal and EOM are normal. Pupils are equal, round, and reactive to light.  Neck: Normal range of motion. Neck supple. No JVD present. No tracheal deviation present. No thyromegaly present.  Cardiovascular: Normal rate, regular rhythm, normal heart sounds and intact distal pulses.  Exam reveals no gallop and no friction rub.   No murmur  heard. Pulmonary/Chest: Effort normal and breath sounds normal. No stridor. No respiratory distress. He has no wheezes. He has no rales. He exhibits no tenderness.  Abdominal: Soft. He exhibits no distension and no mass. There is tenderness (mild diffuse tenderness, increased bowel sounds). There is no rebound and no guarding.  Genitourinary: Rectum normal, prostate normal and penis normal.  Musculoskeletal: Normal range of motion. He exhibits no edema and no tenderness.  Lymphadenopathy:    He has no cervical adenopathy.  Neurological: He is oriented to person, place, and time. He has normal reflexes. No cranial nerve deficit. He exhibits normal muscle tone. Coordination normal.  Skin: Skin is warm and dry. No rash noted. No erythema. No pallor.  Psychiatric: He has a normal mood and affect. His behavior is normal. Judgment and thought content normal.    ED Course  Procedures (including critical care time)  Labs Reviewed  URINALYSIS, ROUTINE W REFLEX MICROSCOPIC - Abnormal; Notable for the following:    APPearance CLOUDY (*)     All other components within normal limits  COMPREHENSIVE METABOLIC PANEL - Abnormal; Notable for the following:    Alkaline Phosphatase 119 (*)     GFR calc non Af Amer 81 (*)     All other components within normal limits  CBC WITH DIFFERENTIAL  LIPASE, BLOOD  OCCULT BLOOD, POC DEVICE   Ct Abdomen Pelvis W Contrast  02/10/2012  *RADIOLOGY REPORT*  Clinical Data: Worsening abdominal pain for 2 days.  CT ABDOMEN AND PELVIS WITH CONTRAST  Technique:  Multidetector CT imaging of the abdomen and pelvis was performed following the standard protocol during bolus administration of intravenous contrast.  Contrast: OMNIPAQUE IOHEXOL 300 MG/ML  SOLN  Comparison: CT of the lumbar spine performed 08/12/2011  Findings: Minimal left basilar atelectasis or scarring is noted. Scattered coronary artery calcifications are seen.  The liver and spleen are unremarkable in  appearance.  The gallbladder is within normal limits.  The pancreas and adrenal glands are unremarkable.  There is incomplete superior migration of the right kidney, and incomplete rotation.  No hydronephrosis is seen.  No renal or ureteral stones are identified.  Nonspecific left-sided perinephric stranding and trace fluid seen.  Mild scattered hypodensities within the left kidney likely reflect small cysts.  No free fluid is identified.  The small bowel is unremarkable in appearance.  The stomach is within normal limits.  No acute vascular abnormalities are seen.  There is mild ectasia of the distal abdominal aorta, measuring up to 2.6 cm in AP dimension. Scattered calcification is noted along the abdominal aorta and its branches.  The appendix is normal in caliber, without evidence for appendicitis.  Contrast progresses to the level of the proximal transverse colon.  The remainder of the colon is largely filled with stool.  The colon is grossly unremarkable in appearance.  The bladder is mildly distended and grossly normal in appearance. Calcification is noted within the prostate; the prostate is grossly unremarkable in appearance.  No inguinal lymphadenopathy is seen.  No acute osseous abnormalities are identified.  Multilevel vacuum phenomenon is noted along the lumbar spine.  Chronic right-sided pars defects are noted at L3 and L4 on the right side, without evidence of anterolisthesis.  IMPRESSION:  1.  No acute abnormality seen within the abdomen or pelvis. 2.  Incomplete superior migration and incomplete  rotation of the right kidney.  No evidence of hydronephrosis. 3.  Chronic right-sided pars defects at L3 and L4 on the right side, without evidence of anterolisthesis.  Degenerative change noted along the lumbar spine. 4.  Scattered calcification along the abdominal aorta and its branches; scattered coronary artery calcifications seen. 5.  Ectatic abdominal aorta at risk for aneurysm development. Recommend  followup by [place] in 5 years. This recommendation follows ACR consensus guidelines: White Paper of the ACR Incidental Findings Committee II on Vascular Findings. J Am Coll Radiol 2013; 10:789-794.   Original Report Authenticated By: Tonia Ghent, M.D.      1. Abdominal pain, diffuse   2. Constipation       MDM  71 year old male with a week of abdominal pain. Differential includes diverticulitis, appendicitis, dyspepsia. We'll check labs, UA, CT scan.        Olivia Mackie, MD 02/10/12 [id number]

## 2012-02-10 NOTE — ED Notes (Signed)
ZOX:[id number]<AV> Expected date:<BR> Expected time:<BR> Means of arrival:<BR> Comments:<BR> Medic, Abd Pain

## 2012-02-17 ENCOUNTER — Telehealth: Payer: Self-pay | Admitting: Internal Medicine

## 2012-02-17 ENCOUNTER — Other Ambulatory Visit: Payer: Self-pay | Admitting: *Deleted

## 2012-02-17 DIAGNOSIS — M549 Dorsalgia, unspecified: Secondary | ICD-10-CM

## 2012-02-17 MED ORDER — MORPHINE SULFATE 15 MG PO TABS: 15.0000 mg | ORAL_TABLET | Freq: Three times a day (TID) | ORAL | Status: DC | PRN

## 2012-02-17 MED ORDER — MORPHINE SULFATE ER 30 MG PO TBCR: 30.0000 mg | EXTENDED_RELEASE_TABLET | Freq: Three times a day (TID) | ORAL | Status: DC

## 2012-02-17 NOTE — Telephone Encounter (Signed)
Samples out front pt aware 

## 2012-02-17 NOTE — Telephone Encounter (Signed)
Patient called stating that he need a refill of his morphine (MS CONTIN) 30 MG 12 hr tablet [[id number]] Take 1 tablet (30 mg total) by mouth 3 (three) times daily morphine (MSIR) 15 MG tablet [[id number]] Take 1 tablet (15 mg total) by mouth every 8 (eight) hours as needed. For breakthrough pain. For pain. Please assist.

## 2012-02-17 NOTE — Telephone Encounter (Signed)
Printed and will call pt and let him after dr Lovell Sheehan so he can pick up

## 2012-02-25 ENCOUNTER — Telehealth: Payer: Self-pay | Admitting: Internal Medicine

## 2012-02-25 NOTE — Telephone Encounter (Signed)
Patient called stating that the meds he was put on are working and he does not need a break through. Patient states that Dr. Ethelene Hal plans to put a stimulator in his back and he wants to know what Dr. Lovell Sheehan thinks of this. Please advise.

## 2012-02-25 NOTE — Telephone Encounter (Signed)
Left message on machine Per dr Lovell Sheehan- he thinks that is a great idea

## 2012-03-09 ENCOUNTER — Telehealth: Payer: Self-pay | Admitting: Internal Medicine

## 2012-03-09 NOTE — Telephone Encounter (Signed)
[facility] Triage Call Report Triage Record Num: [id number] Operator: Ether Griffins Patient Name: [name] Call Date & Time: 03/06/2012 4:57:55PM Patient Phone: [phone] PCP: Darryll Capers Patient Gender: Male PCP Fax : [phone] Patient DOB: [birth year] Practice Name[facility] Reason for Call: Caller: Standley Brooking; PCP: Darryll Capers (Adults only); CB#: [phone]; Calling about dizziness that lasted all day but is gone now, back pain was worse than it has been when he got up this am. Onset 03/06/12. Has had dizzy spells before but they never lasted this long, also hx of cluster headaches. Dizziness is now gone. Guideline: Dizziness or Vertigo. Disposition: Provide Home/Self Care, Reason for Disposition: History of similar attakcs with migraine headaches OR Meniere's syndrome AND symptoms are typical. Advised to follow up with PCP next week. Home care advice given. Protocol(s) Used: Dizziness or Vertigo Recommended Outcome per Protocol: Provide Home/Self Care Reason for Outcome: History of similar attacks with migraine headaches OR Meniere's syndrome AND symptoms are typical Care Advice: Change position slowly. Sit for a couple of minutes before standing to walk. Quick position changes may cause or worsen symptoms. ~ Call provider if have other symptoms including weakness, hearing loss, ringing in ears, or dizziness that does not go away. ~ ~ SYMPTOM / CONDITION MANAGEMENT ~ CAUTIONS The symptoms may be lessened by the following: - Reduce salt in diet to decrease fluid retention. To reduce salt, avoid all processed foods and do not add salt in cooking. - Avoid alcohol, caffeinated beverages and tobacco. - Try to reduce stress levels. ~ 03/06/2012 5:22:05PM Page 1 of 1 [id number]

## 2012-03-10 ENCOUNTER — Other Ambulatory Visit: Payer: Self-pay | Admitting: *Deleted

## 2012-03-10 MED ORDER — BACLOFEN 10 MG PO TABS: 10.0000 mg | ORAL_TABLET | Freq: Three times a day (TID) | ORAL | Status: DC | PRN

## 2012-03-20 ENCOUNTER — Emergency Department (HOSPITAL_COMMUNITY)
Admission: EM | Admit: 2012-03-20 | Discharge: 2012-03-20 | Disposition: A | Discharge status: Home / Self Care | Payer: Medicare Other | Attending: Emergency Medicine | Admitting: Emergency Medicine

## 2012-03-20 ENCOUNTER — Emergency Department (HOSPITAL_COMMUNITY): Payer: Medicare Other

## 2012-03-20 ENCOUNTER — Telehealth: Payer: Self-pay | Admitting: Internal Medicine

## 2012-03-20 ENCOUNTER — Ambulatory Visit (INDEPENDENT_AMBULATORY_CARE_PROVIDER_SITE_OTHER): Payer: Medicare Other | Admitting: Family Medicine

## 2012-03-20 ENCOUNTER — Encounter (HOSPITAL_COMMUNITY): Payer: Self-pay | Admitting: Emergency Medicine

## 2012-03-20 ENCOUNTER — Encounter: Payer: Self-pay | Admitting: Family Medicine

## 2012-03-20 VITALS — BP 198/90 | HR 62 | Temp 98.0°F | Wt 189.0 lb

## 2012-03-20 DIAGNOSIS — R0602 Shortness of breath: Secondary | ICD-10-CM

## 2012-03-20 DIAGNOSIS — M549 Dorsalgia, unspecified: Secondary | ICD-10-CM

## 2012-03-20 DIAGNOSIS — J159 Unspecified bacterial pneumonia: Secondary | ICD-10-CM | POA: Insufficient documentation

## 2012-03-20 DIAGNOSIS — R42 Dizziness and giddiness: Secondary | ICD-10-CM

## 2012-03-20 DIAGNOSIS — Z7982 Long term (current) use of aspirin: Secondary | ICD-10-CM | POA: Insufficient documentation

## 2012-03-20 DIAGNOSIS — I10 Essential (primary) hypertension: Secondary | ICD-10-CM | POA: Insufficient documentation

## 2012-03-20 DIAGNOSIS — J189 Pneumonia, unspecified organism: Secondary | ICD-10-CM

## 2012-03-20 DIAGNOSIS — Z792 Long term (current) use of antibiotics: Secondary | ICD-10-CM | POA: Insufficient documentation

## 2012-03-20 DIAGNOSIS — Z8679 Personal history of other diseases of the circulatory system: Secondary | ICD-10-CM | POA: Insufficient documentation

## 2012-03-20 DIAGNOSIS — Z87448 Personal history of other diseases of urinary system: Secondary | ICD-10-CM | POA: Insufficient documentation

## 2012-03-20 DIAGNOSIS — IMO0002 Reserved for concepts with insufficient information to code with codable children: Secondary | ICD-10-CM | POA: Insufficient documentation

## 2012-03-20 DIAGNOSIS — Z87891 Personal history of nicotine dependence: Secondary | ICD-10-CM | POA: Insufficient documentation

## 2012-03-20 DIAGNOSIS — Z8669 Personal history of other diseases of the nervous system and sense organs: Secondary | ICD-10-CM | POA: Insufficient documentation

## 2012-03-20 DIAGNOSIS — M1710 Unilateral primary osteoarthritis, unspecified knee: Secondary | ICD-10-CM | POA: Insufficient documentation

## 2012-03-20 DIAGNOSIS — Z8711 Personal history of peptic ulcer disease: Secondary | ICD-10-CM | POA: Insufficient documentation

## 2012-03-20 DIAGNOSIS — Z9861 Coronary angioplasty status: Secondary | ICD-10-CM | POA: Insufficient documentation

## 2012-03-20 DIAGNOSIS — Z981 Arthrodesis status: Secondary | ICD-10-CM | POA: Insufficient documentation

## 2012-03-20 DIAGNOSIS — Z9889 Other specified postprocedural states: Secondary | ICD-10-CM | POA: Insufficient documentation

## 2012-03-20 DIAGNOSIS — D649 Anemia, unspecified: Secondary | ICD-10-CM | POA: Insufficient documentation

## 2012-03-20 DIAGNOSIS — Z8659 Personal history of other mental and behavioral disorders: Secondary | ICD-10-CM | POA: Insufficient documentation

## 2012-03-20 DIAGNOSIS — I252 Old myocardial infarction: Secondary | ICD-10-CM | POA: Insufficient documentation

## 2012-03-20 DIAGNOSIS — M545 Low back pain, unspecified: Secondary | ICD-10-CM | POA: Insufficient documentation

## 2012-03-20 DIAGNOSIS — Z8739 Personal history of other diseases of the musculoskeletal system and connective tissue: Secondary | ICD-10-CM | POA: Insufficient documentation

## 2012-03-20 DIAGNOSIS — I2510 Atherosclerotic heart disease of native coronary artery without angina pectoris: Secondary | ICD-10-CM | POA: Insufficient documentation

## 2012-03-20 DIAGNOSIS — Z85820 Personal history of malignant melanoma of skin: Secondary | ICD-10-CM | POA: Insufficient documentation

## 2012-03-20 DIAGNOSIS — E785 Hyperlipidemia, unspecified: Secondary | ICD-10-CM | POA: Insufficient documentation

## 2012-03-20 LAB — COMPREHENSIVE METABOLIC PANEL
Albumin: 3.6 g/dL (ref 3.5–5.2)
Alkaline Phosphatase: 124 U/L — ABNORMAL HIGH (ref 39–117)
BUN: 15 mg/dL (ref 6–23)
Calcium: 9.5 mg/dL (ref 8.4–10.5)
Creatinine, Ser: 0.96 mg/dL (ref 0.50–1.35)
GFR calc Af Amer: >90 mL/min (ref >90)
Glucose, Bld: 105 mg/dL — ABNORMAL HIGH (ref 70–99)
Potassium: 3.8 mEq/L (ref 3.5–5.1)
Total Protein: 7.2 g/dL (ref 6.0–8.3)

## 2012-03-20 LAB — CBC WITH DIFFERENTIAL/PLATELET
Basophils Relative: 0 % (ref 0–1)
Eosinophils Absolute: 0.1 10*3/uL (ref 0.0–0.7)
Eosinophils Relative: 1 % (ref 0–5)
Hemoglobin: 14.9 g/dL (ref 13.0–17.0)
Lymphs Abs: 1.9 10*3/uL (ref 0.7–4.0)
MCH: 32.4 pg (ref 26.0–34.0)
MCHC: 34.8 g/dL (ref 30.0–36.0)
MCV: 93 fL (ref 78.0–100.0)
Monocytes Absolute: 0.5 10*3/uL (ref 0.1–1.0)
Monocytes Relative: 4 % (ref 3–12)
Neutrophils Relative %: 77 % (ref 43–77)
RBC: 4.6 MIL/uL (ref 4.22–5.81)

## 2012-03-20 LAB — D-DIMER, QUANTITATIVE: D-Dimer, Quant: 1.59 ug/mL-FEU — ABNORMAL HIGH (ref 0.00–0.48)

## 2012-03-20 LAB — TROPONIN I: Troponin I: <0.3 ng/mL (ref <0.30)

## 2012-03-20 MED ORDER — MOXIFLOXACIN HCL 400 MG PO TABS: 400.0000 mg | ORAL_TABLET | Freq: Every day | ORAL | Status: DC

## 2012-03-20 MED ORDER — LEVOFLOXACIN IN D5W 750 MG/150ML IV SOLN
750.0000 mg | INTRAVENOUS | Status: DC
  Administered 2012-03-20: 750 mg via INTRAVENOUS
  Filled 2012-03-20: qty 150

## 2012-03-20 MED ORDER — BACLOFEN 10 MG PO TABS
10.0000 mg | ORAL_TABLET | Freq: Once | ORAL | Status: AC
  Administered 2012-03-20: 10 mg via ORAL
  Filled 2012-03-20: qty 1

## 2012-03-20 MED ORDER — MORPHINE SULFATE 4 MG/ML IJ SOLN
6.0000 mg | Freq: Once | INTRAMUSCULAR | Status: AC
  Administered 2012-03-20: 6 mg via INTRAVENOUS
  Filled 2012-03-20: qty 2

## 2012-03-20 MED ORDER — IOHEXOL 350 MG/ML SOLN
80.0000 mL | Freq: Once | INTRAVENOUS | Status: AC | PRN
Start: 2012-03-20 — End: 2012-03-20
  Administered 2012-03-20: 80 mL via INTRAVENOUS

## 2012-03-20 MED ORDER — MORPHINE SULFATE 4 MG/ML IJ SOLN
8.0000 mg | Freq: Once | INTRAMUSCULAR | Status: AC
  Administered 2012-03-20: 8 mg via INTRAVENOUS
  Filled 2012-03-20: qty 2

## 2012-03-20 MED ORDER — BACLOFEN 10 MG PO TABS: 10.0000 mg | ORAL_TABLET | Freq: Three times a day (TID) | ORAL | Status: DC

## 2012-03-20 NOTE — ED Notes (Addendum)
Pt brought to ED via [facility] from [facility] for further evaluation of hypertension, back pain, and shortness of breath.  Pt has been taking Baclofen for spasms in back that has been causing shortness of breath and dizziness - pt went to PCP today and they were concerned due to pt history and called EMS to transfer to ED - PCP administered 324 ASA and 1 nitro, pt denies having chest pain today.  Pt denies shortness of breath or dizziness upon arrival to ED.  EMS gave 150 mcg of Fentanyl.  Placed on cardiac monitor, hx of a-fib, pt in a-fib at present.  NAD at this time.

## 2012-03-20 NOTE — Telephone Encounter (Signed)
Patient Information:  Caller Name: Chrles  Phone: [phone]  Patient: [name], [name]  Gender: Male  DOB: Oct 02, 1940  Age: 72 Years  PCP: Darryll Capers (Adults only)   Does the office need to follow up with this patient?: Yes  RN Note:  worse shortness of breath when resting, no appointment available with Dr Lovell Sheehan or Lubertha Sayres.   Reason For Call & Symptoms: shortness of breath , worse after using Morphine, using for back pain  Reviewed Health History In EMR: Yes  Reviewed Medications In EMR: Yes  Reviewed Allergies In EMR: Yes  Reviewed Surgeries / Procedures: Yes  Date of Onset of Symptoms: 02/24/2012  Guideline(s) Used:  Breathing Difficulty  Disposition Per Guideline:  Go to Office Now  Reason For Disposition Reached:  Mild difficulty breathing (e.g., minimal/no SOB at rest, SOB with walking, pulse < 100) of new onset or worse than normal  Advice Given:  Call Back If:  Severe difficulty breathing occurs;  You become worse.

## 2012-03-20 NOTE — Progress Notes (Signed)
Chief Complaint  Patient presents with  . Shortness of Breath    dizziness; pt thinks his muslce relaxer is causing this    HPI:  Acute visit for SOB: -Hx of MMP here for SOB and dizziness, also have sweats and panicked feeling - on and off for a few weeks but worsening the last few days, on and off -PCP not available to see him today -currently feels panicked, pr-syncopal and "hot flash" breaking out into cold sweat -can occur at anytime - usually at rest - SOB, orthopnea, dizziness, "rushing" sensation in chest at times -denies: CP, swelling, jaw or arm pain, syncope, cough, wheezing, CP, palpitations, weight changes -had labs recently with CBC that looked good -taking morphine and muscle relaxers for back pain prescribed by PCP and feels like these may be contributing -pt is worried about his heart - has hx MI and stenting in 1998, A. Fib, HTN, HLD - takes asa and zebeta, lisinopril, simvastin -see Dr. Pearletha Forge with [facility] cardiology - but hasn't seen him in a long time and wants to switch to [facility]  ROS: See pertinent positives and negatives per HPI.  Past Medical History  Diagnosis Date  . Hypertension   . Hyperlipidemia   . Preoperative cardiovascular examination   . Spinal stenosis, lumbar   . CAS (cerebral atherosclerosis)   . Erectile dysfunction   . Preventative health care   . Benign prostatic hypertrophy with urinary obstruction   . Family history of early CAD     male 1st degree relative <50  . Peptic ulcer disease   . Low back pain   . Superficial spreading melanoma   . Sleep apnea, obstructive     CPAP-does not use  . HA (headache)     sinus headaches  . Arthritis     knees  . Myocardial infarction 1997    Hx MI 1997 and 1998  . Anxiety   . Pneumonia 2005  . Anemia   . Depression     takes Zoloft  . Neuropathy, autonomic, idiopathic peripheral, other   . Blood transfusion 1 yr. ago    jerking,fever-after blood transfusion  . Blood  transfusion     given wrong blood  . Coronary artery disease 1998    cardiac stent/ Clearance Dr Lovell Sheehan and Jacinto Halim with note on chart  . Dysrhythmia     hx of atrial fib per ov note of 12/12   . Bradycardia     hx of bradycardia in past per ov note of 12/12    No family history on file.  History   Social History  . Marital Status: Married    Spouse Name: N/A    Number of Children: N/A  . Years of Education: N/A   Occupational History  . diesel repair     part time  . worked at [employer] History Main Topics  . Smoking status: Former Smoker -- 1.0 packs/day for 15 years    Types: Cigars    Quit date: 12/23/2009  . Smokeless tobacco: Never Used     Comment: smoked a pipe for 6-7 y ears  . Alcohol Use: No  . Drug Use: No  . Sexually Active: None   Other Topics Concern  . None   Social History Narrative  . None    Current outpatient prescriptions:aspirin EC 81 MG tablet, Take 81 mg by mouth daily., Disp: , Rfl: ;  Azelastine-Fluticasone (DYMISTA) 137-50 MCG/ACT SUSP, Place into the nose  2 (two) times daily., Disp: , Rfl: ;  baclofen (LIORESAL) 10 MG tablet, Take 1 tablet (10 mg total) by mouth 3 (three) times daily as needed., Disp: 90 each, Rfl: 3;  bisoprolol (ZEBETA) 5 MG tablet, Take 2.5 mg by mouth daily after breakfast. , Disp: , Rfl:  Chlorphen-Phenyleph-ASA (ALKA-SELTZER PLUS COLD PO), Take 1 tablet by mouth daily as needed. For indigestion, Disp: , Rfl: ;  ciprofloxacin (CIPRO) 500 MG tablet, Take 500 mg by mouth 2 (two) times daily., Disp: , Rfl: ;  ferrous sulfate (FEOSOL) 325 (65 FE) MG tablet, Take 325 mg by mouth daily with breakfast., Disp: , Rfl: ;  gabapentin (NEURONTIN) 400 MG capsule, Take 400 mg by mouth 3 (three) times daily., Disp: , Rfl:  lisinopril (PRINIVIL,ZESTRIL) 20 MG tablet, Take 10 mg by mouth daily., Disp: , Rfl: ;  morphine (MS CONTIN) 30 MG 12 hr tablet, Take 1 tablet (30 mg total) by mouth 3 (three) times daily., Disp: 90 tablet, Rfl: 0;   morphine (MSIR) 15 MG tablet, Take 1 tablet (15 mg total) by mouth every 8 (eight) hours as needed. For breakthrough pain. For pain, Disp: 30 tablet, Rfl: 0 omeprazole (PRILOSEC) 40 MG capsule, Take 1 capsule (40 mg total) by mouth daily., Disp: 30 capsule, Rfl: 3;  polyethylene glycol (MIRALAX / GLYCOLAX) packet, Take 17 g by mouth daily., Disp: 14 each, Rfl: 0;  simvastatin (ZOCOR) 40 MG tablet, Take 40 mg by mouth at bedtime., Disp: , Rfl: ;  [DISCONTINUED] zolpidem (AMBIEN) 5 MG tablet, Take 1 tablet (5 mg total) by mouth at bedtime as needed for sleep., Disp: 20 tablet, Rfl: 0  EXAM:  Filed Vitals:   03/20/12 1418  BP: 198/90  Pulse: 62  Temp: 98 F (36.7 C)    There is no height on file to calculate BMI.  GENERAL: vitals reviewed and listed above, alert, oriented, appears well hydrated and in no acute distress  HEENT: atraumatic, conjunttiva clear, no obvious abnormalities on inspection of external nose and ears  NECK: no obvious masses on inspection, no JVD or bruit appcrreciated  LUNGS: clear to auscultation bilaterally, no wheezes, rales or rhonchi, good air movement  CV: irr  irr, tr bilat pitting edema  MS: moves all extremities without noticeable abnormality  PSYCH: pleasant and cooperative, no obvious depression or anxiety  ASSESSMENT AND PLAN:  Discussed the following assessment and plan:  1. SOB (shortness of breath)  EKG 12-Lead  2. Dizziness  EKG 12-Lead  3. Hypertension     -72 yo M with PMH CAD s/p stenting in 1988, A. Fib only on baby ASA, HTN, HLD here today with panicked sensation, diaphoresis, dizziness and SOB on and off for several days. Presyncopal here.  -BP is 190s/90s and EKG shows A. Fib with with nonspecific ST changes compared to EKG from 08/2011. He took his BP medications today. -pt very concerned about his heart - advised evaluation in the ED for tx of HTN and repeat EKG, cardiac enzymes, eval for presyncope -asa, o2 and ntg given - panic  sensation and sweating improved -EMS called, ED notified -Patient advised to return or notify a doctor immediately if symptoms worsen or persist or new concerns arise.  There are no Patient Instructions on file for this visit.   Kriste Basque R.

## 2012-03-20 NOTE — Telephone Encounter (Signed)
Spoke with patient and an appointment made with Dr Selena Batten this afternoon

## 2012-03-20 NOTE — ED Provider Notes (Signed)
History     CSN: [id number]  Arrival date & time 03/20/12  1556   First MD Initiated Contact with Patient 03/20/12 1608      Chief Complaint  Patient presents with  . Back Pain    (Consider location/radiation/quality/duration/timing/severity/associated sxs/prior treatment) Patient is a 72 y.o. male presenting with back pain. The history is provided by the patient (the pt complains of back pain). No language interpreter was used.  Back Pain  This is a chronic problem. The current episode started 1 to 2 hours ago. The problem occurs constantly. The problem has not changed since onset.The pain is associated with no known injury. The pain is present in the lumbar spine. The quality of the pain is described as stabbing. The pain does not radiate. The pain is at a severity of 3/10. The pain is moderate. The symptoms are aggravated by twisting. Pertinent negatives include no chest pain, no headaches and no abdominal pain.    Past Medical History  Diagnosis Date  . Hypertension   . Hyperlipidemia   . Preoperative cardiovascular examination   . Spinal stenosis, lumbar   . CAS (cerebral atherosclerosis)   . Erectile dysfunction   . Preventative health care   . Benign prostatic hypertrophy with urinary obstruction   . Family history of early CAD     male 1st degree relative <50  . Peptic ulcer disease   . Low back pain   . Superficial spreading melanoma   . Sleep apnea, obstructive     CPAP-does not use  . HA (headache)     sinus headaches  . Arthritis     knees  . Myocardial infarction 1997    Hx MI 1997 and 1998  . Anxiety   . Pneumonia 2005  . Anemia   . Depression     takes Zoloft  . Neuropathy, autonomic, idiopathic peripheral, other   . Blood transfusion 1 yr. ago    jerking,fever-after blood transfusion  . Blood transfusion     given wrong blood  . Coronary artery disease 1998    cardiac stent/ Clearance Dr Lovell Sheehan and Jacinto Halim with note on chart  . Dysrhythmia     hx  of atrial fib per ov note of 12/12   . Bradycardia     hx of bradycardia in past per ov note of 12/12    Past Surgical History  Procedure Date  . Esophagogastroduodenoscopy 2004  . Colonoscopy 2004  . Lumbar laminectomy   . Lumbar fusion   . Knee arthroscopy     left  . Shoulder arthroscopy     right  . Knee arthroscopy     right  . Decompression of the median nerve     left wrist and hand  . Revision of tkr 2011    on left  . Coronary stent placement   . Back surgery 2005-last    lumbar x2  . Excisional total knee arthroplasty 12/25/2010    Procedure: EXCISIONAL TOTAL KNEE ARTHROPLASTY;  Surgeon: Shelda Pal;  Location: [facility];  Service: Orthopedics;  Laterality: Right;  repeat irrigation   and debridement, removal and reinsertation of spacer block  . Joint replacement 2011    left knee x 2 ; 08/2010-right knee-infected  . I&d extremity 03/25/2011    Procedure: IRRIGATION AND DEBRIDEMENT EXTREMITY;  Surgeon: Shelda Pal, MD;  Location: [facility];  Service: Orthopedics;  Laterality: Right;  . Coronary angioplasty 1998    stents 1998   . Total  knee revision 05/13/2011    Procedure: TOTAL KNEE REVISION;  Surgeon: Shelda Pal, MD;  Location: [facility];  Service: Orthopedics;  Laterality: Right;  Reimplantation    No family history on file.  History  Substance Use Topics  . Smoking status: Former Smoker -- 1.0 packs/day for 15 years    Types: Cigars    Quit date: 12/23/2009  . Smokeless tobacco: Never Used     Comment: smoked a pipe for 6-7 y ears  . Alcohol Use: No      Review of Systems  Constitutional: Negative for fatigue.  HENT: Negative for congestion, sinus pressure and ear discharge.   Eyes: Negative for discharge.  Respiratory: Negative for cough.   Cardiovascular: Negative for chest pain.  Gastrointestinal: Negative for abdominal pain and diarrhea.  Genitourinary: Negative for frequency and hematuria.  Musculoskeletal: Positive for back pain.  Skin:  Negative for rash.  Neurological: Negative for seizures and headaches.  Hematological: Negative.   Psychiatric/Behavioral: Negative for hallucinations.    Allergies  Acetaminophen  Home Medications   Current Outpatient Rx  Name  Route  Sig  Dispense  Refill  . ASPIRIN EC 81 MG PO TBEC   Oral   Take 81 mg by mouth daily.         . AZELASTINE-FLUTICASONE 137-50 MCG/ACT NA SUSP   Nasal   Place into the nose 2 (two) times daily.         Marland Kitchen BACLOFEN 10 MG PO TABS   Oral   Take 1 tablet (10 mg total) by mouth 3 (three) times daily as needed.   90 each   3   . BISOPROLOL FUMARATE 5 MG PO TABS   Oral   Take 2.5 mg by mouth daily after breakfast.          . ALKA-SELTZER PLUS COLD PO   Oral   Take 1 tablet by mouth daily as needed. For indigestion         . FERROUS SULFATE 325 (65 FE) MG PO TABS   Oral   Take 325 mg by mouth daily with breakfast.         . GABAPENTIN 400 MG PO CAPS   Oral   Take 400 mg by mouth 3 (three) times daily.         Marland Kitchen LISINOPRIL 20 MG PO TABS   Oral   Take 10 mg by mouth daily.         . MORPHINE SULFATE ER 30 MG PO TBCR   Oral   Take 1 tablet (30 mg total) by mouth 3 (three) times daily.   90 tablet   0   . OMEGA-3-ACID ETHYL ESTERS 1 G PO CAPS   Oral   Take 1 g by mouth daily.         Marland Kitchen POLYETHYLENE GLYCOL 3350 PO PACK   Oral   Take 17 g by mouth daily.   14 each   0   . SIMVASTATIN 40 MG PO TABS   Oral   Take 40 mg by mouth at bedtime.         Marland Kitchen MOXIFLOXACIN HCL 400 MG PO TABS   Oral   Take 1 tablet (400 mg total) by mouth daily.   7 tablet   0     BP 167/110  Pulse 69  Temp 98.2 F (36.8 C)  Resp 16  SpO2 97%  Physical Exam  Constitutional: He is oriented to person, place, and time. He  appears well-developed.  HENT:  Head: Normocephalic and atraumatic.  Eyes: Conjunctivae normal and EOM are normal. No scleral icterus.  Neck: Neck supple. No thyromegaly present.  Cardiovascular: Normal rate and  regular rhythm.  Exam reveals no gallop and no friction rub.   No murmur heard. Pulmonary/Chest: No stridor. He has no wheezes. He has no rales. He exhibits no tenderness.  Abdominal: He exhibits no distension. There is no tenderness. There is no rebound.  Musculoskeletal: Normal range of motion. He exhibits no edema.       Tender thoracic and lumbar spine  Lymphadenopathy:    He has no cervical adenopathy.  Neurological: He is oriented to person, place, and time. Coordination normal.  Skin: No rash noted. No erythema.  Psychiatric: He has a normal mood and affect. His behavior is normal.    ED Course  Procedures (including critical care time)  Labs Reviewed  CBC WITH DIFFERENTIAL - Abnormal; Notable for the following:    WBC 10.6 (*)     Neutro Abs 8.1 (*)     All other components within normal limits  COMPREHENSIVE METABOLIC PANEL - Abnormal; Notable for the following:    Glucose, Bld 105 (*)     Alkaline Phosphatase 124 (*)     GFR calc non Af Amer 80 (*)     All other components within normal limits  D-DIMER, QUANTITATIVE - Abnormal; Notable for the following:    D-Dimer, Quant 1.59 (*)     All other components within normal limits  TROPONIN I   Ct Angio Chest Pe W/cm &/or Wo Cm  03/20/2012  *RADIOLOGY REPORT*  Clinical Data: Shortness of breath, hypertension, back pain, evaluate for pulmonary embolism  CT ANGIOGRAPHY CHEST  Technique:  Multidetector CT imaging of the chest using the standard protocol during bolus administration of intravenous contrast. Multiplanar reconstructed images including MIPs were obtained and reviewed to evaluate the vascular anatomy.  Contrast: 80mL OMNIPAQUE IOHEXOL 350 MG/ML SOLN  Comparison: Chest radiograph - earlier same day; CT abdomen pelvis - 02/10/2012  Vascular Findings:  There is adequate opacification of the pulmonary arterial tree with a pulmonary artery measuring 387 HU.  There are no discrete filling defects within the pulmonary arterial  tree to suggest pulmonary embolism.  There is enlargement the caliber of the main pulmonary artery measuring approximately 3.8 cm in diameter.  Normal heart size.  Extensive coronary artery calcifications.  No pericardial effusion.  Scattered atherosclerotic plaque within a mildly tortuous but normal caliber thoracic aorta.  No definite periaortic stranding.  --------------------------------------------------  Nonvascular findings:  Interval development of ill-defined slightly nodular peripheral heterogeneous air space opacities within the dependent portion of the right lower lobe.  There is minimal geographic ground-glass within the medial basilar segment of the right lower lobe adjacent to exuberant thoracic spine syndesmophytes favored to represent atelectasis.  No pleural effusion or pneumothorax.  The central pulmonary airways are patent.  Scattered shoddy mediastinal lymph nodes are not enlarged by CT criteria.  No mediastinal, hilar or axillary lymphadenopathy.  Limited visualization of the upper abdomen is normal.  No acute or aggressive osseous abnormalities.  Multilevel thoracic spine degenerative change.  IMPRESSION:  1.  Negative for pulmonary embolism. 2.  Interval development of minimal heterogeneous slightly nodular air space opacities within the right lower lobe, possibly atelectasis though developing infection not excluded.  3.  Enlarged caliber of the main pulmonary artery, nonspecific but may be seen in the setting of pulmonary arterial hypertension. Further evaluation with cardiac  echo may be performed as clinically indicated. 4.  Coronary artery calcifications.  5. Multilevel thoracic spine degenerative change.   Original Report Authenticated By: Tacey Ruiz, MD    Dg Chest Port 1 View  03/20/2012  *RADIOLOGY REPORT*  Clinical Data: Shortness of breath, dizziness  PORTABLE CHEST - 1 VIEW  Comparison: 09/09/2011  Findings: Lungs are essentially clear. No pleural effusion or pneumothorax.   Mild cardiomegaly.  Degenerative changes of the visualized thoracolumbar spine and bilateral shoulders.  IMPRESSION: No evidence of acute cardiopulmonary disease.   Original Report Authenticated By: Charline Bills, M.D.      1. Back pain   2. Community acquired pneumonia       MDM          Benny Lennert, MD 03/20/12 2038

## 2012-03-20 NOTE — ED Notes (Signed)
Patient transported to CT 

## 2012-03-23 ENCOUNTER — Telehealth: Payer: Self-pay | Admitting: Internal Medicine

## 2012-03-23 NOTE — Telephone Encounter (Signed)
Patient Information:  Caller Name: [place]  Phone: [phone]  Patient: [name], [name]  Gender: Male  DOB: 1940/06/29  Age: 72 Years  PCP: Darryll Capers (Adults only)  Office Follow Up:  Does the office need to follow up with this patient?: No  Instructions For The Office: N/A   Symptoms  Reason For Call & Symptoms: Lower back pain onset unknown.  States pain meds have stopped working.  Pain rated at 10 of 10 with 7 of 10 constant.  Seen in ED 03/20/12 for back pain.  Left leg numbness, on going.  Emergent symptoms ruled out.  Patient requests to be seen.  Home care for the interim.  Reviewed Health History In EMR: Yes  Reviewed Medications In EMR: Yes  Reviewed Allergies In EMR: Yes  Reviewed Surgeries / Procedures: Yes  Date of Onset of Symptoms: Unknown  Treatments Tried: Morphine slow release, Gabapentin.  Treatments Tried Worked: No  Guideline(s) Used:  Back Pain  Disposition Per Guideline:   See Today or Tomorrow in Office  Reason For Disposition Reached:   Patient wants to be seen  Advice Given:  Cold or Heat:  Heat Pack: If pain lasts over 2 days, apply heat to the sore area. Use a heat pack, heating pad, or warm wet washcloth. Do this for 10 minutes, then as needed. For widespread stiffness, take a hot bath or hot shower instead. Move the sore area under the warm water.  Sleep:  Sleep on your side with a pillow between your knees. If you sleep on your back, put a pillow under your knees.  Avoid sleeping on your stomach.  Activity  Keep doing your day-to-day activities if it is not too painful. Staying active is better than resting.  Avoid anything that makes your pain worse. Avoid heavy lifting, twisting, and too much exercise until your back heals.  You do not need to stay in bed.  Pain Medicines:  Use the lowest amount of medicine that makes your pain feel better.  Call Back If:  You have more questions  You become worse.  Appointment Scheduled:  03/24/2012 11:15:00 Appointment Scheduled Provider:  Adline Mango [facility])

## 2012-03-24 ENCOUNTER — Ambulatory Visit (INDEPENDENT_AMBULATORY_CARE_PROVIDER_SITE_OTHER): Payer: Medicare Other | Admitting: Family

## 2012-03-24 ENCOUNTER — Encounter: Payer: Self-pay | Admitting: Family

## 2012-03-24 ENCOUNTER — Telehealth: Payer: Self-pay | Admitting: Family

## 2012-03-24 VITALS — BP 144/90 | HR 60 | Temp 98.0°F | Wt 181.0 lb

## 2012-03-24 DIAGNOSIS — R350 Frequency of micturition: Secondary | ICD-10-CM

## 2012-03-24 DIAGNOSIS — G8929 Other chronic pain: Secondary | ICD-10-CM

## 2012-03-24 DIAGNOSIS — M549 Dorsalgia, unspecified: Secondary | ICD-10-CM

## 2012-03-24 DIAGNOSIS — J189 Pneumonia, unspecified organism: Secondary | ICD-10-CM

## 2012-03-24 DIAGNOSIS — K219 Gastro-esophageal reflux disease without esophagitis: Secondary | ICD-10-CM

## 2012-03-24 MED ORDER — METHYLPREDNISOLONE 4 MG PO KIT: PACK | ORAL | Status: AC

## 2012-03-24 MED ORDER — OMEPRAZOLE 20 MG PO CPDR: 20.0000 mg | DELAYED_RELEASE_CAPSULE | Freq: Every day | ORAL | Status: DC

## 2012-03-24 NOTE — Progress Notes (Signed)
Subjective:    Patient ID: [name], male    DOB: [birth year], 72 y.o.   MRN: [id number]  HPI And 72 year old white male, nonsmoker, patient of Dr. Lovell Sheehan is in as an emergency department followup. He was seen in the emergency department 3 days ago and diagnosed with pneumonia. His initial complaint was worsening chronic back pain. He had an x-ray done and incidentally found pneumonia. He's currently on Avelox and tolerating it well. He has 4 more days of the medication. Today, he reports burning in his chest and congestion. Back pain has improved but is still 4/10 despite taken morphine, baclofen, Neurontin. He sees orthopedics  Who is considering I nerve stimulator.  Today, patient is complaining of heartburn and indigestion times one day. He took TUMS just prior to this office visit. In the past, has been on omeprazole but has not taken the medication in quite some time. Denies any nausea, vomiting, diarrhea, constipation, blood in the stools are dark black stools. Also reports urinary frequency and hesitancy. Reports a recent prostate check with PCP that was normal. Is not sure when his last PSA was. No burning with urination, blood in his urine, foul-smelling urine.  Review of Systems  Constitutional: Negative.   HENT: Negative.   Eyes: Negative.   Respiratory: Positive for cough and shortness of breath.   Cardiovascular: Negative.   Gastrointestinal: Positive for abdominal pain. Negative for nausea, diarrhea, constipation and anal bleeding.  Musculoskeletal: Positive for back pain.  Skin: Negative.   Neurological: Negative.   Hematological: Negative.   Psychiatric/Behavioral: Negative.    Past Medical History  Diagnosis Date  . Hypertension   . Hyperlipidemia   . Preoperative cardiovascular examination   . Spinal stenosis, lumbar   . CAS (cerebral atherosclerosis)   . Erectile dysfunction   . Preventative health care   . Benign prostatic hypertrophy with urinary  obstruction   . Family history of early CAD     male 1st degree relative <50  . Peptic ulcer disease   . Low back pain   . Superficial spreading melanoma   . Sleep apnea, obstructive     CPAP-does not use  . HA (headache)     sinus headaches  . Arthritis     knees  . Myocardial infarction 1997    Hx MI 1997 and 1998  . Anxiety   . Pneumonia 2005  . Anemia   . Depression     takes Zoloft  . Neuropathy, autonomic, idiopathic peripheral, other   . Blood transfusion 1 yr. ago    jerking,fever-after blood transfusion  . Blood transfusion     given wrong blood  . Coronary artery disease 1998    cardiac stent/ Clearance Dr Lovell Sheehan and Jacinto Halim with note on chart  . Dysrhythmia     hx of atrial fib per ov note of 12/12   . Bradycardia     hx of bradycardia in past per ov note of 12/12    History   Social History  . Marital Status: Married    Spouse Name: N/A    Number of Children: N/A  . Years of Education: N/A   Occupational History  . diesel repair     part time  . worked at [employer] History Main Topics  . Smoking status: Former Smoker -- 1.0 packs/day for 15 years    Types: Cigars    Quit date: 12/23/2009  . Smokeless tobacco: Never Used  Comment: smoked a pipe for 6-7 y ears  . Alcohol Use: No  . Drug Use: No  . Sexually Active: Not on file   Other Topics Concern  . Not on file   Social History Narrative  . No narrative on file    Past Surgical History  Procedure Date  . Esophagogastroduodenoscopy 2004  . Colonoscopy 2004  . Lumbar laminectomy   . Lumbar fusion   . Knee arthroscopy     left  . Shoulder arthroscopy     right  . Knee arthroscopy     right  . Decompression of the median nerve     left wrist and hand  . Revision of tkr 2011    on left  . Coronary stent placement   . Back surgery 2005-last    lumbar x2  . Excisional total knee arthroplasty 12/25/2010    Procedure: EXCISIONAL TOTAL KNEE ARTHROPLASTY;  Surgeon: Shelda Pal;  Location: [facility];  Service: Orthopedics;  Laterality: Right;  repeat irrigation   and debridement, removal and reinsertation of spacer block  . Joint replacement 2011    left knee x 2 ; 08/2010-right knee-infected  . I&d extremity 03/25/2011    Procedure: IRRIGATION AND DEBRIDEMENT EXTREMITY;  Surgeon: Shelda Pal, MD;  Location: [facility];  Service: Orthopedics;  Laterality: Right;  . Coronary angioplasty 1998    stents 1998   . Total knee revision 05/13/2011    Procedure: TOTAL KNEE REVISION;  Surgeon: Shelda Pal, MD;  Location: [facility];  Service: Orthopedics;  Laterality: Right;  Reimplantation    No family history on file.  Allergies  Allergen Reactions  . Acetaminophen Other (See Comments)    LIVER INFLAMMATION IN HIGH DOSES    Current Outpatient Prescriptions on File Prior to Visit  Medication Sig Dispense Refill  . aspirin EC 81 MG tablet Take 81 mg by mouth daily.      . Azelastine-Fluticasone (DYMISTA) 137-50 MCG/ACT SUSP Place into the nose 2 (two) times daily.      . baclofen (LIORESAL) 10 MG tablet Take 1 tablet (10 mg total) by mouth 3 (three) times daily as needed.  90 each  3  . bisoprolol (ZEBETA) 5 MG tablet Take 2.5 mg by mouth daily after breakfast.       . Chlorphen-Phenyleph-ASA (ALKA-SELTZER PLUS COLD PO) Take 1 tablet by mouth daily as needed. For indigestion      . gabapentin (NEURONTIN) 400 MG capsule Take 400 mg by mouth 3 (three) times daily.      Marland Kitchen lisinopril (PRINIVIL,ZESTRIL) 20 MG tablet Take 10 mg by mouth daily.      Marland Kitchen morphine (MS CONTIN) 30 MG 12 hr tablet Take 1 tablet (30 mg total) by mouth 3 (three) times daily.  90 tablet  0  . moxifloxacin (AVELOX) 400 MG tablet Take 1 tablet (400 mg total) by mouth daily.  7 tablet  0  . omeprazole (PRILOSEC OTC) 20 MG tablet Take 20 mg by mouth daily.      . polyethylene glycol (MIRALAX / GLYCOLAX) packet Take 17 g by mouth daily.  14 each  0  . simvastatin (ZOCOR) 40 MG tablet Take 40 mg by mouth at  bedtime.      . [DISCONTINUED] zolpidem (AMBIEN) 5 MG tablet Take 1 tablet (5 mg total) by mouth at bedtime as needed for sleep.  20 tablet  0    BP 144/90  Pulse 60  Temp 98 F (36.7 C) (  Oral)  Wt 181 lb (82.101 kg)chart    Objective:   Physical Exam  Constitutional: He is oriented to person, place, and time. He appears well-developed and well-nourished.  HENT:  Right Ear: External ear normal.  Left Ear: External ear normal.  Nose: Nose normal.  Mouth/Throat: Oropharynx is clear and moist.  Neck: Normal range of motion. Neck supple.  Cardiovascular: Normal rate, regular rhythm and normal heart sounds.   Pulmonary/Chest: Effort normal and breath sounds normal.  Abdominal: Soft. Bowel sounds are normal. There is tenderness.  Musculoskeletal: Normal range of motion.  Neurological: He is alert and oriented to person, place, and time.  Skin: Skin is warm and dry.  Psychiatric: He has a normal mood and affect.          Assessment & Plan:  Assessment:  1. Community-acquired pneumonia 2. Chronic back pain 3. GERD 4. Urinary frequency  Plan: Omeprazole 20 mg once daily. Medrol Dosepak as directed. Continue current pain medication a muscle relaxer. PSA sent for frequency issue. Encouraged a GERD friendly diet. Patient advised to call the office symptoms worsen or persist. Recheck with PCP as scheduled, and as needed.

## 2012-03-24 NOTE — Patient Instructions (Addendum)
Pneumonia, Adult °Pneumonia is an infection of the lungs.  °CAUSES °Pneumonia may be caused by bacteria or a virus. Usually, these infections are caused by breathing infectious particles into the lungs (respiratory tract). °SYMPTOMS  °· Cough. °· Fever. °· Chest pain. °· Increased rate of breathing. °· Wheezing. °· Mucus production. °DIAGNOSIS  °If you have the common symptoms of pneumonia, your caregiver will typically confirm the diagnosis with a chest X-ray. The X-ray will show an abnormality in the lung (pulmonary infiltrate) if you have pneumonia. Other tests of your blood, urine, or sputum may be done to find the specific cause of your pneumonia. Your caregiver may also do tests (blood gases or pulse oximetry) to see how well your lungs are working. °TREATMENT  °Some forms of pneumonia may be spread to other people when you cough or sneeze. You may be asked to wear a mask before and during your exam. Pneumonia that is caused by bacteria is treated with antibiotic medicine. Pneumonia that is caused by the influenza virus may be treated with an antiviral medicine. Most other viral infections must run their course. These infections will not respond to antibiotics.  °PREVENTION °A pneumococcal shot (vaccine) is available to prevent a common bacterial cause of pneumonia. This is usually suggested for: °· People over 65 years old. °· Patients on chemotherapy. °· People with chronic lung problems, such as bronchitis or emphysema. °· People with immune system problems. °If you are over 65 or have a high risk condition, you may receive the pneumococcal vaccine if you have not received it before. In some countries, a routine influenza vaccine is also recommended. This vaccine can help prevent some cases of pneumonia. You may be offered the influenza vaccine as part of your care. °If you smoke, it is time to quit. You may receive instructions on how to stop smoking. Your caregiver can provide medicines and counseling to  help you quit. °HOME CARE INSTRUCTIONS  °· Cough suppressants may be used if you are losing too much rest. However, coughing protects you by clearing your lungs. You should avoid using cough suppressants if you can. °· Your caregiver may have prescribed medicine if he or she thinks your pneumonia is caused by a bacteria or influenza. Finish your medicine even if you start to feel better. °· Your caregiver may also prescribe an expectorant. This loosens the mucus to be coughed up. °· Only take over-the-counter or prescription medicines for pain, discomfort, or fever as directed by your caregiver. °· Do not smoke. Smoking is a common cause of bronchitis and can contribute to pneumonia. If you are a smoker and continue to smoke, your cough may last several weeks after your pneumonia has cleared. °· A cold steam vaporizer or humidifier in your room or home may help loosen mucus. °· Coughing is often worse at night. Sleeping in a semi-upright position in a recliner or using a couple pillows under your head will help with this. °· Get rest as you feel it is needed. Your body will usually let you know when you need to rest. °SEEK IMMEDIATE MEDICAL CARE IF:  °· Your illness becomes worse. This is especially true if you are elderly or weakened from any other disease. °· You cannot control your cough with suppressants and are losing sleep. °· You begin coughing up blood. °· You develop pain which is getting worse or is uncontrolled with medicines. °· You have a fever. °· Any of the symptoms which initially brought you in for treatment   are getting worse rather than better.  You develop shortness of breath or chest pain. MAKE SURE YOU:   Understand these instructions.  Will watch your condition.  Will get help right away if you are not doing well or get worse. Document Released: 02/04/2005 Document Revised: 04/29/2011 Document Reviewed: 04/26/2010 [facility] Patient Information 2013 [place], [place].   Diet for  Gastroesophageal Reflux Disease, Adult Reflux (acid reflux) is when acid from your stomach flows up into the esophagus. When acid comes in contact with the esophagus, the acid causes irritation and soreness (inflammation) in the esophagus. When reflux happens often or so severely that it causes damage to the esophagus, it is called gastroesophageal reflux disease (GERD). Nutrition therapy can help ease the discomfort of GERD. FOODS OR DRINKS TO AVOID OR LIMIT  Smoking or chewing tobacco. Nicotine is one of the most potent stimulants to acid production in the gastrointestinal tract.  Caffeinated and decaffeinated coffee and black tea.  Regular or low-calorie carbonated beverages or energy drinks (caffeine-free carbonated beverages are allowed).   Strong spices, such as black pepper, white pepper, red pepper, cayenne, curry powder, and chili powder.  Peppermint or spearmint.  Chocolate.  High-fat foods, including meats and fried foods. Extra added fats including oils, butter, salad dressings, and nuts. Limit these to less than 8 tsp per day.  Fruits and vegetables if they are not tolerated, such as citrus fruits or tomatoes.  Alcohol.  Any food that seems to aggravate your condition. If you have questions regarding your diet, call your caregiver or a registered dietitian. OTHER THINGS THAT MAY HELP GERD INCLUDE:   Eating your meals slowly, in a relaxed setting.  Eating 5 to 6 small meals per day instead of 3 large meals.  Eliminating food for a period of time if it causes distress.  Not lying down until 3 hours after eating a meal.  Keeping the head of your bed raised 6 to 9 inches (15 to 23 cm) by using a foam wedge or blocks under the legs of the bed. Lying flat may make symptoms worse.  Being physically active. Weight loss may be helpful in reducing reflux in overweight or obese adults.  Wear loose fitting clothing EXAMPLE MEAL PLAN This meal plan is approximately 2,000  calories based on https://www.bernard.org/ meal planning guidelines. Breakfast   cup cooked oatmeal.  1 cup strawberries.  1 cup low-fat milk.  1 oz almonds. Snack  1 cup cucumber slices.  6 oz yogurt (made from low-fat or fat-free milk). Lunch  2 slice whole-wheat bread.  2 oz sliced [place].  2 tsp mayonnaise.  1 cup blueberries.  1 cup snap peas. Snack  6 whole-wheat crackers.  1 oz string cheese. Dinner   cup brown rice.  1 cup mixed veggies.  1 tsp olive oil.  3 oz grilled fish. Document Released: 02/04/2005 Document Revised: 04/29/2011 Document Reviewed: 12/21/2010 [facility] Patient Information 2013 [place], [place].

## 2012-03-27 ENCOUNTER — Encounter: Payer: Self-pay | Admitting: Family

## 2012-03-27 ENCOUNTER — Ambulatory Visit (INDEPENDENT_AMBULATORY_CARE_PROVIDER_SITE_OTHER): Payer: Medicare Other | Admitting: Family

## 2012-03-27 ENCOUNTER — Telehealth: Payer: Self-pay | Admitting: Internal Medicine

## 2012-03-27 VITALS — BP 148/90 | HR 64 | Temp 98.8°F | Wt 181.0 lb

## 2012-03-27 DIAGNOSIS — J189 Pneumonia, unspecified organism: Secondary | ICD-10-CM

## 2012-03-27 DIAGNOSIS — K219 Gastro-esophageal reflux disease without esophagitis: Secondary | ICD-10-CM

## 2012-03-27 NOTE — Progress Notes (Signed)
Subjective:    Patient ID: [name], male    DOB: 1941-02-18, 72 y.o.   MRN: [id number]  HPI  72 year old white male, nonsmoker, patient of Dr. Lovell Sheehan.  Presents this morning for follow up visit  for GERD and PNA from visit with Lubertha Sayres on  03/24/12. Verbalizes signs and symptoms of PNA improved, slight tightness in chest continues but improved, denies cough. Last dose of Avelox this morning. Continues to take Medrol dose pack as ordered until completed. GERD has resolved, he is taking Omeprazole once daily with no further complaint.       Review of Systems  Constitutional: Negative.   HENT: Negative.   Eyes: Negative.   Respiratory: Positive for chest tightness (mild tightness, improving).   Cardiovascular: Negative.   Gastrointestinal: Negative.   Genitourinary: Negative.   Musculoskeletal: Positive for back pain (chronic ) and arthralgias.  Neurological: Negative.   Hematological: Negative.   Psychiatric/Behavioral: Negative.    Past Medical History  Diagnosis Date  . Hypertension   . Hyperlipidemia   . Preoperative cardiovascular examination   . Spinal stenosis, lumbar   . CAS (cerebral atherosclerosis)   . Erectile dysfunction   . Preventative health care   . Benign prostatic hypertrophy with urinary obstruction   . Family history of early CAD     male 1st degree relative <50  . Peptic ulcer disease   . Low back pain   . Superficial spreading melanoma   . Sleep apnea, obstructive     CPAP-does not use  . HA (headache)     sinus headaches  . Arthritis     knees  . Myocardial infarction 1997    Hx MI 1997 and 1998  . Anxiety   . Pneumonia 2005  . Anemia   . Depression     takes Zoloft  . Neuropathy, autonomic, idiopathic peripheral, other   . Blood transfusion 1 yr. ago    jerking,fever-after blood transfusion  . Blood transfusion     given wrong blood  . Coronary artery disease 1998    cardiac stent/ Clearance Dr Lovell Sheehan and Jacinto Halim with note on  chart  . Dysrhythmia     hx of atrial fib per ov note of 12/12   . Bradycardia     hx of bradycardia in past per ov note of 12/12    History   Social History  . Marital Status: Married    Spouse Name: N/A    Number of Children: N/A  . Years of Education: N/A   Occupational History  . diesel repair     part time  . worked at [employer] History Main Topics  . Smoking status: Former Smoker -- 1.0 packs/day for 15 years    Types: Cigars    Quit date: 12/23/2009  . Smokeless tobacco: Never Used     Comment: smoked a pipe for 6-7 y ears  . Alcohol Use: No  . Drug Use: No  . Sexually Active: Not on file   Other Topics Concern  . Not on file   Social History Narrative  . No narrative on file    Past Surgical History  Procedure Date  . Esophagogastroduodenoscopy 2004  . Colonoscopy 2004  . Lumbar laminectomy   . Lumbar fusion   . Knee arthroscopy     left  . Shoulder arthroscopy     right  . Knee arthroscopy     right  . Decompression of the median nerve  left wrist and hand  . Revision of tkr 2011    on left  . Coronary stent placement   . Back surgery 2005-last    lumbar x2  . Excisional total knee arthroplasty 12/25/2010    Procedure: EXCISIONAL TOTAL KNEE ARTHROPLASTY;  Surgeon: Shelda Pal;  Location: [facility];  Service: Orthopedics;  Laterality: Right;  repeat irrigation   and debridement, removal and reinsertation of spacer block  . Joint replacement 2011    left knee x 2 ; 08/2010-right knee-infected  . I&d extremity 03/25/2011    Procedure: IRRIGATION AND DEBRIDEMENT EXTREMITY;  Surgeon: Shelda Pal, MD;  Location: [facility];  Service: Orthopedics;  Laterality: Right;  . Coronary angioplasty 1998    stents 1998   . Total knee revision 05/13/2011    Procedure: TOTAL KNEE REVISION;  Surgeon: Shelda Pal, MD;  Location: [facility];  Service: Orthopedics;  Laterality: Right;  Reimplantation    No family history on file.  Allergies  Allergen  Reactions  . Acetaminophen Other (See Comments)    LIVER INFLAMMATION IN HIGH DOSES    Current Outpatient Prescriptions on File Prior to Visit  Medication Sig Dispense Refill  . aspirin EC 81 MG tablet Take 81 mg by mouth daily.      . Azelastine-Fluticasone (DYMISTA) 137-50 MCG/ACT SUSP Place into the nose 2 (two) times daily.      . baclofen (LIORESAL) 10 MG tablet Take 1 tablet (10 mg total) by mouth 3 (three) times daily as needed.  90 each  3  . bisoprolol (ZEBETA) 5 MG tablet Take 2.5 mg by mouth daily after breakfast.       . Chlorphen-Phenyleph-ASA (ALKA-SELTZER PLUS COLD PO) Take 1 tablet by mouth daily as needed. For indigestion      . gabapentin (NEURONTIN) 400 MG capsule Take 400 mg by mouth 3 (three) times daily.      Marland Kitchen lisinopril (PRINIVIL,ZESTRIL) 20 MG tablet Take 10 mg by mouth daily.      . methylPREDNISolone (MEDROL DOSEPAK) 4 MG tablet follow package directions  21 tablet  0  . morphine (MS CONTIN) 30 MG 12 hr tablet Take 1 tablet (30 mg total) by mouth 3 (three) times daily.  90 tablet  0  . omeprazole (PRILOSEC OTC) 20 MG tablet Take 20 mg by mouth daily.      Marland Kitchen omeprazole (PRILOSEC) 20 MG capsule Take 1 capsule (20 mg total) by mouth daily.  30 capsule  3  . polyethylene glycol (MIRALAX / GLYCOLAX) packet Take 17 g by mouth daily.  14 each  0  . simvastatin (ZOCOR) 40 MG tablet Take 40 mg by mouth at bedtime.      . moxifloxacin (AVELOX) 400 MG tablet Take 1 tablet (400 mg total) by mouth daily.  7 tablet  0  . [DISCONTINUED] zolpidem (AMBIEN) 5 MG tablet Take 1 tablet (5 mg total) by mouth at bedtime as needed for sleep.  20 tablet  0    BP 148/90  Pulse 64  Temp 98.8 F (37.1 C) (Oral)  Wt 181 lb (82.101 kg)  SpO2 98%chart     Objective:   Physical Exam  Constitutional: He is oriented to person, place, and time. He appears well-developed and well-nourished.  HENT:  Head: Normocephalic.  Eyes: EOM are normal. Pupils are equal, round, and reactive to light.   Neck: Normal range of motion. Neck supple.  Cardiovascular: Normal rate and regular rhythm.   Pulmonary/Chest: Effort normal and breath  sounds normal.  Abdominal: Soft. Bowel sounds are normal.  Musculoskeletal: Normal range of motion.  Neurological: He is alert and oriented to person, place, and time.  Skin: Skin is warm and dry.  Psychiatric: He has a normal mood and affect.          Assessment & Plan:  Assessment:  1. GERD 2. Pneumonia  Plan: Pt is improved, anticipate mild chest tightness to improve with continuation of Medrol dose pack. Call office if symptoms persist. Follow up with CXR in 6 weeks.

## 2012-03-27 NOTE — Telephone Encounter (Signed)
Patient Information:  Caller Name: Dredyn  Phone: [phone]  Patient: [name], [name]  Gender: Male  DOB: Nov 28, 1940  Age: 72 Years  PCP: Darryll Capers (Adults only)  Office Follow Up:  Does the office need to follow up with this patient?: No  Instructions For The Office: N/A   Symptoms  Reason For Call & Symptoms: Patient calling, took his last Avalox this am but still having sx.  Dx with pneumonia on 1/31 then seen in the office on Monday 2/3 .  Reviewed Health History In EMR: Yes  Reviewed Medications In EMR: Yes  Reviewed Allergies In EMR: Yes  Reviewed Surgeries / Procedures: Yes  Date of Onset of Symptoms: 03/20/2012  Treatments Tried: avalox  Treatments Tried Worked: Yes  Guideline(s) Used:  No Protocol Available - Sick Adult  Disposition Per Guideline:   See Today in Office  Reason For Disposition Reached:   Nursing judgment  Advice Given:  N/A  Appointment Scheduled:  03/27/2012 10:00:00 Appointment Scheduled Provider:  Adline Mango [facility])  (This appointment had already been scheduled for patient and he will keep).

## 2012-04-01 ENCOUNTER — Telehealth: Payer: Self-pay | Admitting: Internal Medicine

## 2012-04-01 DIAGNOSIS — M549 Dorsalgia, unspecified: Secondary | ICD-10-CM

## 2012-04-01 MED ORDER — MORPHINE SULFATE ER 30 MG PO TBCR: 30.0000 mg | EXTENDED_RELEASE_TABLET | Freq: Three times a day (TID) | ORAL | Status: DC

## 2012-04-01 NOTE — Telephone Encounter (Signed)
Printed script and will call pt for pick up after dr Lenard Forth signs

## 2012-04-01 NOTE — Telephone Encounter (Signed)
Pt need new rx morphine °

## 2012-04-13 ENCOUNTER — Telehealth: Payer: Self-pay | Admitting: Internal Medicine

## 2012-04-13 NOTE — Telephone Encounter (Signed)
Patient Information:  Caller Name: Karen  Phone: [phone]  Patient: [name], [name]  Gender: Male  DOB: 16-Feb-1941  Age: 72 Years  PCP: Darryll Capers (Adults only)  Office Follow Up:  Does the office need to follow up with this patient?: No  Instructions For The Office: N/A   Symptoms  Reason For Call & Symptoms: Patient calling, is on Morphine SR 30mg  tabs tid for severe back pain.  Has noticed that "it is hard to breath".  He skipped a dose today to see if his breathing improved and he could tell a big difference in his breathing.  He had to take another one due to the back pain.  Reviewed Health History In EMR: Yes  Reviewed Medications In EMR: Yes  Reviewed Allergies In EMR: Yes  Reviewed Surgeries / Procedures: Yes  Date of Onset of Symptoms: 03/23/2012  Treatments Tried: skipped a dose of medication today and had improvement.  Treatments Tried Worked: Yes  Guideline(s) Used:  Breathing Difficulty  Disposition Per Guideline:   Go to Office Now  Reason For Disposition Reached:   Mild difficulty breathing (e.g., minimal/no SOB at rest, SOB with walking, pulse < 100) of new onset or worse than normal  Advice Given:  N/A  Sent to ED now for evaluation.

## 2012-04-14 ENCOUNTER — Emergency Department (HOSPITAL_COMMUNITY): Payer: Medicare Other

## 2012-04-14 ENCOUNTER — Encounter (HOSPITAL_COMMUNITY): Payer: Self-pay | Admitting: Cardiology

## 2012-04-14 ENCOUNTER — Observation Stay (HOSPITAL_COMMUNITY)
Admission: EM | Admit: 2012-04-14 | Discharge: 2012-04-15 | Disposition: A | Discharge status: Home / Self Care | Payer: Medicare Other | Attending: Emergency Medicine | Admitting: Emergency Medicine

## 2012-04-14 DIAGNOSIS — R079 Chest pain, unspecified: Secondary | ICD-10-CM | POA: Insufficient documentation

## 2012-04-14 DIAGNOSIS — Z7982 Long term (current) use of aspirin: Secondary | ICD-10-CM | POA: Insufficient documentation

## 2012-04-14 DIAGNOSIS — G4733 Obstructive sleep apnea (adult) (pediatric): Secondary | ICD-10-CM | POA: Insufficient documentation

## 2012-04-14 DIAGNOSIS — M51379 Other intervertebral disc degeneration, lumbosacral region without mention of lumbar back pain or lower extremity pain: Secondary | ICD-10-CM | POA: Insufficient documentation

## 2012-04-14 DIAGNOSIS — F3289 Other specified depressive episodes: Secondary | ICD-10-CM | POA: Insufficient documentation

## 2012-04-14 DIAGNOSIS — I10 Essential (primary) hypertension: Secondary | ICD-10-CM | POA: Insufficient documentation

## 2012-04-14 DIAGNOSIS — M1710 Unilateral primary osteoarthritis, unspecified knee: Secondary | ICD-10-CM | POA: Insufficient documentation

## 2012-04-14 DIAGNOSIS — M5137 Other intervertebral disc degeneration, lumbosacral region: Secondary | ICD-10-CM | POA: Insufficient documentation

## 2012-04-14 DIAGNOSIS — I252 Old myocardial infarction: Secondary | ICD-10-CM | POA: Insufficient documentation

## 2012-04-14 DIAGNOSIS — I441 Atrioventricular block, second degree: Principal | ICD-10-CM | POA: Insufficient documentation

## 2012-04-14 DIAGNOSIS — G894 Chronic pain syndrome: Secondary | ICD-10-CM | POA: Insufficient documentation

## 2012-04-14 DIAGNOSIS — Z79899 Other long term (current) drug therapy: Secondary | ICD-10-CM | POA: Insufficient documentation

## 2012-04-14 DIAGNOSIS — R51 Headache: Secondary | ICD-10-CM | POA: Insufficient documentation

## 2012-04-14 DIAGNOSIS — Z8249 Family history of ischemic heart disease and other diseases of the circulatory system: Secondary | ICD-10-CM | POA: Insufficient documentation

## 2012-04-14 DIAGNOSIS — I495 Sick sinus syndrome: Secondary | ICD-10-CM | POA: Insufficient documentation

## 2012-04-14 DIAGNOSIS — G909 Disorder of the autonomic nervous system, unspecified: Secondary | ICD-10-CM | POA: Insufficient documentation

## 2012-04-14 DIAGNOSIS — F411 Generalized anxiety disorder: Secondary | ICD-10-CM | POA: Insufficient documentation

## 2012-04-14 DIAGNOSIS — I2510 Atherosclerotic heart disease of native coronary artery without angina pectoris: Secondary | ICD-10-CM | POA: Insufficient documentation

## 2012-04-14 DIAGNOSIS — E785 Hyperlipidemia, unspecified: Secondary | ICD-10-CM | POA: Insufficient documentation

## 2012-04-14 DIAGNOSIS — I672 Cerebral atherosclerosis: Secondary | ICD-10-CM | POA: Insufficient documentation

## 2012-04-14 DIAGNOSIS — F329 Major depressive disorder, single episode, unspecified: Secondary | ICD-10-CM | POA: Insufficient documentation

## 2012-04-14 DIAGNOSIS — F1120 Opioid dependence, uncomplicated: Secondary | ICD-10-CM | POA: Insufficient documentation

## 2012-04-14 DIAGNOSIS — K219 Gastro-esophageal reflux disease without esophagitis: Secondary | ICD-10-CM | POA: Insufficient documentation

## 2012-04-14 HISTORY — DX: Gastro-esophageal reflux disease without esophagitis: K21.9

## 2012-04-14 LAB — CBC WITH DIFFERENTIAL/PLATELET
Basophils Absolute: 0.1 10*3/uL (ref 0.0–0.1)
Basophils Relative: 1 % (ref 0–1)
Eosinophils Absolute: 0.3 10*3/uL (ref 0.0–0.7)
MCH: 32.6 pg (ref 26.0–34.0)
MCHC: 35.4 g/dL (ref 30.0–36.0)
Neutro Abs: 5.5 10*3/uL (ref 1.7–7.7)
Neutrophils Relative %: 65 % (ref 43–77)
Platelets: 186 10*3/uL (ref 150–400)

## 2012-04-14 LAB — BASIC METABOLIC PANEL
BUN: 15 mg/dL (ref 6–23)
Chloride: 104 mEq/L (ref 96–112)
GFR calc Af Amer: 79 mL/min — ABNORMAL LOW (ref >90)
GFR calc non Af Amer: 68 mL/min — ABNORMAL LOW (ref >90)
Potassium: 4.2 mEq/L (ref 3.5–5.1)
Sodium: 143 mEq/L (ref 135–145)

## 2012-04-14 LAB — POCT I-STAT TROPONIN I: Troponin i, poc: 0.04 ng/mL (ref 0.00–0.08)

## 2012-04-14 LAB — D-DIMER, QUANTITATIVE: D-Dimer, Quant: 1.44 ug/mL-FEU — ABNORMAL HIGH (ref 0.00–0.48)

## 2012-04-14 MED ORDER — MORPHINE SULFATE 4 MG/ML IJ SOLN
4.0000 mg | Freq: Once | INTRAMUSCULAR | Status: AC
  Administered 2012-04-14: 4 mg via INTRAVENOUS
  Filled 2012-04-14: qty 1

## 2012-04-14 MED ORDER — SODIUM CHLORIDE 0.9 % IV SOLN: 250.0000 mL | INTRAVENOUS | Status: DC | PRN

## 2012-04-14 MED ORDER — DOCUSATE SODIUM 100 MG PO CAPS
100.0000 mg | ORAL_CAPSULE | Freq: Two times a day (BID) | ORAL | Status: DC
  Administered 2012-04-15: 100 mg via ORAL
  Filled 2012-04-14 (×3): qty 1

## 2012-04-14 MED ORDER — AZELASTINE HCL 0.1 % NA SOLN
1.0000 | Freq: Two times a day (BID) | NASAL | Status: DC
  Filled 2012-04-14: qty 30

## 2012-04-14 MED ORDER — POLYETHYLENE GLYCOL 3350 17 G PO PACK
17.0000 g | PACK | Freq: Every day | ORAL | Status: DC
  Filled 2012-04-14 (×2): qty 1

## 2012-04-14 MED ORDER — OXYCODONE HCL 5 MG PO TABS
5.0000 mg | ORAL_TABLET | ORAL | Status: DC | PRN
  Administered 2012-04-15 (×2): 5 mg via ORAL
  Filled 2012-04-14 (×2): qty 1

## 2012-04-14 MED ORDER — LISINOPRIL 10 MG PO TABS
10.0000 mg | ORAL_TABLET | Freq: Every day | ORAL | Status: DC
  Administered 2012-04-14 – 2012-04-15 (×2): 10 mg via ORAL
  Filled 2012-04-14 (×2): qty 1

## 2012-04-14 MED ORDER — SODIUM CHLORIDE 0.9 % IJ SOLN
3.0000 mL | Freq: Two times a day (BID) | INTRAMUSCULAR | Status: DC
  Administered 2012-04-14 – 2012-04-15 (×2): 3 mL via INTRAVENOUS

## 2012-04-14 MED ORDER — FENTANYL CITRATE 0.05 MG/ML IJ SOLN
50.0000 ug | INTRAMUSCULAR | Status: DC | PRN
  Administered 2012-04-14: 50 ug via INTRAVENOUS
  Filled 2012-04-14 (×2): qty 2

## 2012-04-14 MED ORDER — MORPHINE SULFATE ER 15 MG PO TBCR
30.0000 mg | EXTENDED_RELEASE_TABLET | Freq: Three times a day (TID) | ORAL | Status: DC
  Administered 2012-04-14 – 2012-04-15 (×2): 30 mg via ORAL
  Filled 2012-04-14 (×2): qty 2

## 2012-04-14 MED ORDER — ASPIRIN EC 81 MG PO TBEC
81.0000 mg | DELAYED_RELEASE_TABLET | Freq: Every day | ORAL | Status: DC
  Administered 2012-04-15: 81 mg via ORAL
  Filled 2012-04-14 (×2): qty 1

## 2012-04-14 MED ORDER — FLUTICASONE PROPIONATE 50 MCG/ACT NA SUSP
1.0000 | Freq: Two times a day (BID) | NASAL | Status: DC
  Administered 2012-04-14 – 2012-04-15 (×2): 1 via NASAL
  Filled 2012-04-14: qty 16

## 2012-04-14 MED ORDER — AZELASTINE HCL 0.1 % NA SOLN
1.0000 | Freq: Two times a day (BID) | NASAL | Status: DC
  Administered 2012-04-14 – 2012-04-15 (×2): 1 via NASAL
  Filled 2012-04-14: qty 30

## 2012-04-14 MED ORDER — BACLOFEN 10 MG PO TABS
10.0000 mg | ORAL_TABLET | Freq: Three times a day (TID) | ORAL | Status: DC | PRN
  Administered 2012-04-14: 10 mg via ORAL
  Filled 2012-04-14 (×2): qty 1

## 2012-04-14 MED ORDER — GABAPENTIN 400 MG PO CAPS
400.0000 mg | ORAL_CAPSULE | Freq: Three times a day (TID) | ORAL | Status: DC
  Administered 2012-04-14 – 2012-04-15 (×2): 400 mg via ORAL
  Filled 2012-04-14 (×5): qty 1

## 2012-04-14 MED ORDER — ONDANSETRON HCL 4 MG/2ML IJ SOLN: 4.0000 mg | Freq: Four times a day (QID) | INTRAMUSCULAR | Status: DC | PRN

## 2012-04-14 MED ORDER — SODIUM CHLORIDE 0.9 % IV SOLN
INTRAVENOUS | Status: AC
  Administered 2012-04-14: 13:00:00 via INTRAVENOUS

## 2012-04-14 MED ORDER — POLYETHYLENE GLYCOL 3350 17 G PO PACK
17.0000 g | PACK | Freq: Every day | ORAL | Status: DC | PRN
  Filled 2012-04-14: qty 1

## 2012-04-14 MED ORDER — ENOXAPARIN SODIUM 40 MG/0.4ML ~~LOC~~ SOLN
40.0000 mg | SUBCUTANEOUS | Status: DC
  Filled 2012-04-14 (×2): qty 0.4

## 2012-04-14 MED ORDER — SODIUM CHLORIDE 0.9 % IJ SOLN: 3.0000 mL | Freq: Two times a day (BID) | INTRAMUSCULAR | Status: DC

## 2012-04-14 MED ORDER — SODIUM CHLORIDE 0.9 % IJ SOLN: 3.0000 mL | INTRAMUSCULAR | Status: DC | PRN

## 2012-04-14 MED ORDER — PANTOPRAZOLE SODIUM 40 MG PO TBEC
40.0000 mg | DELAYED_RELEASE_TABLET | Freq: Every day | ORAL | Status: DC
  Administered 2012-04-14 – 2012-04-15 (×2): 40 mg via ORAL
  Filled 2012-04-14 (×2): qty 1

## 2012-04-14 MED ORDER — SENNOSIDES-DOCUSATE SODIUM 8.6-50 MG PO TABS
1.0000 | ORAL_TABLET | Freq: Every evening | ORAL | Status: DC | PRN
  Filled 2012-04-14: qty 1

## 2012-04-14 MED ORDER — SIMVASTATIN 40 MG PO TABS
40.0000 mg | ORAL_TABLET | Freq: Every day | ORAL | Status: DC
  Administered 2012-04-14: 40 mg via ORAL
  Filled 2012-04-14 (×2): qty 1

## 2012-04-14 MED ORDER — FLUTICASONE PROPIONATE 50 MCG/ACT NA SUSP
1.0000 | Freq: Two times a day (BID) | NASAL | Status: DC
  Filled 2012-04-14: qty 16

## 2012-04-14 MED ORDER — AZELASTINE-FLUTICASONE 137-50 MCG/ACT NA SUSP: 1.0000 | Freq: Two times a day (BID) | NASAL | Status: DC

## 2012-04-14 NOTE — Progress Notes (Signed)
Orders received for fentanyl for pain. Verified with Dr. Jacinto Halim that it was ok to give him this med with heart block. He stated it was ok. Will cont to closely monitor

## 2012-04-14 NOTE — ED Provider Notes (Signed)
Medical screening examination/treatment/procedure(s) were conducted as a shared visit with non-physician practitioner(s) and myself.  I personally evaluated the patient during the encounter   Shelda Jakes, MD 04/14/12 1356

## 2012-04-14 NOTE — Progress Notes (Signed)
Notified Dr. Jacinto Halim that pt is in a second degree hb type 1 and complaining of severe headache. He will come to see pt shortly

## 2012-04-14 NOTE — H&P (Addendum)
[name] is an 72 y.o. male.   Chief Complaint: chest pain HPI: patient is a 72 year old Caucasian male with chronic pain syndrome and chronic opioid dependence had been doing well until about a month ago had shortness of breath, chest pain and was sent to the emergency department for evaluation of the same.  At that time he had EKG showing junctional rhythm and d-dimer was elevated had CT angiogram of the chest was diagnosed with pneumonia right base and discharged home on antibiotics.  This morning he woke up and stated that he had sharp stabbing chest pain on the left side of the chest which was continuous and he took a sublingual nitroglycerin without any relief.  Eventually he activated the EMS who gave him 3 sublingual nitroglycerin and he stated that his chest pain started to get better.  He was then brought to the emergency department for further evaluation.  Due to his known coronary artery disease he is now being evaluated and admitted for FURTHER workup. His wife and his daughter are present at the bedside.patient states that he has persistent "little discomfort" in the left upper part of the chest.  This is continuous.  His main complaint is headache and request pain medication. He denies any fever, leg edema, syncope, dizziness.  He denies any hemoptysis.  Past Medical History  Diagnosis Date  . Hypertension   . Hyperlipidemia   . Preoperative cardiovascular examination   . Spinal stenosis, lumbar   . CAS (cerebral atherosclerosis)   . Erectile dysfunction   . Preventative health care   . Benign prostatic hypertrophy with urinary obstruction   . Family history of early CAD     male 1st degree relative <50  . Peptic ulcer disease   . Low back pain   . Superficial spreading melanoma   . Sleep apnea, obstructive     CPAP-does not use  . HA (headache)     sinus headaches  . Arthritis     knees  . Myocardial infarction 1997    Hx MI 1997 and 1998  . Anxiety   . Pneumonia  2005  . Anemia   . Depression     takes Zoloft  . Neuropathy, autonomic, idiopathic peripheral, other   . Blood transfusion 1 yr. ago    jerking,fever-after blood transfusion  . Blood transfusion     given wrong blood  . Coronary artery disease 1998    cardiac stent/ Clearance Dr Lovell Sheehan and Jacinto Halim with note on chart  . Dysrhythmia     hx of atrial fib per ov note of 12/12   . Bradycardia     hx of bradycardia in past per ov note of 12/12  . GERD (gastroesophageal reflux disease)     Past Surgical History  Procedure Laterality Date  . Esophagogastroduodenoscopy  2004  . Colonoscopy  2004  . Lumbar laminectomy    . Lumbar fusion    . Knee arthroscopy      left  . Shoulder arthroscopy      right  . Knee arthroscopy      right  . Decompression of the median nerve      left wrist and hand  . Revision of tkr  2011    on left  . Coronary stent placement    . Back surgery  2005-last    lumbar x2  . Excisional total knee arthroplasty  12/25/2010    Procedure: EXCISIONAL TOTAL KNEE ARTHROPLASTY;  Surgeon: Shelda Pal;  Location: [facility];  Service: Orthopedics;  Laterality: Right;  repeat irrigation   and debridement, removal and reinsertation of spacer block  . Joint replacement  2011    left knee x 2 ; 08/2010-right knee-infected  . I&d extremity  03/25/2011    Procedure: IRRIGATION AND DEBRIDEMENT EXTREMITY;  Surgeon: Shelda Pal, MD;  Location: [facility];  Service: Orthopedics;  Laterality: Right;  . Coronary angioplasty  1998    stents 1998   . Total knee revision  05/13/2011    Procedure: TOTAL KNEE REVISION;  Surgeon: Shelda Pal, MD;  Location: [facility];  Service: Orthopedics;  Laterality: Right;  Reimplantation    History reviewed. No pertinent family history. Social History:  reports that he quit smoking about 2 years ago. His smoking use included Cigars. He has never used smokeless tobacco. He reports that he does not drink alcohol or use illicit drugs.  Allergies:   Allergies  Allergen Reactions  . Acetaminophen Other (See Comments)    LIVER INFLAMMATION IN HIGH DOSES    Medications Prior to Admission  Medication Sig Dispense Refill  . Azelastine-Fluticasone (DYMISTA) 137-50 MCG/ACT SUSP Place into the nose 2 (two) times daily.      . baclofen (LIORESAL) 10 MG tablet Take 1 tablet (10 mg total) by mouth 3 (three) times daily as needed.  90 each  3  . bisoprolol (ZEBETA) 5 MG tablet Take 2.5 mg by mouth daily after breakfast.       . Chlorphen-Phenyleph-ASA (ALKA-SELTZER PLUS COLD PO) Take 1 tablet by mouth daily as needed. For indigestion      . gabapentin (NEURONTIN) 400 MG capsule Take 400 mg by mouth 3 (three) times daily.      Marland Kitchen lisinopril (PRINIVIL,ZESTRIL) 20 MG tablet Take 10 mg by mouth daily.      Marland Kitchen morphine (MS CONTIN) 30 MG 12 hr tablet Take 1 tablet (30 mg total) by mouth 3 (three) times daily.  90 tablet  0  . omeprazole (PRILOSEC) 20 MG capsule Take 1 capsule (20 mg total) by mouth daily.  30 capsule  3  . polyethylene glycol (MIRALAX / GLYCOLAX) packet Take 17 g by mouth daily as needed (constipation).      . simvastatin (ZOCOR) 40 MG tablet Take 40 mg by mouth at bedtime.      Marland Kitchen aspirin EC 81 MG tablet Take 81 mg by mouth daily.       Review of Systems - General ROS: positive for  - generalized weakness, severe back pain,, severe leg cramps both lower extremities,  negative for - chills, fever or he hemoptysis, cough, nausea, vomiting, syncope.  No diabetes.  Blood pressure 148/96, pulse 59, temperature 97.5 F (36.4 C), temperature source Oral, resp. rate 18, height 5\' 10"  (1.778 m), weight 85.367 kg (188 lb 3.2 oz), SpO2 94.00%. General appearance: alert, cooperative, appears stated age and no distress Eyes: normal grossly Neck: no adenopathy, no carotid bruit, no JVD, supple, symmetrical, trachea midline and thyroid not enlarged, symmetric, no tenderness/mass/nodules Neck: JVP - normal, carotids 2+= without bruits Resp: rales  LLL Chest wall: no tenderness Cardio: regular rate and rhythm, S1, S2 normal, no murmur, click, rub or gallop GI: soft, non-tender; bowel sounds normal; no masses,  no organomegaly Extremities: bilateral 1-2+ globally pitting edema without any tenderness. Pulses: 2+ and symmetric Skin: Skin color, texture, turgor normal. No rashes or lesions Neurologic: Grossly normal  Results for orders placed during the hospital encounter of 04/14/12 (from the past  48 hour(s))  CBC WITH DIFFERENTIAL     Status: None   Collection Time    04/14/12  9:57 AM      Result Value Range   WBC 8.5  4.0 - 10.5 K/uL   RBC 4.51  4.22 - 5.81 MIL/uL   Hemoglobin 14.7  13.0 - 17.0 g/dL   HCT 04.5  40.9 - 81.1 %   MCV 92.0  78.0 - 100.0 fL   MCH 32.6  26.0 - 34.0 pg   MCHC 35.4  30.0 - 36.0 g/dL   RDW 91.4  78.2 - 95.6 %   Platelets 186  150 - 400 K/uL   Neutrophils Relative 65  43 - 77 %   Neutro Abs 5.5  1.7 - 7.7 K/uL   Lymphocytes Relative 26  12 - 46 %   Lymphs Abs 2.2  0.7 - 4.0 K/uL   Monocytes Relative 6  3 - 12 %   Monocytes Absolute 0.5  0.1 - 1.0 K/uL   Eosinophils Relative 3  0 - 5 %   Eosinophils Absolute 0.3  0.0 - 0.7 K/uL   Basophils Relative 1  0 - 1 %   Basophils Absolute 0.1  0.0 - 0.1 K/uL  BASIC METABOLIC PANEL     Status: Abnormal   Collection Time    04/14/12  9:57 AM      Result Value Range   Sodium 143  135 - 145 mEq/L   Potassium 4.2  3.5 - 5.1 mEq/L   Chloride 104  96 - 112 mEq/L   CO2 27  19 - 32 mEq/L   Glucose, Bld 108 (*) 70 - 99 mg/dL   BUN 15  6 - 23 mg/dL   Creatinine, Ser 2.13  0.50 - 1.35 mg/dL   Calcium 08.6  8.4 - 57.8 mg/dL   GFR calc non Af Amer 68 (*) >90 mL/min   GFR calc Af Amer 79 (*) >90 mL/min   Comment:            The eGFR has been calculated     using the CKD EPI equation.     This calculation has not been     validated in all clinical     situations.     eGFR's persistently     <90 mL/min signify     possible Chronic Kidney Disease.  POCT  I-STAT TROPONIN I     Status: None   Collection Time    04/14/12 10:04 AM      Result Value Range   Troponin i, poc 0.04  0.00 - 0.08 ng/mL   Comment 3            Comment: Due to the release kinetics of cTnI,     a negative result within the first hours     of the onset of symptoms does not rule out     myocardial infarction with certainty.     If myocardial infarction is still suspected,     repeat the test at appropriate intervals.  D-DIMER, QUANTITATIVE     Status: Abnormal   Collection Time    04/14/12 11:11 AM      Result Value Range   D-Dimer, Quant 1.44 (*) 0.00 - 0.48 ug/mL-FEU   Comment:            AT THE INHOUSE ESTABLISHED CUTOFF     VALUE OF 0.48 ug/mL FEU,     THIS ASSAY HAS BEEN DOCUMENTED  IN THE LITERATURE TO HAVE     A SENSITIVITY AND NEGATIVE     PREDICTIVE VALUE OF AT LEAST     98 TO 99%.  THE TEST RESULT     SHOULD BE CORRELATED WITH     AN ASSESSMENT OF THE CLINICAL     PROBABILITY OF DVT / VTE.  TROPONIN I     Status: None   Collection Time    04/14/12  1:00 PM      Result Value Range   Troponin I <0.30  <0.30 ng/mL   Comment:            Due to the release kinetics of cTnI,     a negative result within the first hours     of the onset of symptoms does not rule out     myocardial infarction with certainty.     If myocardial infarction is still suspected,     repeat the test at appropriate intervals.  TROPONIN I     Status: None   Collection Time    04/14/12  4:10 PM      Result Value Range   Troponin I <0.30  <0.30 ng/mL   Comment:            Due to the release kinetics of cTnI,     a negative result within the first hours     of the onset of symptoms does not rule out     myocardial infarction with certainty.     If myocardial infarction is still suspected,     repeat the test at appropriate intervals.   Dg Chest Port 1 View  04/14/2012  *RADIOLOGY REPORT*  Clinical Data: Chest pain  PORTABLE CHEST - 1 VIEW  Comparison: 03/20/2012   Findings: Tortuous and unfolded thoracic aorta noted.  There is mild cardiac enlargement identified.  No effusions or edema identified.  No airspace consolidation.  IMPRESSION:  1.  Cardiac enlargement.   Original Report Authenticated By: Signa Kell, M.D.     Labs:   Lab Results  Component Value Date   WBC 8.5 04/14/2012   HGB 14.7 04/14/2012   HCT 41.5 04/14/2012   MCV 92.0 04/14/2012   PLT 186 04/14/2012    Recent Labs Lab 04/14/12 0957  NA 143  K 4.2  CL 104  CO2 27  BUN 15  CREATININE 1.05  CALCIUM 10.3  GLUCOSE 108*   Lab Results  Component Value Date   CKTOTAL 76 03/25/2011   CKMB 5.7* 03/25/2011   TROPONINI <0.30 04/14/2012    Lipid Panel     Component Value Date/Time   CHOL 114 03/28/2010 1147   TRIG 201.0* 03/28/2010 1147   HDL 25.20* 03/28/2010 1147   CHOLHDL 5 03/28/2010 1147   VLDL 40.2* 03/28/2010 1147    EKG: Sinus rhythm at rate of 56 bpm with first-degree AV block, left atrial abnormality, frequent PACs with compensated pause is without evidence of high degree AV block. EKG 03/20/2012: Junctional escape rhythm. Office EKG 11/27/2011: Sinus bradycardia at a rate of 54 bpm with first degree AV block, left atrial enlargement, frequent PACs. ECG 08/29/11: Junctional escape rhythm @ 54/min. Normal QRS duration. No ischemia. ECG 01/30/11: NSR, normal intervals. no ischemia.Compared to 09/26/10, A. Fibrillation no longer present.  Assessment/Plan  1. 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 2. CAD/ASHD MI and h/o Stenting in 1998 right coronary artery (40% ISR but patent) and also OM branch of circumflex coronary  artery. Has a small D1 with high grade stenosis. Out patient Nuclear stress test 5/10: Small Apical scar without ischemia.   EF 59%.   Low risk. 3. Hypertension 4. Obstructive sleep apnea unable to tolerate CPAP. 5. Chronic pain syndrome on chronic morphine. Lumbosacral disc disease. Contemplating spinal stimulator implant by Dr. Sheran Luz. I do not see any contraindication for this. Even if he were to need a permanent pacemaker, we should be able to adjust the threshold. 6. Anxiety and depression.  Recommendation: In spite of chest pain lasting for 30-40 minutes or probably an hour, no EKG changes of ischemia, cardiac markers are negative for myocardial injury.  We'll continue to monitor.  I have been contemplating on pacemaker implantation for a while, I'm going to stop his beta blocker for now and watch him overnight and make further recommendation regarding this.  He has chronic pain syndrome and it is very difficult to evaluate his symptomatology.  He has previously has had a low risk stress test.  However progression of coronary artery disease is to be kept in mind.  Pamella Pert, MD 04/14/2012, 6:11 PM [facility] Pager: [phone] Office: [phone] If no answer: Cell:  [phone]

## 2012-04-14 NOTE — ED Notes (Signed)
Pt to department via EMS- started having midsternal chest pain non-radiating with some SOB. 6/10 Recently dx with PNA and treated with antibiotics. Took 324 asa pta along 5 nitro PTA. 18g right wrist. Bp-140 palp Hr-50

## 2012-04-14 NOTE — ED Provider Notes (Signed)
History     CSN: [id number]  Arrival date & time 04/14/12  0916   First MD Initiated Contact with Patient 04/14/12 [phone]      Chief Complaint  Patient presents with  . Chest Pain    (Consider location/radiation/quality/duration/timing/severity/associated sxs/prior treatment) HPI Comments: This is a 72 year old male, past medical history remarkable for hypertension, hyperlipidemia, previous MI with stent placement, who presents emergency department with chief complaint of chest pain. Patient states the pain began this morning. He endorses associated shortness of breath. He states that it starts in the center of his chest and radiates to the left side of his chest. He has received 5 nitroglycerin and aspirin with no relief. Additionally, he states that he feels a little dizzy, he denies any diaphoresis, states that the chest pain came on at rest and denies any exertional component. She was seen approximately one month ago, and diagnosed with a CAP, for which he recently finished taking antibiotics. He is also complaining of worsening back and leg pain, which is not new for him. He normally takes MS Contin for this pain, but he did not take a dose this morning.  The history is provided by the patient. No language interpreter was used.    Past Medical History  Diagnosis Date  . Hypertension   . Hyperlipidemia   . Preoperative cardiovascular examination   . Spinal stenosis, lumbar   . CAS (cerebral atherosclerosis)   . Erectile dysfunction   . Preventative health care   . Benign prostatic hypertrophy with urinary obstruction   . Family history of early CAD     male 1st degree relative <50  . Peptic ulcer disease   . Low back pain   . Superficial spreading melanoma   . Sleep apnea, obstructive     CPAP-does not use  . HA (headache)     sinus headaches  . Arthritis     knees  . Myocardial infarction 1997    Hx MI 1997 and 1998  . Anxiety   . Pneumonia 2005  . Anemia   .  Depression     takes Zoloft  . Neuropathy, autonomic, idiopathic peripheral, other   . Blood transfusion 1 yr. ago    jerking,fever-after blood transfusion  . Blood transfusion     given wrong blood  . Coronary artery disease 1998    cardiac stent/ Clearance Dr Lovell Sheehan and Jacinto Halim with note on chart  . Dysrhythmia     hx of atrial fib per ov note of 12/12   . Bradycardia     hx of bradycardia in past per ov note of 12/12    Past Surgical History  Procedure Laterality Date  . Esophagogastroduodenoscopy  2004  . Colonoscopy  2004  . Lumbar laminectomy    . Lumbar fusion    . Knee arthroscopy      left  . Shoulder arthroscopy      right  . Knee arthroscopy      right  . Decompression of the median nerve      left wrist and hand  . Revision of tkr  2011    on left  . Coronary stent placement    . Back surgery  2005-last    lumbar x2  . Excisional total knee arthroplasty  12/25/2010    Procedure: EXCISIONAL TOTAL KNEE ARTHROPLASTY;  Surgeon: Shelda Pal;  Location: [facility];  Service: Orthopedics;  Laterality: Right;  repeat irrigation   and debridement, removal and reinsertation  of spacer block  . Joint replacement  2011    left knee x 2 ; 08/2010-right knee-infected  . I&d extremity  03/25/2011    Procedure: IRRIGATION AND DEBRIDEMENT EXTREMITY;  Surgeon: Shelda Pal, MD;  Location: [facility];  Service: Orthopedics;  Laterality: Right;  . Coronary angioplasty  1998    stents 1998   . Total knee revision  05/13/2011    Procedure: TOTAL KNEE REVISION;  Surgeon: Shelda Pal, MD;  Location: [facility];  Service: Orthopedics;  Laterality: Right;  Reimplantation    History reviewed. No pertinent family history.  History  Substance Use Topics  . Smoking status: Former Smoker -- 1.00 packs/day for 15 years    Types: Cigars    Quit date: 12/23/2009  . Smokeless tobacco: Never Used     Comment: smoked a pipe for 6-7 y ears  . Alcohol Use: No      Review of Systems  All other  systems reviewed and are negative.    Allergies  Acetaminophen  Home Medications   Current Outpatient Rx  Name  Route  Sig  Dispense  Refill  . Azelastine-Fluticasone (DYMISTA) 137-50 MCG/ACT SUSP   Nasal   Place into the nose 2 (two) times daily.         . baclofen (LIORESAL) 10 MG tablet   Oral   Take 1 tablet (10 mg total) by mouth 3 (three) times daily as needed.   90 each   3   . bisoprolol (ZEBETA) 5 MG tablet   Oral   Take 2.5 mg by mouth daily after breakfast.          . Chlorphen-Phenyleph-ASA (ALKA-SELTZER PLUS COLD PO)   Oral   Take 1 tablet by mouth daily as needed. For indigestion         . gabapentin (NEURONTIN) 400 MG capsule   Oral   Take 400 mg by mouth 3 (three) times daily.         Marland Kitchen lisinopril (PRINIVIL,ZESTRIL) 20 MG tablet   Oral   Take 10 mg by mouth daily.         Marland Kitchen morphine (MS CONTIN) 30 MG 12 hr tablet   Oral   Take 1 tablet (30 mg total) by mouth 3 (three) times daily.   90 tablet   0   . omeprazole (PRILOSEC) 20 MG capsule   Oral   Take 1 capsule (20 mg total) by mouth daily.   30 capsule   3   . polyethylene glycol (MIRALAX / GLYCOLAX) packet   Oral   Take 17 g by mouth daily as needed (constipation).         . simvastatin (ZOCOR) 40 MG tablet   Oral   Take 40 mg by mouth at bedtime.         Marland Kitchen aspirin EC 81 MG tablet   Oral   Take 81 mg by mouth daily.           BP 145/80  Pulse 66  Temp(Src) 97.9 F (36.6 C) (Oral)  Resp 18  SpO2 99%  Physical Exam  Nursing note and vitals reviewed. Constitutional: He is oriented to person, place, and time. He appears well-developed and well-nourished.  HENT:  Head: Normocephalic and atraumatic.  Eyes: Conjunctivae and EOM are normal. Pupils are equal, round, and reactive to light. Right eye exhibits no discharge. Left eye exhibits no discharge. No scleral icterus.  Neck: Normal range of motion. Neck supple. No JVD present.  Cardiovascular: Normal rate, regular  rhythm, normal heart sounds and intact distal pulses.  Exam reveals no gallop and no friction rub.   No murmur heard. Pulmonary/Chest: Effort normal and breath sounds normal. No respiratory distress. He has no wheezes. He has no rales. He exhibits no tenderness.  Abdominal: Soft. Bowel sounds are normal. He exhibits no distension and no mass. There is no tenderness. There is no rebound and no guarding.  Musculoskeletal: Normal range of motion. He exhibits no edema and no tenderness.  Neurological: He is alert and oriented to person, place, and time.  CN 3-12 intact  Skin: Skin is warm and dry.  Psychiatric: He has a normal mood and affect. His behavior is normal. Judgment and thought content normal.    ED Course  Procedures (including critical care time)  Labs Reviewed  CBC WITH DIFFERENTIAL  BASIC METABOLIC PANEL   Results for orders placed during the hospital encounter of 04/14/12  CBC WITH DIFFERENTIAL      Result Value Range   WBC 8.5  4.0 - 10.5 K/uL   RBC 4.51  4.22 - 5.81 MIL/uL   Hemoglobin 14.7  13.0 - 17.0 g/dL   HCT 21.3  08.6 - 57.8 %   MCV 92.0  78.0 - 100.0 fL   MCH 32.6  26.0 - 34.0 pg   MCHC 35.4  30.0 - 36.0 g/dL   RDW 46.9  62.9 - 52.8 %   Platelets 186  150 - 400 K/uL   Neutrophils Relative 65  43 - 77 %   Neutro Abs 5.5  1.7 - 7.7 K/uL   Lymphocytes Relative 26  12 - 46 %   Lymphs Abs 2.2  0.7 - 4.0 K/uL   Monocytes Relative 6  3 - 12 %   Monocytes Absolute 0.5  0.1 - 1.0 K/uL   Eosinophils Relative 3  0 - 5 %   Eosinophils Absolute 0.3  0.0 - 0.7 K/uL   Basophils Relative 1  0 - 1 %   Basophils Absolute 0.1  0.0 - 0.1 K/uL  BASIC METABOLIC PANEL      Result Value Range   Sodium 143  135 - 145 mEq/L   Potassium 4.2  3.5 - 5.1 mEq/L   Chloride 104  96 - 112 mEq/L   CO2 27  19 - 32 mEq/L   Glucose, Bld 108 (*) 70 - 99 mg/dL   BUN 15  6 - 23 mg/dL   Creatinine, Ser 4.13  0.50 - 1.35 mg/dL   Calcium 24.4  8.4 - 01.0 mg/dL   GFR calc non Af Amer 68 (*)  >90 mL/min   GFR calc Af Amer 79 (*) >90 mL/min  D-DIMER, QUANTITATIVE      Result Value Range   D-Dimer, Quant 1.44 (*) 0.00 - 0.48 ug/mL-FEU  POCT I-STAT TROPONIN I      Result Value Range   Troponin i, poc 0.04  0.00 - 0.08 ng/mL   Comment 3            Ct Angio Chest Pe W/cm &/or Wo Cm  03/20/2012  *RADIOLOGY REPORT*  Clinical Data: Shortness of breath, hypertension, back pain, evaluate for pulmonary embolism  CT ANGIOGRAPHY CHEST  Technique:  Multidetector CT imaging of the chest using the standard protocol during bolus administration of intravenous contrast. Multiplanar reconstructed images including MIPs were obtained and reviewed to evaluate the vascular anatomy.  Contrast: 80mL OMNIPAQUE IOHEXOL 350 MG/ML SOLN  Comparison: Chest radiograph - earlier  same day; CT abdomen pelvis - 02/10/2012  Vascular Findings:  There is adequate opacification of the pulmonary arterial tree with a pulmonary artery measuring 387 HU.  There are no discrete filling defects within the pulmonary arterial tree to suggest pulmonary embolism.  There is enlargement the caliber of the main pulmonary artery measuring approximately 3.8 cm in diameter.  Normal heart size.  Extensive coronary artery calcifications.  No pericardial effusion.  Scattered atherosclerotic plaque within a mildly tortuous but normal caliber thoracic aorta.  No definite periaortic stranding.  --------------------------------------------------  Nonvascular findings:  Interval development of ill-defined slightly nodular peripheral heterogeneous air space opacities within the dependent portion of the right lower lobe.  There is minimal geographic ground-glass within the medial basilar segment of the right lower lobe adjacent to exuberant thoracic spine syndesmophytes favored to represent atelectasis.  No pleural effusion or pneumothorax.  The central pulmonary airways are patent.  Scattered shoddy mediastinal lymph nodes are not enlarged by CT criteria.  No  mediastinal, hilar or axillary lymphadenopathy.  Limited visualization of the upper abdomen is normal.  No acute or aggressive osseous abnormalities.  Multilevel thoracic spine degenerative change.  IMPRESSION:  1.  Negative for pulmonary embolism. 2.  Interval development of minimal heterogeneous slightly nodular air space opacities within the right lower lobe, possibly atelectasis though developing infection not excluded.  3.  Enlarged caliber of the main pulmonary artery, nonspecific but may be seen in the setting of pulmonary arterial hypertension. Further evaluation with cardiac echo may be performed as clinically indicated. 4.  Coronary artery calcifications.  5. Multilevel thoracic spine degenerative change.   Original Report Authenticated By: Tacey Ruiz, MD    Dg Chest Port 1 View  04/14/2012  *RADIOLOGY REPORT*  Clinical Data: Chest pain  PORTABLE CHEST - 1 VIEW  Comparison: 03/20/2012  Findings: Tortuous and unfolded thoracic aorta noted.  There is mild cardiac enlargement identified.  No effusions or edema identified.  No airspace consolidation.  IMPRESSION:  1.  Cardiac enlargement.   Original Report Authenticated By: Signa Kell, M.D.    Dg Chest Port 1 View  03/20/2012  *RADIOLOGY REPORT*  Clinical Data: Shortness of breath, dizziness  PORTABLE CHEST - 1 VIEW  Comparison: 09/09/2011  Findings: Lungs are essentially clear. No pleural effusion or pneumothorax.  Mild cardiomegaly.  Degenerative changes of the visualized thoracolumbar spine and bilateral shoulders.  IMPRESSION: No evidence of acute cardiopulmonary disease.   Original Report Authenticated By: Charline Bills, M.D.      ED ECG REPORT  I personally interpreted this EKG   Date: 04/14/2012   Rate: 57  Rhythm: atrial flutter  QRS Axis: normal  Intervals: A-flutter  ST/T Wave abnormalities: nonspecific T wave changes  Conduction Disutrbances:none  Narrative Interpretation:   Old EKG Reviewed: unchanged    1. Chest pain    2. Heart block AV second degree       MDM  72 year old male with chest pain and past medical history of MI and stent. I discussed this patient with Dr. Deretha Emory, who is also personally seen the patient. We have consulted the patient's cardiologist, who will admit the patient for the chest pain. Additionally, while in the emergency department, the patient has had several episodes of bradycardia, where it appears that he's been dropping a beat indicative of second degree heart block. As the symptoms were intermittent do not give atropine. Patient has been reevaluated several times, and we have managed his pain with morphine.  Roxy Horseman, PA-C 04/14/12 1350

## 2012-04-14 NOTE — ED Provider Notes (Addendum)
Shelda Jakes, MD   Medical screening examination/treatment/procedure(s) were conducted as a shared visit with non-physician practitioner(s) and myself.  I personally evaluated the patient during the encounter   Patient status post stent in 1998. New type of left-sided midsternal chest pain associated with some shortness of breath that began this morning. Agent has some chronic left shoulder pain but this is different. Pain not completely resolved with nitroglycerin and morphine. But improved. Monitor in the ED showed a second-degree heart block at times it was intermittent at other times he is in a sinus rhythm. EKG without acute changes. Troponins negative. Patient's cardiologist is Dr. Jacinto Halim she will call for admission. Patient also with right lower extremity swelling d-dimer is pending but patient states that that leg is usually bigger than the left one due to circulation issues.      Shelda Jakes, MD 04/14/12 1214   Addendum: Discussed with patient's cardiologist she will mid to telemetry. Patient's d-dimer was elevated however was also elevated he ended January clinically do not think that this is related to a pulmonary embolism. Patient had back pain when they checked it in January. CT at that time was negative for pulmonary embolism did raise some concern about a mass. Cardiology aware.  Shelda Jakes, MD 04/14/12 [phone]

## 2012-04-15 LAB — CK TOTAL AND CKMB (NOT AT ARMC): Total CK: 114 U/L (ref 7–232)

## 2012-04-15 MED ORDER — HYDROCHLOROTHIAZIDE 12.5 MG PO TABS: 25.0000 mg | ORAL_TABLET | Freq: Every day | ORAL | Status: DC

## 2012-04-15 MED ORDER — LISINOPRIL 20 MG PO TABS: 40.0000 mg | ORAL_TABLET | Freq: Every day | ORAL | Status: DC

## 2012-04-15 NOTE — Progress Notes (Signed)
CRITICAL VALUE ALERT  Critical value received:  CK MB 6.1   Date of notification:  04/14/12  Time of notification:  0030  Critical value read back:yes  Nurse who received alert:  Nikki Dom RN  MD notified (1st page):  Dr. Jacinto Halim  Time of first page:  0200  MD notified (2nd page):  Time of second page:  Responding MD:  Dr. Jacinto Halim  Time MD responded:  [phone]

## 2012-04-15 NOTE — Progress Notes (Signed)
Pt provided with dc instructions and education. Pt verbalized understanding. Pt educated on new medications, changes in medications and which medications to no longer take. Pt teachback learnings. Pt denies CP/SOB at this time. IV removed with tip intact. Heart monitor cleaned and returned to front. Pt awaiting family to pick patient up. Levonne Spiller, RN

## 2012-04-15 NOTE — Discharge Summary (Addendum)
Physician Discharge Summary  Patient ID: [name] MRN: [id number] DOB/AGE: [birth year] 74 y.o.  Admit date: 04/14/2012 Discharge date: 04/15/2012  Primary Discharge Diagnosis Musculoskeletal chest pain Secondary Discharge Diagnosis Conduction system disease.  1st degree AV Block. Brief one episode Mobitz II AV block. CAD/ASHD MI and h/o Stenting in 1998 right coronary artery (40% ISR but patent) and also OM branch of circumflex coronary artery. Has a small D1 with high grade stenosis.  Out patient Nuclear stress test 5/10: Small Apical scar without ischemia. EF 59%. Low risk.  Hypertension  Obstructive sleep apnea unable to tolerate CPAP.  Chronic pain syndrome on chronic morphine. Lumbosacral disc disease. Contemplating spinal stimulator implant by Dr. Sheran Luz. I do not see any contraindication f will be no is that he begin 0 the or this. Even if he were to need a permanent pacemaker, we should be able to adjust the threshold.  Anxiety and depression. Elevated D-Dimer with negative CT angio 03/20/12. Probably non specific  Significant Diagnostic Studies: None  Consults: Curbside consult with Odessa Fleming regarding conduction system disease. Recommend observation after beta blocker discontinued.  Hospital Course:  patient presented with severe knifelike chest discomfort in the left upper part of his chest that lasted for 30 minutes to 45 minutes. He also had difficulty in breathing. He received 3 sublingual nitroglycerin with partial relief of chest discomfort by the EMS at home, was brought to the emergency room. His EKG essentially revealed no ischemic changes except for underlying first degree AV block and occasional PVC and frequent PACs. He was admitted overnight for observation, ruled out for myocardial infarction and the following day as he did not have any significant arrhythmias or high degree AV block, he was felt stable for discharge. I have discontinued his beta blocker. I  will see him back in the office in 2 weeks.   Discharge Exam: Blood pressure 143/81, pulse 52, temperature 97.8 F (36.6 C), temperature source Oral, resp. rate 16, height 5\' 10"  (1.778 m), weight 85.004 kg (187 lb 6.4 oz), SpO2 98.00%.   General appearance: alert, cooperative, appears stated age and no distress  Eyes: conjunctivae/corneas clear. PERRL, EOM's intact.  Neck: no adenopathy, no carotid bruit, no JVD, supple, symmetrical, trachea midline and thyroid not enlarged, symmetric, no tenderness/mass/nodules  Neck: JVP - normal, carotids 2+= without bruits  Resp: rales LLL  Chest wall: no tenderness  Cardio: regular rate and rhythm, S1, S2 normal, no murmur, click, rub or gallop  GI: soft, non-tender; bowel sounds normal; no masses, no organomegaly  Extremities: bilateral 1-2+ below knee pitting edema without any tenderness.  Pulses: 2+ and symmetric  Skin: Skin color, texture, turgor normal. No rashes or lesions  Neurologic: Grossly normal   Labs:   Lab Results  Component Value Date   WBC 8.5 04/14/2012   HGB 14.7 04/14/2012   HCT 41.5 04/14/2012   MCV 92.0 04/14/2012   PLT 186 04/14/2012    Recent Labs Lab 04/14/12 0957  NA 143  K 4.2  CL 104  CO2 27  BUN 15  CREATININE 1.05  CALCIUM 10.3  GLUCOSE 108*   Lab Results  Component Value Date   CKTOTAL 114 04/14/2012   CKMB 6.1* 04/14/2012   TROPONINI <0.30 04/14/2012    Lipid Panel     Component Value Date/Time   CHOL 114 03/28/2010 1147   TRIG 201.0* 03/28/2010 1147   HDL 25.20* 03/28/2010 1147   CHOLHDL 5 03/28/2010 1147   VLDL 40.2* 03/28/2010 1147  EKG: Sinus rhythm at rate of 56 bpm with first-degree AV block, left atrial abnormality, frequent PACs with compensated pause is without evidence of high degree AV block.  EKG 03/20/2012: Junctional escape rhythm. Office EKG 11/27/2011: Sinus bradycardia at a rate of 54 bpm with first degree AV block, left atrial enlargement, frequent PACs. ECG 08/29/11: Junctional escape  rhythm @ 54/min. Normal QRS duration. No ischemia. ECG 01/30/11: NSR, normal intervals. no ischemia.Compared to 09/26/10, A. Fibrillation no longer present.    Radiology: Ct Angio Chest Pe W/cm &/or Wo Cm  03/20/2012  *RADIOLOGY REPORT*  Clinical Data: Shortness of breath, hypertension, back pain, evaluate for pulmonary embolism  CT ANGIOGRAPHY CHEST    IMPRESSION:  1.  Negative for pulmonary embolism. 2.  Interval development of minimal heterogeneous slightly nodular air space opacities within the right lower lobe, possibly atelectasis though developing infection not excluded.  3.  Enlarged caliber of the main pulmonary artery, nonspecific but may be seen in the setting of pulmonary arterial hypertension. Further evaluation with cardiac echo may be performed as clinically indicated. 4.  Coronary artery calcifications.  5. Multilevel thoracic spine degenerative change.   Original Report Authenticated By: Tacey Ruiz, MD    Dg Chest Port 1 View  04/14/2012  *RADIOLOGY REPORT*  Clinical Data: Chest pain  PORTABLE CHEST - 1 VIEW  Comparison: 03/20/2012  Findings: Tortuous and unfolded thoracic aorta noted.  There is mild cardiac enlargement identified.  No effusions or edema identified.  No airspace consolidation.  IMPRESSION:  1.  Cardiac enlargement.   Original Report Authenticated By: Signa Kell, M.D.    Dg Chest Port 1 View  03/20/2012  *RADIOLOGY REPORT*  Clinical Data: Shortness of breath, dizziness  PORTABLE CHEST - 1 VIEW  Comparison: 09/09/2011  Findings: Lungs are essentially clear. No pleural effusion or pneumothorax.  Mild cardiomegaly.  Degenerative changes of the visualized thoracolumbar spine and bilateral shoulders.  IMPRESSION: No evidence of acute cardiopulmonary disease.   Original Report Authenticated By: Charline Bills, M.D.       FOLLOW UP PLANS AND APPOINTMENTS  Future Appointments Provider Department Dept Phone   04/24/2012 10:15 AM Stacie Glaze, MD [facility]  [place] [phone]       Medication List    STOP taking these medications       bisoprolol 5 MG tablet  Commonly known as:  ZEBETA      TAKE these medications       ALKA-SELTZER PLUS COLD PO  Take 1 tablet by mouth daily as needed. For indigestion     aspirin EC 81 MG tablet  Take 81 mg by mouth daily.     baclofen 10 MG tablet  Commonly known as:  LIORESAL  Take 1 tablet (10 mg total) by mouth 3 (three) times daily as needed.     DYMISTA 137-50 MCG/ACT Susp  Generic drug:  Azelastine-Fluticasone  Place into the nose 2 (two) times daily.     gabapentin 400 MG capsule  Commonly known as:  NEURONTIN  Take 400 mg by mouth 3 (three) times daily.     hydrochlorothiazide 12.5 MG tablet  Commonly known as:  HYDRODIURIL  Take 2 tablets (25 mg total) by mouth daily.     lisinopril 20 MG tablet  Commonly known as:  PRINIVIL,ZESTRIL  Take 2 tablets (40 mg total) by mouth daily.     morphine 30 MG 12 hr tablet  Commonly known as:  MS CONTIN  Take 1 tablet (30 mg  total) by mouth 3 (three) times daily.     omeprazole 20 MG capsule  Commonly known as:  PRILOSEC  Take 1 capsule (20 mg total) by mouth daily.     polyethylene glycol packet  Commonly known as:  MIRALAX / GLYCOLAX  Take 17 g by mouth daily as needed (constipation).     simvastatin 40 MG tablet  Commonly known as:  ZOCOR  Take 40 mg by mouth at bedtime.           Follow-up Information   Follow up with Pamella Pert, MD On 04/29/2012. (Arrive at 1:30 pm)    Contact information:   [address] [place] [address] [phone]        Pamella Pert, MD 04/15/2012, 9:15 AM  Pager: [phone] Office: [phone] If no answer: [phone]

## 2012-04-24 ENCOUNTER — Ambulatory Visit: Payer: Medicare Other | Admitting: Internal Medicine

## 2012-05-06 NOTE — Telephone Encounter (Signed)
error 

## 2012-05-11 ENCOUNTER — Telehealth: Payer: Self-pay | Admitting: Internal Medicine

## 2012-05-11 DIAGNOSIS — M549 Dorsalgia, unspecified: Secondary | ICD-10-CM

## 2012-05-11 MED ORDER — MORPHINE SULFATE ER 30 MG PO TBCR: 30.0000 mg | EXTENDED_RELEASE_TABLET | Freq: Three times a day (TID) | ORAL | Status: DC

## 2012-05-11 NOTE — Telephone Encounter (Signed)
He can call directly to reschedule

## 2012-05-11 NOTE — Telephone Encounter (Signed)
Can he call and schedule or does he need referral?

## 2012-05-11 NOTE — Telephone Encounter (Signed)
Pt had to cancelled sleep study that was sch for 10-2011. Pt would like another referral for sleep study test.

## 2012-05-11 NOTE — Telephone Encounter (Signed)
Left message on machine Call and reschedule sleep study--morphine script will be ready in am

## 2012-05-11 NOTE — Telephone Encounter (Signed)
Pt also needs new rx morphine

## 2012-05-14 ENCOUNTER — Telehealth: Payer: Self-pay | Admitting: Internal Medicine

## 2012-05-14 NOTE — Telephone Encounter (Signed)
Pt  Needs new rx morphine

## 2012-05-15 NOTE — Telephone Encounter (Signed)
Pt informed was told of 3-24 it was ready for pick up

## 2012-05-17 ENCOUNTER — Ambulatory Visit: Payer: Medicare Other

## 2012-05-17 ENCOUNTER — Ambulatory Visit (INDEPENDENT_AMBULATORY_CARE_PROVIDER_SITE_OTHER): Payer: Medicare Other | Admitting: Internal Medicine

## 2012-05-17 VITALS — BP 158/80 | HR 65 | Temp 97.9°F | Resp 30

## 2012-05-17 DIAGNOSIS — R42 Dizziness and giddiness: Secondary | ICD-10-CM

## 2012-05-17 DIAGNOSIS — R0602 Shortness of breath: Secondary | ICD-10-CM

## 2012-05-17 DIAGNOSIS — R079 Chest pain, unspecified: Secondary | ICD-10-CM

## 2012-05-17 LAB — POCT CBC
Lymph, poc: 1.7 (ref 0.6–3.4)
MCH, POC: 31.9 pg — AB (ref 27–31.2)
MCHC: 34 g/dL (ref 31.8–35.4)
MCV: 94 fL (ref 80–97)
MID (cbc): 0.5 (ref 0–0.9)
MPV: 6.2 fL (ref 0–99.8)
POC LYMPH PERCENT: 16.2 %L (ref 10–50)
POC MID %: 4.5 %M (ref 0–12)
Platelet Count, POC: 273 10*3/uL (ref 142–424)
RBC: 4.45 M/uL — AB (ref 4.69–6.13)
RDW, POC: 14.5 %
WBC: 10.6 10*3/uL — AB (ref 4.6–10.2)

## 2012-05-17 LAB — COMPREHENSIVE METABOLIC PANEL
ALT: 16 U/L (ref 0–53)
AST: 20 U/L (ref 0–37)
Alkaline Phosphatase: 118 U/L — ABNORMAL HIGH (ref 39–117)
BUN: 16 mg/dL (ref 6–23)
Calcium: 9.6 mg/dL (ref 8.4–10.5)
Creat: 1.09 mg/dL (ref 0.50–1.35)
Total Bilirubin: 0.6 mg/dL (ref 0.3–1.2)

## 2012-05-17 NOTE — Progress Notes (Signed)
  Subjective:    Patient ID: [name], male    DOB: 19-Mar-1940, 72 y.o.   MRN: [id number]  HPI see patient emergently due to shortness of breath with dizziness and chest pain For the past 24 hours he has noticed chest pressure and pain associated with burning sensations in his epigastrium/he also feels short of breath even at rest/there was no paroxysmal nocturnal dyspnea last night/he denies palpitations In February he was thought to have a pneumonia creating heart rhythm difficulties/he felt better after treatment with antibiotics/his problems have recurred though and he has been admitted to Dr Jacinto Halim with a cardiac rhythm abnormality/see discharge note  He apparently has seen his doctor J. Little since his last visit with Dr. Jacinto Halim and was started on digoxin, restarted on Lopressor, and started on Reglan.  Past medical history includes chronic pain disorder since multiple knee surgeries when he had an infection following a replacement a few years ago He also has had panic disorder since that time He has known gastric reflux/history of peptic ulcer   Review of Systems No fever chills or night sweats No real cough No recent weight loss    Objective:   Physical Exam BP 158/80  Pulse 65  Temp(Src) 97.9 F (36.6 C) (Oral)  Resp 30  SpO2 98% He is alert and oriented although tired HEENT clear Lungs clear to auscultation except diminished breath sounds at both bases Heart is slow and irregularly irregular/no murmurs Abdomen supple No edema or jugular venous distention No orthopnea during exam Pulse ox 98%      [facility] reading (PRIMARY) by  Dr. Merla Riches no infiltrates that are active/no effusions No cardiomegaly. Tortuous aorta as before EKG= very tiny P waves/probable atrial tachycardia with irregular conduction/intermittent block producing bradycardia and pauses of up to 2 seconds/no abnormality of ST or T waves and no new Q waves  Assessment & Plan:  Problem #1 shortness  of breath Problem #2 chest pain Problem #3 epigastric distress Problem #4 unstable arrhythmia/irregular conduction status beats creating bradycardia  Stop digoxin Stop Lopressor Stop Reglan Stop Pradexa I called and discussed this with Dr. Jacinto Halim who will arrange his followup tomorrow since there is no acute distress and since there is no injury to the myocardium seen on EKG in this patient that is well known to him To followup with Dr. Jacinto Halim tomorrow for pacemaker/return to [place] or to the ER if worse in the meantime

## 2012-05-18 ENCOUNTER — Emergency Department (HOSPITAL_COMMUNITY): Payer: Medicare Other

## 2012-05-18 ENCOUNTER — Emergency Department (HOSPITAL_COMMUNITY)
Admission: EM | Admit: 2012-05-18 | Discharge: 2012-05-18 | Disposition: A | Discharge status: Home / Self Care | Payer: Medicare Other | Attending: Emergency Medicine | Admitting: Emergency Medicine

## 2012-05-18 ENCOUNTER — Encounter (HOSPITAL_COMMUNITY): Payer: Self-pay | Admitting: *Deleted

## 2012-05-18 DIAGNOSIS — I252 Old myocardial infarction: Secondary | ICD-10-CM | POA: Insufficient documentation

## 2012-05-18 DIAGNOSIS — I10 Essential (primary) hypertension: Secondary | ICD-10-CM | POA: Insufficient documentation

## 2012-05-18 DIAGNOSIS — F411 Generalized anxiety disorder: Secondary | ICD-10-CM | POA: Insufficient documentation

## 2012-05-18 DIAGNOSIS — Z8701 Personal history of pneumonia (recurrent): Secondary | ICD-10-CM | POA: Insufficient documentation

## 2012-05-18 DIAGNOSIS — Z7982 Long term (current) use of aspirin: Secondary | ICD-10-CM | POA: Insufficient documentation

## 2012-05-18 DIAGNOSIS — G4730 Sleep apnea, unspecified: Secondary | ICD-10-CM | POA: Insufficient documentation

## 2012-05-18 DIAGNOSIS — Z8679 Personal history of other diseases of the circulatory system: Secondary | ICD-10-CM | POA: Insufficient documentation

## 2012-05-18 DIAGNOSIS — Z79899 Other long term (current) drug therapy: Secondary | ICD-10-CM | POA: Insufficient documentation

## 2012-05-18 DIAGNOSIS — Z862 Personal history of diseases of the blood and blood-forming organs and certain disorders involving the immune mechanism: Secondary | ICD-10-CM | POA: Insufficient documentation

## 2012-05-18 DIAGNOSIS — Z87448 Personal history of other diseases of urinary system: Secondary | ICD-10-CM | POA: Insufficient documentation

## 2012-05-18 DIAGNOSIS — F329 Major depressive disorder, single episode, unspecified: Secondary | ICD-10-CM | POA: Insufficient documentation

## 2012-05-18 DIAGNOSIS — E785 Hyperlipidemia, unspecified: Secondary | ICD-10-CM | POA: Insufficient documentation

## 2012-05-18 DIAGNOSIS — Z8719 Personal history of other diseases of the digestive system: Secondary | ICD-10-CM | POA: Insufficient documentation

## 2012-05-18 DIAGNOSIS — R079 Chest pain, unspecified: Secondary | ICD-10-CM

## 2012-05-18 DIAGNOSIS — Z87891 Personal history of nicotine dependence: Secondary | ICD-10-CM | POA: Insufficient documentation

## 2012-05-18 DIAGNOSIS — Z8711 Personal history of peptic ulcer disease: Secondary | ICD-10-CM | POA: Insufficient documentation

## 2012-05-18 DIAGNOSIS — F3289 Other specified depressive episodes: Secondary | ICD-10-CM | POA: Insufficient documentation

## 2012-05-18 DIAGNOSIS — Z860100 Personal history of colon polyps, unspecified: Secondary | ICD-10-CM | POA: Insufficient documentation

## 2012-05-18 DIAGNOSIS — Z9861 Coronary angioplasty status: Secondary | ICD-10-CM | POA: Insufficient documentation

## 2012-05-18 DIAGNOSIS — I2510 Atherosclerotic heart disease of native coronary artery without angina pectoris: Secondary | ICD-10-CM | POA: Insufficient documentation

## 2012-05-18 DIAGNOSIS — Z8739 Personal history of other diseases of the musculoskeletal system and connective tissue: Secondary | ICD-10-CM | POA: Insufficient documentation

## 2012-05-18 LAB — COMPREHENSIVE METABOLIC PANEL WITH GFR
ALT: 15 U/L (ref 0–53)
AST: 19 U/L (ref 0–37)
Albumin: 3.4 g/dL — ABNORMAL LOW (ref 3.5–5.2)
Alkaline Phosphatase: 124 U/L — ABNORMAL HIGH (ref 39–117)
BUN: 16 mg/dL (ref 6–23)
CO2: 32 meq/L (ref 19–32)
Calcium: 9.5 mg/dL (ref 8.4–10.5)
Chloride: 91 meq/L — ABNORMAL LOW (ref 96–112)
Creatinine, Ser: 1.08 mg/dL (ref 0.50–1.35)
GFR calc Af Amer: 77 mL/min — ABNORMAL LOW (ref >90)
GFR calc non Af Amer: 66 mL/min — ABNORMAL LOW (ref >90)
Glucose, Bld: 104 mg/dL — ABNORMAL HIGH (ref 70–99)
Potassium: 4.2 meq/L (ref 3.5–5.1)
Sodium: 131 meq/L — ABNORMAL LOW (ref 135–145)
Total Bilirubin: 0.3 mg/dL (ref 0.3–1.2)
Total Protein: 6.7 g/dL (ref 6.0–8.3)

## 2012-05-18 LAB — POCT I-STAT, CHEM 8
BUN: 16 mg/dL (ref 6–23)
Calcium, Ion: 1.18 mmol/L (ref 1.13–1.30)
Chloride: 91 meq/L — ABNORMAL LOW (ref 96–112)
Creatinine, Ser: 1.1 mg/dL (ref 0.50–1.35)
Glucose, Bld: 106 mg/dL — ABNORMAL HIGH (ref 70–99)
HCT: 40 % (ref 39.0–52.0)
Hemoglobin: 13.6 g/dL (ref 13.0–17.0)
Potassium: 4.1 meq/L (ref 3.5–5.1)
Sodium: 130 meq/L — ABNORMAL LOW (ref 135–145)
TCO2: 31 mmol/L (ref 0–100)

## 2012-05-18 LAB — URINALYSIS, ROUTINE W REFLEX MICROSCOPIC
Bilirubin Urine: NEGATIVE
Glucose, UA: NEGATIVE mg/dL
Hgb urine dipstick: NEGATIVE
Ketones, ur: NEGATIVE mg/dL
Leukocytes, UA: NEGATIVE
Protein, ur: NEGATIVE mg/dL

## 2012-05-18 LAB — CBC
HCT: 36.9 % — ABNORMAL LOW (ref 39.0–52.0)
Hemoglobin: 13.5 g/dL (ref 13.0–17.0)
MCH: 31.9 pg (ref 26.0–34.0)
MCHC: 36.6 g/dL — ABNORMAL HIGH (ref 30.0–36.0)
MCV: 87.2 fL (ref 78.0–100.0)
Platelets: 196 K/uL (ref 150–400)
RBC: 4.23 MIL/uL (ref 4.22–5.81)
RDW: 13.6 % (ref 11.5–15.5)
WBC: 10 K/uL (ref 4.0–10.5)

## 2012-05-18 LAB — LIPASE, BLOOD: Lipase: 27 U/L (ref 11–59)

## 2012-05-18 LAB — POCT I-STAT TROPONIN I: Troponin i, poc: 0.01 ng/mL (ref 0.00–0.08)

## 2012-05-18 NOTE — ED Notes (Signed)
[facility] tray ordered spoke with [name]

## 2012-05-18 NOTE — ED Notes (Signed)
PT called EMS after having CP that was unrelieved with 1 NGT. PT reported CP started at 1015 this AM and SHOB . Pt has known HX of A-FIB and was seen on Sunday at [facility] for evaluation of same SX's. Pt reported MD told him he may need a Visual merchandiser because his HR was in 30's while at [facility]gent care.

## 2012-05-18 NOTE — ED Provider Notes (Signed)
History     CSN: [id number]  Arrival date & time 05/18/12  1158   First MD Initiated Contact with Patient 05/18/12 1218      Chief Complaint  Patient presents with  . Chest Pain    (Consider location/radiation/quality/duration/timing/severity/associated sxs/prior treatment) Patient is a 72 y.o. male presenting with chest pain. The history is provided by the patient and the spouse.  Chest Pain  patient here complaining of midepigastric discomfort that felt like his usual indigestion that started after he breakfast. Did not have any associated diaphoresis or dyspnea. Took baking soda and does so much better. Also belched and passed flatus. States became very anxious and this is confirmed by his wife. Was seen at urgent care Center yesterday for similar symptoms and has followup scheduled with his cardiologist. Called EMS after speaking with his cardiologist prior to arrival. EMS gave the patient nitroglycerin which had no effect on his symptoms. He is currently asymptomatic at this time. He feels back to his baseline.  Past Medical History  Diagnosis Date  . Hypertension   . Hyperlipidemia   . Preoperative cardiovascular examination   . Spinal stenosis, lumbar   . CAS (cerebral atherosclerosis)   . Erectile dysfunction   . Preventative health care   . Benign prostatic hypertrophy with urinary obstruction   . Family history of early CAD     male 1st degree relative <50  . Peptic ulcer disease   . Low back pain   . Superficial spreading melanoma   . Sleep apnea, obstructive     CPAP-does not use  . HA (headache)     sinus headaches  . Arthritis     knees  . Myocardial infarction 1997    Hx MI 1997 and 1998  . Anxiety   . Pneumonia 2005  . Anemia   . Depression     takes Zoloft  . Neuropathy, autonomic, idiopathic peripheral, other   . Blood transfusion 1 yr. ago    jerking,fever-after blood transfusion  . Blood transfusion     given wrong blood  . Coronary artery  disease 1998    cardiac stent/ Clearance Dr Lovell Sheehan and Jacinto Halim with note on chart  . Dysrhythmia     hx of atrial fib per ov note of 12/12   . Bradycardia     hx of bradycardia in past per ov note of 12/12  . GERD (gastroesophageal reflux disease)     Past Surgical History  Procedure Laterality Date  . Esophagogastroduodenoscopy  2004  . Colonoscopy  2004  . Lumbar laminectomy    . Lumbar fusion    . Knee arthroscopy      left  . Shoulder arthroscopy      right  . Knee arthroscopy      right  . Decompression of the median nerve      left wrist and hand  . Revision of tkr  2011    on left  . Coronary stent placement    . Back surgery  2005-last    lumbar x2  . Excisional total knee arthroplasty  12/25/2010    Procedure: EXCISIONAL TOTAL KNEE ARTHROPLASTY;  Surgeon: Shelda Pal;  Location: [facility];  Service: Orthopedics;  Laterality: Right;  repeat irrigation   and debridement, removal and reinsertation of spacer block  . Joint replacement  2011    left knee x 2 ; 08/2010-right knee-infected  . I&d extremity  03/25/2011    Procedure: IRRIGATION AND DEBRIDEMENT EXTREMITY;  Surgeon: Shelda Pal, MD;  Location: [facility];  Service: Orthopedics;  Laterality: Right;  . Coronary angioplasty  1998    stents 1998   . Total knee revision  05/13/2011    Procedure: TOTAL KNEE REVISION;  Surgeon: Shelda Pal, MD;  Location: [facility];  Service: Orthopedics;  Laterality: Right;  Reimplantation    No family history on file.  History  Substance Use Topics  . Smoking status: Former Smoker -- 1.00 packs/day for 15 years    Types: Cigars    Quit date: 12/23/2009  . Smokeless tobacco: Never Used     Comment: smoked a pipe for 6-7 y ears  . Alcohol Use: No      Review of Systems  Cardiovascular: Positive for chest pain.  All other systems reviewed and are negative.    Allergies  Acetaminophen  Home Medications   Current Outpatient Rx  Name  Route  Sig  Dispense  Refill  .  ALPRAZolam (XANAX) 0.25 MG tablet   Oral   Take 0.25 mg by mouth 3 (three) times daily.         Marland Kitchen aspirin EC 81 MG tablet   Oral   Take 81 mg by mouth daily.         . Azelastine-Fluticasone (DYMISTA) 137-50 MCG/ACT SUSP   Nasal   Place into the nose 2 (two) times daily.         . baclofen (LIORESAL) 10 MG tablet   Oral   Take 1 tablet (10 mg total) by mouth 3 (three) times daily as needed.   90 each   3   . ferrous sulfate 325 (65 FE) MG tablet   Oral   Take 325 mg by mouth daily with breakfast.         . gabapentin (NEURONTIN) 400 MG capsule   Oral   Take 400 mg by mouth 3 (three) times daily.         . hydrochlorothiazide (HYDRODIURIL) 25 MG tablet   Oral   Take 25 mg by mouth daily.         Marland Kitchen lisinopril (PRINIVIL,ZESTRIL) 20 MG tablet   Oral   Take 2 tablets (40 mg total) by mouth daily.   30 tablet   2   . morphine (MS CONTIN) 30 MG 12 hr tablet   Oral   Take 1 tablet (30 mg total) by mouth 3 (three) times daily.   90 tablet   0   . simvastatin (ZOCOR) 40 MG tablet   Oral   Take 40 mg by mouth at bedtime.           BP 137/64  Pulse 51  Temp(Src) 97.8 F (36.6 C) (Oral)  Resp 20  SpO2 95%  Physical Exam  Nursing note and vitals reviewed. Constitutional: He is oriented to person, place, and time. He appears well-developed and well-nourished.  Non-toxic appearance. No distress.  HENT:  Head: Normocephalic and atraumatic.  Eyes: Conjunctivae, EOM and lids are normal. Pupils are equal, round, and reactive to light.  Neck: Normal range of motion. Neck supple. No tracheal deviation present. No mass present.  Cardiovascular: Normal rate, regular rhythm and normal heart sounds.  Exam reveals no gallop.   No murmur heard. Pulmonary/Chest: Effort normal and breath sounds normal. No stridor. No respiratory distress. He has no decreased breath sounds. He has no wheezes. He has no rhonchi. He has no rales.  Abdominal: Soft. Normal appearance and  bowel sounds are normal.  He exhibits no distension. There is no tenderness. There is no rebound and no CVA tenderness.  Musculoskeletal: Normal range of motion. He exhibits no edema and no tenderness.  Neurological: He is alert and oriented to person, place, and time. He has normal strength. No cranial nerve deficit or sensory deficit. GCS eye subscore is 4. GCS verbal subscore is 5. GCS motor subscore is 6.  Skin: Skin is warm and dry. No abrasion and no rash noted.  Psychiatric: He has a normal mood and affect. His speech is normal and behavior is normal.    ED Course  Procedures (including critical care time)  Labs Reviewed  CBC  COMPREHENSIVE METABOLIC PANEL  URINALYSIS, ROUTINE W REFLEX MICROSCOPIC  LIPASE, BLOOD   Dg Chest 2 View  05/17/2012  *RADIOLOGY REPORT*  Clinical Data: Shortness of breath.  Lightheadedness.  CHEST - 2 VIEW  Comparison: 04/14/2012.  Findings: There is stable mild cardiac enlargement and aortic tortuosity.  The lungs are clear.  There is no pleural effusion or pneumothorax.  There are postsurgical changes consistent with distal clavicle resection on the right.  The subacromial space of both shoulders is narrowed consistent with chronic rotator cuff tears.  IMPRESSION: No acute cardiopulmonary process.  Stable cardiomegaly.   Original Report Authenticated By: Carey Bullocks, M.D.      No diagnosis found.    MDM   Date: 05/18/2012  Rate: 78  Rhythm: normal sinus rhythm  QRS Axis: left  Intervals: normal  ST/T Wave abnormalities: nonspecific ST changes  Conduction Disutrbances:first-degree A-V block   Narrative Interpretation:   Old EKG Reviewed: unchanged  3:29 PM Spoke with Dr. Nadara Eaton,  Agrees with plan to cycle cardiac enzymes.. Patient did have one episode of bradycardia which Dr. Nadara Eaton is well aware of. Patient has been scheduled to see an ep specialist. for pacemaker placement. His blood pressure was stable with that. He was  asymptomatic        Toy Baker, MD 05/18/12 1530

## 2012-05-18 NOTE — ED Notes (Signed)
Patient is in xray.  Will request urine on his return.

## 2012-05-24 ENCOUNTER — Ambulatory Visit (HOSPITAL_BASED_OUTPATIENT_CLINIC_OR_DEPARTMENT_OTHER): Payer: Medicare Other | Attending: Internal Medicine

## 2012-05-24 VITALS — Ht 70.0 in | Wt 194.0 lb

## 2012-05-24 DIAGNOSIS — G4733 Obstructive sleep apnea (adult) (pediatric): Secondary | ICD-10-CM | POA: Insufficient documentation

## 2012-05-24 DIAGNOSIS — I491 Atrial premature depolarization: Secondary | ICD-10-CM | POA: Insufficient documentation

## 2012-05-24 DIAGNOSIS — G4737 Central sleep apnea in conditions classified elsewhere: Secondary | ICD-10-CM | POA: Insufficient documentation

## 2012-05-24 DIAGNOSIS — I4949 Other premature depolarization: Secondary | ICD-10-CM | POA: Insufficient documentation

## 2012-05-28 ENCOUNTER — Encounter: Payer: Self-pay | Admitting: Physical Medicine & Rehabilitation

## 2012-06-02 ENCOUNTER — Telehealth: Payer: Self-pay | Admitting: Internal Medicine

## 2012-06-02 MED ORDER — MORPHINE SULFATE 15 MG PO TABS: ORAL_TABLET | ORAL | Status: DC

## 2012-06-02 NOTE — Telephone Encounter (Signed)
Patient called stating that he need a refill of his morphine 15 mg break through. Please assist.

## 2012-06-02 NOTE — Telephone Encounter (Signed)
Printed and will call pt for pick up after dr Lovell Sheehan signs

## 2012-06-04 DIAGNOSIS — G4710 Hypersomnia, unspecified: Secondary | ICD-10-CM

## 2012-06-04 DIAGNOSIS — G4730 Sleep apnea, unspecified: Secondary | ICD-10-CM

## 2012-06-05 NOTE — Procedures (Signed)
NAME:  [name], [name] NO.:  [id number]  MEDICAL RECORD NO.:  [id number]          PATIENT TYPE:  OUT  LOCATION:  SLEEP CENTER                 FACILITY:  [facility]  PHYSICIAN:  Barbaraann Share, MD,FCCPDATE OF BIRTH:  October 15[birth year] 1938  DATE OF STUDY:  05/24/2012                           NOCTURNAL POLYSOMNOGRAM  REFERRING PHYSICIAN:  Stacie Glaze, MD  INDICATION FOR STUDY:  Hypersomnia with sleep apnea.  EPWORTH SLEEPINESS SCORE:  10.  MEDICATIONS:  SLEEP ARCHITECTURE:  The patient had a total sleep time of 271 minute with no slow-wave sleep and only 15 minutes of REM.  Sleep onset latency was normal at 23 minutes, and REM onset was prolonged at 208 minutes. Sleep efficiency was moderately reduced at 76%.  RESPIRATORY DATA:  The patient was found to have 31 obstructive apneas, 33 central apneas, and 81 obstructive hypopneas, giving him an apnea- hypopnea index of 32 events per hour.  The events occurred all in the supine position, and there was moderate snoring noted throughout.  OXYGEN DATA:  There was O2 desaturation as low as 83% with the patient's obstructive events.  CARDIAC DATA:  Frequent PAC as well as occasional PVC noted throughout the night.  MOVEMENT-PARASOMNIA:  The patient had no significant leg jerks or other abnormal behaviors seen.  IMPRESSIONS-RECOMMENDATIONS: 1. Moderate obstructive greater than central sleep apnea syndrome,     with an AHI of 32 events per hour and oxygen desaturation as low as     83%.  Treatment for this degree of sleep apnea can include a trial     of modest weight loss, upper airway surgery, dental appliance, and     also CPAP.  Clinical correlation is suggested.  If the decision is     made to treat the patient with CPAP, he may need a formal CPAP     titration in the Sleep Center     because of the complex nature of his sleep apnea.  Clinical     correlation is suggested. 2. Frequent premature atrial contraction with  occasional premature     ventricular contraction.     Barbaraann Share, MD,FCCP Diplomate, American Board of Sleep Medicine    [facility]/MED[facility]  D:  06/04/2012 14:25:01  T:  06/05/2012 00:38:33  Job:  [id number]

## 2012-06-08 ENCOUNTER — Encounter: Payer: Self-pay | Admitting: Internal Medicine

## 2012-06-09 ENCOUNTER — Telehealth: Payer: Self-pay | Admitting: Internal Medicine

## 2012-06-09 DIAGNOSIS — M549 Dorsalgia, unspecified: Secondary | ICD-10-CM

## 2012-06-09 MED ORDER — MORPHINE SULFATE ER 30 MG PO TBCR: 30.0000 mg | EXTENDED_RELEASE_TABLET | Freq: Three times a day (TID) | ORAL | Status: DC

## 2012-06-09 NOTE — Telephone Encounter (Signed)
Printed and will call pt to pick up. After dr Lovell Sheehan signs

## 2012-06-09 NOTE — Telephone Encounter (Signed)
Patient called stating that he need a refill of his Morphine 30 mg slow release 1po tid prn for pain. Please assist.

## 2012-06-16 ENCOUNTER — Telehealth: Payer: Self-pay | Admitting: Internal Medicine

## 2012-06-16 NOTE — Telephone Encounter (Signed)
Talked with wife and let her know her first step is [facility] and talk with them and they will guide her as to what to do

## 2012-06-16 NOTE — Telephone Encounter (Signed)
Pt would like to have rx for power chair scooter due to legs are getting weaker

## 2012-06-18 ENCOUNTER — Other Ambulatory Visit: Payer: Self-pay | Admitting: *Deleted

## 2012-06-18 MED ORDER — AZELASTINE-FLUTICASONE 137-50 MCG/ACT NA SUSP: 2.0000 | Freq: Two times a day (BID) | NASAL | Status: DC

## 2012-06-20 ENCOUNTER — Emergency Department (HOSPITAL_BASED_OUTPATIENT_CLINIC_OR_DEPARTMENT_OTHER): Payer: Medicare Other

## 2012-06-20 ENCOUNTER — Encounter (HOSPITAL_BASED_OUTPATIENT_CLINIC_OR_DEPARTMENT_OTHER): Payer: Self-pay | Admitting: *Deleted

## 2012-06-20 ENCOUNTER — Other Ambulatory Visit: Payer: Self-pay

## 2012-06-20 ENCOUNTER — Emergency Department (HOSPITAL_BASED_OUTPATIENT_CLINIC_OR_DEPARTMENT_OTHER)
Admission: EM | Admit: 2012-06-20 | Discharge: 2012-06-20 | Disposition: A | Discharge status: Home / Self Care | Payer: Medicare Other | Attending: Emergency Medicine | Admitting: Emergency Medicine

## 2012-06-20 DIAGNOSIS — Z8679 Personal history of other diseases of the circulatory system: Secondary | ICD-10-CM | POA: Insufficient documentation

## 2012-06-20 DIAGNOSIS — Z87891 Personal history of nicotine dependence: Secondary | ICD-10-CM | POA: Insufficient documentation

## 2012-06-20 DIAGNOSIS — G4730 Sleep apnea, unspecified: Secondary | ICD-10-CM | POA: Insufficient documentation

## 2012-06-20 DIAGNOSIS — Z8739 Personal history of other diseases of the musculoskeletal system and connective tissue: Secondary | ICD-10-CM | POA: Insufficient documentation

## 2012-06-20 DIAGNOSIS — F329 Major depressive disorder, single episode, unspecified: Secondary | ICD-10-CM | POA: Insufficient documentation

## 2012-06-20 DIAGNOSIS — K219 Gastro-esophageal reflux disease without esophagitis: Secondary | ICD-10-CM | POA: Insufficient documentation

## 2012-06-20 DIAGNOSIS — I2510 Atherosclerotic heart disease of native coronary artery without angina pectoris: Secondary | ICD-10-CM | POA: Insufficient documentation

## 2012-06-20 DIAGNOSIS — I10 Essential (primary) hypertension: Secondary | ICD-10-CM | POA: Insufficient documentation

## 2012-06-20 DIAGNOSIS — F411 Generalized anxiety disorder: Secondary | ICD-10-CM | POA: Insufficient documentation

## 2012-06-20 DIAGNOSIS — D649 Anemia, unspecified: Secondary | ICD-10-CM | POA: Insufficient documentation

## 2012-06-20 DIAGNOSIS — Z0000 Encounter for general adult medical examination without abnormal findings: Secondary | ICD-10-CM | POA: Insufficient documentation

## 2012-06-20 DIAGNOSIS — Z8669 Personal history of other diseases of the nervous system and sense organs: Secondary | ICD-10-CM | POA: Insufficient documentation

## 2012-06-20 DIAGNOSIS — E871 Hypo-osmolality and hyponatremia: Secondary | ICD-10-CM | POA: Insufficient documentation

## 2012-06-20 DIAGNOSIS — Z7982 Long term (current) use of aspirin: Secondary | ICD-10-CM | POA: Insufficient documentation

## 2012-06-20 DIAGNOSIS — Z85820 Personal history of malignant melanoma of skin: Secondary | ICD-10-CM | POA: Insufficient documentation

## 2012-06-20 DIAGNOSIS — R0989 Other specified symptoms and signs involving the circulatory and respiratory systems: Secondary | ICD-10-CM | POA: Insufficient documentation

## 2012-06-20 DIAGNOSIS — E785 Hyperlipidemia, unspecified: Secondary | ICD-10-CM | POA: Insufficient documentation

## 2012-06-20 DIAGNOSIS — I252 Old myocardial infarction: Secondary | ICD-10-CM | POA: Insufficient documentation

## 2012-06-20 DIAGNOSIS — R0609 Other forms of dyspnea: Secondary | ICD-10-CM | POA: Insufficient documentation

## 2012-06-20 DIAGNOSIS — Z8711 Personal history of peptic ulcer disease: Secondary | ICD-10-CM | POA: Insufficient documentation

## 2012-06-20 DIAGNOSIS — F3289 Other specified depressive episodes: Secondary | ICD-10-CM | POA: Insufficient documentation

## 2012-06-20 DIAGNOSIS — Z8701 Personal history of pneumonia (recurrent): Secondary | ICD-10-CM | POA: Insufficient documentation

## 2012-06-20 DIAGNOSIS — G8929 Other chronic pain: Secondary | ICD-10-CM | POA: Insufficient documentation

## 2012-06-20 DIAGNOSIS — Z79899 Other long term (current) drug therapy: Secondary | ICD-10-CM | POA: Insufficient documentation

## 2012-06-20 DIAGNOSIS — R0600 Dyspnea, unspecified: Secondary | ICD-10-CM

## 2012-06-20 DIAGNOSIS — Z87448 Personal history of other diseases of urinary system: Secondary | ICD-10-CM | POA: Insufficient documentation

## 2012-06-20 LAB — COMPREHENSIVE METABOLIC PANEL
AST: 33 U/L (ref 0–37)
Albumin: 4.1 g/dL (ref 3.5–5.2)
Alkaline Phosphatase: 137 U/L — ABNORMAL HIGH (ref 39–117)
BUN: 20 mg/dL (ref 6–23)
CO2: 26 mEq/L (ref 19–32)
Chloride: 85 mEq/L — ABNORMAL LOW (ref 96–112)
Creatinine, Ser: 1.1 mg/dL (ref 0.50–1.35)
GFR calc non Af Amer: 65 mL/min — ABNORMAL LOW (ref >90)
Potassium: 3.9 mEq/L (ref 3.5–5.1)
Total Bilirubin: 0.5 mg/dL (ref 0.3–1.2)

## 2012-06-20 LAB — CBC WITH DIFFERENTIAL/PLATELET
HCT: 41.4 % (ref 39.0–52.0)
Hemoglobin: 15.3 g/dL (ref 13.0–17.0)
Lymphocytes Relative: 25 % (ref 12–46)
Monocytes Absolute: 0.8 10*3/uL (ref 0.1–1.0)
Monocytes Relative: 8 % (ref 3–12)
Neutro Abs: 7 10*3/uL (ref 1.7–7.7)
Neutrophils Relative %: 66 % (ref 43–77)
RBC: 4.71 MIL/uL (ref 4.22–5.81)
WBC: 10.7 10*3/uL — ABNORMAL HIGH (ref 4.0–10.5)

## 2012-06-20 MED ORDER — SODIUM CHLORIDE 0.9 % IV BOLUS (SEPSIS)
1000.0000 mL | Freq: Once | INTRAVENOUS | Status: AC
  Administered 2012-06-20: 1000 mL via INTRAVENOUS

## 2012-06-20 MED ORDER — MORPHINE SULFATE 4 MG/ML IJ SOLN
4.0000 mg | Freq: Once | INTRAMUSCULAR | Status: AC
  Administered 2012-06-20: 4 mg via INTRAVENOUS
  Filled 2012-06-20: qty 1

## 2012-06-20 NOTE — ED Provider Notes (Signed)
MSE was initiated and I personally evaluated the patient and placed orders (if any) at  6:50 AM on Jun 20, 2012.  The patient appears stable so that the remainder of the MSE may be completed by another provider.  Ethelda Chick, MD 06/20/12 [phone]

## 2012-06-20 NOTE — ED Provider Notes (Signed)
History     CSN: [id number]  Arrival date & time 06/20/12  0626   First MD Initiated Contact with Patient 06/20/12 [phone]      Chief Complaint  Patient presents with  . Shortness of Breath    (Consider location/radiation/quality/duration/timing/severity/associated sxs/prior treatment) Patient is a 72 y.o. male presenting with shortness of breath.  Shortness of Breath  Pt with history of chronic chest and back pain, reports he woke up this morning with 'chest congestion'. States he has a burning sensation in his chest and mild SOB. Better since he got up and rode over here in the car. He has had several recent ED visits for similar. He also reports he was seen by PCP (Dr. Clarene Duke in [place]) a couple of weeks ago and treated for pneumonia although no CXR was done. Chest pain is a burning upper pain, similar to previous. No diaphoresis or vomiting. No fever, occasional cough.   Past Medical History  Diagnosis Date  . Hypertension   . Hyperlipidemia   . Preoperative cardiovascular examination   . Spinal stenosis, lumbar   . CAS (cerebral atherosclerosis)   . Erectile dysfunction   . Preventative health care   . Benign prostatic hypertrophy with urinary obstruction   . Family history of early CAD     male 1st degree relative <50  . Peptic ulcer disease   . Low back pain   . Superficial spreading melanoma   . Sleep apnea, obstructive     CPAP-does not use  . HA (headache)     sinus headaches  . Arthritis     knees  . Myocardial infarction 1997    Hx MI 1997 and 1998  . Anxiety   . Pneumonia 2005  . Anemia   . Depression     takes Zoloft  . Neuropathy, autonomic, idiopathic peripheral, other   . Blood transfusion 1 yr. ago    jerking,fever-after blood transfusion  . Blood transfusion     given wrong blood  . Coronary artery disease 1998    cardiac stent/ Clearance Dr Lovell Sheehan and Jacinto Halim with note on chart  . Dysrhythmia     hx of atrial fib per ov note of 12/12   .  Bradycardia     hx of bradycardia in past per ov note of 12/12  . GERD (gastroesophageal reflux disease)     Past Surgical History  Procedure Laterality Date  . Esophagogastroduodenoscopy  2004  . Colonoscopy  2004  . Lumbar laminectomy    . Lumbar fusion    . Knee arthroscopy      left  . Shoulder arthroscopy      right  . Knee arthroscopy      right  . Decompression of the median nerve      left wrist and hand  . Revision of tkr  2011    on left  . Coronary stent placement    . Back surgery  2005-last    lumbar x2  . Excisional total knee arthroplasty  12/25/2010    Procedure: EXCISIONAL TOTAL KNEE ARTHROPLASTY;  Surgeon: Shelda Pal;  Location: [facility];  Service: Orthopedics;  Laterality: Right;  repeat irrigation   and debridement, removal and reinsertation of spacer block  . Joint replacement  2011    left knee x 2 ; 08/2010-right knee-infected  . I&d extremity  03/25/2011    Procedure: IRRIGATION AND DEBRIDEMENT EXTREMITY;  Surgeon: Shelda Pal, MD;  Location: [facility];  Service: Orthopedics;  Laterality: Right;  . Coronary angioplasty  1998    stents 1998   . Total knee revision  05/13/2011    Procedure: TOTAL KNEE REVISION;  Surgeon: Shelda Pal, MD;  Location: [facility];  Service: Orthopedics;  Laterality: Right;  Reimplantation    History reviewed. No pertinent family history.  History  Substance Use Topics  . Smoking status: Former Smoker -- 1.00 packs/day for 15 years    Types: Cigars    Quit date: 12/23/2009  . Smokeless tobacco: Never Used     Comment: smoked a pipe for 6-7 y ears  . Alcohol Use: No      Review of Systems  Respiratory: Positive for shortness of breath.    All other systems reviewed and are negative except as noted in HPI.   Allergies  Acetaminophen  Home Medications   Current Outpatient Rx  Name  Route  Sig  Dispense  Refill  . ALPRAZolam (XANAX) 0.25 MG tablet   Oral   Take 0.25 mg by mouth 3 (three) times daily.          Marland Kitchen aspirin EC 81 MG tablet   Oral   Take 81 mg by mouth daily.         . Azelastine-Fluticasone (DYMISTA) 137-50 MCG/ACT SUSP   Nasal   Place 2 sprays into the nose 2 (two) times daily.   23 g   6   . baclofen (LIORESAL) 10 MG tablet   Oral   Take 1 tablet (10 mg total) by mouth 3 (three) times daily as needed.   90 each   3   . gabapentin (NEURONTIN) 400 MG capsule   Oral   Take 400 mg by mouth 3 (three) times daily.         . hydrochlorothiazide (HYDRODIURIL) 25 MG tablet   Oral   Take 25 mg by mouth daily.         Marland Kitchen lisinopril (PRINIVIL,ZESTRIL) 20 MG tablet   Oral   Take 2 tablets (40 mg total) by mouth daily.   30 tablet   2   . morphine (MS CONTIN) 30 MG 12 hr tablet   Oral   Take 1 tablet (30 mg total) by mouth 3 (three) times daily.   90 tablet   0   . morphine (MSIR) 15 MG tablet      1 every 8 hours prn pain- this is for break thru pain   30 tablet   0   . simvastatin (ZOCOR) 40 MG tablet   Oral   Take 40 mg by mouth at bedtime.         . ferrous sulfate 325 (65 FE) MG tablet   Oral   Take 325 mg by mouth daily with breakfast.           BP 135/93  Temp(Src) 97.5 F (36.4 C) (Oral)  Resp 19  Ht 5\' 10"  (1.778 m)  Wt 190 lb (86.183 kg)  BMI 27.26 kg/m2  SpO2 100%  Physical Exam  Nursing note and vitals reviewed. Constitutional: He is oriented to person, place, and time. He appears well-developed and well-nourished.  HENT:  Head: Normocephalic and atraumatic.  Eyes: EOM are normal. Pupils are equal, round, and reactive to light.  Neck: Normal range of motion. Neck supple.  Cardiovascular: Normal rate, normal heart sounds and intact distal pulses.   Pulmonary/Chest: Effort normal and breath sounds normal.  Abdominal: Bowel sounds are normal. He exhibits no distension. There is no  tenderness.  Musculoskeletal: Normal range of motion. He exhibits no edema and no tenderness.  Neurological: He is alert and oriented to person, place,  and time. He has normal strength. No cranial nerve deficit or sensory deficit.  Skin: Skin is warm and dry. No rash noted.  Psychiatric: He has a normal mood and affect.    ED Course  Procedures (including critical care time)  Labs Reviewed  CBC WITH DIFFERENTIAL - Abnormal; Notable for the following:    WBC 10.7 (*)    MCHC 37.0 (*)    All other components within normal limits  COMPREHENSIVE METABOLIC PANEL - Abnormal; Notable for the following:    Sodium 124 (*)    Chloride 85 (*)    Glucose, Bld 104 (*)    Alkaline Phosphatase 137 (*)    GFR calc non Af Amer 65 (*)    GFR calc Af Amer 75 (*)    All other components within normal limits  TROPONIN I   Dg Chest 2 View  06/20/2012  *RADIOLOGY REPORT*  Clinical Data: Shortness of breath, chest burning sensation  CHEST - 2 VIEW  Comparison:  05/18/2012  Findings:  The heart size and mediastinal contours are within normal limits.  Both lungs are clear.  The visualized skeletal structures are unremarkable. Stable degenerative changes of the thoracic spine.  IMPRESSION: No active cardiopulmonary disease.   Original Report Authenticated By: Judie Petit. Miles Costain, M.D.      No diagnosis found.    MDM   Date: 06/20/2012  Rate: 56  Rhythm: atrial fibrillation  QRS Axis: normal  Intervals: normal  ST/T Wave abnormalities: normal  Conduction Disutrbances:none  Narrative Interpretation:   Old EKG Reviewed: unchanged    Pt's daughter stopped me in the hall to tell me that he has these same symptoms frequently and has been evaluated several times before. She states he is also taking Morphine for chronic pain and has taken more than prescribed dose and run out sooner than scheduled. He had Rx from Dr. Darryll Capers on 4/22 (Morphine IR) and 4/29 (Morphine XR) and is already out of one of them.   8:01 AM Pt asking for pain medications. Labs and imaging reviewed. Sodium is low. Pt admits to drinking lots of water 'for his stomach'. Will give a saline  bolus and recheck sodium level. He wants to go home. Advised to avoid excessive free water intake.   8:33 AM IVF bolus nearly complete. Pt does not want to wait for recheck sodium. Advised to follow up with PCP. No concern for ACS, PE, PNA, PTX or other acute life-threatening cause of his symptoms.      Charles B. Bernette Mayers, MD 06/20/12 [phone]

## 2012-06-20 NOTE — ED Notes (Signed)
Patient states that he woke up around 5am today with shortness of breath and chest is burning.  Patient also has chronic back pain that is currently bothering him.  Patient was recently diagnosed with pneumonia and has finished his antibiotics

## 2012-06-23 ENCOUNTER — Encounter: Payer: Self-pay | Admitting: Physical Medicine & Rehabilitation

## 2012-06-23 ENCOUNTER — Ambulatory Visit (HOSPITAL_BASED_OUTPATIENT_CLINIC_OR_DEPARTMENT_OTHER): Payer: Medicare Other | Admitting: Physical Medicine & Rehabilitation

## 2012-06-23 ENCOUNTER — Encounter: Payer: Medicare Other | Attending: Physical Medicine & Rehabilitation

## 2012-06-23 VITALS — BP 136/66 | HR 60 | Resp 14 | Ht 70.0 in | Wt 184.0 lb

## 2012-06-23 DIAGNOSIS — G609 Hereditary and idiopathic neuropathy, unspecified: Secondary | ICD-10-CM | POA: Insufficient documentation

## 2012-06-23 DIAGNOSIS — Z79899 Other long term (current) drug therapy: Secondary | ICD-10-CM | POA: Insufficient documentation

## 2012-06-23 DIAGNOSIS — G8929 Other chronic pain: Secondary | ICD-10-CM

## 2012-06-23 DIAGNOSIS — M961 Postlaminectomy syndrome, not elsewhere classified: Secondary | ICD-10-CM | POA: Insufficient documentation

## 2012-06-23 DIAGNOSIS — G9009 Other idiopathic peripheral autonomic neuropathy: Secondary | ICD-10-CM

## 2012-06-23 DIAGNOSIS — M545 Low back pain, unspecified: Secondary | ICD-10-CM

## 2012-06-23 DIAGNOSIS — M79609 Pain in unspecified limb: Secondary | ICD-10-CM | POA: Insufficient documentation

## 2012-06-23 DIAGNOSIS — M47817 Spondylosis without myelopathy or radiculopathy, lumbosacral region: Secondary | ICD-10-CM | POA: Insufficient documentation

## 2012-06-23 DIAGNOSIS — F1920 Other psychoactive substance dependence, uncomplicated: Secondary | ICD-10-CM | POA: Insufficient documentation

## 2012-06-23 DIAGNOSIS — Z96659 Presence of unspecified artificial knee joint: Secondary | ICD-10-CM | POA: Insufficient documentation

## 2012-06-23 DIAGNOSIS — M48061 Spinal stenosis, lumbar region without neurogenic claudication: Secondary | ICD-10-CM

## 2012-06-23 DIAGNOSIS — Z5181 Encounter for therapeutic drug level monitoring: Secondary | ICD-10-CM

## 2012-06-23 NOTE — Progress Notes (Signed)
Clinical Data: Spinal stenosis. Multiple lumbar spine surgeries.  Numbness in the feet and legs.  MRI LUMBAR SPINE WITHOUT AND WITH CONTRAST  Technique: Multiplanar and multiecho pulse sequences of the lumbar  spine were obtained without and with intravenous contrast.  Contrast: 10 ml Multihance. A half-dose was given due to the lower  GFR (46).  Comparison: MRI lumbar spine 08/10/2005.  Findings: Normal signal is present in the conus medullaris which  terminates at L2. Dextroconvex scoliosis of the lumbar spine  appears more severe than on the prior study. Chronic end plate  fatty marrow changes are present at all levels from L2-L5,  predominately on the concave side of the curvature. The vertebral  body heights are maintained with the exception of a superior  endplate depression or Schmorl's node at L1. AP alignment is  anatomic. Individual disc levels are as follows.  The disc levels at L1-2 above are normal.  L2-3: The patient is status post right hemilaminectomy. This is  new. Advanced facet hypertrophy is present. There is significant  left lateral recess narrowing. Severe left foraminal stenosis has  progressed. There is mild right foraminal stenosis.  L3-4: Patient is status post laminectomy. There is stable  moderate central canal stenosis with left greater than right  lateral recess narrowing. Moderate biforaminal narrowing is  stable, left worse than right.  L4-5: Compensatory leftward curvature is present. There is stable  moderate right and mild left foraminal narrowing. Mild right  lateral recess narrowing is present.  L5-S1: There is no focal stenosis.  IMPRESSION:  1. The most significant change is progression of left lateral  recess and foraminal stenosis at L2-3.  2. Status post right hemilaminectomy at L2-3.  3. Stable spondylosis at L3-4 and L4-5.    Dr Verl Dicker office notified and he can come now and see Dr Jacinto Halim .

## 2012-06-23 NOTE — Progress Notes (Signed)
Subjective:    Patient ID: [name], male    DOB: [birth year], 72 y.o.   MRN: [id number]  HPI Chief complaint low back pain as well as leg pain into the left lower remedy. Past medical history significant for chronic low back pain as well as scoliosis. Has undergone lumbar laminectomies at L3-4 and L4-5 levels. 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 No further surgery is planned. Discussed spinal cord stimulation however due to cardiac issues this was not pursued. Recommended to have pain management.  Leg pain equals back pain. Has had physical therapy for knee replacements no recent physical therapy for back. Walking distances declining however was able to walk from parking lot to the office. Ambulates with cane.Able to dress and bathe himself. Has occasional falls, last one 2 weeks ago tripping on carpeting in home, No injury. Near fall one week ago in the garage. Has been on MS Contin 30 mg every 12 hours  as well as morphine sulfate immediate release 15 mg. Has been taking morphine for many years. Initially at bedtime and now during the day as well. Occasionally uses short acting morphine. Takes gabapentin 400 mg twice a day. Side effect is dizziness. Also takes baclofen for leg spasms on an as-needed basis. Last dose yesterday morning. Pain Inventory Average Pain 3 Pain Right Now 8 My pain is burning and aching  In the last 24 hours, has pain interfered with the following? General activity 7 Relation with others 4 Enjoyment of life 9 What TIME of day is your pain at its worst? evening and night Sleep (in general) Fair  Pain is worse with: standing and some activites Pain improves with: rest and medication Relief from Meds: 8  Mobility use a cane how many minutes can you walk? 5 ability  to climb steps?  yes do you drive?  yes  Function retired  Neuro/Psych bladder control problems weakness spasms dizziness depression anxiety  Prior Studies Any changes since last visit?  no  Physicians involved in your care Any changes since last visit?  yes Primary care Elesa Massed Cardiologist   History reviewed. No pertinent family history. History   Social History  . Marital Status: Married    Spouse Name: N/A    Number of Children: N/A  . Years of Education: N/A   Occupational History  . diesel repair     part time  . worked at [employer] History Main Topics  . Smoking status: Former Smoker -- 1.00 packs/day for 15 years    Types: Cigars    Quit date: 12/23/2009  . Smokeless tobacco: Never Used     Comment: smoked a pipe for 6-7 y ears  . Alcohol Use: No  . Drug Use: No  . Sexually Active: None   Other Topics Concern  . None   Social History Narrative  . None   Past Surgical History  Procedure Laterality Date  . Esophagogastroduodenoscopy  2004  . Colonoscopy  2004  . Lumbar laminectomy    . Lumbar fusion    . Knee arthroscopy      left  . Shoulder arthroscopy      right  . Knee arthroscopy      right  . Decompression of the median nerve      left  wrist and hand  . Revision of tkr  2011    on left  . Coronary stent placement    . Back surgery  2005-last    lumbar x2  . Excisional total knee arthroplasty  12/25/2010    Procedure: EXCISIONAL TOTAL KNEE ARTHROPLASTY;  Surgeon: Shelda Pal;  Location: [facility];  Service: Orthopedics;  Laterality: Right;  repeat irrigation   and debridement, removal and reinsertation of spacer block  . Joint replacement  2011    left knee x 2 ; 08/2010-right knee-infected  . I&d extremity  03/25/2011    Procedure: IRRIGATION AND DEBRIDEMENT EXTREMITY;  Surgeon: Shelda Pal, MD;  Location: [facility];  Service: Orthopedics;  Laterality: Right;  . Coronary angioplasty  1998    stents 1998   .  Total knee revision  05/13/2011    Procedure: TOTAL KNEE REVISION;  Surgeon: Shelda Pal, MD;  Location: [facility];  Service: Orthopedics;  Laterality: Right;  Reimplantation   Past Medical History  Diagnosis Date  . Hypertension   . Hyperlipidemia   . Preoperative cardiovascular examination   . Spinal stenosis, lumbar   . CAS (cerebral atherosclerosis)   . Erectile dysfunction   . Preventative health care   . Benign prostatic hypertrophy with urinary obstruction   . Family history of early CAD     male 1st degree relative <50  . Peptic ulcer disease   . Low back pain   . Superficial spreading melanoma   . Sleep apnea, obstructive     CPAP-does not use  . HA (headache)     sinus headaches  . Arthritis     knees  . Myocardial infarction 1997    Hx MI 1997 and 1998  . Anxiety   . Pneumonia 2005  . Anemia   . Depression     takes Zoloft  . Neuropathy, autonomic, idiopathic peripheral, other   . Blood transfusion 1 yr. ago    jerking,fever-after blood transfusion  . Blood transfusion     given wrong blood  . Coronary artery disease 1998    cardiac stent/ Clearance Dr Lovell Sheehan and Jacinto Halim with note on chart  . Dysrhythmia     hx of atrial fib per ov note of 12/12   . Bradycardia     hx of bradycardia in past per ov note of 12/12  . GERD (gastroesophageal reflux disease)    BP 163/82  Pulse 61  Resp 14  Ht 5\' 10"  (1.778 m)  Wt 184 lb (83.462 kg)  BMI 26.4 kg/m2  SpO2 98%    Review of Systems  Respiratory: Positive for shortness of breath.   Genitourinary:       Bladder control  Musculoskeletal: Positive for back pain and gait problem.       Spasms  Neurological: Positive for dizziness and weakness.  Psychiatric/Behavioral: Positive for dysphoric mood. The patient is nervous/anxious.   All other systems reviewed and are negative.       Objective:   Physical Exam  Nursing note and vitals reviewed. Constitutional: He is oriented to person, place, and time. He  appears well-developed and well-nourished.  HENT:  Head: Normocephalic and atraumatic.  Eyes: Conjunctivae and EOM are normal. Pupils are equal, round, and reactive to light.  Neck: Normal range of motion.  Musculoskeletal:       Right shoulder: Normal.       Left shoulder: He exhibits decreased range of motion.  Positive impingement sign left shoulder  Neurological: He is alert and oriented to person, place, and time. A sensory deficit is present. Coordination and gait abnormal.  Reflex Scores:      Tricep reflexes are 1+ on the right side and 1+ on the left side.      Bicep reflexes are 1+ on the right side and 1+ on the left side.      Brachioradialis reflexes are 1+ on the right side and 1+ on the left side.      Patellar reflexes are 0 on the right side and 0 on the left side.      Achilles reflexes are 0 on the right side and 0 on the left side. Decreased sensation starting at the tibial tuberosity area down to the toes. Stocking distribution. Also decreased sensation in the hands relatively sparing ulnar distribution. Motor strength 3 minus left deltoid 4 minus right deltoid 4/5 bilateral triceps biceps, 3 minus bilateral hand intrinsics 4 minus hip flexors knee extensors trace ankle dorsiflexors as well as trace toe flexors and extensors   Skin:  Fungal infection right great toenail  Psychiatric: He has a normal mood and affect.  Heart regular rate and rhythm no murmurs, lungs are clear to auscultation, Abdomen soft nontender to palpation    Assessment & Plan:  1. Chronic back pain as well as chronic radicular pain. He's had multiple spine surgeries. He has postlaminectomy syndrome. He has chronic narcotic dependence I was discussing pain medications with the patient he became dizzy, he said he felt some chest pain at first but then denied it. He said a few episodes like this over the last couple days. He is supposed to see his cardiologist today. He improved with laying down. Vital  signs rechecked. Please refer to document flow sheet. May look stable. Patient improved after laying down for a few minutes. cardiology office Contacted, they can see him right away. Patient back to baseline. Okayed to transfer down to car using wheelchair and then to cardiology office.His wife states that he has also had panic attacks in the past  2. Balance disorder secondary to severe peripheral neuropathy. He would benefit from physical therapy to reduce fall risk.  Patient also has a goal of coming off morphine. We can discuss this at subsequent visit. We'll check urine drug screen. We also discussed lumbar medial branch blocks to address lumbar spondylosis and axial back pain. He's had limited success with epidural injection therefore we will not repeat at the current time. RTC 2 weeks

## 2012-06-23 NOTE — Patient Instructions (Addendum)
Please go straight from this office to [facility], their office said they could see you right away. I'll see in 2 weeks we can discuss further treatments at that time. We discussed possibility of injections For back pain We discussed taper off of morphine and possibly switching to another medication We will also set you up with physical therapy to work on balance

## 2012-06-25 ENCOUNTER — Encounter (HOSPITAL_COMMUNITY): Payer: Self-pay | Admitting: Neurology

## 2012-06-25 ENCOUNTER — Emergency Department (HOSPITAL_COMMUNITY)
Admission: EM | Admit: 2012-06-25 | Discharge: 2012-06-25 | Discharge status: Against Medical Advice | Payer: Medicare Other | Attending: Emergency Medicine | Admitting: Emergency Medicine

## 2012-06-25 DIAGNOSIS — F411 Generalized anxiety disorder: Secondary | ICD-10-CM | POA: Insufficient documentation

## 2012-06-25 DIAGNOSIS — M549 Dorsalgia, unspecified: Secondary | ICD-10-CM | POA: Insufficient documentation

## 2012-06-25 DIAGNOSIS — F3289 Other specified depressive episodes: Secondary | ICD-10-CM | POA: Insufficient documentation

## 2012-06-25 DIAGNOSIS — G4733 Obstructive sleep apnea (adult) (pediatric): Secondary | ICD-10-CM | POA: Insufficient documentation

## 2012-06-25 DIAGNOSIS — I2510 Atherosclerotic heart disease of native coronary artery without angina pectoris: Secondary | ICD-10-CM | POA: Insufficient documentation

## 2012-06-25 DIAGNOSIS — G8929 Other chronic pain: Secondary | ICD-10-CM | POA: Insufficient documentation

## 2012-06-25 DIAGNOSIS — I10 Essential (primary) hypertension: Secondary | ICD-10-CM | POA: Insufficient documentation

## 2012-06-25 DIAGNOSIS — Z862 Personal history of diseases of the blood and blood-forming organs and certain disorders involving the immune mechanism: Secondary | ICD-10-CM | POA: Insufficient documentation

## 2012-06-25 DIAGNOSIS — Z872 Personal history of diseases of the skin and subcutaneous tissue: Secondary | ICD-10-CM | POA: Insufficient documentation

## 2012-06-25 DIAGNOSIS — Z79899 Other long term (current) drug therapy: Secondary | ICD-10-CM | POA: Insufficient documentation

## 2012-06-25 DIAGNOSIS — Z7982 Long term (current) use of aspirin: Secondary | ICD-10-CM | POA: Insufficient documentation

## 2012-06-25 DIAGNOSIS — E785 Hyperlipidemia, unspecified: Secondary | ICD-10-CM | POA: Insufficient documentation

## 2012-06-25 DIAGNOSIS — R109 Unspecified abdominal pain: Secondary | ICD-10-CM

## 2012-06-25 DIAGNOSIS — Z9861 Coronary angioplasty status: Secondary | ICD-10-CM | POA: Insufficient documentation

## 2012-06-25 DIAGNOSIS — K219 Gastro-esophageal reflux disease without esophagitis: Secondary | ICD-10-CM | POA: Insufficient documentation

## 2012-06-25 DIAGNOSIS — Z8669 Personal history of other diseases of the nervous system and sense organs: Secondary | ICD-10-CM | POA: Insufficient documentation

## 2012-06-25 DIAGNOSIS — M129 Arthropathy, unspecified: Secondary | ICD-10-CM | POA: Insufficient documentation

## 2012-06-25 DIAGNOSIS — Z8679 Personal history of other diseases of the circulatory system: Secondary | ICD-10-CM | POA: Insufficient documentation

## 2012-06-25 DIAGNOSIS — Z9889 Other specified postprocedural states: Secondary | ICD-10-CM | POA: Insufficient documentation

## 2012-06-25 DIAGNOSIS — Z8701 Personal history of pneumonia (recurrent): Secondary | ICD-10-CM | POA: Insufficient documentation

## 2012-06-25 DIAGNOSIS — Z8711 Personal history of peptic ulcer disease: Secondary | ICD-10-CM | POA: Insufficient documentation

## 2012-06-25 DIAGNOSIS — F329 Major depressive disorder, single episode, unspecified: Secondary | ICD-10-CM | POA: Insufficient documentation

## 2012-06-25 DIAGNOSIS — Z8739 Personal history of other diseases of the musculoskeletal system and connective tissue: Secondary | ICD-10-CM | POA: Insufficient documentation

## 2012-06-25 DIAGNOSIS — Z87891 Personal history of nicotine dependence: Secondary | ICD-10-CM | POA: Insufficient documentation

## 2012-06-25 DIAGNOSIS — I252 Old myocardial infarction: Secondary | ICD-10-CM | POA: Insufficient documentation

## 2012-06-25 DIAGNOSIS — Z87448 Personal history of other diseases of urinary system: Secondary | ICD-10-CM | POA: Insufficient documentation

## 2012-06-25 NOTE — ED Notes (Signed)
Per EMS- Pt comes from [facility], went there c/o LLQ abdominal pain x several weeks. EKG done, showing AFIB. Family was told "Pt having heart attack". Denying any cp, is in AFIB which is his hx. C/o back pain which is chronic and he takes morphine for such. BP 130/86, HR 68 irregular. Given shot of Demerol at family practice, pain 5/10 at current. A x 4. Skin warm and dry.

## 2012-06-25 NOTE — ED Notes (Signed)
Pt requesting to leave states he has to go home and tend to his wife. Pt states his family is mad at him for coming up here all the time. Will obtain EKG then discharge patient.

## 2012-06-25 NOTE — ED Provider Notes (Signed)
History     CSN: [id number]  Arrival date & time 06/25/12  1146   First MD Initiated Contact with Patient 06/25/12 1200      Chief Complaint  Patient presents with  . Abdominal Pain    (Consider location/radiation/quality/duration/timing/severity/associated sxs/prior treatment) Patient is a 72 y.o. male presenting with abdominal pain. The history is provided by the patient.  Abdominal Pain Associated symptoms: no chest pain, no diarrhea, no nausea, no shortness of breath and no vomiting    patient was sent from [facility]. He went in for lower abdominal pain over the last 2 weeks. The pain is in his left lower abdomen. He is chronic back pain and is on morphine for her. He states his crit is back pain  States he feels somewhat better after bowel movements. No fevers. He states he was seen at the urgent care and they did an EKG and told him he was having a heart attack. He's had no chest pain. He has a history of of his atrial fibrillation. No lightheadedness dizziness. No fevers. No cough. No swelling in his legs.  Past Medical History  Diagnosis Date  . Hypertension   . Hyperlipidemia   . Preoperative cardiovascular examination   . Spinal stenosis, lumbar   . CAS (cerebral atherosclerosis)   . Erectile dysfunction   . Preventative health care   . Benign prostatic hypertrophy with urinary obstruction   . Family history of early CAD     male 1st degree relative <50  . Peptic ulcer disease   . Low back pain   . Superficial spreading melanoma   . Sleep apnea, obstructive     CPAP-does not use  . HA (headache)     sinus headaches  . Arthritis     knees  . Myocardial infarction 1997    Hx MI 1997 and 1998  . Anxiety   . Pneumonia 2005  . Anemia   . Depression     takes Zoloft  . Neuropathy, autonomic, idiopathic peripheral, other   . Blood transfusion 1 yr. ago    jerking,fever-after blood transfusion  . Blood transfusion     given wrong blood  . Coronary  artery disease 1998    cardiac stent/ Clearance Dr Lovell Sheehan and Jacinto Halim with note on chart  . Dysrhythmia     hx of atrial fib per ov note of 12/12   . Bradycardia     hx of bradycardia in past per ov note of 12/12  . GERD (gastroesophageal reflux disease)     Past Surgical History  Procedure Laterality Date  . Esophagogastroduodenoscopy  2004  . Colonoscopy  2004  . Lumbar laminectomy    . Lumbar fusion    . Knee arthroscopy      left  . Shoulder arthroscopy      right  . Knee arthroscopy      right  . Decompression of the median nerve      left wrist and hand  . Revision of tkr  2011    on left  . Coronary stent placement    . Back surgery  2005-last    lumbar x2  . Excisional total knee arthroplasty  12/25/2010    Procedure: EXCISIONAL TOTAL KNEE ARTHROPLASTY;  Surgeon: Shelda Pal;  Location: [facility];  Service: Orthopedics;  Laterality: Right;  repeat irrigation   and debridement, removal and reinsertation of spacer block  . Joint replacement  2011    left knee x  2 ; 08/2010-right knee-infected  . I&d extremity  03/25/2011    Procedure: IRRIGATION AND DEBRIDEMENT EXTREMITY;  Surgeon: Shelda Pal, MD;  Location: [facility];  Service: Orthopedics;  Laterality: Right;  . Coronary angioplasty  1998    stents 1998   . Total knee revision  05/13/2011    Procedure: TOTAL KNEE REVISION;  Surgeon: Shelda Pal, MD;  Location: [facility];  Service: Orthopedics;  Laterality: Right;  Reimplantation    No family history on file.  History  Substance Use Topics  . Smoking status: Former Smoker -- 1.00 packs/day for 15 years    Types: Cigars    Quit date: 12/23/2009  . Smokeless tobacco: Never Used     Comment: smoked a pipe for 6-7 y ears  . Alcohol Use: No      Review of Systems  Constitutional: Negative for activity change and appetite change.  HENT: Negative for neck stiffness.   Eyes: Negative for pain.  Respiratory: Negative for chest tightness and shortness of breath.    Cardiovascular: Negative for chest pain and leg swelling.  Gastrointestinal: Positive for abdominal pain. Negative for nausea, vomiting and diarrhea.  Genitourinary: Negative for flank pain.  Musculoskeletal: Negative for back pain.  Skin: Negative for rash.  Neurological: Negative for weakness, numbness and headaches.  Psychiatric/Behavioral: Negative for behavioral problems.    Allergies  Acetaminophen  Home Medications   Current Outpatient Rx  Name  Route  Sig  Dispense  Refill  . ALPRAZolam (XANAX) 0.25 MG tablet   Oral   Take 0.25 mg by mouth 3 (three) times daily.         Marland Kitchen aspirin EC 81 MG tablet   Oral   Take 81 mg by mouth daily.         . Azelastine-Fluticasone (DYMISTA) 137-50 MCG/ACT SUSP   Nasal   Place 2 sprays into the nose 2 (two) times daily.   23 g   6   . baclofen (LIORESAL) 10 MG tablet   Oral   Take 1 tablet (10 mg total) by mouth 3 (three) times daily as needed.   90 each   3   . BEPREVE 1.5 % SOLN               . DIGOX 125 MCG tablet   Oral   Take 1 tablet by mouth daily.         Marland Kitchen gabapentin (NEURONTIN) 400 MG capsule   Oral   Take 400 mg by mouth 3 (three) times daily.         . hydrochlorothiazide (HYDRODIURIL) 25 MG tablet   Oral   Take 25 mg by mouth daily.         Marland Kitchen lisinopril (PRINIVIL,ZESTRIL) 20 MG tablet   Oral   Take 2 tablets (40 mg total) by mouth daily.   30 tablet   2   . morphine (MS CONTIN) 30 MG 12 hr tablet   Oral   Take 1 tablet (30 mg total) by mouth 3 (three) times daily.   90 tablet   0   . morphine (MSIR) 15 MG tablet      1 every 8 hours prn pain- this is for break thru pain   30 tablet   0   . NITROSTAT 0.4 MG SL tablet   Sublingual   Place 1 tablet under the tongue as needed. q 5 minutes x 3         . omeprazole (PRILOSEC)  20 MG capsule   Oral   Take 1 capsule by mouth daily.         . simvastatin (ZOCOR) 40 MG tablet   Oral   Take 40 mg by mouth at bedtime.            BP 163/72  Pulse 52  Temp(Src) 97.6 F (36.4 C) (Oral)  Resp 12  SpO2 99%  Physical Exam  Nursing note and vitals reviewed. Constitutional: He is oriented to person, place, and time. He appears well-developed and well-nourished.  HENT:  Head: Normocephalic and atraumatic.  Eyes: EOM are normal. Pupils are equal, round, and reactive to light.  Neck: Normal range of motion. Neck supple.  Cardiovascular: Normal rate, regular rhythm and normal heart sounds.   No murmur heard. Pulmonary/Chest: Effort normal and breath sounds normal.  Abdominal: Soft. Bowel sounds are normal. He exhibits no distension and no mass. There is tenderness. There is no rebound and no guarding.  Minimal left lower abdominal tenderness. No masses.  Musculoskeletal: Normal range of motion. He exhibits no edema.  Neurological: He is alert and oriented to person, place, and time. No cranial nerve deficit.  Skin: Skin is warm and dry.  Psychiatric: He has a normal mood and affect.    ED Course  Procedures (including critical care time)  Labs Reviewed - No data to display No results found.   1. Abdominal pain     Date: 06/25/2012  Rate: 74  Rhythm: normal sinus rhythm and premature atrial contractions (PAC)  QRS Axis: normal  Intervals: normal  ST/T Wave abnormalities: nonspecific ST/T changes  Conduction Disutrbances:none  Narrative Interpretation:   Old EKG Reviewed: unchanged     MDM  Patient with lower abdominal pain. Sent from urgent care. Patient was reportedly told "you're having a heart attack" EKG is reassuring. Patient is not willing to wait for any more evaluation or treatment. He left AMA.        Juliet Rude. Rubin Payor, MD 06/25/12 1308

## 2012-06-25 NOTE — ED Notes (Signed)
Pt IV removed, signed AMA form. States he cannot stay any longer. Pt is ambulatory, used urinal x 2. Alert and oriented.

## 2012-06-29 ENCOUNTER — Telehealth: Payer: Self-pay | Admitting: Internal Medicine

## 2012-06-29 DIAGNOSIS — G4730 Sleep apnea, unspecified: Secondary | ICD-10-CM

## 2012-06-29 NOTE — Telephone Encounter (Signed)
Per dr Marcelino Freestone to dr young for abnormal sleep study

## 2012-06-29 NOTE — Telephone Encounter (Signed)
PT calling to discuss his sleep study done 05/24/12. Please assist.

## 2012-07-01 ENCOUNTER — Ambulatory Visit: Payer: Medicare Other | Attending: Physical Medicine & Rehabilitation

## 2012-07-01 ENCOUNTER — Ambulatory Visit (INDEPENDENT_AMBULATORY_CARE_PROVIDER_SITE_OTHER): Payer: Medicare Other | Admitting: Internal Medicine

## 2012-07-01 ENCOUNTER — Encounter: Payer: Self-pay | Admitting: Internal Medicine

## 2012-07-01 VITALS — BP 140/80 | HR 74 | Temp 98.0°F | Resp 20 | Wt 181.0 lb

## 2012-07-01 DIAGNOSIS — M545 Low back pain, unspecified: Secondary | ICD-10-CM | POA: Insufficient documentation

## 2012-07-01 DIAGNOSIS — M2560 Stiffness of unspecified joint, not elsewhere classified: Secondary | ICD-10-CM | POA: Insufficient documentation

## 2012-07-01 DIAGNOSIS — R269 Unspecified abnormalities of gait and mobility: Secondary | ICD-10-CM | POA: Insufficient documentation

## 2012-07-01 DIAGNOSIS — E785 Hyperlipidemia, unspecified: Secondary | ICD-10-CM

## 2012-07-01 DIAGNOSIS — IMO0001 Reserved for inherently not codable concepts without codable children: Secondary | ICD-10-CM | POA: Insufficient documentation

## 2012-07-01 DIAGNOSIS — I2510 Atherosclerotic heart disease of native coronary artery without angina pectoris: Secondary | ICD-10-CM

## 2012-07-01 DIAGNOSIS — I10 Essential (primary) hypertension: Secondary | ICD-10-CM

## 2012-07-01 NOTE — Patient Instructions (Addendum)
Discontinue hydrochlorothiazide  Return in one month for followup

## 2012-07-02 ENCOUNTER — Encounter: Payer: Self-pay | Admitting: Internal Medicine

## 2012-07-02 NOTE — Progress Notes (Signed)
Subjective:    Patient ID: [name], male    DOB: 29-May-1940, 72 y.o.   MRN: [id number]  HPI  72 year old patient who is seen today for followup. Chronic medical problems include hypertension coronary artery disease and chronic back pain. He presents today with a chief complaint of dizziness that he states has been present for at least 6 months. This has intensified over the past 2 weeks. He has treated hypertension which includes diuretic therapy. Laboratory studies were done recently revealed worsening hyponatremia with a serum sodium of 124. He also complains of some mild abdominal discomfort which seems to be postprandial and mild.  Past Medical History  Diagnosis Date  . Hypertension   . Hyperlipidemia   . Preoperative cardiovascular examination   . Spinal stenosis, lumbar   . CAS (cerebral atherosclerosis)   . Erectile dysfunction   . Preventative health care   . Benign prostatic hypertrophy with urinary obstruction   . Family history of early CAD     male 1st degree relative <50  . Peptic ulcer disease   . Low back pain   . Superficial spreading melanoma   . Sleep apnea, obstructive     CPAP-does not use  . HA (headache)     sinus headaches  . Arthritis     knees  . Myocardial infarction 1997    Hx MI 1997 and 1998  . Anxiety   . Pneumonia 2005  . Anemia   . Depression     takes Zoloft  . Neuropathy, autonomic, idiopathic peripheral, other   . Blood transfusion 1 yr. ago    jerking,fever-after blood transfusion  . Blood transfusion     given wrong blood  . Coronary artery disease 1998    cardiac stent/ Clearance Dr Lovell Sheehan and Jacinto Halim with note on chart  . Dysrhythmia     hx of atrial fib per ov note of 12/12   . Bradycardia     hx of bradycardia in past per ov note of 12/12  . GERD (gastroesophageal reflux disease)     History   Social History  . Marital Status: Married    Spouse Name: N/A    Number of Children: N/A  . Years of Education: N/A    Occupational History  . diesel repair     part time  . worked at [employer] History Main Topics  . Smoking status: Former Smoker -- 1.00 packs/day for 15 years    Types: Cigars    Quit date: 12/23/2009  . Smokeless tobacco: Never Used     Comment: smoked a pipe for 6-7 y ears  . Alcohol Use: No  . Drug Use: No  . Sexually Active: Not on file   Other Topics Concern  . Not on file   Social History Narrative  . No narrative on file    Past Surgical History  Procedure Laterality Date  . Esophagogastroduodenoscopy  2004  . Colonoscopy  2004  . Lumbar laminectomy    . Lumbar fusion    . Knee arthroscopy      left  . Shoulder arthroscopy      right  . Knee arthroscopy      right  . Decompression of the median nerve      left wrist and hand  . Revision of tkr  2011    on left  . Coronary stent placement    . Back surgery  2005-last    lumbar x2  .  Excisional total knee arthroplasty  12/25/2010    Procedure: EXCISIONAL TOTAL KNEE ARTHROPLASTY;  Surgeon: Shelda Pal;  Location: [facility];  Service: Orthopedics;  Laterality: Right;  repeat irrigation   and debridement, removal and reinsertation of spacer block  . Joint replacement  2011    left knee x 2 ; 08/2010-right knee-infected  . I&d extremity  03/25/2011    Procedure: IRRIGATION AND DEBRIDEMENT EXTREMITY;  Surgeon: Shelda Pal, MD;  Location: [facility];  Service: Orthopedics;  Laterality: Right;  . Coronary angioplasty  1998    stents 1998   . Total knee revision  05/13/2011    Procedure: TOTAL KNEE REVISION;  Surgeon: Shelda Pal, MD;  Location: [facility];  Service: Orthopedics;  Laterality: Right;  Reimplantation    History reviewed. No pertinent family history.  Allergies  Allergen Reactions  . Acetaminophen Other (See Comments)    LIVER INFLAMMATION IN HIGH DOSES    Current Outpatient Prescriptions on File Prior to Visit  Medication Sig Dispense Refill  . ALPRAZolam (XANAX) 0.25 MG tablet Take 0.25  mg by mouth 3 (three) times daily.      . Azelastine-Fluticasone (DYMISTA) 137-50 MCG/ACT SUSP Place 2 sprays into the nose 2 (two) times daily.  23 g  6  . baclofen (LIORESAL) 10 MG tablet Take 1 tablet (10 mg total) by mouth 3 (three) times daily as needed.  90 each  3  . BEPREVE 1.5 % SOLN       . DIGOX 125 MCG tablet Take 1 tablet by mouth daily.      Marland Kitchen gabapentin (NEURONTIN) 400 MG capsule Take 400 mg by mouth 3 (three) times daily.      Marland Kitchen lisinopril (PRINIVIL,ZESTRIL) 20 MG tablet Take 2 tablets (40 mg total) by mouth daily.  30 tablet  2  . morphine (MS CONTIN) 30 MG 12 hr tablet Take 1 tablet (30 mg total) by mouth 3 (three) times daily.  90 tablet  0  . NITROSTAT 0.4 MG SL tablet Place 1 tablet under the tongue as needed. q 5 minutes x 3      . omeprazole (PRILOSEC) 20 MG capsule Take 1 capsule by mouth daily.      . simvastatin (ZOCOR) 40 MG tablet Take 40 mg by mouth at bedtime.      Marland Kitchen aspirin EC 81 MG tablet Take 81 mg by mouth daily.      . [DISCONTINUED] zolpidem (AMBIEN) 5 MG tablet Take 1 tablet (5 mg total) by mouth at bedtime as needed for sleep.  20 tablet  0   No current facility-administered medications on file prior to visit.    BP 140/80  Pulse 74  Temp(Src) 98 F (36.7 C) (Oral)  Resp 20  Wt 181 lb (82.101 kg)  BMI 25.97 kg/m2  SpO2 97%       Review of Systems  Constitutional: Negative for fever, chills, appetite change and fatigue.  HENT: Negative for hearing loss, ear pain, congestion, sore throat, trouble swallowing, neck stiffness, dental problem, voice change and tinnitus.   Eyes: Negative for pain, discharge and visual disturbance.  Respiratory: Negative for cough, chest tightness, wheezing and stridor.   Cardiovascular: Negative for chest pain, palpitations and leg swelling.  Gastrointestinal: Negative for nausea, vomiting, abdominal pain, diarrhea, constipation, blood in stool and abdominal distention.  Genitourinary: Negative for urgency,  hematuria, flank pain, discharge, difficulty urinating and genital sores.  Musculoskeletal: Positive for back pain and arthralgias. Negative for myalgias, joint swelling  and gait problem.  Skin: Negative for rash.  Neurological: Positive for dizziness. Negative for syncope, speech difficulty, weakness, numbness and headaches.  Hematological: Negative for adenopathy. Does not bruise/bleed easily.  Psychiatric/Behavioral: Negative for behavioral problems and dysphoric mood. The patient is not nervous/anxious.        Objective:   Physical Exam  Constitutional: He is oriented to person, place, and time. He appears well-developed.  Blood pressure 140/80 without orthostatic change  HENT:  Head: Normocephalic.  Right Ear: External ear normal.  Left Ear: External ear normal.  Eyes: Conjunctivae and EOM are normal.  Neck: Normal range of motion.  Cardiovascular: Normal rate and normal heart sounds.   Irregular rhythm  Pulmonary/Chest: Breath sounds normal.  Abdominal: Bowel sounds are normal.  Musculoskeletal: Normal range of motion. He exhibits no edema and no tenderness.  Neurological: He is alert and oriented to person, place, and time.  Psychiatric: He has a normal mood and affect. His behavior is normal.          Assessment & Plan:  Nonspecific dizziness History of hyponatremia. We'll discontinue diuretic therapy. Hold for dizziness will also improve we'll continue lisinopril. We'll reevaluate blood pressure electrolytes and general status in 4 weeks Coronary artery disease stable Chronic back pain

## 2012-07-06 ENCOUNTER — Telehealth: Payer: Self-pay | Admitting: Internal Medicine

## 2012-07-06 DIAGNOSIS — M549 Dorsalgia, unspecified: Secondary | ICD-10-CM

## 2012-07-06 MED ORDER — MORPHINE SULFATE ER 30 MG PO TBCR: 30.0000 mg | EXTENDED_RELEASE_TABLET | Freq: Three times a day (TID) | ORAL | Status: DC

## 2012-07-06 NOTE — Telephone Encounter (Signed)
Pt needs refill of morphine (MS CONTIN) 30 MG 12 hr tablet

## 2012-07-06 NOTE — Telephone Encounter (Signed)
Printed and will call pt to pick up after dr jenkins signs 

## 2012-07-09 ENCOUNTER — Ambulatory Visit: Payer: Medicare Other

## 2012-07-09 ENCOUNTER — Ambulatory Visit (HOSPITAL_BASED_OUTPATIENT_CLINIC_OR_DEPARTMENT_OTHER): Payer: Medicare Other | Admitting: Physical Medicine & Rehabilitation

## 2012-07-09 ENCOUNTER — Encounter: Payer: Self-pay | Admitting: Physical Medicine & Rehabilitation

## 2012-07-09 VITALS — BP 145/75 | HR 35 | Resp 14 | Ht 70.0 in | Wt 183.0 lb

## 2012-07-09 DIAGNOSIS — M545 Low back pain: Secondary | ICD-10-CM

## 2012-07-09 DIAGNOSIS — M47817 Spondylosis without myelopathy or radiculopathy, lumbosacral region: Secondary | ICD-10-CM

## 2012-07-09 DIAGNOSIS — G8929 Other chronic pain: Secondary | ICD-10-CM

## 2012-07-09 DIAGNOSIS — M48061 Spinal stenosis, lumbar region without neurogenic claudication: Secondary | ICD-10-CM

## 2012-07-09 NOTE — Patient Instructions (Signed)
You have dependance not addiction to morphine We will do lumbar arthritis injections to help with pain prior to reducing morphine dose

## 2012-07-09 NOTE — Progress Notes (Signed)
Subjective:    Patient ID: [name], male    DOB: 07/30/1940, 72 y.o.   MRN: [id number]  HPI Chief complaint low back pain as well as leg pain into the left lower remedy.  Past medical history significant for chronic low back pain as well as scoliosis. Has undergone lumbar laminectomies at L3-4 and L4-5 levels. 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 No further surgery is planned. Discussed spinal cord stimulation however due to cardiac issues this was not pursued.  Recommended to have pain management.  Leg pain equals back pain. Has had physical therapy for knee replacements no recent physical therapy for back.  Walking distances declining however was able to walk from parking lot to the office. Ambulates with cane.Able to dress and bathe himself.  Has occasional falls, last one 2 weeks ago tripping on throw rug  Has been on MS Contin 30 mg every 12 hours as well as morphine sulfate immediate release 15 mg. Has been taking morphine for many years. Initially at bedtime and now during the day as well. Occasionally uses short acting morphine.  Takes gabapentin 400 mg twice a day. Side effect is dizziness.  Also takes baclofen for leg spasms on an as-needed basis. Last dose yesterday    Pain Inventory Average Pain 6 Pain Right Now 6 My pain is constant, dull and aching  In the last 24 hours, has pain interfered with the following? General activity 4 Relation with others 8 Enjoyment of life 3 What TIME of day is your pain at its worst? evening Sleep (in general) Fair  Pain is worse with: unsure Pain improves with: injections Relief from Meds: 7  Mobility use a cane use a walker how many minutes can you walk? 3-5 ability to climb steps?  yes do you drive?  yes  Function not employed: date  last employed 06/2011 I need assistance with the following:  household duties and shopping  Neuro/Psych bladder control problems trouble walking  Prior Studies Any changes since last visit?  no  Physicians involved in your care Any changes since last visit?  no   History reviewed. No pertinent family history. History   Social History  . Marital Status: Married    Spouse Name: N/A    Number of Children: N/A  . Years of Education: N/A   Occupational History  . diesel repair     part time  . worked at [employer] History Main Topics  . Smoking status: Former Smoker -- 1.00 packs/day for 15 years    Types: Cigars    Quit date: 12/23/2009  . Smokeless tobacco: Never Used     Comment: smoked a pipe for 6-7 y ears  . Alcohol Use: No  . Drug Use: No  . Sexually Active: None   Other Topics Concern  . None   Social History Narrative  . None   Past Surgical History  Procedure Laterality Date  . Esophagogastroduodenoscopy  2004  . Colonoscopy  2004  . Lumbar laminectomy    . Lumbar fusion    . Knee arthroscopy      left  . Shoulder arthroscopy      right  . Knee arthroscopy      right  . Decompression of the median nerve      left  wrist and hand  . Revision of tkr  2011    on left  . Coronary stent placement    . Back surgery  2005-last    lumbar x2  . Excisional total knee arthroplasty  12/25/2010    Procedure: EXCISIONAL TOTAL KNEE ARTHROPLASTY;  Surgeon: Shelda Pal;  Location: [facility];  Service: Orthopedics;  Laterality: Right;  repeat irrigation   and debridement, removal and reinsertation of spacer block  . Joint replacement  2011    left knee x 2 ; 08/2010-right knee-infected  . I&d extremity  03/25/2011    Procedure: IRRIGATION AND DEBRIDEMENT EXTREMITY;  Surgeon: Shelda Pal, MD;  Location: [facility];  Service: Orthopedics;  Laterality: Right;  . Coronary angioplasty  1998    stents 1998   . Total knee revision  05/13/2011    Procedure: TOTAL  KNEE REVISION;  Surgeon: Shelda Pal, MD;  Location: [facility];  Service: Orthopedics;  Laterality: Right;  Reimplantation   Past Medical History  Diagnosis Date  . Hypertension   . Hyperlipidemia   . Preoperative cardiovascular examination   . Spinal stenosis, lumbar   . CAS (cerebral atherosclerosis)   . Erectile dysfunction   . Preventative health care   . Benign prostatic hypertrophy with urinary obstruction   . Family history of early CAD     male 1st degree relative <50  . Peptic ulcer disease   . Low back pain   . Superficial spreading melanoma   . Sleep apnea, obstructive     CPAP-does not use  . HA (headache)     sinus headaches  . Arthritis     knees  . Myocardial infarction 1997    Hx MI 1997 and 1998  . Anxiety   . Pneumonia 2005  . Anemia   . Depression     takes Zoloft  . Neuropathy, autonomic, idiopathic peripheral, other   . Blood transfusion 1 yr. ago    jerking,fever-after blood transfusion  . Blood transfusion     given wrong blood  . Coronary artery disease 1998    cardiac stent/ Clearance Dr Lovell Sheehan and Jacinto Halim with note on chart  . Dysrhythmia     hx of atrial fib per ov note of 12/12   . Bradycardia     hx of bradycardia in past per ov note of 12/12  . GERD (gastroesophageal reflux disease)    BP 145/75  Pulse 35  Resp 14  Ht 5\' 10"  (1.778 m)  Wt 183 lb (83.008 kg)  BMI 26.26 kg/m2  SpO2 98%     Review of Systems  Genitourinary: Positive for difficulty urinating.  Musculoskeletal: Positive for back pain and gait problem.  All other systems reviewed and are negative.       Objective:   Physical Exam        Subjective:       Patient ID: [name], male    DOB: 11-09-1940, 72 y.o.   MRN: [id number]   HPI Chief complaint low back pain as well as leg pain into the left lower remedy. Past medical history significant for chronic low back pain as well as scoliosis. Has undergone lumbar laminectomies at L3-4 and L4-5 levels.  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 No further surgery is planned. Discussed spinal cord stimulation  however due to cardiac issues this was not pursued. Recommended to have pain management.   Leg pain equals back pain. Has had physical therapy for knee replacements no recent physical therapy for back. Walking distances declining however was able to walk from parking lot to the office. Ambulates with cane.Able to dress and bathe himself. Has occasional falls, last one 2 weeks ago tripping on carpeting in home, No injury. Near fall one week ago in the garage. Has been on MS Contin 30 mg every 12 hours  as well as morphine sulfate immediate release 15 mg. Has been taking morphine for many years. Initially at bedtime and now during the day as well. Occasionally uses short acting morphine. Takes gabapentin 400 mg twice a day. Side effect is dizziness. Also takes baclofen for leg spasms on an as-needed basis. Last dose yesterday morning. Pain Inventory Average Pain 3 Pain Right Now 8 My pain is burning and aching   In the last 24 hours, has pain interfered with the following? General activity 7 Relation with others 4 Enjoyment of life 9 What TIME of day is your pain at its worst? evening and night Sleep (in general) Fair   Pain is worse with: standing and some activites Pain improves with: rest and medication Relief from Meds: 8   Mobility use a cane how many minutes can you walk? 5 ability to climb steps?  yes do you drive?  yes   Function retired   Neuro/Psych bladder control problems weakness spasms dizziness depression anxiety   Prior Studies Any changes since last visit?  no   Physicians involved in your care Any changes since last visit?  yes Primary care Elesa Massed Cardiologist     History reviewed. No pertinent family history. History       Social History   .  Marital Status:  Married       Spouse Name:  N/A       Number of Children:  N/A   .  Years of Education:  N/A       Occupational History   .  diesel repair         part time   .  worked at [employer] History Main Topics   .  Smoking status:  Former Smoker -- 1.00 packs/day for 15 years       Types:  Cigars       Quit date:  12/23/2009   .  Smokeless tobacco:  Never Used         Comment: smoked a pipe for 6-7 y ears   .  Alcohol Use:  No   .  Drug Use:  No   .  Sexually Active:  None       Other Topics  Concern   .  None       Social History Narrative   .  None       Past Surgical History   Procedure  Laterality  Date   .  Esophagogastroduodenoscopy    2004   .  Colonoscopy    2004   .  Lumbar laminectomy       .  Lumbar fusion       .  Knee arthroscopy           left   .  Shoulder arthroscopy           right   .  Knee arthroscopy  right   .  Decompression of the median nerve           left wrist and hand   .  Revision of tkr    2011       on left   .  Coronary stent placement       .  Back surgery    2005-last       lumbar x2   .  Excisional total knee arthroplasty    12/25/2010       Procedure: EXCISIONAL TOTAL KNEE ARTHROPLASTY;  Surgeon: Shelda Pal;  Location: [facility];  Service: Orthopedics;  Laterality: Right;  repeat irrigation   and debridement, removal and reinsertation of spacer block   .  Joint replacement    2011       left knee x 2 ; 08/2010-right knee-infected   .  I&d extremity    03/25/2011       Procedure: IRRIGATION AND DEBRIDEMENT EXTREMITY;  Surgeon: Shelda Pal, MD;  Location: [facility];  Service: Orthopedics;  Laterality: Right;   .  Coronary angioplasty    1998       stents 1998    .  Total knee revision    05/13/2011       Procedure: TOTAL KNEE REVISION;  Surgeon: Shelda Pal, MD;  Location: [facility];   Service: Orthopedics;  Laterality: Right;  Reimplantation       Past Medical History   Diagnosis  Date   .  Hypertension     .  Hyperlipidemia     .  Preoperative cardiovascular examination     .  Spinal stenosis, lumbar     .  CAS (cerebral atherosclerosis)     .  Erectile dysfunction     .  Preventative health care     .  Benign prostatic hypertrophy with urinary obstruction     .  Family history of early CAD         male 1st degree relative <50   .  Peptic ulcer disease     .  Low back pain     .  Superficial spreading melanoma     .  Sleep apnea, obstructive         CPAP-does not use   .  HA (headache)         sinus headaches   .  Arthritis         knees   .  Myocardial infarction  1997       Hx MI 1997 and 1998   .  Anxiety     .  Pneumonia  2005   .  Anemia     .  Depression         takes Zoloft   .  Neuropathy, autonomic, idiopathic peripheral, other     .  Blood transfusion  1 yr. ago       jerking,fever-after blood transfusion   .  Blood transfusion         given wrong blood   .  Coronary artery disease  1998       cardiac stent/ Clearance Dr Lovell Sheehan and Jacinto Halim with note on chart   .  Dysrhythmia         hx of atrial fib per ov note of 12/12    .  Bradycardia         hx of bradycardia in past per ov note of 12/12   .  GERD (gastroesophageal reflux disease)        BP 163/82  Pulse 61  Resp 14  Ht 5\' 10"  (1.778 m)  Wt 184 lb (83.462 kg)  BMI 26.4 kg/m2  SpO2 98%       Review of Systems  Respiratory: Positive for shortness of breath.   Genitourinary:        Bladder control  Musculoskeletal: Positive for back pain and gait problem.        Spasms  Neurological: Positive for dizziness and weakness.  Psychiatric/Behavioral: Positive for dysphoric mood. The patient is nervous/anxious.   All other systems reviewed and are negative.         Objective:     Physical Exam  Nursing note and vitals reviewed. Constitutional: He is oriented to  person, place, and time. He appears well-developed and well-nourished.  HENT:   Head: Normocephalic and atraumatic.  Eyes: Conjunctivae and EOM are normal. Pupils are equal, round, and reactive to light.  Neck: Normal range of motion.  Musculoskeletal:       Right shoulder: Normal.       Left shoulder: He exhibits decreased range of motion.  Positive impingement sign left shoulder  Neurological: He is alert and oriented to person, place, and time. A sensory deficit is present. Coordination and gait abnormal.   Reflex Scores:      Tricep reflexes are 1+ on the right side and 1+ on the left side.      Bicep reflexes are 1+ on the right side and 1+ on the left side.      Brachioradialis reflexes are 1+ on the right side and 1+ on the left side.      Patellar reflexes are 0 on the right side and 0 on the left side.      Achilles reflexes are 0 on the right side and 0 on the left side. Decreased sensation starting at the tibial tuberosity area down to the toes. Stocking distribution. Also decreased sensation in the hands relatively sparing ulnar distribution. Motor strength 3 minus left deltoid 4 minus right deltoid 4/5 bilateral triceps biceps, 3 minus bilateral hand intrinsics 4 minus hip flexors knee extensors trace ankle            Assessment & Plan:   Subjective:   Patient ID: [name], male DOB: 1940/11/30, 72 y.o. MRN: [id number]  HPI  Chief complaint low back pain as well as leg pain into the left lower remedy.  Past medical history significant for chronic low back pain as well as scoliosis. Has undergone lumbar laminectomies at L3-4 and L4-5 levels. 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 No further surgery is planned. Discussed spinal cord stimulation however due to cardiac issues this was  not pursued.  Recommended to have pain management.  Leg pain equals back pain. Has had physical therapy for knee replacements no recent physical therapy for back.  Walking distances declining however was able to walk from parking lot to the office. Ambulates with cane.Able to dress and bathe himself.  Has occasional falls, last one 2 weeks ago tripping on carpeting in home, No injury. Near fall one week ago in the garage.  Has been on MS Contin 30 mg every 12 hours not taking morphine sulfate immediate release 15 mg. Has been taking morphine for many  years. Initially at bedtime and now during the day as well. Occasionally uses short acting morphine.  Takes gabapentin 400 mg twice a day. Side effect is dizziness.  Also takes baclofen for leg spasms on an as-needed basis. Last dose yesterday morning.  Pain Inventory  Average Pain 3  Pain Right Now 8  My pain is burning and aching  In the last 24 hours, has pain interfered with the following?  General activity 7  Relation with others 4  Enjoyment of life 9  What TIME of day is your pain at its worst? evening and night  Sleep (in general) Fair  Pain is worse with: standing and some activites  Pain improves with: rest and medication  Relief from Meds: 8  Mobility  use a cane  how many minutes can you walk? 5  ability to climb steps? yes  do you drive? yes  Function  retired  Neuro/Psych  bladder control problems  weakness  spasms  dizziness  depression  anxiety  Prior Studies  Any changes since last visit? no  Physicians involved in your care  Any changes since last visit? yes  Primary care Elesa Massed Cardiologist  History reviewed. No pertinent family history.  History    Social History   .  Marital Status:  Married     Spouse Name:  N/A     Number of Children:  N/A   .  Years of Education:  N/A    Occupational History   .  diesel repair      part time   .  worked at [employer] History Main Topics    .  Smoking status:  Former Smoker -- 1.00 packs/day for 15 years     Types:  Cigars     Quit date:  12/23/2009   .  Smokeless tobacco:  Never Used      Comment: smoked a pipe for 6-7 y ears   .  Alcohol Use:  No   .  Drug Use:  No   .  Sexually Active:  None    Other Topics  Concern   .  None    Social History Narrative   .  None    Past Surgical History   Procedure  Laterality  Date   .  Esophagogastroduodenoscopy   2004   .  Colonoscopy   2004   .  Lumbar laminectomy     .  Lumbar fusion     .  Knee arthroscopy       left   .  Shoulder arthroscopy       right   .  Knee arthroscopy       right   .  Decompression of the median nerve       left wrist and hand   .  Revision of tkr   2011     on left   .  Coronary stent placement     .  Back surgery   2005-last     lumbar x2   .  Excisional total knee arthroplasty   12/25/2010     Procedure: EXCISIONAL TOTAL KNEE ARTHROPLASTY; Surgeon: Shelda Pal; Location: [facility]; Service: Orthopedics; Laterality: Right; repeat irrigation and debridement, removal and reinsertation of spacer block   .  Joint replacement   2011     left knee x 2 ; 08/2010-right knee-infected   .  I&d extremity   03/25/2011  Procedure: IRRIGATION AND DEBRIDEMENT EXTREMITY; Surgeon: Shelda Pal, MD; Location: [facility]; Service: Orthopedics; Laterality: Right;   .  Coronary angioplasty   1998     stents 1998   .  Total knee revision   05/13/2011     Procedure: TOTAL KNEE REVISION; Surgeon: Shelda Pal, MD; Location: [facility]; Service: Orthopedics; Laterality: Right; Reimplantation    Past Medical History   Diagnosis  Date   .  Hypertension    .  Hyperlipidemia    .  Preoperative cardiovascular examination    .  Spinal stenosis, lumbar    .  CAS (cerebral atherosclerosis)    .  Erectile dysfunction    .  Preventative health care    .  Benign prostatic hypertrophy with urinary obstruction    .  Family history of early CAD      male 1st degree  relative <50   .  Peptic ulcer disease    .  Low back pain    .  Superficial spreading melanoma    .  Sleep apnea, obstructive      CPAP-does not use   .  HA (headache)      sinus headaches   .  Arthritis      knees   .  Myocardial infarction  1997     Hx MI 1997 and 1998   .  Anxiety    .  Pneumonia  2005   .  Anemia    .  Depression      takes Zoloft   .  Neuropathy, autonomic, idiopathic peripheral, other    .  Blood transfusion  1 yr. ago     jerking,fever-after blood transfusion   .  Blood transfusion      given wrong blood   .  Coronary artery disease  1998     cardiac stent/ Clearance Dr Lovell Sheehan and Jacinto Halim with note on chart   .  Dysrhythmia      hx of atrial fib per ov note of 12/12   .  Bradycardia      hx of bradycardia in past per ov note of 12/12   .  GERD (gastroesophageal reflux disease)    BP 163/82  Pulse 61  Resp 14  Ht 5\' 10"  (1.778 m)  Wt 184 lb (83.462 kg)  BMI 26.4 kg/m2  SpO2 98%  Review of Systems  Respiratory: Positive for shortness of breath.  Genitourinary:  Bladder control  Musculoskeletal: Positive for back pain and gait problem.  Spasms  Neurological: Positive for dizziness and weakness.  Psychiatric/Behavioral: Positive for dysphoric mood. The patient is nervous/anxious.  All other systems reviewed and are negative.  Objective:   Physical Exam  Nursing note and vitals reviewed.  Constitutional: He is oriented to person, place, and time. He appears well-developed and well-nourished.  HENT:  Head: Normocephalic and atraumatic.  Eyes: Conjunctivae and EOM are normal. Pupils are equal, round, and reactive to light.  Neck: Normal range of motion.  Musculoskeletal:  Right shoulder: Normal.  Left shoulder: He exhibits decreased range of motion.  Positive impingement sign left shoulder  Neurological: He is alert and oriented to person, place, and time. A sensory deficit is present. Coordination and gait abnormal.  Reflex Scores:  Tricep  reflexes are 1+ on the right side and 1+ on the left side.  Bicep reflexes are 1+ on the right side and 1+ on the left side.  Brachioradialis reflexes are 1+ on the right  side and 1+ on the left side.  Patellar reflexes are 0 on the right side and 0 on the left side.  Achilles reflexes are 0 on the right side and 0 on the left side. Decreased sensation starting at the tibial tuberosity area down to the toes. Stocking distribution. Also decreased sensation in the hands relatively sparing ulnar distribution. Motor strength 3 minus left deltoid 4 minus right deltoid 4/5 bilateral triceps biceps, 3 minus bilateral hand intrinsics 4 minus hip flexors knee extensors trace ankle dorsiflexors as well as trace toe flexors and extensors Skin:  Fungal infection right great toenail  Psychiatric: He has a normal mood and affect.  Heart regular rate and rhythm no murmurs, lungs are clear to auscultation, Abdomen soft nontender to palpation  Assessment & Plan:   1. Chronic back pain as well as chronic radicular pain. He's had multiple spine surgeries. He has postlaminectomy syndrome. He has chronic narcotic dependence   2. Balance disorder secondary to severe peripheral neuropathy. He would benefit from physical therapy to reduce fall risk. One visit thus far Patient also has a goal of coming off morphine. We can discuss this at subsequent visit. Consistent urine drug screen.  We also discussed lumbar medial branch blocks to address lumbar spondylosis and axial back pain.  He's had limited success with epidural injection therefore we will not repeat at the current time.  RTC 2 weeks for MBB

## 2012-07-10 ENCOUNTER — Ambulatory Visit: Payer: Medicare Other | Admitting: Physical Medicine & Rehabilitation

## 2012-07-20 ENCOUNTER — Ambulatory Visit: Payer: Medicare Other | Attending: Physical Medicine & Rehabilitation | Admitting: Physical Therapy

## 2012-07-20 DIAGNOSIS — IMO0001 Reserved for inherently not codable concepts without codable children: Secondary | ICD-10-CM | POA: Insufficient documentation

## 2012-07-20 DIAGNOSIS — M545 Low back pain, unspecified: Secondary | ICD-10-CM | POA: Insufficient documentation

## 2012-07-20 DIAGNOSIS — R269 Unspecified abnormalities of gait and mobility: Secondary | ICD-10-CM | POA: Insufficient documentation

## 2012-07-20 DIAGNOSIS — M2560 Stiffness of unspecified joint, not elsewhere classified: Secondary | ICD-10-CM | POA: Insufficient documentation

## 2012-07-30 ENCOUNTER — Encounter: Payer: Self-pay | Admitting: Physical Medicine & Rehabilitation

## 2012-07-30 ENCOUNTER — Ambulatory Visit: Payer: Medicare Other | Admitting: Physical Medicine & Rehabilitation

## 2012-07-30 NOTE — Progress Notes (Unsigned)
Patient is off Pradaxa only 2 days.  He will need to be off Pradaxa for at least 5 days prior to performing lumbar spine injection

## 2012-07-30 NOTE — Patient Instructions (Addendum)
Stop Pradaxa for a minimum of 5 days prior to procedure  You may take morphine before procedure

## 2012-07-30 NOTE — Progress Notes (Unsigned)
  PROCEDURE RECORD [facility]   Name: [name] DOB:1940-04-19 MRN: [id number]  Date:07/30/2012  Physician: Claudette Laws, MD    Nurse/CMA: Kelli Churn, CMA/Shumaker, RN  Allergies:  Allergies  Allergen Reactions  . Acetaminophen Other (See Comments)    LIVER INFLAMMATION IN HIGH DOSES    Consent Signed: yes  Is patient diabetic? no    Pregnant: no LMP: No LMP for male patient. (age 74-55)  Anticoagulants: yes (last dose pradaxa was 3 days ago) Anti-inflammatory: no Antibiotics: no  Procedure: Medial Branch Block  Position: Prone Start Time:   End Time:   Fluoro Time:   RN/CMA Levens, CMA     Time 1217     BP 174/71     Pulse 52     Respirations 14     O2 Sat 100     S/S 6     Pain Level 10/10      D/C home with wife, patient A & O X 3, D/C instructions reviewed, and sits independently.    Injection not done due to only off pradaxa for 3 days.

## 2012-08-03 ENCOUNTER — Ambulatory Visit: Payer: Medicare Other | Admitting: Internal Medicine

## 2012-08-06 ENCOUNTER — Encounter: Payer: Self-pay | Admitting: Physical Medicine & Rehabilitation

## 2012-08-06 ENCOUNTER — Encounter: Payer: Medicare Other | Attending: Physical Medicine & Rehabilitation

## 2012-08-06 ENCOUNTER — Ambulatory Visit (HOSPITAL_BASED_OUTPATIENT_CLINIC_OR_DEPARTMENT_OTHER): Payer: Medicare Other | Admitting: Physical Medicine & Rehabilitation

## 2012-08-06 VITALS — BP 169/76 | HR 59 | Resp 14 | Ht 70.0 in | Wt 186.6 lb

## 2012-08-06 DIAGNOSIS — M549 Dorsalgia, unspecified: Secondary | ICD-10-CM

## 2012-08-06 DIAGNOSIS — M961 Postlaminectomy syndrome, not elsewhere classified: Secondary | ICD-10-CM | POA: Insufficient documentation

## 2012-08-06 DIAGNOSIS — F1920 Other psychoactive substance dependence, uncomplicated: Secondary | ICD-10-CM | POA: Insufficient documentation

## 2012-08-06 DIAGNOSIS — M545 Low back pain, unspecified: Secondary | ICD-10-CM | POA: Insufficient documentation

## 2012-08-06 DIAGNOSIS — G8929 Other chronic pain: Secondary | ICD-10-CM | POA: Insufficient documentation

## 2012-08-06 DIAGNOSIS — M47817 Spondylosis without myelopathy or radiculopathy, lumbosacral region: Secondary | ICD-10-CM

## 2012-08-06 DIAGNOSIS — Z79899 Other long term (current) drug therapy: Secondary | ICD-10-CM | POA: Insufficient documentation

## 2012-08-06 DIAGNOSIS — Z96659 Presence of unspecified artificial knee joint: Secondary | ICD-10-CM | POA: Insufficient documentation

## 2012-08-06 DIAGNOSIS — M79609 Pain in unspecified limb: Secondary | ICD-10-CM | POA: Insufficient documentation

## 2012-08-06 DIAGNOSIS — G609 Hereditary and idiopathic neuropathy, unspecified: Secondary | ICD-10-CM | POA: Insufficient documentation

## 2012-08-06 MED ORDER — MORPHINE SULFATE ER 30 MG PO TBCR: 30.0000 mg | EXTENDED_RELEASE_TABLET | Freq: Two times a day (BID) | ORAL | Status: DC

## 2012-08-06 NOTE — Progress Notes (Signed)
  PROCEDURE RECORD [facility]   Name: [name] DOB:1940/08/03 MRN: [id number]  Date:08/06/2012  Physician: Claudette Laws, MD    Nurse/CMA: Shumaker RN  Allergies:  Allergies  Allergen Reactions  . Acetaminophen Other (See Comments)    LIVER INFLAMMATION IN HIGH DOSES    Consent Signed: yes  Is patient diabetic? no  CBG today?   Pregnant: no LMP: No LMP for male patient. (age 72-55)  Anticoagulants: yes ((Pradaxa-stopped this 07/30/12 for injection)) Anti-inflammatory: no Antibiotics: no  Procedure: Bilateral Medial Branch Blocks Position: Prone Start Time: 9:28 End Time: 9:39 Fluoro Time: 45 seconds  RN/CMA Designer, [employer]    Time 8:51 9:41    BP 169/76 155/88    Pulse 59 69    Respirations 14 14    O2 Sat 97 98    S/S 6 6    Pain Level 5/10 5/10     D/C home with Family, patient A & O X 3, D/C instructions reviewed, and sits independently.

## 2012-08-06 NOTE — Progress Notes (Addendum)
Bilateral Lumbar , L4  medial branch blocks and L 5 dorsal ramus injection under fluoroscopic guidance  Indication: Lumbar pain which is not relieved by medication management or other conservative care and interfering with self-care and mobility.  Informed consent was obtained after describing risks and benefits of the procedure with the patient, this includes bleeding, infection, paralysis and medication side effects.  The patient wishes to proceed and has given written consent.  The patient was placed in prone position.  The lumbar area was marked and prepped with Betadine.  One mL of 1% lidocaine was injected into each of 6 areas into the skin and subcutaneous tissue.  Then a 22-gauge 3.5 in spinal needle was inserted targeting the junction of the left S1 superior articular process and sacral ala junction. Needle was advanced under fluoroscopic guidance.  Bone contact was made.  Omnipaque 180 was injected x 0.5 mL demonstrating no intravascular uptake.  Then a solution containing one mL of 4 mg per mL dexamethasone and 3 mL of 2% MPF lidocaine was injected x 0.5 mL.  Then the left L5 superior articular process in transverse process junction was targeted.  Bone contact was made.  Omnipaque 180 was injected x 0.5 mL demonstrating no intravascular uptake. Then a solution containing one mL of 4 mg per mL dexamethasone and 3 mL of 2% MPF lidocaine was injected x 0.5 mL. This same procedure was performed on the right side using the same needle, technique and injectate.  Patient tolerated procedure well.  Post procedure instructions were given.

## 2012-08-06 NOTE — Patient Instructions (Signed)
Resume Pradaxa  tomorrow am

## 2012-08-18 ENCOUNTER — Telehealth: Payer: Self-pay | Admitting: *Deleted

## 2012-08-18 DIAGNOSIS — M549 Dorsalgia, unspecified: Secondary | ICD-10-CM

## 2012-08-18 NOTE — Telephone Encounter (Signed)
Can have pt follow up sooner with Clydie Braun. May cont TID MS Contin

## 2012-08-18 NOTE — Telephone Encounter (Signed)
Called with confusion about [name] morphine.  Apparently he was taking his morphine tid prior to [place] prescribing and he has been doing that.  He will not have enough to get through to the 09/08/12 appt with Dr Wynn Banker.  [place] but she will get the exact number he has and call us back.  Med history does show he was previously getting #90 instead of # 60.  Cordelia Pen has called back and he has #22 tablets left which would be accurate if taking tid instead of bid.. Please advise.

## 2012-08-19 NOTE — Telephone Encounter (Signed)
Notified Cordelia Pen that Mr Eisenhardt may take his morphine TID but must make an appointment to see Clydie Braun our PA to get the refill.

## 2012-08-24 ENCOUNTER — Ambulatory Visit: Payer: Medicare Other | Admitting: Internal Medicine

## 2012-08-24 ENCOUNTER — Ambulatory Visit (INDEPENDENT_AMBULATORY_CARE_PROVIDER_SITE_OTHER): Payer: Medicare Other | Admitting: Internal Medicine

## 2012-08-24 ENCOUNTER — Encounter: Payer: Self-pay | Admitting: Internal Medicine

## 2012-08-24 VITALS — BP 130/80 | HR 76 | Ht 68.0 in | Wt 189.6 lb

## 2012-08-24 DIAGNOSIS — G4733 Obstructive sleep apnea (adult) (pediatric): Secondary | ICD-10-CM

## 2012-08-24 MED ORDER — MORPHINE SULFATE ER 30 MG PO TBCR: 30.0000 mg | EXTENDED_RELEASE_TABLET | Freq: Three times a day (TID) | ORAL | Status: DC

## 2012-08-24 NOTE — Assessment & Plan Note (Signed)
Severe obstructive sleep apnea/ hypopnea syndrome. We discussed his lack of success with CPAP before. Bccause of dentures, CPAP is again the best choice.We discussed physiology, medical concerns, safe driving and treatment options.

## 2012-08-24 NOTE — Progress Notes (Signed)
Subjective:    Patient ID: [name], male    DOB: 27-Jun-1940, 72 y.o.   MRN: [id number]  HPI 08/24/12- 69 yoM former smoker referred courtesy of Dr Lovell Sheehan for OSA He had original sleep study in 1980's, but couldn't get used to it. Wife has been telling him that loud snore is worse, and he admits being tired in daytime. NPSG 4/24 /14- AHI 32/ hr   Review of Systems  Constitutional: Positive for unexpected weight change. Negative for fever.  HENT: Positive for congestion and postnasal drip. Negative for ear pain, nosebleeds, sore throat, rhinorrhea, sneezing, trouble swallowing, dental problem and sinus pressure.   Eyes: Negative for redness and itching.  Respiratory: Negative for cough, chest tightness, shortness of breath and wheezing.   Cardiovascular: Negative for palpitations and leg swelling.  Gastrointestinal: Negative for nausea and vomiting.  Genitourinary: Negative for dysuria.  Musculoskeletal: Negative for joint swelling.  Skin: Negative for rash.  Neurological: Positive for headaches.  Hematological: Does not bruise/bleed easily.  Psychiatric/Behavioral: Positive for sleep disturbance. Negative for dysphoric mood. The patient is not nervous/anxious.    Prior to Admission medications   Medication Sig Start Date End Date Taking? Authorizing Provider  ALPRAZolam (XANAX) 0.25 MG tablet Take 0.25 mg by mouth 3 (three) times daily.   Yes Historical Provider, MD  aspirin EC 81 MG tablet Take 81 mg by mouth daily.   Yes Historical Provider, MD  Azelastine-Fluticasone (DYMISTA) 137-50 MCG/ACT SUSP Place 2 sprays into the nose 2 (two) times daily. 06/18/12  Yes Stacie Glaze, MD  BEPREVE 1.5 % SOLN  06/19/12  Yes Historical Provider, MD  DIGOX 125 MCG tablet Take 1 tablet by mouth daily. 05/06/12  Yes Historical Provider, MD  erythromycin ophthalmic ointment  06/29/12  Yes Historical Provider, MD  escitalopram (LEXAPRO) 20 MG tablet  07/24/12  Yes Historical Provider, MD  gabapentin  (NEURONTIN) 400 MG capsule Take 400 mg by mouth 3 (three) times daily. 01/21/12 01/20/13 Yes Stacie Glaze, MD  lisinopril (PRINIVIL,ZESTRIL) 20 MG tablet Take 2 tablets (40 mg total) by mouth daily. 04/15/12  Yes Pamella Pert, MD  morphine (MS CONTIN) 30 MG 12 hr tablet Take 1 tablet (30 mg total) by mouth 2 (two) times daily. 08/06/12  Yes Erick Colace, MD  NITROSTAT 0.4 MG SL tablet Place 1 tablet under the tongue as needed. q 5 minutes x 3 05/22/12  Yes Historical Provider, MD  omeprazole (PRILOSEC) 20 MG capsule Take 1 capsule by mouth daily. 06/19/12  Yes Historical Provider, MD  PRADAXA 150 MG CAPS  07/27/12  Yes Historical Provider, MD  simvastatin (ZOCOR) 40 MG tablet Take 40 mg by mouth at bedtime.   Yes Historical Provider, MD  ZYLET 0.5-0.3 % SUSP  07/21/12  Yes Historical Provider, MD   Past Medical History  Diagnosis Date  . Hypertension   . Hyperlipidemia   . Preoperative cardiovascular examination   . Spinal stenosis, lumbar   . CAS (cerebral atherosclerosis)   . Erectile dysfunction   . Preventative health care   . Benign prostatic hypertrophy with urinary obstruction   . Family history of early CAD     male 1st degree relative <50  . Peptic ulcer disease   . Low back pain   . Superficial spreading melanoma   . Sleep apnea, obstructive     CPAP-does not use  . HA (headache)     sinus headaches  . Arthritis     knees  .  Myocardial infarction 1997    Hx MI 1997 and 1998  . Anxiety   . Pneumonia 2005  . Anemia   . Depression     takes Zoloft  . Neuropathy, autonomic, idiopathic peripheral, other   . Blood transfusion 1 yr. ago    jerking,fever-after blood transfusion  . Blood transfusion     given wrong blood  . Coronary artery disease 1998    cardiac stent/ Clearance Dr Lovell Sheehan and Jacinto Halim with note on chart  . Dysrhythmia     hx of atrial fib per ov note of 12/12   . Bradycardia     hx of bradycardia in past per ov note of 12/12  . GERD (gastroesophageal  reflux disease)    Past Surgical History  Procedure Laterality Date  . Esophagogastroduodenoscopy  2004  . Colonoscopy  2004  . Lumbar laminectomy    . Lumbar fusion    . Knee arthroscopy      left  . Shoulder arthroscopy      right  . Knee arthroscopy      right  . Decompression of the median nerve      left wrist and hand  . Revision of tkr  2011    on left  . Coronary stent placement    . Back surgery  2005-last    lumbar x2  . Excisional total knee arthroplasty  12/25/2010    Procedure: EXCISIONAL TOTAL KNEE ARTHROPLASTY;  Surgeon: Shelda Pal;  Location: [facility];  Service: Orthopedics;  Laterality: Right;  repeat irrigation   and debridement, removal and reinsertation of spacer block  . Joint replacement  2011    left knee x 2 ; 08/2010-right knee-infected  . I&d extremity  03/25/2011    Procedure: IRRIGATION AND DEBRIDEMENT EXTREMITY;  Surgeon: Shelda Pal, MD;  Location: [facility];  Service: Orthopedics;  Laterality: Right;  . Coronary angioplasty  1998    stents 1998   . Total knee revision  05/13/2011    Procedure: TOTAL KNEE REVISION;  Surgeon: Shelda Pal, MD;  Location: [facility];  Service: Orthopedics;  Laterality: Right;  Reimplantation   Family History  Problem Relation Age of Onset  . Heart disease Father   . Heart disease Mother   . Diabetes Sister   . Heart disease Brother    History   Social History  . Marital Status: Married    Spouse Name: N/A    Number of Children: N/A  . Years of Education: N/A   Occupational History  . diesel repair     part time  . worked at [employer] History Main Topics  . Smoking status: Former Smoker -- 1.00 packs/day for 15 years    Types: Cigars    Quit date: 12/23/2009  . Smokeless tobacco: Never Used     Comment: smoked a pipe for 6-7 y ears  . Alcohol Use: No  . Drug Use: No  . Sexually Active: Not on file   Other Topics Concern  . Not on file   Social History Narrative  . No narrative on file    OBJ- Physical Exam General- Alert, Oriented, Affect-appropriate, Distress- none acute, medium build Skin- rash-none, lesions- none, excoriation- none Lymphadenopathy- none Head- atraumatic            Eyes- Gross vision intact, PERRLA, conjunctivae and secretions clear            Ears- Hearing, canals-normal  Nose- Clear, no-Septal dev, mucus, polyps, erosion, perforation             Throat- Mallampati II-III , mucosa clear , drainage- none, tonsils- atrophic. +Dentures Neck- flexible , trachea midline, no stridor , thyroid nl, carotid no bruit Chest - symmetrical excursion , unlabored           Heart/CV- RRR , no murmur , no gallop  , no rub, nl s1 s2                           - JVD- none , edema- none, stasis changes- none, varices- none           Lung- clear to P&A, wheeze- none, cough- none , dullness-none, rub- none           Chest wall-  Abd- tender-no, distended-no, bowel sounds-present, HSM- no Br/ Gen/ Rectal- Not done, not indicated Extrem- cyanosis- none, clubbing, none, atrophy- none, strength- nl Neuro- grossly intact to observation       Objective:   Physical Exam        Assessment & Plan:

## 2012-08-24 NOTE — Telephone Encounter (Signed)
Mr Greenlaw called and will be out of medication Thursday.  Clydie Braun is on vacation and no appointments available.  He has the appointment with Dr Wynn Banker on the 22 of this month.  Rx printed to be signed.

## 2012-08-24 NOTE — Progress Notes (Signed)
08/24/12- 31 yoM former smoker referred courtesy of Dr Lovell Sheehan FOLLOWS FOR: Epworth score:12 -- patient reports holding breath while sleep, states this has been occuring since mid 80's had CPAP but could not wear it because mask was uncomfortable NPSG 05/24/12- 32.1/ hr

## 2012-08-24 NOTE — Patient Instructions (Addendum)
Order- DME new CPAP autotitrate 5-20 x 7 days for pressure recommendation, mask of choice, humidifier, supplies   Dx OSA  Please call as needed

## 2012-08-24 NOTE — Addendum Note (Signed)
Addended by: Doreene Eland on: 08/24/2012 02:03 PM   Modules accepted: Orders

## 2012-08-27 ENCOUNTER — Telehealth: Payer: Self-pay | Admitting: Internal Medicine

## 2012-08-27 NOTE — Telephone Encounter (Signed)
Spoke with Larita Fife at [facility] and she verified that they have received the order and they have to order the machine and they will contact [name] today or tomorrow.  Spoke with [name] and advised of conversation with [facility].  He verbalized understanding.

## 2012-09-07 ENCOUNTER — Telehealth: Payer: Self-pay | Admitting: *Deleted

## 2012-09-07 ENCOUNTER — Other Ambulatory Visit: Payer: Self-pay | Admitting: *Deleted

## 2012-09-07 NOTE — Telephone Encounter (Signed)
error 

## 2012-09-08 ENCOUNTER — Ambulatory Visit (HOSPITAL_BASED_OUTPATIENT_CLINIC_OR_DEPARTMENT_OTHER): Payer: Medicare Other | Admitting: Physical Medicine & Rehabilitation

## 2012-09-08 ENCOUNTER — Encounter: Payer: Self-pay | Admitting: Physical Medicine & Rehabilitation

## 2012-09-08 ENCOUNTER — Encounter: Payer: Medicare Other | Attending: Physical Medicine & Rehabilitation

## 2012-09-08 VITALS — BP 127/73 | HR 50 | Resp 14 | Ht 68.0 in | Wt 190.8 lb

## 2012-09-08 DIAGNOSIS — M48061 Spinal stenosis, lumbar region without neurogenic claudication: Secondary | ICD-10-CM

## 2012-09-08 DIAGNOSIS — M21371 Foot drop, right foot: Secondary | ICD-10-CM | POA: Insufficient documentation

## 2012-09-08 DIAGNOSIS — M549 Dorsalgia, unspecified: Secondary | ICD-10-CM

## 2012-09-08 DIAGNOSIS — Z96659 Presence of unspecified artificial knee joint: Secondary | ICD-10-CM | POA: Insufficient documentation

## 2012-09-08 DIAGNOSIS — Z79899 Other long term (current) drug therapy: Secondary | ICD-10-CM | POA: Insufficient documentation

## 2012-09-08 DIAGNOSIS — M79609 Pain in unspecified limb: Secondary | ICD-10-CM | POA: Insufficient documentation

## 2012-09-08 DIAGNOSIS — G609 Hereditary and idiopathic neuropathy, unspecified: Secondary | ICD-10-CM | POA: Insufficient documentation

## 2012-09-08 DIAGNOSIS — M545 Low back pain, unspecified: Secondary | ICD-10-CM | POA: Insufficient documentation

## 2012-09-08 DIAGNOSIS — G8929 Other chronic pain: Secondary | ICD-10-CM

## 2012-09-08 DIAGNOSIS — M961 Postlaminectomy syndrome, not elsewhere classified: Secondary | ICD-10-CM | POA: Insufficient documentation

## 2012-09-08 DIAGNOSIS — F1920 Other psychoactive substance dependence, uncomplicated: Secondary | ICD-10-CM | POA: Insufficient documentation

## 2012-09-08 DIAGNOSIS — M216X9 Other acquired deformities of unspecified foot: Secondary | ICD-10-CM

## 2012-09-08 DIAGNOSIS — M47817 Spondylosis without myelopathy or radiculopathy, lumbosacral region: Secondary | ICD-10-CM | POA: Insufficient documentation

## 2012-09-08 MED ORDER — MORPHINE SULFATE ER 30 MG PO TBCR: 30.0000 mg | EXTENDED_RELEASE_TABLET | Freq: Three times a day (TID) | ORAL | Status: DC

## 2012-09-08 NOTE — Patient Instructions (Addendum)
Stop pradaxa 5 days prior to sacroiliac injection May take one aspirin a day when off pradaxa  Prescription for ankle splints to help with foot drop

## 2012-09-08 NOTE — Progress Notes (Signed)
Subjective:    Patient ID: [name], male    DOB: 09-03-1940, 72 y.o.   MRN: [id number]  HPI Bilateral Lumbar , L4 medial branch blocks and L 5 dorsal ramus injection under fluoroscopic guidance June 19th helped with low back 100% Still has tailbone that radiates to ankle Reviewed imaging 08/12/2011. Stenosis left L2-L3 and L3-L4 which could comprise left L2 and left L3 nerve roots. Has had injection of those routes with only 1 day of relief. Also stenosis right L3-4 and right L4-5. No central stenosis at the L5-S1 level.  Surgical notes 02/12/2006 central and lateral decompression L2-L3 L3-L4 and L4-L5  Surgical notes 06/18/2001 central and lateral decompression L3-L4 and L4-L5 Cervical MRI 07/24/2002 spondylosis C4-C5 right C5-C6 spondylosis mild spondylosis C6-C7. Potential impingement on bilateral C5 and right C6 nerve root orthopedics  Reviewed orthopedic notes from 2003-2014 Pain Inventory Average Pain 6 Pain Right Now 6 My pain is constant and aching  In the last 24 hours, has pain interfered with the following? General activity 7 Relation with others 5 Enjoyment of life 9 What TIME of day is your pain at its worst? night Sleep (in general) Poor  Pain is worse with: standing Pain improves with: rest and medication Relief from Meds: 8  Mobility walk with assistance use a cane use a walker do you drive?  yes  Function retired I need assistance with the following:  meal prep, household duties and shopping  Neuro/Psych bladder control problems weakness numbness depression  Prior Studies Any changes since last visit?  no  Physicians involved in your care Any changes since last visit?  no   Family History  Problem Relation Age of Onset  . Heart disease Father   . Heart disease Mother   . Diabetes Sister   . Heart disease Brother    History   Social History  . Marital Status: Married    Spouse Name: N/A    Number of Children: N/A  . Years of  Education: N/A   Occupational History  . diesel repair     part time  . worked at [employer] History Main Topics  . Smoking status: Former Smoker -- 1.00 packs/day for 15 years    Types: Cigars    Quit date: 12/23/2009  . Smokeless tobacco: Never Used     Comment: smoked a pipe for 6-7 y ears  . Alcohol Use: No  . Drug Use: No  . Sexually Active: None   Other Topics Concern  . None   Social History Narrative  . None   Past Surgical History  Procedure Laterality Date  . Esophagogastroduodenoscopy  2004  . Colonoscopy  2004  . Lumbar laminectomy    . Lumbar fusion    . Knee arthroscopy      left  . Shoulder arthroscopy      right  . Knee arthroscopy      right  . Decompression of the median nerve      left wrist and hand  . Revision of tkr  2011    on left  . Coronary stent placement    . Back surgery  2005-last    lumbar x2  . Excisional total knee arthroplasty  12/25/2010    Procedure: EXCISIONAL TOTAL KNEE ARTHROPLASTY;  Surgeon: Shelda Pal;  Location: [facility];  Service: Orthopedics;  Laterality: Right;  repeat irrigation   and debridement, removal and reinsertation of spacer block  . Joint replacement  2011  left knee x 2 ; 08/2010-right knee-infected  . I&d extremity  03/25/2011    Procedure: IRRIGATION AND DEBRIDEMENT EXTREMITY;  Surgeon: Shelda Pal, MD;  Location: [facility];  Service: Orthopedics;  Laterality: Right;  . Coronary angioplasty  1998    stents 1998   . Total knee revision  05/13/2011    Procedure: TOTAL KNEE REVISION;  Surgeon: Shelda Pal, MD;  Location: [facility];  Service: Orthopedics;  Laterality: Right;  Reimplantation   Past Medical History  Diagnosis Date  . Hypertension   . Hyperlipidemia   . Preoperative cardiovascular examination   . Spinal stenosis, lumbar   . CAS (cerebral atherosclerosis)   . Erectile dysfunction   . Preventative health care   . Benign prostatic hypertrophy with urinary obstruction   . Family history  of early CAD     male 1st degree relative <50  . Peptic ulcer disease   . Low back pain   . Superficial spreading melanoma   . Sleep apnea, obstructive     CPAP-does not use  . HA (headache)     sinus headaches  . Arthritis     knees  . Myocardial infarction 1997    Hx MI 1997 and 1998  . Anxiety   . Pneumonia 2005  . Anemia   . Depression     takes Zoloft  . Neuropathy, autonomic, idiopathic peripheral, other   . Blood transfusion 1 yr. ago    jerking,fever-after blood transfusion  . Blood transfusion     given wrong blood  . Coronary artery disease 1998    cardiac stent/ Clearance Dr Lovell Sheehan and Jacinto Halim with note on chart  . Dysrhythmia     hx of atrial fib per ov note of 12/12   . Bradycardia     hx of bradycardia in past per ov note of 12/12  . GERD (gastroesophageal reflux disease)    BP 127/73  Pulse 50  Resp 14  Ht 5\' 8"  (1.727 m)  Wt 190 lb 12.8 oz (86.546 kg)  BMI 29.02 kg/m2  SpO2 92%   Review of Systems  Gastrointestinal: Positive for abdominal pain and constipation.  Genitourinary:       Bladder control problems  Neurological: Positive for weakness and numbness.  Hematological: Bruises/bleeds easily.  Psychiatric/Behavioral: Positive for dysphoric mood.  All other systems reviewed and are negative.       Objective:   Physical Exam  Tenderness along the left PSIS Negative straight leg raising Normal strength in the hip flexors and knee extensors Bilateral foot drop 2 minus strength in bilateral ankle dorsiflexors Decreased sensation on the dorsum of bilateral feet      Assessment & Plan:  1.  lumbar spondylosis improved after medial branch blocks 2. Tailbone pain radiating into the left thigh and into the ankle. Has not responded to selective nerve root blocks. This pain may be related to sacroiliac joint and will do a diagnostic/therapeutic injection Will need to come off a pradaxa for 5 days prior to this. May take a baby aspirin or regular  aspirin once a day when off Pradaxa 3. Bilateral foot drop related to chronic radiculitis the from spinal stenosis. Bilateral AFO ordered  Over half of the 25 min visit was spent counseling and coordinating care.

## 2012-09-09 ENCOUNTER — Other Ambulatory Visit: Payer: Self-pay | Admitting: *Deleted

## 2012-09-09 MED ORDER — GABAPENTIN 400 MG PO CAPS: 400.0000 mg | ORAL_CAPSULE | Freq: Three times a day (TID) | ORAL | Status: DC

## 2012-09-15 ENCOUNTER — Telehealth: Payer: Self-pay | Admitting: Internal Medicine

## 2012-09-15 NOTE — Telephone Encounter (Signed)
Contacted [facility] and they had the incorrect phone number. Had attempted to contact the patient 4 times. Gave [facility] the correct phone number and [facility] contacted patient and [facility] has scheduled CPAP set up for tonight at 7:00 pm. Nothing else needed at this time. Rhonda J Cobb

## 2012-10-01 ENCOUNTER — Encounter: Payer: Self-pay | Admitting: Internal Medicine

## 2012-10-01 ENCOUNTER — Ambulatory Visit (INDEPENDENT_AMBULATORY_CARE_PROVIDER_SITE_OTHER): Payer: Medicare Other | Admitting: Internal Medicine

## 2012-10-01 VITALS — BP 130/80 | HR 58 | Ht 68.0 in | Wt 187.2 lb

## 2012-10-01 DIAGNOSIS — J309 Allergic rhinitis, unspecified: Secondary | ICD-10-CM

## 2012-10-01 DIAGNOSIS — G4733 Obstructive sleep apnea (adult) (pediatric): Secondary | ICD-10-CM

## 2012-10-01 NOTE — Progress Notes (Signed)
  Subjective:    Patient ID: [name], male    DOB: [birth year], 72 y.o.   MRN: [id number]  HPI 08/24/12- 72 yoM former smoker referred courtesy of Dr Lovell Sheehan for OSA He had original sleep study in 1980's, but couldn't get used to it. Wife has been telling him that loud snore is worse, and he admits being tired in daytime. NPSG 4/24 /14- AHI 32/ hr  10/01/12- 72 yoM former smoker referred courtesy of Dr Lovell Sheehan for OSA FOLLOWS FOR: started on CPAP auto/ [facility] first time last night; has sinus headache today History of frequent nasal congestion usually treated with Benadryl. Does have Flonase. Not sure if this is why he had headache after CPAP.  ROS-see HPI Constitutional:   No-   weight loss, night sweats, fevers, chills, fatigue, lassitude. HEENT:   + headaches, no-difficulty swallowing, tooth/dental problems, sore throat,       No-  sneezing, itching, ear ache, +-nasal congestion, post nasal drip,  CV:  No-   chest pain, orthopnea, PND, swelling in lower extremities, anasarca,  dizziness, palpitations Resp: No-   shortness of breath with exertion or at rest.              No-   productive cough,  No non-productive cough,  No- coughing up of blood.              No-   change in color of mucus.  No- wheezing.   Skin: No-   rash or lesions. GI:  No-   heartburn, indigestion, abdominal pain, nausea, vomiting, GU:  MS:  No-   joint pain or swelling.   Neuro-     nothing unusual Psych:  No- change in mood or affect. No depression or anxiety.  No memory loss.   Objective:   Physical Exam OBJ- Physical Exam General- Alert, Oriented, Affect-appropriate, Distress- none acute, medium build Skin- rash-none, lesions- none, excoriation- none Lymphadenopathy- none Head- atraumatic            Eyes- Gross vision intact, PERRLA, conjunctivae and secretions clear            Ears- Hearing, canals-normal            Nose- +crusting, no-Septal dev, mucus, polyps, erosion, perforation             Throat-  Mallampati II-III , mucosa clear , drainage- none, tonsils- atrophic. +Dentures Neck- flexible , trachea midline, no stridor , thyroid nl, carotid no bruit Chest - symmetrical excursion , unlabored           Heart/CV- RRR , no murmur , no gallop  , no rub, nl s1 s2                           - JVD- none , edema- none, stasis changes- none, varices- none           Lung- clear to P&A, wheeze- none, cough- none , dullness-none, rub- none           Chest wall-  Abd- Br/ Gen/ Rectal- Not done, not indicated Extrem- cyanosis- none, clubbing, none, atrophy- none, strength- nl. +Using a walker Neuro- grossly intact to observation        Assessment & Plan:

## 2012-10-01 NOTE — Patient Instructions (Addendum)
We are waiting for D[employer] [facility] to send [place] a download about your use experience with CPAP, so that we know what fixed pressure to try.  Try using your Flonase nasal spray every day   1-2 puffs each nostril once daily at bedtime, for stretches of time when you are having sinus trouble. It is intended for maintenance use.  Neb neo nasal  Depo 80

## 2012-10-19 NOTE — Assessment & Plan Note (Signed)
Plan-nasal nebulizer decongestant, Depo-Medrol, daily Flonase

## 2012-10-19 NOTE — Assessment & Plan Note (Signed)
CPAP auto [facility]. We are awaiting download. Not sure if headache is related to this. He will use Flonase daily

## 2012-10-22 ENCOUNTER — Ambulatory Visit (HOSPITAL_BASED_OUTPATIENT_CLINIC_OR_DEPARTMENT_OTHER): Payer: Medicare Other | Admitting: Physical Medicine & Rehabilitation

## 2012-10-22 ENCOUNTER — Encounter: Payer: Medicare Other | Attending: Physical Medicine & Rehabilitation

## 2012-10-22 ENCOUNTER — Encounter: Payer: Self-pay | Admitting: Physical Medicine & Rehabilitation

## 2012-10-22 VITALS — BP 131/72 | HR 51 | Resp 16 | Ht 68.0 in | Wt 183.0 lb

## 2012-10-22 DIAGNOSIS — M545 Low back pain, unspecified: Secondary | ICD-10-CM | POA: Insufficient documentation

## 2012-10-22 DIAGNOSIS — M79609 Pain in unspecified limb: Secondary | ICD-10-CM | POA: Insufficient documentation

## 2012-10-22 DIAGNOSIS — G609 Hereditary and idiopathic neuropathy, unspecified: Secondary | ICD-10-CM | POA: Insufficient documentation

## 2012-10-22 DIAGNOSIS — Z96659 Presence of unspecified artificial knee joint: Secondary | ICD-10-CM | POA: Insufficient documentation

## 2012-10-22 DIAGNOSIS — F1920 Other psychoactive substance dependence, uncomplicated: Secondary | ICD-10-CM | POA: Insufficient documentation

## 2012-10-22 DIAGNOSIS — Z79899 Other long term (current) drug therapy: Secondary | ICD-10-CM | POA: Insufficient documentation

## 2012-10-22 DIAGNOSIS — M961 Postlaminectomy syndrome, not elsewhere classified: Secondary | ICD-10-CM | POA: Insufficient documentation

## 2012-10-22 DIAGNOSIS — G8929 Other chronic pain: Secondary | ICD-10-CM | POA: Insufficient documentation

## 2012-10-22 DIAGNOSIS — M533 Sacrococcygeal disorders, not elsewhere classified: Secondary | ICD-10-CM

## 2012-10-22 DIAGNOSIS — M549 Dorsalgia, unspecified: Secondary | ICD-10-CM

## 2012-10-22 DIAGNOSIS — M47817 Spondylosis without myelopathy or radiculopathy, lumbosacral region: Secondary | ICD-10-CM | POA: Insufficient documentation

## 2012-10-22 MED ORDER — MORPHINE SULFATE ER 30 MG PO TBCR: 30.0000 mg | EXTENDED_RELEASE_TABLET | Freq: Three times a day (TID) | ORAL | Status: DC

## 2012-10-22 NOTE — Progress Notes (Signed)
  PROCEDURE RECORD [facility]   Name: [name] DOB:[birth year] MRN: [id number]  Date:10/22/2012  Physician: Claudette Laws, MD    Nurse/CMA: Tesa Meadors,CMA(AAMA)/Shumaker, RN  Allergies:  Allergies  Allergen Reactions  . Acetaminophen Other (See Comments)    LIVER INFLAMMATION IN HIGH DOSES    Consent Signed: yes  Is patient diabetic? no    Pregnant: no LMP: No LMP for male patient. (age [age])  Anticoagulants: Took last dose 10/16/12 Anti-inflammatory: no Antibiotics: Penecillin and doxycycline 10/13/12 for tick bite  Procedure: bilateral Sacroiliac injection  Position: Prone Start Time: 10:18  End Time: 10:27  Fluoro Time: 29 seconds  RN/CMA Ismeal Heider, CMA Shumaker, RN    Time 948 10:32    BP 131/72 158/82    Pulse 51 64    Respirations 16 16    O2 Sat 98 98    S/S 6 6    Pain Level 4/10 2/10     D/C home with [name]-wife, patient A & O X 3, D/C instructions reviewed, and sits independently.

## 2012-10-22 NOTE — Progress Notes (Signed)
Bilateral sacroiliac injections under fluoroscopic guidance  Indication: Low back and buttocks pain not relieved by medication management and other conservative care.  Informed consent was obtained after describing risks and benefits of the procedure with the patient, this includes bleeding, bruising, infection, paralysis and medication side effects. The patient wishes to proceed and has given written consent. The patient was placed in a prone position. The lumbar and sacral area was marked and prepped with Betadine. A 25-gauge 1-1/2 inch needle was inserted into the skin and subcutaneous tissue and 1 mL of 1% lidocaine was injected into each side. Then a 25-gauge 3 inch spinal needle was inserted under fluoroscopic guidance into the left sacroiliac joint. AP and lateral images were utilized. Omnipaque 180x0.5 mL under live fluoroscopy demonstrated no intravascular uptake. Then a solution containing one ML of 40 mg per mL Depakote met drawl in 2 ML of 2% lidocaine MPF was injected x1.5 mL. This same procedure was repeated on the right side using the same needle, injectate, and technique. Patient tolerated the procedure well. Post procedure instructions were given. Please see post procedure form. 

## 2012-10-22 NOTE — Patient Instructions (Addendum)
May resume Pradaxa tomorrow  We did a bilateral sacroiliac injection for tailbone and buttocks pain today You will have a lump and a bruise in the left buttocks area.  Apply ice when you get home for 30 minutes every 2 hours  Next injection will repeat the spie arthritis injection we did in June, it has started to wear off Need to be off Pradaxa 5-7  Days prior to next injection

## 2012-10-26 ENCOUNTER — Other Ambulatory Visit: Payer: Self-pay

## 2012-10-26 MED ORDER — OMEPRAZOLE 20 MG PO CPDR: 20.0000 mg | DELAYED_RELEASE_CAPSULE | Freq: Every day | ORAL | Status: DC

## 2012-10-30 ENCOUNTER — Telehealth: Payer: Self-pay

## 2012-10-30 NOTE — Telephone Encounter (Signed)
Patient called stating the shot helped but now he is have spasms and would like to know what to do?

## 2012-10-30 NOTE — Telephone Encounter (Signed)
Patient called back regarding his spasms and said disregard his original message he is doing ok now.

## 2012-11-17 ENCOUNTER — Ambulatory Visit: Payer: Medicare Other | Admitting: Internal Medicine

## 2012-11-18 ENCOUNTER — Telehealth: Payer: Self-pay

## 2012-11-18 DIAGNOSIS — M549 Dorsalgia, unspecified: Secondary | ICD-10-CM

## 2012-11-18 MED ORDER — MORPHINE SULFATE ER 30 MG PO TBCR: 30.0000 mg | EXTENDED_RELEASE_TABLET | Freq: Three times a day (TID) | ORAL | Status: DC

## 2012-11-18 NOTE — Telephone Encounter (Signed)
Morphine refill printed and ready for Dr. Wynn Banker to sign. Called patient to let him know that RX would be ready on 11/19/12 and call before coming to pick up.

## 2012-11-18 NOTE — Telephone Encounter (Signed)
Patient called requesting pain medication refill.   

## 2012-11-19 ENCOUNTER — Ambulatory Visit: Payer: Medicare Other | Admitting: Physical Medicine & Rehabilitation

## 2012-11-20 ENCOUNTER — Encounter (HOSPITAL_BASED_OUTPATIENT_CLINIC_OR_DEPARTMENT_OTHER): Payer: Self-pay

## 2012-11-20 ENCOUNTER — Emergency Department (HOSPITAL_BASED_OUTPATIENT_CLINIC_OR_DEPARTMENT_OTHER)
Admission: EM | Admit: 2012-11-20 | Discharge: 2012-11-20 | Disposition: A | Discharge status: Home / Self Care | Payer: Medicare Other | Attending: Emergency Medicine | Admitting: Emergency Medicine

## 2012-11-20 DIAGNOSIS — Z862 Personal history of diseases of the blood and blood-forming organs and certain disorders involving the immune mechanism: Secondary | ICD-10-CM | POA: Insufficient documentation

## 2012-11-20 DIAGNOSIS — Z7982 Long term (current) use of aspirin: Secondary | ICD-10-CM | POA: Insufficient documentation

## 2012-11-20 DIAGNOSIS — F329 Major depressive disorder, single episode, unspecified: Secondary | ICD-10-CM | POA: Insufficient documentation

## 2012-11-20 DIAGNOSIS — F3289 Other specified depressive episodes: Secondary | ICD-10-CM | POA: Insufficient documentation

## 2012-11-20 DIAGNOSIS — Z79899 Other long term (current) drug therapy: Secondary | ICD-10-CM | POA: Insufficient documentation

## 2012-11-20 DIAGNOSIS — Z8701 Personal history of pneumonia (recurrent): Secondary | ICD-10-CM | POA: Insufficient documentation

## 2012-11-20 DIAGNOSIS — I252 Old myocardial infarction: Secondary | ICD-10-CM | POA: Insufficient documentation

## 2012-11-20 DIAGNOSIS — M129 Arthropathy, unspecified: Secondary | ICD-10-CM | POA: Insufficient documentation

## 2012-11-20 DIAGNOSIS — F411 Generalized anxiety disorder: Secondary | ICD-10-CM | POA: Insufficient documentation

## 2012-11-20 DIAGNOSIS — Z85820 Personal history of malignant melanoma of skin: Secondary | ICD-10-CM | POA: Insufficient documentation

## 2012-11-20 DIAGNOSIS — Z87891 Personal history of nicotine dependence: Secondary | ICD-10-CM | POA: Insufficient documentation

## 2012-11-20 DIAGNOSIS — I2510 Atherosclerotic heart disease of native coronary artery without angina pectoris: Secondary | ICD-10-CM | POA: Insufficient documentation

## 2012-11-20 DIAGNOSIS — Z8669 Personal history of other diseases of the nervous system and sense organs: Secondary | ICD-10-CM | POA: Insufficient documentation

## 2012-11-20 DIAGNOSIS — Z8711 Personal history of peptic ulcer disease: Secondary | ICD-10-CM | POA: Insufficient documentation

## 2012-11-20 DIAGNOSIS — Z87448 Personal history of other diseases of urinary system: Secondary | ICD-10-CM | POA: Insufficient documentation

## 2012-11-20 DIAGNOSIS — Z9861 Coronary angioplasty status: Secondary | ICD-10-CM | POA: Insufficient documentation

## 2012-11-20 DIAGNOSIS — K219 Gastro-esophageal reflux disease without esophagitis: Secondary | ICD-10-CM | POA: Insufficient documentation

## 2012-11-20 DIAGNOSIS — I10 Essential (primary) hypertension: Secondary | ICD-10-CM | POA: Insufficient documentation

## 2012-11-20 DIAGNOSIS — E785 Hyperlipidemia, unspecified: Secondary | ICD-10-CM | POA: Insufficient documentation

## 2012-11-20 NOTE — ED Notes (Signed)
Pt concerned elevated BP "212/1something"-c/o "sinus HA"-denies CP

## 2012-11-20 NOTE — ED Provider Notes (Signed)
CSN: [id number]     Arrival date & time 11/20/12  1943 History  This chart was scribed for Hilario Quarry, MD by Ronal Fear, ED Scribe. This patient was seen in room MH02/MH02 and the patient's care was started at 7:56 PM.    Chief Complaint  Patient presents with  . Hypertension    Patient is a 72 y.o. male presenting with hypertension. The history is provided by the patient. No language interpreter was used.  Hypertension This is a chronic problem. The current episode started 3 to 5 hours ago. Pertinent negatives include no chest pain, no abdominal pain, no headaches and no shortness of breath.  pt states that his BP spiked today, he states that he knew it went up because he didn't feel right. He says that he felt light headed at that time. He denied visual changes, chest pain, SOB and headaches that differ from his sinus HA's. Pt 's wife says that at highest the pt 's blood pressure was well over 200. He is currently stable at 180/91. The pt takes heart medication 3x a day, and morphine for his chronic back pain.  Pt has a hx of chronic lower back pain, Heart problems and 2 knee replacements.    Past Medical History  Diagnosis Date  . Hypertension   . Hyperlipidemia   . Preoperative cardiovascular examination   . Spinal stenosis, lumbar   . CAS (cerebral atherosclerosis)   . Erectile dysfunction   . Preventative health care   . Benign prostatic hypertrophy with urinary obstruction   . Family history of early CAD     male 1st degree relative <50  . Peptic ulcer disease   . Low back pain   . Superficial spreading melanoma   . Sleep apnea, obstructive     CPAP-does not use  . HA (headache)     sinus headaches  . Arthritis     knees  . Myocardial infarction 1997    Hx MI 1997 and 1998  . Anxiety   . Pneumonia 2005  . Anemia   . Depression     takes Zoloft  . Neuropathy, autonomic, idiopathic peripheral, other   . Blood transfusion 1 yr. ago    jerking,fever-after blood  transfusion  . Blood transfusion     given wrong blood  . Coronary artery disease 1998    cardiac stent/ Clearance Dr Lovell Sheehan and Jacinto Halim with note on chart  . Dysrhythmia     hx of atrial fib per ov note of 12/12   . Bradycardia     hx of bradycardia in past per ov note of 12/12  . GERD (gastroesophageal reflux disease)    Past Surgical History  Procedure Laterality Date  . Esophagogastroduodenoscopy  2004  . Colonoscopy  2004  . Lumbar laminectomy    . Lumbar fusion    . Knee arthroscopy      left  . Shoulder arthroscopy      right  . Knee arthroscopy      right  . Decompression of the median nerve      left wrist and hand  . Revision of tkr  2011    on left  . Coronary stent placement    . Back surgery  2005-last    lumbar x2  . Excisional total knee arthroplasty  12/25/2010    Procedure: EXCISIONAL TOTAL KNEE ARTHROPLASTY;  Surgeon: Shelda Pal;  Location: [facility];  Service: Orthopedics;  Laterality: Right;  repeat irrigation  and debridement, removal and reinsertation of spacer block  . Joint replacement  2011    left knee x 2 ; 08/2010-right knee-infected  . I&d extremity  03/25/2011    Procedure: IRRIGATION AND DEBRIDEMENT EXTREMITY;  Surgeon: Shelda Pal, MD;  Location: [facility];  Service: Orthopedics;  Laterality: Right;  . Coronary angioplasty  1998    stents 1998   . Total knee revision  05/13/2011    Procedure: TOTAL KNEE REVISION;  Surgeon: Shelda Pal, MD;  Location: [facility];  Service: Orthopedics;  Laterality: Right;  Reimplantation   Family History  Problem Relation Age of Onset  . Heart disease Father   . Heart disease Mother   . Diabetes Sister   . Heart disease Brother    History  Substance Use Topics  . Smoking status: Former Smoker -- 1.00 packs/day for 15 years    Types: Cigars    Quit date: 12/23/2009  . Smokeless tobacco: Never Used     Comment: smoked a pipe for 6-7 y ears  . Alcohol Use: No    Review of Systems  Constitutional:  Negative for fever.  HENT: Positive for sinus pressure.   Eyes: Negative for visual disturbance.  Respiratory: Negative for shortness of breath.   Cardiovascular: Negative for chest pain and leg swelling.  Gastrointestinal: Positive for nausea. Negative for vomiting and abdominal pain.  Neurological: Negative for headaches.  All other systems reviewed and are negative.    Allergies  Acetaminophen  Home Medications   Current Outpatient Rx  Name  Route  Sig  Dispense  Refill  . ALPRAZolam (XANAX) 0.25 MG tablet   Oral   Take 0.25 mg by mouth 3 (three) times daily.         Marland Kitchen aspirin EC 81 MG tablet   Oral   Take 81 mg by mouth daily.         . Azelastine-Fluticasone (DYMISTA) 137-50 MCG/ACT SUSP   Nasal   Place 2 sprays into the nose 2 (two) times daily.   23 g   6   . BEPREVE 1.5 % SOLN               . DIGOX 125 MCG tablet   Oral   Take 1 tablet by mouth daily.         Marland Kitchen erythromycin ophthalmic ointment               . escitalopram (LEXAPRO) 20 MG tablet               . gabapentin (NEURONTIN) 400 MG capsule   Oral   Take 1 capsule (400 mg total) by mouth 3 (three) times daily.   90 capsule   5   . lisinopril (PRINIVIL,ZESTRIL) 20 MG tablet   Oral   Take 2 tablets (40 mg total) by mouth daily.   30 tablet   2   . morphine (MS CONTIN) 30 MG 12 hr tablet   Oral   Take 1 tablet (30 mg total) by mouth 3 (three) times daily.   90 tablet   0     Do not fill prior to 09/24/2012   . NITROSTAT 0.4 MG SL tablet   Sublingual   Place 1 tablet under the tongue as needed. q 5 minutes x 3         . omeprazole (PRILOSEC) 20 MG capsule   Oral   Take 1 capsule (20 mg total) by mouth daily.   30  capsule   3   . PRADAXA 150 MG CAPS               . simvastatin (ZOCOR) 40 MG tablet   Oral   Take 40 mg by mouth at bedtime.         Marland Kitchen ZYLET 0.5-0.3 % SUSP                BP 180/91  Pulse 59  Temp(Src) 98.3 F (36.8 C) (Oral)  Resp  20  Ht 5\' 8"  (1.727 m)  Wt 184 lb (83.462 kg)  BMI 27.98 kg/m2  SpO2 99% Physical Exam  Nursing note and vitals reviewed. Constitutional: He is oriented to person, place, and time. He appears well-developed and well-nourished. No distress.  Pt does not seem to be in any acute distress with no other complaints.    HENT:  Head: Normocephalic and atraumatic.  Mouth/Throat: No oropharyngeal exudate.  Eyes: Conjunctivae and EOM are normal. Pupils are equal, round, and reactive to light.  Neck: Neck supple. No tracheal deviation present.  Cardiovascular: Normal rate, regular rhythm and normal heart sounds.   Pulmonary/Chest: Effort normal and breath sounds normal. No respiratory distress. He has no wheezes. He has no rales.  Abdominal: Soft. Bowel sounds are normal. There is no tenderness.  Musculoskeletal: Normal range of motion. He exhibits no tenderness.  Neurological: He is alert and oriented to person, place, and time. He displays normal reflexes. No cranial nerve deficit. He exhibits normal muscle tone. Coordination normal.  Strength normal at baseline. Gait normal at baseline  Skin: Skin is warm and dry.  Psychiatric: He has a normal mood and affect. His behavior is normal.    ED Course  Procedures (including critical care time)  .DIAGNOSTIC STUDIES: Oxygen Saturation is 99% on RA, normal by my interpretation.    COORDINATION OF CARE: 8:03 PM- Pt advised of plan for treatment including keeping a close eye on his BP, but there will be no changes in medications and pt agrees.      Labs Review Labs Reviewed - No data to display Imaging Review No results found.  MDM  No diagnosis found. I personally performed the services described in this documentation, which was scribed in my presence. The recorded information has been reviewed and considered.    Hilario Quarry, MD 11/20/12 2350

## 2012-11-23 ENCOUNTER — Encounter (HOSPITAL_BASED_OUTPATIENT_CLINIC_OR_DEPARTMENT_OTHER): Payer: Self-pay | Admitting: *Deleted

## 2012-11-23 ENCOUNTER — Emergency Department (HOSPITAL_BASED_OUTPATIENT_CLINIC_OR_DEPARTMENT_OTHER): Payer: Medicare Other

## 2012-11-23 ENCOUNTER — Emergency Department (HOSPITAL_BASED_OUTPATIENT_CLINIC_OR_DEPARTMENT_OTHER)
Admission: EM | Admit: 2012-11-23 | Discharge: 2012-11-23 | Disposition: A | Discharge status: Home / Self Care | Payer: Medicare Other | Attending: Emergency Medicine | Admitting: Emergency Medicine

## 2012-11-23 DIAGNOSIS — Z87448 Personal history of other diseases of urinary system: Secondary | ICD-10-CM | POA: Insufficient documentation

## 2012-11-23 DIAGNOSIS — F3289 Other specified depressive episodes: Secondary | ICD-10-CM | POA: Insufficient documentation

## 2012-11-23 DIAGNOSIS — Y9289 Other specified places as the place of occurrence of the external cause: Secondary | ICD-10-CM | POA: Insufficient documentation

## 2012-11-23 DIAGNOSIS — Z79899 Other long term (current) drug therapy: Secondary | ICD-10-CM | POA: Insufficient documentation

## 2012-11-23 DIAGNOSIS — Z8739 Personal history of other diseases of the musculoskeletal system and connective tissue: Secondary | ICD-10-CM | POA: Insufficient documentation

## 2012-11-23 DIAGNOSIS — K219 Gastro-esophageal reflux disease without esophagitis: Secondary | ICD-10-CM | POA: Insufficient documentation

## 2012-11-23 DIAGNOSIS — Z8701 Personal history of pneumonia (recurrent): Secondary | ICD-10-CM | POA: Insufficient documentation

## 2012-11-23 DIAGNOSIS — I2510 Atherosclerotic heart disease of native coronary artery without angina pectoris: Secondary | ICD-10-CM | POA: Insufficient documentation

## 2012-11-23 DIAGNOSIS — Z8711 Personal history of peptic ulcer disease: Secondary | ICD-10-CM | POA: Insufficient documentation

## 2012-11-23 DIAGNOSIS — G9009 Other idiopathic peripheral autonomic neuropathy: Secondary | ICD-10-CM | POA: Insufficient documentation

## 2012-11-23 DIAGNOSIS — F329 Major depressive disorder, single episode, unspecified: Secondary | ICD-10-CM | POA: Insufficient documentation

## 2012-11-23 DIAGNOSIS — D649 Anemia, unspecified: Secondary | ICD-10-CM | POA: Insufficient documentation

## 2012-11-23 DIAGNOSIS — S0180XA Unspecified open wound of other part of head, initial encounter: Secondary | ICD-10-CM | POA: Insufficient documentation

## 2012-11-23 DIAGNOSIS — W19XXXA Unspecified fall, initial encounter: Secondary | ICD-10-CM

## 2012-11-23 DIAGNOSIS — S0101XA Laceration without foreign body of scalp, initial encounter: Secondary | ICD-10-CM

## 2012-11-23 DIAGNOSIS — S0181XA Laceration without foreign body of other part of head, initial encounter: Secondary | ICD-10-CM

## 2012-11-23 DIAGNOSIS — Z9861 Coronary angioplasty status: Secondary | ICD-10-CM | POA: Insufficient documentation

## 2012-11-23 DIAGNOSIS — I252 Old myocardial infarction: Secondary | ICD-10-CM | POA: Insufficient documentation

## 2012-11-23 DIAGNOSIS — I10 Essential (primary) hypertension: Secondary | ICD-10-CM | POA: Insufficient documentation

## 2012-11-23 DIAGNOSIS — Z87891 Personal history of nicotine dependence: Secondary | ICD-10-CM | POA: Insufficient documentation

## 2012-11-23 DIAGNOSIS — Z23 Encounter for immunization: Secondary | ICD-10-CM | POA: Insufficient documentation

## 2012-11-23 DIAGNOSIS — S0100XA Unspecified open wound of scalp, initial encounter: Secondary | ICD-10-CM | POA: Insufficient documentation

## 2012-11-23 DIAGNOSIS — W1809XA Striking against other object with subsequent fall, initial encounter: Secondary | ICD-10-CM | POA: Insufficient documentation

## 2012-11-23 DIAGNOSIS — E785 Hyperlipidemia, unspecified: Secondary | ICD-10-CM | POA: Insufficient documentation

## 2012-11-23 DIAGNOSIS — Z7982 Long term (current) use of aspirin: Secondary | ICD-10-CM | POA: Insufficient documentation

## 2012-11-23 DIAGNOSIS — F411 Generalized anxiety disorder: Secondary | ICD-10-CM | POA: Insufficient documentation

## 2012-11-23 DIAGNOSIS — Y9389 Activity, other specified: Secondary | ICD-10-CM | POA: Insufficient documentation

## 2012-11-23 DIAGNOSIS — S0990XA Unspecified injury of head, initial encounter: Secondary | ICD-10-CM

## 2012-11-23 DIAGNOSIS — G4733 Obstructive sleep apnea (adult) (pediatric): Secondary | ICD-10-CM | POA: Insufficient documentation

## 2012-11-23 DIAGNOSIS — Z85820 Personal history of malignant melanoma of skin: Secondary | ICD-10-CM | POA: Insufficient documentation

## 2012-11-23 DIAGNOSIS — M129 Arthropathy, unspecified: Secondary | ICD-10-CM | POA: Insufficient documentation

## 2012-11-23 DIAGNOSIS — S060X0A Concussion without loss of consciousness, initial encounter: Secondary | ICD-10-CM | POA: Insufficient documentation

## 2012-11-23 MED ORDER — MORPHINE SULFATE 4 MG/ML IJ SOLN
8.0000 mg | Freq: Once | INTRAMUSCULAR | Status: AC
  Administered 2012-11-23: 8 mg via INTRAMUSCULAR
  Filled 2012-11-23: qty 2

## 2012-11-23 MED ORDER — TETANUS-DIPHTH-ACELL PERTUSSIS 5-2.5-18.5 LF-MCG/0.5 IM SUSP
0.5000 mL | Freq: Once | INTRAMUSCULAR | Status: AC
  Administered 2012-11-23: 0.5 mL via INTRAMUSCULAR
  Filled 2012-11-23: qty 0.5

## 2012-11-23 NOTE — ED Notes (Signed)
MD at bedside discussing test results and plan of care for DC. 

## 2012-11-23 NOTE — ED Notes (Signed)
Pt requested morphine for his back pain. Sts he takes Morphine 30mg  TID for chronic back pain and it was due at 6pm.  MD notified.  Order received. Waiting now for CT.

## 2012-11-23 NOTE — ED Notes (Addendum)
Pt reports falling in the kitchen today due to neuropathy at 430pm.  Reports that he went to his PCP and they put stitches in the lac above his (R) eye.  Denies LOC.  Noted to have a large hematoma above his eye.  Laceration about eye, on his nose and on the top of his head.  Reports that his PCP 'did not see the lac on his head' and pt has no stitches or staples in it.  Bleeding continues at this time.  No obvious arterial bleeding. Pt denies a HA but does report that he is on pradaxia. States that he is nauseated.

## 2012-11-23 NOTE — ED Notes (Signed)
MD at bedside. 

## 2012-11-23 NOTE — ED Provider Notes (Signed)
CSN: [id number]     Arrival date & time 11/23/12  1933 History  This chart was scribed for Charles B. Bernette Mayers, MD by Greggory Stallion, ED Scribe. This patient was seen in room MH11/MH11 and the patient's care was started at 8:11 PM.   Chief Complaint  Patient presents with  . Fall   The history is provided by the patient. No language interpreter was used.   HPI Comments: [name] is a 72 y.o. male who presents to the Emergency Department complaining of a fall that occurred earlier today around 4:30 PM. Pt states he is nauseated. He states he hit his head but denies LOC. Pt states he went to his PCP but they only put sutures in the laceration about his right eye. He states the PCP did not see the other laceration to his head. He does not remember when his last tetanus was. Pt denies headache and emesis.   Past Medical History  Diagnosis Date  . Hypertension   . Hyperlipidemia   . Preoperative cardiovascular examination   . Spinal stenosis, lumbar   . CAS (cerebral atherosclerosis)   . Erectile dysfunction   . Preventative health care   . Benign prostatic hypertrophy with urinary obstruction   . Family history of early CAD     male 1st degree relative <50  . Peptic ulcer disease   . Low back pain   . Superficial spreading melanoma   . Sleep apnea, obstructive     CPAP-does not use  . HA (headache)     sinus headaches  . Arthritis     knees  . Myocardial infarction 1997    Hx MI 1997 and 1998  . Anxiety   . Pneumonia 2005  . Anemia   . Depression     takes Zoloft  . Neuropathy, autonomic, idiopathic peripheral, other   . Blood transfusion 1 yr. ago    jerking,fever-after blood transfusion  . Blood transfusion     given wrong blood  . Coronary artery disease 1998    cardiac stent/ Clearance Dr Lovell Sheehan and Jacinto Halim with note on chart  . Dysrhythmia     hx of atrial fib per ov note of 12/12   . Bradycardia     hx of bradycardia in past per ov note of 12/12  . GERD  (gastroesophageal reflux disease)    Past Surgical History  Procedure Laterality Date  . Esophagogastroduodenoscopy  2004  . Colonoscopy  2004  . Lumbar laminectomy    . Lumbar fusion    . Knee arthroscopy      left  . Shoulder arthroscopy      right  . Knee arthroscopy      right  . Decompression of the median nerve      left wrist and hand  . Revision of tkr  2011    on left  . Coronary stent placement    . Back surgery  2005-last    lumbar x2  . Excisional total knee arthroplasty  12/25/2010    Procedure: EXCISIONAL TOTAL KNEE ARTHROPLASTY;  Surgeon: Shelda Pal;  Location: [facility];  Service: Orthopedics;  Laterality: Right;  repeat irrigation   and debridement, removal and reinsertation of spacer block  . Joint replacement  2011    left knee x 2 ; 08/2010-right knee-infected  . I&d extremity  03/25/2011    Procedure: IRRIGATION AND DEBRIDEMENT EXTREMITY;  Surgeon: Shelda Pal, MD;  Location: [facility];  Service: Orthopedics;  Laterality: Right;  . Coronary angioplasty  1998    stents 1998   . Total knee revision  05/13/2011    Procedure: TOTAL KNEE REVISION;  Surgeon: Shelda Pal, MD;  Location: [facility];  Service: Orthopedics;  Laterality: Right;  Reimplantation   Family History  Problem Relation Age of Onset  . Heart disease Father   . Heart disease Mother   . Diabetes Sister   . Heart disease Brother    History  Substance Use Topics  . Smoking status: Former Smoker -- 1.00 packs/day for 15 years    Types: Cigars    Quit date: 12/23/2009  . Smokeless tobacco: Never Used     Comment: smoked a pipe for 6-7 y ears  . Alcohol Use: No    Review of Systems A complete 10 system review of systems was obtained and all systems are negative except as noted in the HPI and PMH.   Allergies  Acetaminophen  Home Medications   Current Outpatient Rx  Name  Route  Sig  Dispense  Refill  . ALPRAZolam (XANAX) 0.25 MG tablet   Oral   Take 0.25 mg by mouth 3 (three) times  daily.         Marland Kitchen aspirin EC 81 MG tablet   Oral   Take 81 mg by mouth daily.         . Azelastine-Fluticasone (DYMISTA) 137-50 MCG/ACT SUSP   Nasal   Place 2 sprays into the nose 2 (two) times daily.   23 g   6   . BEPREVE 1.5 % SOLN               . DIGOX 125 MCG tablet   Oral   Take 1 tablet by mouth daily.         Marland Kitchen erythromycin ophthalmic ointment               . escitalopram (LEXAPRO) 20 MG tablet               . gabapentin (NEURONTIN) 400 MG capsule   Oral   Take 1 capsule (400 mg total) by mouth 3 (three) times daily.   90 capsule   5   . lisinopril (PRINIVIL,ZESTRIL) 20 MG tablet   Oral   Take 2 tablets (40 mg total) by mouth daily.   30 tablet   2   . morphine (MS CONTIN) 30 MG 12 hr tablet   Oral   Take 1 tablet (30 mg total) by mouth 3 (three) times daily.   90 tablet   0     Do not fill prior to 09/24/2012   . NITROSTAT 0.4 MG SL tablet   Sublingual   Place 1 tablet under the tongue as needed. q 5 minutes x 3         . omeprazole (PRILOSEC) 20 MG capsule   Oral   Take 1 capsule (20 mg total) by mouth daily.   30 capsule   3   . PRADAXA 150 MG CAPS               . simvastatin (ZOCOR) 40 MG tablet   Oral   Take 40 mg by mouth at bedtime.         Marland Kitchen ZYLET 0.5-0.3 % SUSP                BP 156/86  Pulse 73  Temp(Src) 98 F (36.7 C) (Oral)  Resp 18  Ht 5\' 8"  (  1.727 m)  Wt 185 lb (83.915 kg)  BMI 28.14 kg/m2  SpO2 97%  Physical Exam  Nursing note and vitals reviewed. Constitutional: He is oriented to person, place, and time. He appears well-developed and well-nourished.  HENT:  Head: Normocephalic.  Abrasion vs laceration to right parietal scalp. Laceration over right eyebrow repaired at PCP office, is not currently bleeding with a large hematoma under the area. Superficial abrasion to bridge of nose.   Eyes: EOM are normal. Pupils are equal, round, and reactive to light.  Neck: Normal range of motion. Neck  supple.  Cardiovascular: Normal rate, normal heart sounds and intact distal pulses.   Pulmonary/Chest: Effort normal and breath sounds normal.  Abdominal: Bowel sounds are normal. He exhibits no distension. There is no tenderness.  Musculoskeletal: Normal range of motion. He exhibits no edema and no tenderness.  Neurological: He is alert and oriented to person, place, and time. He has normal strength. No cranial nerve deficit or sensory deficit.  Skin: Skin is warm and dry. No rash noted.  Psychiatric: He has a normal mood and affect.    ED Course  Procedures (including critical care time)  DIAGNOSTIC STUDIES: Oxygen Saturation is 97% on RA, normal by my interpretation.    COORDINATION OF CARE: 8:13 PM-Discussed treatment plan which includes head CT with pt at bedside and pt agreed to plan.   Labs Review Labs Reviewed - No data to display Imaging Review Ct Head Wo Contrast  11/23/2012   CLINICAL DATA:  All fell with laceration to forehead and right side of head, on Pradaxa  EXAM: CT HEAD WITHOUT CONTRAST  TECHNIQUE: Contiguous axial images were obtained from the base of the skull through the vertex without intravenous contrast.  COMPARISON:  12/02/2009  FINDINGS: No skull fracture. No hemorrhage or extra-axial fluid. Significant diffuse atrophy is stable. There is right frontal scalp hematoma.  IMPRESSION: No acute intracranial abnormality.   Electronically Signed   By: Esperanza Heir M.D.   On: 11/23/2012 21:03    MDM   1. Fall, initial encounter   2. Laceration of face, initial encounter   3. Laceration of scalp, initial encounter   4. Head injury, acute, without loss of consciousness, initial encounter     CT head results reviewed. After cleaning scalp wound there is actually already a stitch in there. He has some minor oozing from the forehead laceration, but not severe enough to take down stitches. Advised PCP followup for suture removal. Given wound care instructions      I personally performed the services described in this documentation, which was scribed in my presence. The recorded information has been reviewed and is accurate.     Charles B. Bernette Mayers, MD 11/23/12 2236

## 2012-12-01 ENCOUNTER — Emergency Department (HOSPITAL_COMMUNITY): Payer: Medicare Other

## 2012-12-01 ENCOUNTER — Encounter (HOSPITAL_COMMUNITY): Payer: Self-pay | Admitting: Emergency Medicine

## 2012-12-01 ENCOUNTER — Inpatient Hospital Stay (HOSPITAL_COMMUNITY)
Admission: EM | Admit: 2012-12-01 | Discharge: 2012-12-02 | DRG: 311 | Disposition: A | Discharge status: Home / Self Care | Payer: Medicare Other | Attending: Cardiology | Admitting: Cardiology

## 2012-12-01 DIAGNOSIS — E785 Hyperlipidemia, unspecified: Secondary | ICD-10-CM | POA: Diagnosis present

## 2012-12-01 DIAGNOSIS — Z96659 Presence of unspecified artificial knee joint: Secondary | ICD-10-CM

## 2012-12-01 DIAGNOSIS — I252 Old myocardial infarction: Secondary | ICD-10-CM

## 2012-12-01 DIAGNOSIS — F329 Major depressive disorder, single episode, unspecified: Secondary | ICD-10-CM | POA: Diagnosis present

## 2012-12-01 DIAGNOSIS — N401 Enlarged prostate with lower urinary tract symptoms: Secondary | ICD-10-CM | POA: Diagnosis present

## 2012-12-01 DIAGNOSIS — G4733 Obstructive sleep apnea (adult) (pediatric): Secondary | ICD-10-CM | POA: Diagnosis present

## 2012-12-01 DIAGNOSIS — I209 Angina pectoris, unspecified: Principal | ICD-10-CM | POA: Diagnosis present

## 2012-12-01 DIAGNOSIS — Z87891 Personal history of nicotine dependence: Secondary | ICD-10-CM

## 2012-12-01 DIAGNOSIS — F3289 Other specified depressive episodes: Secondary | ICD-10-CM | POA: Diagnosis present

## 2012-12-01 DIAGNOSIS — Z9861 Coronary angioplasty status: Secondary | ICD-10-CM

## 2012-12-01 DIAGNOSIS — M519 Unspecified thoracic, thoracolumbar and lumbosacral intervertebral disc disorder: Secondary | ICD-10-CM | POA: Diagnosis present

## 2012-12-01 DIAGNOSIS — N138 Other obstructive and reflux uropathy: Secondary | ICD-10-CM | POA: Diagnosis present

## 2012-12-01 DIAGNOSIS — R079 Chest pain, unspecified: Secondary | ICD-10-CM

## 2012-12-01 DIAGNOSIS — K219 Gastro-esophageal reflux disease without esophagitis: Secondary | ICD-10-CM | POA: Diagnosis present

## 2012-12-01 DIAGNOSIS — I10 Essential (primary) hypertension: Secondary | ICD-10-CM | POA: Diagnosis present

## 2012-12-01 DIAGNOSIS — F411 Generalized anxiety disorder: Secondary | ICD-10-CM | POA: Diagnosis present

## 2012-12-01 DIAGNOSIS — I2510 Atherosclerotic heart disease of native coronary artery without angina pectoris: Secondary | ICD-10-CM | POA: Diagnosis present

## 2012-12-01 DIAGNOSIS — G894 Chronic pain syndrome: Secondary | ICD-10-CM | POA: Diagnosis present

## 2012-12-01 LAB — POCT I-STAT, CHEM 8
BUN: 16 mg/dL (ref 6–23)
Chloride: 98 mEq/L (ref 96–112)
HCT: 35 % — ABNORMAL LOW (ref 39.0–52.0)
Potassium: 4.4 mEq/L (ref 3.5–5.1)
Sodium: 132 mEq/L — ABNORMAL LOW (ref 135–145)

## 2012-12-01 LAB — BASIC METABOLIC PANEL
BUN: 16 mg/dL (ref 6–23)
CO2: 23 mEq/L (ref 19–32)
Calcium: 8.9 mg/dL (ref 8.4–10.5)
Creatinine, Ser: 0.96 mg/dL (ref 0.50–1.35)
GFR calc non Af Amer: 80 mL/min — ABNORMAL LOW (ref >90)
Glucose, Bld: 103 mg/dL — ABNORMAL HIGH (ref 70–99)
Sodium: 135 mEq/L (ref 135–145)

## 2012-12-01 LAB — CBC WITH DIFFERENTIAL/PLATELET
Eosinophils Absolute: 0.5 10*3/uL (ref 0.0–0.7)
Eosinophils Relative: 5 % (ref 0–5)
HCT: 35.5 % — ABNORMAL LOW (ref 39.0–52.0)
Lymphs Abs: 1.7 10*3/uL (ref 0.7–4.0)
MCH: 30.7 pg (ref 26.0–34.0)
MCV: 88.5 fL (ref 78.0–100.0)
Monocytes Absolute: 0.8 10*3/uL (ref 0.1–1.0)
Monocytes Relative: 8 % (ref 3–12)
Platelets: 180 10*3/uL (ref 150–400)
RBC: 4.01 MIL/uL — ABNORMAL LOW (ref 4.22–5.81)

## 2012-12-01 LAB — PRO B NATRIURETIC PEPTIDE: Pro B Natriuretic peptide (BNP): 1124 pg/mL — ABNORMAL HIGH (ref 0–125)

## 2012-12-01 LAB — TROPONIN I: Troponin I: <0.3 ng/mL (ref <0.30)

## 2012-12-01 MED ORDER — ACETAMINOPHEN 325 MG PO TABS
650.0000 mg | ORAL_TABLET | Freq: Once | ORAL | Status: AC
  Administered 2012-12-01: 650 mg via ORAL
  Filled 2012-12-01: qty 2

## 2012-12-01 MED ORDER — ESCITALOPRAM OXALATE 20 MG PO TABS
20.0000 mg | ORAL_TABLET | Freq: Every day | ORAL | Status: DC
  Filled 2012-12-01: qty 1

## 2012-12-01 MED ORDER — ENOXAPARIN SODIUM 100 MG/ML ~~LOC~~ SOLN
85.0000 mg | Freq: Two times a day (BID) | SUBCUTANEOUS | Status: DC
  Administered 2012-12-02: 85 mg via SUBCUTANEOUS
  Filled 2012-12-01 (×3): qty 1

## 2012-12-01 MED ORDER — SIMVASTATIN 40 MG PO TABS
40.0000 mg | ORAL_TABLET | Freq: Every day | ORAL | Status: DC
  Administered 2012-12-01: 40 mg via ORAL
  Filled 2012-12-01 (×2): qty 1

## 2012-12-01 MED ORDER — NITROGLYCERIN 0.4 MG SL SUBL
0.4000 mg | SUBLINGUAL_TABLET | SUBLINGUAL | Status: AC
  Administered 2012-12-01: 0.4 mg via SUBLINGUAL
  Filled 2012-12-01: qty 25

## 2012-12-01 MED ORDER — MORPHINE SULFATE ER 15 MG PO TBCR
30.0000 mg | EXTENDED_RELEASE_TABLET | Freq: Two times a day (BID) | ORAL | Status: DC
  Administered 2012-12-01: 30 mg via ORAL
  Filled 2012-12-01: qty 1

## 2012-12-01 MED ORDER — OLOPATADINE HCL 0.1 % OP SOLN
1.0000 [drp] | Freq: Two times a day (BID) | OPHTHALMIC | Status: DC
  Administered 2012-12-01: 1 [drp] via OPHTHALMIC
  Filled 2012-12-01: qty 5

## 2012-12-01 MED ORDER — MORPHINE SULFATE 4 MG/ML IJ SOLN
4.0000 mg | Freq: Once | INTRAMUSCULAR | Status: AC
  Administered 2012-12-01: 4 mg via INTRAVENOUS
  Filled 2012-12-01: qty 1

## 2012-12-01 MED ORDER — METOCLOPRAMIDE HCL 10 MG PO TABS
10.0000 mg | ORAL_TABLET | Freq: Two times a day (BID) | ORAL | Status: DC
  Administered 2012-12-01: 10 mg via ORAL
  Filled 2012-12-01 (×3): qty 1

## 2012-12-01 MED ORDER — MORPHINE SULFATE ER 15 MG PO TBCR: 30.0000 mg | EXTENDED_RELEASE_TABLET | Freq: Three times a day (TID) | ORAL | Status: DC

## 2012-12-01 MED ORDER — LISINOPRIL 20 MG PO TABS
20.0000 mg | ORAL_TABLET | Freq: Once | ORAL | Status: AC
  Administered 2012-12-01: 20 mg via ORAL
  Filled 2012-12-01: qty 1

## 2012-12-01 MED ORDER — DIGOXIN 125 MCG PO TABS
125.0000 ug | ORAL_TABLET | Freq: Every day | ORAL | Status: DC
  Filled 2012-12-01: qty 1

## 2012-12-01 MED ORDER — LISINOPRIL 20 MG PO TABS
20.0000 mg | ORAL_TABLET | Freq: Every day | ORAL | Status: DC
  Filled 2012-12-01: qty 1

## 2012-12-01 MED ORDER — GABAPENTIN 400 MG PO CAPS
400.0000 mg | ORAL_CAPSULE | Freq: Three times a day (TID) | ORAL | Status: DC
  Administered 2012-12-01: 400 mg via ORAL
  Filled 2012-12-01 (×4): qty 1

## 2012-12-01 NOTE — ED Notes (Signed)
Awaiting Cardiologist.  Denies CP.  Conversing with multiple friends and family.  Remains in AFib on monitor

## 2012-12-01 NOTE — ED Notes (Signed)
DeniesCP at this time.  Family at bedside.  Awaiting lab results.

## 2012-12-01 NOTE — ED Provider Notes (Signed)
CSN: [id number]     Arrival date & time 12/01/12  1908 History   First MD Initiated Contact with Patient 12/01/12 1915     Chief Complaint  Patient presents with  . Chest Pain   (Consider location/radiation/quality/duration/timing/severity/associated sxs/prior Treatment) Patient is a 72 y.o. male presenting with chest pain. The history is provided by the patient.  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 Hypertension   . Hyperlipidemia   . Preoperative cardiovascular examination   . Spinal stenosis, lumbar   . CAS (cerebral atherosclerosis)   . Erectile dysfunction   . Preventative health care   . Benign prostatic hypertrophy with urinary obstruction   . Family history of early CAD     male 1st degree relative <50  . Peptic ulcer disease   . Low back pain   . Superficial spreading melanoma   . Sleep apnea, obstructive     CPAP-does not use  . HA (headache)     sinus headaches  . Arthritis     knees  . Myocardial infarction 1997    Hx MI 1997 and 1998  . Anxiety   . Pneumonia 2005  . Anemia   . Depression     takes Zoloft  . Neuropathy, autonomic, idiopathic peripheral, other   . Blood transfusion 1 yr. ago    jerking,fever-after blood transfusion  . Blood transfusion     given wrong blood  .  Coronary artery disease 1998    cardiac stent/ Clearance Dr Lovell Sheehan and Jacinto Halim with note on chart  . Dysrhythmia     hx of atrial fib per ov note of 12/12   . Bradycardia     hx of bradycardia in past per ov note of 12/12  . GERD (gastroesophageal reflux disease)    Past Surgical History  Procedure Laterality Date  . Esophagogastroduodenoscopy  2004  . Colonoscopy  2004  . Lumbar laminectomy    . Lumbar fusion    . Knee arthroscopy      left  . Shoulder arthroscopy      right  . Knee arthroscopy      right  . Decompression of the median nerve      left wrist and hand  . Revision of tkr  2011    on left  . Coronary stent placement    . Back surgery  2005-last    lumbar x2  . Excisional total knee arthroplasty  12/25/2010    Procedure: EXCISIONAL TOTAL KNEE ARTHROPLASTY;  Surgeon: Shelda Pal;  Location: [facility];  Service: Orthopedics;  Laterality: Right;  repeat irrigation   and debridement, removal and reinsertation of spacer block  . Joint replacement  2011    left knee x 2 ; 08/2010-right knee-infected  . I&d extremity  03/25/2011    Procedure:  IRRIGATION AND DEBRIDEMENT EXTREMITY;  Surgeon: Shelda Pal, MD;  Location: [facility];  Service: Orthopedics;  Laterality: Right;  . Coronary angioplasty  1998    stents 1998   . Total knee revision  05/13/2011    Procedure: TOTAL KNEE REVISION;  Surgeon: Shelda Pal, MD;  Location: [facility];  Service: Orthopedics;  Laterality: Right;  Reimplantation   Family History  Problem Relation Age of Onset  . Heart disease Father   . Heart disease Mother   . Diabetes Sister   . Heart disease Brother    History  Substance Use Topics  . Smoking status: Former Smoker -- 1.00 packs/day for 15 years    Types: Cigars    Quit date: 12/23/2009  . Smokeless tobacco: Never Used     Comment: smoked a pipe for 6-7 y ears  . Alcohol Use: No    Review of Systems  Constitutional: Negative for fever, activity change, appetite change and fatigue.   HENT: Negative for congestion, facial swelling, rhinorrhea and trouble swallowing.   Eyes: Negative for photophobia and pain.  Respiratory: Negative for cough, chest tightness and shortness of breath.   Cardiovascular: Positive for chest pain. Negative for leg swelling.  Gastrointestinal: Negative for nausea, vomiting, abdominal pain, diarrhea and constipation.  Endocrine: Negative for polydipsia and polyuria.  Genitourinary: Negative for dysuria, urgency, decreased urine volume and difficulty urinating.  Musculoskeletal: Negative for back pain and gait problem.  Skin: Negative for color change, rash and wound.  Allergic/Immunologic: Negative for immunocompromised state.  Neurological: Positive for dizziness. Negative for facial asymmetry, speech difficulty, weakness, numbness and headaches.  Psychiatric/Behavioral: Negative for confusion, decreased concentration and agitation.    Allergies  Acetaminophen  Home Medications   Current Outpatient Rx  Name  Route  Sig  Dispense  Refill  . Azelastine-Fluticasone (DYMISTA) 137-50 MCG/ACT SUSP   Nasal   Place 2 sprays into the nose 2 (two) times daily.   23 g   6   . BEPREVE 1.5 % SOLN   Both Eyes   Place 1 drop into both eyes daily as needed (for eyes).          . calcium carbonate (TUMS EX) 750 MG chewable tablet   Oral   Chew 2 tablets by mouth 4 (four) times daily.         Marland Kitchen DIGOX 125 MCG tablet   Oral   Take 1 tablet by mouth daily.         Marland Kitchen escitalopram (LEXAPRO) 20 MG tablet   Oral   Take 20 mg by mouth daily.          Marland Kitchen gabapentin (NEURONTIN) 400 MG capsule   Oral   Take 1 capsule (400 mg total) by mouth 3 (three) times daily.   90 capsule   5   . influenza vac split quadrivalent PF (FLUZONE QUADRIVALENT) 0.5 ML injection   Intramuscular   Inject 0.5 mLs into the muscle once.         Marland Kitchen lisinopril (PRINIVIL,ZESTRIL) 20 MG tablet   Oral   Take 20 mg by mouth daily.         . metoCLOPramide  (REGLAN) 10 MG tablet   Oral   Take 10 mg by mouth 2 (two) times daily.         Marland Kitchen morphine (MS CONTIN) 30 MG 12 hr tablet   Oral   Take 1 tablet (30 mg total) by mouth 3 (three) times daily.   90  tablet   0     Do not fill prior to 09/24/2012   . NITROSTAT 0.4 MG SL tablet   Sublingual   Place 0.4 mg under the tongue every 5 (five) minutes as needed for chest pain. q 5 minutes x 3         . omeprazole (PRILOSEC) 20 MG capsule   Oral   Take 1 capsule (20 mg total) by mouth daily.   30 capsule   3   . PRADAXA 150 MG CAPS   Oral   Take 150 mg by mouth 2 (two) times daily.          . simvastatin (ZOCOR) 40 MG tablet   Oral   Take 40 mg by mouth at bedtime.         . sodium-potassium bicarbonate (ALKA-SELTZER GOLD) TBEF dissolvable tablet   Oral   Take 2 tablets by mouth daily as needed (for indigestion).           BP 156/100  Pulse 60  Temp(Src) 98.1 F (36.7 C) (Oral)  Resp 18  Ht 5\' 8"  (1.727 m)  Wt 193 lb 12.6 oz (87.9 kg)  BMI 29.47 kg/m2  SpO2 99% Physical Exam  Constitutional: He is oriented to person, place, and time. He appears well-developed and well-nourished. No distress.  HENT:  Head: Normocephalic and atraumatic.  Mouth/Throat: No oropharyngeal exudate.  Eyes: Pupils are equal, round, and reactive to light.  Neck: Normal range of motion. Neck supple.  Cardiovascular: Normal rate, regular rhythm and normal heart sounds.  Exam reveals no gallop and no friction rub.   No murmur heard. Pulmonary/Chest: Effort normal and breath sounds normal. No respiratory distress. He has no wheezes. He has no rales.  Abdominal: Soft. Bowel sounds are normal. He exhibits no distension and no mass. There is no tenderness. There is no rebound and no guarding.  Musculoskeletal: Normal range of motion. He exhibits no edema and no tenderness.  Neurological: He is alert and oriented to person, place, and time.  Skin: Skin is warm and dry.  Psychiatric: He has a  normal mood and affect.    ED Course  Procedures (including critical care time) Labs Review Labs Reviewed  CBC WITH DIFFERENTIAL - Abnormal; Notable for the following:    RBC 4.01 (*)    Hemoglobin 12.3 (*)    HCT 35.5 (*)    All other components within normal limits  BASIC METABOLIC PANEL - Abnormal; Notable for the following:    Glucose, Bld 103 (*)    GFR calc non Af Amer 80 (*)    All other components within normal limits  PRO B NATRIURETIC PEPTIDE - Abnormal; Notable for the following:    Pro B Natriuretic peptide (BNP) 1124.0 (*)    All other components within normal limits  POCT I-STAT, CHEM 8 - Abnormal; Notable for the following:    Sodium 132 (*)    Glucose, Bld 106 (*)    Hemoglobin 11.9 (*)    HCT 35.0 (*)    All other components within normal limits  TROPONIN I  TROPONIN I  TROPONIN I  TROPONIN I  TROPONIN I   Imaging Review Dg Chest Portable 1 View  12/01/2012   CLINICAL DATA:  Intermittent chest pain.  EXAM: PORTABLE CHEST - 1 VIEW  COMPARISON:  PA and lateral chest 06/20/2012 and 05/18/2012.  FINDINGS: The heart size and mediastinal contours are within normal limits. Both lungs are clear. The visualized skeletal structures are  unremarkable. Postoperative change right shoulder is noted.  IMPRESSION: No active disease.   Electronically Signed   By: Drusilla Kanner M.D.   On: 12/01/2012 20:02    EKG Interpretation     Ventricular Rate:    PR Interval:    QRS Duration:   QT Interval:    QTC Calculation:   R Axis:     Text Interpretation:             Date: 12/01/2012  Rate: 80  Rhythm: sinus arrhythmia  QRS Axis: normal  Intervals: normal  ST/T Wave abnormalities: nonspecific T wave changes  Conduction Disutrbances:frequent PAC  Narrative Interpretation:   Old EKG Reviewed: unchanged    MDM   1. Chest pain    Pt is a 72 y.o. 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 Given ASA and NTG x3 by EMS, now having return of chest tightness upon arrival which resolved after 1 SL NTG.  First trop negative. No EKG changes, BNP not elevated, CXR unremarkable.  Have spoken w/ Dr. Jacinto Halim who requests dose of lovenox and admission to his service.         Shanna Cisco, MD 12/02/12 [phone]

## 2012-12-01 NOTE — Progress Notes (Signed)
ANTICOAGULATION CONSULT NOTE - Initial Consult  Pharmacy Consult for lovenox Indication: chest pain/ACS  Allergies  Allergen Reactions  . Acetaminophen Other (See Comments)    LIVER INFLAMMATION IN HIGH DOSES    Patient Measurements:   Dosing Weight: 83.9 kg  Vital Signs: BP: 180/85 mmHg (10/14 2338) Pulse Rate: 130 (10/14 2230)  Labs:  Recent Labs  12/01/12 1900 12/01/12 2003 12/01/12 2210  HGB 12.3* 11.9*  --   HCT 35.5* 35.0*  --   PLT 180  --   --   CREATININE 0.96 1.20  --   TROPONINI  --   --  <0.30    The CrCl is unknown because both a height and weight (above a minimum accepted value) are required for this calculation.   Medical History: Past Medical History  Diagnosis Date  . Hypertension   . Hyperlipidemia   . Preoperative cardiovascular examination   . Spinal stenosis, lumbar   . CAS (cerebral atherosclerosis)   . Erectile dysfunction   . Preventative health care   . Benign prostatic hypertrophy with urinary obstruction   . Family history of early CAD     male 1st degree relative <50  . Peptic ulcer disease   . Low back pain   . Superficial spreading melanoma   . Sleep apnea, obstructive     CPAP-does not use  . HA (headache)     sinus headaches  . Arthritis     knees  . Myocardial infarction 1997    Hx MI 1997 and 1998  . Anxiety   . Pneumonia 2005  . Anemia   . Depression     takes Zoloft  . Neuropathy, autonomic, idiopathic peripheral, other   . Blood transfusion 1 yr. ago    jerking,fever-after blood transfusion  . Blood transfusion     given wrong blood  . Coronary artery disease 1998    cardiac stent/ Clearance Dr Lovell Sheehan and Jacinto Halim with note on chart  . Dysrhythmia     hx of atrial fib per ov note of 12/12   . Bradycardia     hx of bradycardia in past per ov note of 12/12  . GERD (gastroesophageal reflux disease)     Medications:   (Not in a hospital admission)  Assessment: 72 yo man to start lovenox for CP.  He was  on pradaxa at home and last dose was 10/13.   Goal of Therapy:  Anti-Xa level 0.6-1.2 units/ml 4hrs after LMWH dose given Monitor platelets by anticoagulation protocol: Yes   Plan:  Lovenox 85 mg sq q12 hours. CBC q 3 days while on lovenox.  Talbert Cage Poteet 12/01/2012,11:49 PM

## 2012-12-01 NOTE — ED Notes (Addendum)
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 Pt also c/o nausea. EMS admin ASA 324mg  and Nitro x3. Pt denies CP at this time. A&Ox4. Pt currently afib with hx of afib.

## 2012-12-02 ENCOUNTER — Encounter (HOSPITAL_COMMUNITY): Payer: Self-pay | Admitting: *Deleted

## 2012-12-02 LAB — TROPONIN I
Troponin I: <0.3 ng/mL (ref <0.30)
Troponin I: <0.3 ng/mL (ref <0.30)

## 2012-12-02 MED ORDER — HYDROMORPHONE HCL PF 1 MG/ML IJ SOLN
1.0000 mg | INTRAMUSCULAR | Status: DC | PRN
  Administered 2012-12-02: 1 mg via INTRAVENOUS
  Filled 2012-12-02 (×2): qty 1

## 2012-12-02 MED ORDER — NITROGLYCERIN 2 % TD OINT
0.5000 [in_us] | TOPICAL_OINTMENT | Freq: Four times a day (QID) | TRANSDERMAL | Status: DC
  Administered 2012-12-02 (×2): 0.5 [in_us] via TOPICAL
  Filled 2012-12-02: qty 30

## 2012-12-02 MED ORDER — ONDANSETRON HCL 4 MG/2ML IJ SOLN
4.0000 mg | Freq: Three times a day (TID) | INTRAMUSCULAR | Status: DC | PRN
  Administered 2012-12-02: 4 mg via INTRAVENOUS
  Filled 2012-12-02: qty 2

## 2012-12-02 MED ORDER — HYDROMORPHONE HCL PF 1 MG/ML IJ SOLN
0.5000 mg | Freq: Once | INTRAMUSCULAR | Status: AC
  Administered 2012-12-02: 0.5 mg via INTRAVENOUS

## 2012-12-02 NOTE — H&P (Signed)
[name] is an 72 y.o. male.   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 He has not had any recurrence of chest pain and requested a discharge him. He had mild dizziness while at home, but otherwise no syncope. He denies any shortness of breath, PND or orthopnea. Denies any leg edema recently. No hemoptysis.  He had a fall on 11/24/2012, tripped and fell, was evaluated in the emergency room here. Presently this morning except for sinusitis-like headache that he is chronically, has no other complaints.  Past Medical History  Diagnosis Date  . Hypertension   . Hyperlipidemia   . Preoperative cardiovascular examination   . Spinal stenosis, lumbar   . CAS (cerebral atherosclerosis)   . Erectile dysfunction   . Preventative health care   . Benign prostatic hypertrophy with urinary obstruction   . Family history of early CAD     male 1st degree relative <50  . Peptic ulcer disease   . Low back pain   . Superficial spreading melanoma   . Sleep apnea, obstructive     CPAP-does not use  . HA (headache)     sinus headaches  . Arthritis     knees  . Myocardial infarction 1997    Hx MI 1997 and 1998  . Anxiety   . Pneumonia 2005  . Anemia   . Depression     takes Zoloft  . Neuropathy, autonomic, idiopathic peripheral, other   . Blood transfusion 1 yr. ago    jerking,fever-after blood  transfusion  . Blood transfusion     given wrong blood  . Coronary artery disease 1998    cardiac stent/ Clearance Dr Lovell Sheehan and Jacinto Halim with note on chart  . Dysrhythmia     hx of atrial fib per ov note of 12/12   . Bradycardia     hx of bradycardia in past per ov note of 12/12  . GERD (gastroesophageal reflux disease)     Past Surgical History  Procedure Laterality Date  . Esophagogastroduodenoscopy  2004  . Colonoscopy  2004  . Lumbar laminectomy    . Lumbar fusion    . Knee arthroscopy      left  . Shoulder arthroscopy      right  . Knee arthroscopy      right  . Decompression of the median nerve      left wrist and hand  . Revision of tkr  2011    on left  . Coronary stent placement    . Back surgery  2005-last    lumbar x2  . Excisional total knee arthroplasty  12/25/2010    Procedure: EXCISIONAL TOTAL KNEE ARTHROPLASTY;  Surgeon: Shelda Pal;  Location: [facility];  Service: Orthopedics;  Laterality: Right;  repeat irrigation   and debridement, removal and reinsertation of spacer block  . Joint replacement  2011    left knee x 2 ; 08/2010-right knee-infected  .  I&d extremity  03/25/2011    Procedure: IRRIGATION AND DEBRIDEMENT EXTREMITY;  Surgeon: Shelda Pal, MD;  Location: [facility];  Service: Orthopedics;  Laterality: Right;  . Coronary angioplasty  1998    stents 1998   . Total knee revision  05/13/2011    Procedure: TOTAL KNEE REVISION;  Surgeon: Shelda Pal, MD;  Location: [facility];  Service: Orthopedics;  Laterality: Right;  Reimplantation    Family History  Problem Relation Age of Onset  . Heart disease Father   . Heart disease Mother   . Diabetes Sister   . Heart disease Brother    Social History:  reports that he quit smoking about 2 years ago. His smoking use included Cigars. He has never used smokeless tobacco. He reports that he does not drink alcohol or use illicit drugs.  Allergies:  Allergies  Allergen Reactions  . Acetaminophen Other (See  Comments)    LIVER INFLAMMATION IN HIGH DOSES    Medications Prior to Admission  Medication Sig Dispense Refill  . Azelastine-Fluticasone (DYMISTA) 137-50 MCG/ACT SUSP Place 2 sprays into the nose 2 (two) times daily.  23 g  6  . BEPREVE 1.5 % SOLN Place 1 drop into both eyes daily as needed (for eyes).       . calcium carbonate (TUMS EX) 750 MG chewable tablet Chew 2 tablets by mouth 4 (four) times daily.      Marland Kitchen DIGOX 125 MCG tablet Take 1 tablet by mouth daily.      Marland Kitchen escitalopram (LEXAPRO) 20 MG tablet Take 20 mg by mouth daily.       Marland Kitchen gabapentin (NEURONTIN) 400 MG capsule Take 1 capsule (400 mg total) by mouth 3 (three) times daily.  90 capsule  5  . influenza vac split quadrivalent PF (FLUZONE QUADRIVALENT) 0.5 ML injection Inject 0.5 mLs into the muscle once.      Marland Kitchen lisinopril (PRINIVIL,ZESTRIL) 20 MG tablet Take 20 mg by mouth daily.      . metoCLOPramide (REGLAN) 10 MG tablet Take 10 mg by mouth 2 (two) times daily.      Marland Kitchen morphine (MS CONTIN) 30 MG 12 hr tablet Take 1 tablet (30 mg total) by mouth 3 (three) times daily.  90 tablet  0  . NITROSTAT 0.4 MG SL tablet Place 0.4 mg under the tongue every 5 (five) minutes as needed for chest pain. q 5 minutes x 3      . omeprazole (PRILOSEC) 20 MG capsule Take 1 capsule (20 mg total) by mouth daily.  30 capsule  3  . PRADAXA 150 MG CAPS Take 150 mg by mouth 2 (two) times daily.       . simvastatin (ZOCOR) 40 MG tablet Take 40 mg by mouth at bedtime.      . sodium-potassium bicarbonate (ALKA-SELTZER GOLD) TBEF dissolvable tablet Take 2 tablets by mouth daily as needed (for indigestion).        Review of Systems - General ROS: positive for - generalized weakness, severe back pain,, severe leg cramps both lower extremities,  negative for - chills, fever or he hemoptysis, cough, nausea, vomiting, syncope. No diabetes.   Blood pressure 131/59, pulse 52, temperature 98.1 F (36.7 C), temperature source Oral, resp. rate 18, height 5\' 8"  (1.727  m), weight 87.9 kg (193 lb 12.6 oz), SpO2 99.00%.  General appearance: alert, cooperative, appears stated age and no distress, has ecchymosis throughout his face from the previous fall. Eyes: normal grossly  Neck: no adenopathy,  no carotid bruit, no JVD, supple, symmetrical, trachea midline and thyroid not enlarged, symmetric, no tenderness/mass/nodules  Neck: JVP - normal, carotids 2+= without bruits  Resp: rales LLL  Chest wall: no tenderness  Cardio: S1, S2 normal, no murmur, click, rub or gallop, frequent ectopics heard. GI: soft, non-tender; bowel sounds normal; no masses, no organomegaly  Extremities: bilateral 1-2+ globally pitting edema without any tenderness.  Pulses: 2+ and symmetric  Skin: Skin color, texture, turgor normal. No rashes or lesions  Neurologic: Grossly normal  Results for orders placed during the hospital encounter of 12/01/12 (from the past 48 hour(s))  CBC WITH DIFFERENTIAL     Status: Abnormal   Collection Time    12/01/12  7:00 PM      Result Value Range   WBC 9.6  4.0 - 10.5 K/uL   RBC 4.01 (*) 4.22 - 5.81 MIL/uL   Hemoglobin 12.3 (*) 13.0 - 17.0 g/dL   HCT 30.8 (*) 65.7 - 84.6 %   MCV 88.5  78.0 - 100.0 fL   MCH 30.7  26.0 - 34.0 pg   MCHC 34.6  30.0 - 36.0 g/dL   RDW 96.2  95.2 - 84.1 %   Platelets 180  150 - 400 K/uL   Neutrophils Relative % 69  43 - 77 %   Neutro Abs 6.6  1.7 - 7.7 K/uL   Lymphocytes Relative 18  12 - 46 %   Lymphs Abs 1.7  0.7 - 4.0 K/uL   Monocytes Relative 8  3 - 12 %   Monocytes Absolute 0.8  0.1 - 1.0 K/uL   Eosinophils Relative 5  0 - 5 %   Eosinophils Absolute 0.5  0.0 - 0.7 K/uL   Basophils Relative 0  0 - 1 %   Basophils Absolute 0.0  0.0 - 0.1 K/uL  BASIC METABOLIC PANEL     Status: Abnormal   Collection Time    12/01/12  7:00 PM      Result Value Range   Sodium 135  135 - 145 mEq/L   Potassium 4.6  3.5 - 5.1 mEq/L   Chloride 98  96 - 112 mEq/L   CO2 23  19 - 32 mEq/L   Glucose, Bld 103 (*) 70 - 99 mg/dL    BUN 16  6 - 23 mg/dL   Creatinine, Ser 3.24  0.50 - 1.35 mg/dL   Calcium 8.9  8.4 - 40.1 mg/dL   GFR calc non Af Amer 80 (*) >90 mL/min   GFR calc Af Amer >90  >90 mL/min   Comment: (NOTE)     The eGFR has been calculated using the CKD EPI equation.     This calculation has not been validated in all clinical situations.     eGFR's persistently <90 mL/min signify possible Chronic Kidney     Disease.  PRO B NATRIURETIC PEPTIDE     Status: Abnormal   Collection Time    12/01/12  7:00 PM      Result Value Range   Pro B Natriuretic peptide (BNP) 1124.0 (*) 0 - 125 pg/mL  POCT I-STAT, CHEM 8     Status: Abnormal   Collection Time    12/01/12  8:03 PM      Result Value Range   Sodium 132 (*) 135 - 145 mEq/L   Potassium 4.4  3.5 - 5.1 mEq/L   Chloride 98  96 - 112 mEq/L   BUN 16  6 - 23 mg/dL  Creatinine, Ser 1.20  0.50 - 1.35 mg/dL   Glucose, Bld 657 (*) 70 - 99 mg/dL   Calcium, Ion 8.46  9.62 - 1.30 mmol/L   TCO2 25  0 - 100 mmol/L   Hemoglobin 11.9 (*) 13.0 - 17.0 g/dL   HCT 95.2 (*) 84.1 - 32.4 %  TROPONIN I     Status: None   Collection Time    12/01/12 10:10 PM      Result Value Range   Troponin I <0.30  <0.30 ng/mL   Comment:            Due to the release kinetics of cTnI,     a negative result within the first hours     of the onset of symptoms does not rule out     myocardial infarction with certainty.     If myocardial infarction is still suspected,     repeat the test at appropriate intervals.  TROPONIN I     Status: None   Collection Time    12/02/12  4:34 AM      Result Value Range   Troponin I <0.30  <0.30 ng/mL   Comment:            Due to the release kinetics of cTnI,     a negative result within the first hours     of the onset of symptoms does not rule out     myocardial infarction with certainty.     If myocardial infarction is still suspected,     repeat the test at appropriate intervals.   Dg Chest Portable 1 View  12/01/2012   CLINICAL DATA:   Intermittent chest pain.  EXAM: PORTABLE CHEST - 1 VIEW  COMPARISON:  PA and lateral chest 06/20/2012 and 05/18/2012.  FINDINGS: The heart size and mediastinal contours are within normal limits. Both lungs are clear. The visualized skeletal structures are unremarkable. Postoperative change right shoulder is noted.  IMPRESSION: No active disease.   Electronically Signed   By: Drusilla Kanner M.D.   On: 12/01/2012 20:02    Labs:   Lab Results  Component Value Date   WBC 9.6 12/01/2012   HGB 11.9* 12/01/2012   HCT 35.0* 12/01/2012   MCV 88.5 12/01/2012   PLT 180 12/01/2012    Recent Labs Lab 12/01/12 1900 12/01/12 2003  NA 135 132*  K 4.6 4.4  CL 98 98  CO2 23  --   BUN 16 16  CREATININE 0.96 1.20  CALCIUM 8.9  --   GLUCOSE 103* 106*   Lab Results  Component Value Date   CKTOTAL 114 04/14/2012   CKMB 6.1* 04/14/2012   TROPONINI <0.30 12/02/2012    Cardiac Panel (last 3 results)  Recent Labs  12/01/12 2210 12/02/12 0434  TROPONINI <0.30 <0.30     EKG: normal EKG, normal sinus rhythm, unchanged from previous tracings, frequent PACs noted. No evidence of ischemia.   Assessment/Plan 1. Chest pain probably angina pectoris, patient with chronic chest pain syndrome, without any recurrence after one sublingual nitroglycerin use, EKG not showing any acute ischemic changes. Cardiac troponins negative. 2. CAD/ASHD MI and h/o Stenting in 1998 right coronary artery (40% ISR but patent) and also OM branch of circumflex coronary artery. Has a small D1 with high grade stenosis.  Out patient Nuclear stress test 5/10: Small Apical scar without ischemia. EF 59%. Low risk.  3. Hypertension  4. Severe central and Obstructive sleep apnea. Follows Jetty Duhamel, MD 5. Chronic  pain syndrome on chronic morphine. Lumbosacral disc disease. 6. Anxiety and depression.  Recommendation: Patient will be discharged home this morning. He has an appointment to see me in 3 weeks, he'll continue to  follow up with me. If he has recurrence of chest pain, he is advised to contact me.   Pamella Pert, MD 12/02/2012, 9:05 AM [facility] Pager: [phone] Office: [phone] If no answer: Cell:  [phone]

## 2012-12-02 NOTE — Discharge Summary (Signed)
Please note patient was admitted overnight observation only, please see my edge of the eye.    Medication List         Azelastine-Fluticasone 137-50 MCG/ACT Susp  Commonly known as:  DYMISTA  Place 2 sprays into the nose 2 (two) times daily.     BEPREVE 1.5 % Soln  Generic drug:  Bepotastine Besilate  Place 1 drop into both eyes daily as needed (for eyes).     calcium carbonate 750 MG chewable tablet  Commonly known as:  TUMS EX  Chew 2 tablets by mouth 4 (four) times daily.     DIGOX 0.125 MG tablet  Generic drug:  digoxin  Take 1 tablet by mouth daily.     escitalopram 20 MG tablet  Commonly known as:  LEXAPRO  Take 20 mg by mouth daily.     FLUZONE QUADRIVALENT 0.5 ML injection  Generic drug:  influenza vac split quadrivalent PF  Inject 0.5 mLs into the muscle once.     gabapentin 400 MG capsule  Commonly known as:  NEURONTIN  Take 1 capsule (400 mg total) by mouth 3 (three) times daily.     lisinopril 20 MG tablet  Commonly known as:  PRINIVIL,ZESTRIL  Take 20 mg by mouth daily.     metoCLOPramide 10 MG tablet  Commonly known as:  REGLAN  Take 10 mg by mouth 2 (two) times daily.     morphine 30 MG 12 hr tablet  Commonly known as:  MS CONTIN  Take 1 tablet (30 mg total) by mouth 3 (three) times daily.     NITROSTAT 0.4 MG SL tablet  Generic drug:  nitroGLYCERIN  Place 0.4 mg under the tongue every 5 (five) minutes as needed for chest pain. q 5 minutes x 3     omeprazole 20 MG capsule  Commonly known as:  PRILOSEC  Take 1 capsule (20 mg total) by mouth daily.     PRADAXA 150 MG Caps capsule  Generic drug:  dabigatran  Take 150 mg by mouth 2 (two) times daily.     simvastatin 40 MG tablet  Commonly known as:  ZOCOR  Take 40 mg by mouth at bedtime.     sodium-potassium bicarbonate Tbef dissolvable tablet  Commonly known as:  ALKA-SELTZER GOLD  Take 2 tablets by mouth daily as needed (for indigestion).

## 2012-12-08 ENCOUNTER — Encounter: Payer: Medicare Other | Attending: Physical Medicine & Rehabilitation

## 2012-12-08 ENCOUNTER — Ambulatory Visit (HOSPITAL_BASED_OUTPATIENT_CLINIC_OR_DEPARTMENT_OTHER): Payer: Medicare Other | Admitting: Physical Medicine & Rehabilitation

## 2012-12-08 ENCOUNTER — Encounter: Payer: Self-pay | Admitting: Physical Medicine & Rehabilitation

## 2012-12-08 VITALS — BP 157/87 | HR 90 | Resp 14 | Ht 68.0 in | Wt 188.6 lb

## 2012-12-08 DIAGNOSIS — M47817 Spondylosis without myelopathy or radiculopathy, lumbosacral region: Secondary | ICD-10-CM | POA: Insufficient documentation

## 2012-12-08 DIAGNOSIS — Z79899 Other long term (current) drug therapy: Secondary | ICD-10-CM

## 2012-12-08 DIAGNOSIS — M549 Dorsalgia, unspecified: Secondary | ICD-10-CM

## 2012-12-08 DIAGNOSIS — M545 Low back pain, unspecified: Secondary | ICD-10-CM | POA: Insufficient documentation

## 2012-12-08 DIAGNOSIS — M48061 Spinal stenosis, lumbar region without neurogenic claudication: Secondary | ICD-10-CM

## 2012-12-08 DIAGNOSIS — G8929 Other chronic pain: Secondary | ICD-10-CM

## 2012-12-08 DIAGNOSIS — G609 Hereditary and idiopathic neuropathy, unspecified: Secondary | ICD-10-CM | POA: Insufficient documentation

## 2012-12-08 DIAGNOSIS — M961 Postlaminectomy syndrome, not elsewhere classified: Secondary | ICD-10-CM | POA: Insufficient documentation

## 2012-12-08 DIAGNOSIS — Z5181 Encounter for therapeutic drug level monitoring: Secondary | ICD-10-CM

## 2012-12-08 DIAGNOSIS — F1920 Other psychoactive substance dependence, uncomplicated: Secondary | ICD-10-CM | POA: Insufficient documentation

## 2012-12-08 DIAGNOSIS — M79609 Pain in unspecified limb: Secondary | ICD-10-CM | POA: Insufficient documentation

## 2012-12-08 DIAGNOSIS — Z96659 Presence of unspecified artificial knee joint: Secondary | ICD-10-CM | POA: Insufficient documentation

## 2012-12-08 DIAGNOSIS — M216X9 Other acquired deformities of unspecified foot: Secondary | ICD-10-CM

## 2012-12-08 MED ORDER — MORPHINE SULFATE ER 30 MG PO TBCR: 30.0000 mg | EXTENDED_RELEASE_TABLET | Freq: Three times a day (TID) | ORAL | Status: DC

## 2012-12-08 NOTE — Patient Instructions (Signed)
Call if repeat injection sneeded

## 2012-12-08 NOTE — Progress Notes (Signed)
Subjective:    Patient ID: [name], male    DOB: [birth year], 72 y.o.   MRN: [id number]  HPI Lateral sacroiliac injection 10/22/2012 relieved 50% or more of his usual pain In combination with pain medications seems to be helping pain to a great degree. Almost no pain while the pain medication is in full effect Originally here for medial branch blocks but has no driver and reports improvements listed above. Pain Inventory Average Pain 5 Pain Right Now 4 My pain is dull  In the last 24 hours, has pain interfered with the following? General activity 2 Relation with others 2 Enjoyment of life 2 What TIME of day is your pain at its worst? varies Sleep (in general) Fair  Pain is worse with: standing and some activites Pain improves with: rest, medication and injections Relief from Meds: 7  Mobility walk with assistance use a cane use a walker how many minutes can you walk? 10 ability to climb steps?  yes do you drive?  yes  Function not employed: date last employed 06/29/2009 I need assistance with the following:  shopping Do you have any goals in this area?  yes  Neuro/Psych trouble walking  Prior Studies Any changes since last visit?  no  Physicians involved in your care Any changes since last visit?  no   Family History  Problem Relation Age of Onset  . Heart disease Father   . Heart disease Mother   . Diabetes Sister   . Heart disease Brother    History   Social History  . Marital Status: Married    Spouse Name: N/A    Number of Children: N/A  . Years of Education: N/A   Occupational History  . diesel repair     part time  . worked at [employer] History Main Topics  . Smoking status: Former Smoker -- 1.00 packs/day for 15 years    Types: Cigars    Quit date: 12/23/2009  . Smokeless tobacco: Never Used     Comment: smoked a pipe for 6-7 y ears  . Alcohol Use: No  . Drug Use: No  . Sexual Activity: None   Other Topics Concern  .  None   Social History Narrative  . None   Past Surgical History  Procedure Laterality Date  . Esophagogastroduodenoscopy  2004  . Colonoscopy  2004  . Lumbar laminectomy    . Lumbar fusion    . Knee arthroscopy      left  . Shoulder arthroscopy      right  . Knee arthroscopy      right  . Decompression of the median nerve      left wrist and hand  . Revision of tkr  2011    on left  . Coronary stent placement    . Back surgery  2005-last    lumbar x2  . Excisional total knee arthroplasty  12/25/2010    Procedure: EXCISIONAL TOTAL KNEE ARTHROPLASTY;  Surgeon: Shelda Pal;  Location: [facility];  Service: Orthopedics;  Laterality: Right;  repeat irrigation   and debridement, removal and reinsertation of spacer block  . Joint replacement  2011    left knee x 2 ; 08/2010-right knee-infected  . I&d extremity  03/25/2011    Procedure: IRRIGATION AND DEBRIDEMENT EXTREMITY;  Surgeon: Shelda Pal, MD;  Location: [facility];  Service: Orthopedics;  Laterality: Right;  . Coronary angioplasty  1998    stents 1998   .  Total knee revision  05/13/2011    Procedure: TOTAL KNEE REVISION;  Surgeon: Shelda Pal, MD;  Location: [facility];  Service: Orthopedics;  Laterality: Right;  Reimplantation   Past Medical History  Diagnosis Date  . Hypertension   . Hyperlipidemia   . Preoperative cardiovascular examination   . Spinal stenosis, lumbar   . CAS (cerebral atherosclerosis)   . Erectile dysfunction   . Preventative health care   . Benign prostatic hypertrophy with urinary obstruction   . Family history of early CAD     male 1st degree relative <50  . Peptic ulcer disease   . Low back pain   . Superficial spreading melanoma   . Sleep apnea, obstructive     CPAP-does not use  . HA (headache)     sinus headaches  . Arthritis     knees  . Myocardial infarction 1997    Hx MI 1997 and 1998  . Anxiety   . Pneumonia 2005  . Anemia   . Depression     takes Zoloft  . Neuropathy, autonomic,  idiopathic peripheral, other   . Blood transfusion 1 yr. ago    jerking,fever-after blood transfusion  . Blood transfusion     given wrong blood  . Coronary artery disease 1998    cardiac stent/ Clearance Dr Lovell Sheehan and Jacinto Halim with note on chart  . Dysrhythmia     hx of atrial fib per ov note of 12/12   . Bradycardia     hx of bradycardia in past per ov note of 12/12  . GERD (gastroesophageal reflux disease)    BP 157/87  Pulse 90  Resp 14  Ht 5\' 8"  (1.727 m)  Wt 188 lb 9.6 oz (85.548 kg)  BMI 28.68 kg/m2  SpO2 96%      Review of Systems  Musculoskeletal: Positive for back pain and gait problem.  Hematological: Bruises/bleeds easily.  All other systems reviewed and are negative.       Objective:   Physical Exam  Nursing note and vitals reviewed. Constitutional: He is oriented to person, place, and time. He appears well-developed and well-nourished.  HENT:  Head: Atraumatic.  Musculoskeletal:       Lumbar back: He exhibits decreased range of motion and deformity. He exhibits no tenderness.  -SLR  Neurological: He is alert and oriented to person, place, and time. A sensory deficit is present. Gait abnormal.  Bilateral foot drop 2-/5 Bilateral TA Amb with a walker steppage gait Does not have foot off orthosis connected  Psychiatric: He has a normal mood and affect.  Appear brighter          Assessment & Plan:  1.  Lumbar post lami with good improvements after both   MBB and SI injections.  RTC 1 mo , may need to repeat injections in the next 1-2 months

## 2012-12-11 ENCOUNTER — Telehealth: Payer: Self-pay

## 2012-12-11 MED ORDER — MORPHINE SULFATE ER 15 MG PO TBCR: 15.0000 mg | EXTENDED_RELEASE_TABLET | Freq: Three times a day (TID) | ORAL | Status: DC | PRN

## 2012-12-11 NOTE — Progress Notes (Signed)
Entry error

## 2012-12-11 NOTE — Telephone Encounter (Signed)
Left vm on home and mobile numbers to [facility] so that we can get him the medication rx

## 2012-12-11 NOTE — Telephone Encounter (Signed)
This patient has fairly severe coronary artery disease. I do not want him to go through withdrawal. We can ride a prescription for morphine sulfate controlled release 15 mg 3 times per day #90, no refill

## 2012-12-11 NOTE — Progress Notes (Signed)
Epic issue

## 2012-12-11 NOTE — Progress Notes (Signed)
Epic error

## 2012-12-11 NOTE — Telephone Encounter (Signed)
Patient called and says his wife has thrown his morphine script away.  Patient would like to know what to do.  Informed patient of office policy.  Please advise.

## 2012-12-14 ENCOUNTER — Telehealth: Payer: Self-pay | Admitting: *Deleted

## 2012-12-14 NOTE — Telephone Encounter (Signed)
I spoke with [name] daughter since we were unable to reach [name].  He was able to retrieve his med that his wife accidentally threw away. Just FYI.  We still have printed rx.

## 2012-12-14 NOTE — Telephone Encounter (Signed)
Getting information that home number we have listed for [name] is incorrect. No answer or name on mobile voicemail.  Left message with [name]'s identified  voicmail, his daughter, to please call us so that we may get this corrected.

## 2012-12-14 NOTE — Telephone Encounter (Signed)
rx printed 12/11/12 for MS CONTIN 15 mg destroyed.

## 2013-01-01 ENCOUNTER — Ambulatory Visit (INDEPENDENT_AMBULATORY_CARE_PROVIDER_SITE_OTHER): Payer: Medicare Other | Admitting: Family Medicine

## 2013-01-01 ENCOUNTER — Encounter: Payer: Self-pay | Admitting: Family Medicine

## 2013-01-01 VITALS — BP 180/90 | HR 66 | Temp 98.1°F | Wt 183.0 lb

## 2013-01-01 DIAGNOSIS — J069 Acute upper respiratory infection, unspecified: Secondary | ICD-10-CM

## 2013-01-01 DIAGNOSIS — Z23 Encounter for immunization: Secondary | ICD-10-CM

## 2013-01-01 NOTE — Patient Instructions (Signed)

## 2013-01-01 NOTE — Progress Notes (Signed)
Pre visit review using our clinic review tool, if applicable. No additional management support is needed unless otherwise documented below in the visit note. 

## 2013-01-01 NOTE — Progress Notes (Signed)
Chief Complaint  Patient presents with  . Cough    congestion    HPI:  [name] is a 72 yo pt of Dr. York Ram with MMP here for an acute visit for upper respiratory symptoms: -started: yesterday -symptoms:nasal congestion, PND, cough -denies:fever, SOB, NVD, tooth pain -has tried: has not tried anything -sick contacts/travel/risks: denies flu exposure, tick exposure or or Ebola risks -Hx of: remote hx of pneumonia, no known chronic lung disease   ROS: See pertinent positives and negatives per HPI.  Past Medical History  Diagnosis Date  . Hypertension   . Hyperlipidemia   . Preoperative cardiovascular examination   . Spinal stenosis, lumbar   . CAS (cerebral atherosclerosis)   . Erectile dysfunction   . Preventative health care   . Benign prostatic hypertrophy with urinary obstruction   . Family history of early CAD     male 1st degree relative <50  . Peptic ulcer disease   . Low back pain   . Superficial spreading melanoma   . Sleep apnea, obstructive     CPAP-does not use  . HA (headache)     sinus headaches  . Arthritis     knees  . Myocardial infarction 1997    Hx MI 1997 and 1998  . Anxiety   . Pneumonia 2005  . Anemia   . Depression     takes Zoloft  . Neuropathy, autonomic, idiopathic peripheral, other   . Blood transfusion 1 yr. ago    jerking,fever-after blood transfusion  . Blood transfusion     given wrong blood  . Coronary artery disease 1998    cardiac stent/ Clearance Dr Lovell Sheehan and Jacinto Halim with note on chart  . Dysrhythmia     hx of atrial fib per ov note of 12/12   . Bradycardia     hx of bradycardia in past per ov note of 12/12  . GERD (gastroesophageal reflux disease)     Past Surgical History  Procedure Laterality Date  . Esophagogastroduodenoscopy  2004  . Colonoscopy  2004  . Lumbar laminectomy    . Lumbar fusion    . Knee arthroscopy      left  . Shoulder arthroscopy      right  . Knee arthroscopy      right  .  Decompression of the median nerve      left wrist and hand  . Revision of tkr  2011    on left  . Coronary stent placement    . Back surgery  2005-last    lumbar x2  . Excisional total knee arthroplasty  12/25/2010    Procedure: EXCISIONAL TOTAL KNEE ARTHROPLASTY;  Surgeon: Shelda Pal;  Location: [facility];  Service: Orthopedics;  Laterality: Right;  repeat irrigation   and debridement, removal and reinsertation of spacer block  . Joint replacement  2011    left knee x 2 ; 08/2010-right knee-infected  . I&d extremity  03/25/2011    Procedure: IRRIGATION AND DEBRIDEMENT EXTREMITY;  Surgeon: Shelda Pal, MD;  Location: [facility];  Service: Orthopedics;  Laterality: Right;  . Coronary angioplasty  1998    stents 1998   . Total knee revision  05/13/2011    Procedure: TOTAL KNEE REVISION;  Surgeon: Shelda Pal, MD;  Location: [facility];  Service: Orthopedics;  Laterality: Right;  Reimplantation    Family History  Problem Relation Age of Onset  . Heart disease Father   . Heart disease Mother   .  Diabetes Sister   . Heart disease Brother     History   Social History  . Marital Status: Married    Spouse Name: N/A    Number of Children: N/A  . Years of Education: N/A   Occupational History  . diesel repair     part time  . worked at [employer] History Main Topics  . Smoking status: Former Smoker -- 1.00 packs/day for 15 years    Types: Cigars    Quit date: 12/23/2009  . Smokeless tobacco: Never Used     Comment: smoked a pipe for 6-7 y ears  . Alcohol Use: No  . Drug Use: No  . Sexual Activity: None   Other Topics Concern  . None   Social History Narrative  . None    Current outpatient prescriptions:Azelastine-Fluticasone (DYMISTA) 137-50 MCG/ACT SUSP, Place 2 sprays into the nose 2 (two) times daily., Disp: 23 g, Rfl: 6;  BEPREVE 1.5 % SOLN, Place 1 drop into both eyes daily as needed (for eyes). , Disp: , Rfl: ;  calcium carbonate (TUMS EX) 750 MG chewable tablet,  Chew 2 tablets by mouth 4 (four) times daily., Disp: , Rfl: ;  DIGOX 125 MCG tablet, Take 1 tablet by mouth daily., Disp: , Rfl:  escitalopram (LEXAPRO) 20 MG tablet, Take 20 mg by mouth daily. , Disp: , Rfl: ;  gabapentin (NEURONTIN) 400 MG capsule, Take 1 capsule (400 mg total) by mouth 3 (three) times daily., Disp: 90 capsule, Rfl: 5;  influenza vac split quadrivalent PF (FLUZONE QUADRIVALENT) 0.5 ML injection, Inject 0.5 mLs into the muscle once., Disp: , Rfl: ;  lisinopril (PRINIVIL,ZESTRIL) 20 MG tablet, Take 20 mg by mouth daily., Disp: , Rfl:  metoCLOPramide (REGLAN) 10 MG tablet, Take 10 mg by mouth 2 (two) times daily., Disp: , Rfl: ;  morphine (MS CONTIN) 30 MG 12 hr tablet, Take 1 tablet (30 mg total) by mouth 3 (three) times daily., Disp: 90 tablet, Rfl: 0;  NITROSTAT 0.4 MG SL tablet, Place 0.4 mg under the tongue every 5 (five) minutes as needed for chest pain. q 5 minutes x 3, Disp: , Rfl:  omeprazole (PRILOSEC) 20 MG capsule, Take 1 capsule (20 mg total) by mouth daily., Disp: 30 capsule, Rfl: 3;  PRADAXA 150 MG CAPS, Take 150 mg by mouth 2 (two) times daily. , Disp: , Rfl: ;  simvastatin (ZOCOR) 40 MG tablet, Take 40 mg by mouth at bedtime., Disp: , Rfl: ;  sodium-potassium bicarbonate (ALKA-SELTZER GOLD) TBEF dissolvable tablet, Take 2 tablets by mouth daily as needed (for indigestion). , Disp: , Rfl:  [DISCONTINUED] zolpidem (AMBIEN) 5 MG tablet, Take 1 tablet (5 mg total) by mouth at bedtime as needed for sleep., Disp: 20 tablet, Rfl: 0  EXAM:  Filed Vitals:   01/01/13 1049  BP: 180/90  Pulse: 66  Temp: 98.1 F (36.7 C)    Body mass index is 27.83 kg/(m^2).  GENERAL: vitals reviewed and listed above, alert, oriented, appears well hydrated and in no acute distress  HEENT: atraumatic, conjunttiva clear, no obvious abnormalities on inspection of external nose and ears, normal appearance of ear canals and TMs, clear nasal congestion, mild post oropharyngeal erythema with PND, no  tonsillar edema or exudate, no sinus TTP  NECK: no obvious masses on inspection  LUNGS: clear to auscultation bilaterally, no wheezes, rales or rhonchi, good air movement  CV: HRRR, no peripheral edema  MS: moves all extremities without noticeable abnormality  PSYCH: pleasant and cooperative, no obvious depression or anxiety  ASSESSMENT AND PLAN:  Discussed the following assessment and plan:  Upper respiratory infection  -given HPI and exam findings today, a serious infection or illness is unlikely. We discussed potential etiologies, with VURI being most likely, and advised supportive care and monitoring. We discussed treatment side effects, likely course, antibiotic misuse, transmission, and signs of developing a serious illness. -of course, we advised to return or notify a doctor immediately if symptoms worsen or persist or new concerns arise.    Patient Instructions  INSTRUCTIONS FOR UPPER RESPIRATORY INFECTION:  -plenty of rest and fluids  -nasal saline wash 2-3 times daily (use prepackaged nasal saline or bottled/distilled water if making your own)   -clean nose with nasal saline before using the nasal steroid or sinex  -can use sinex nasal spray for drainage and nasal congestion - but do NOT use longer then 3-4 days  -can use tylenol or ibuprofen as directed for aches and sorethroat  -in the winter time, using a humidifier at night is helpful (please follow cleaning instructions)  -if you are taking a cough medication - use only as directed, may also try a teaspoon of honey to coat the throat and throat lozenges  -for sore throat, salt water gargles can help  -follow up if you have fevers, facial pain, tooth pain, difficulty breathing or are worsening or not getting better in 5-7 days      [place] R.

## 2013-01-04 ENCOUNTER — Ambulatory Visit: Payer: Medicare Other | Admitting: Internal Medicine

## 2013-01-08 ENCOUNTER — Encounter: Payer: Self-pay | Admitting: Physical Medicine & Rehabilitation

## 2013-01-08 ENCOUNTER — Ambulatory Visit (HOSPITAL_BASED_OUTPATIENT_CLINIC_OR_DEPARTMENT_OTHER): Payer: Medicare Other | Admitting: Physical Medicine & Rehabilitation

## 2013-01-08 ENCOUNTER — Encounter: Payer: Medicare Other | Attending: Physical Medicine & Rehabilitation

## 2013-01-08 VITALS — BP 180/104 | HR 83 | Resp 14 | Ht 69.0 in | Wt 190.4 lb

## 2013-01-08 DIAGNOSIS — F1920 Other psychoactive substance dependence, uncomplicated: Secondary | ICD-10-CM | POA: Insufficient documentation

## 2013-01-08 DIAGNOSIS — G609 Hereditary and idiopathic neuropathy, unspecified: Secondary | ICD-10-CM | POA: Insufficient documentation

## 2013-01-08 DIAGNOSIS — M545 Low back pain, unspecified: Secondary | ICD-10-CM | POA: Insufficient documentation

## 2013-01-08 DIAGNOSIS — M79609 Pain in unspecified limb: Secondary | ICD-10-CM | POA: Insufficient documentation

## 2013-01-08 DIAGNOSIS — M533 Sacrococcygeal disorders, not elsewhere classified: Secondary | ICD-10-CM

## 2013-01-08 DIAGNOSIS — M47817 Spondylosis without myelopathy or radiculopathy, lumbosacral region: Secondary | ICD-10-CM | POA: Insufficient documentation

## 2013-01-08 DIAGNOSIS — G8929 Other chronic pain: Secondary | ICD-10-CM | POA: Insufficient documentation

## 2013-01-08 DIAGNOSIS — M961 Postlaminectomy syndrome, not elsewhere classified: Secondary | ICD-10-CM | POA: Insufficient documentation

## 2013-01-08 DIAGNOSIS — M549 Dorsalgia, unspecified: Secondary | ICD-10-CM

## 2013-01-08 DIAGNOSIS — M48061 Spinal stenosis, lumbar region without neurogenic claudication: Secondary | ICD-10-CM

## 2013-01-08 DIAGNOSIS — Z96659 Presence of unspecified artificial knee joint: Secondary | ICD-10-CM | POA: Insufficient documentation

## 2013-01-08 DIAGNOSIS — Z79899 Other long term (current) drug therapy: Secondary | ICD-10-CM | POA: Insufficient documentation

## 2013-01-08 MED ORDER — MORPHINE SULFATE ER 30 MG PO TBCR: 30.0000 mg | EXTENDED_RELEASE_TABLET | Freq: Three times a day (TID) | ORAL | Status: DC

## 2013-01-08 NOTE — Progress Notes (Signed)
Subjective:    Patient ID: [name], male    DOB: [birth year], 72 y.o.   MRN: [id number]  HPI Patient was last seen by me 12/08/2012. His pain has been stable in the 5-6/10 range. He is quite satisfied with this. He feels his pain medication and morphine extended release 30 mg twice a days helpful for him. He had a bilateral sacroiliac injection 10/22/2012 which relieved about 50% of his pain in the low back area. He had a previous L3 L4-L5 medial branch block in June of 2014 which also was quite helpful for his pain. He feels his injections are still effective in are not were not yet. Pain Inventory Average Pain 6 Pain Right Now 6 My pain is tingling and aching  In the last 24 hours, has pain interfered with the following? General activity 4 Relation with others 3 Enjoyment of life 7 What TIME of day is your pain at its worst? night Sleep (in general) Fair  Pain is worse with: standing Pain improves with: rest and medication Relief from Meds: 7  Mobility walk with assistance use a walker how many minutes can you walk? 5 ability to climb steps?  yes do you drive?  yes  Function not employed: date last employed na  Neuro/Psych bladder control problems weakness trouble walking spasms anxiety  Prior Studies Any changes since last visit?  yes  Physicians involved in your care Any changes since last visit?  no   Family History  Problem Relation Age of Onset  . Heart disease Father   . Heart disease Mother   . Diabetes Sister   . Heart disease Brother    History   Social History  . Marital Status: Married    Spouse Name: N/A    Number of Children: N/A  . Years of Education: N/A   Occupational History  . diesel repair     part time  . worked at [employer] History Main Topics  . Smoking status: Former Smoker -- 1.00 packs/day for 15 years    Types: Cigars    Quit date: 12/23/2009  . Smokeless tobacco: Never Used     Comment: smoked a pipe for  6-7 y ears  . Alcohol Use: No  . Drug Use: No  . Sexual Activity: None   Other Topics Concern  . None   Social History Narrative  . None   Past Surgical History  Procedure Laterality Date  . Esophagogastroduodenoscopy  2004  . Colonoscopy  2004  . Lumbar laminectomy    . Lumbar fusion    . Knee arthroscopy      left  . Shoulder arthroscopy      right  . Knee arthroscopy      right  . Decompression of the median nerve      left wrist and hand  . Revision of tkr  2011    on left  . Coronary stent placement    . Back surgery  2005-last    lumbar x2  . Excisional total knee arthroplasty  12/25/2010    Procedure: EXCISIONAL TOTAL KNEE ARTHROPLASTY;  Surgeon: Shelda Pal;  Location: [facility];  Service: Orthopedics;  Laterality: Right;  repeat irrigation   and debridement, removal and reinsertation of spacer block  . Joint replacement  2011    left knee x 2 ; 08/2010-right knee-infected  . I&d extremity  03/25/2011    Procedure: IRRIGATION AND DEBRIDEMENT EXTREMITY;  Surgeon: Shelda Pal,  MD;  Location: [facility];  Service: Orthopedics;  Laterality: Right;  . Coronary angioplasty  1998    stents 1998   . Total knee revision  05/13/2011    Procedure: TOTAL KNEE REVISION;  Surgeon: Shelda Pal, MD;  Location: [facility];  Service: Orthopedics;  Laterality: Right;  Reimplantation   Past Medical History  Diagnosis Date  . Hypertension   . Hyperlipidemia   . Preoperative cardiovascular examination   . Spinal stenosis, lumbar   . CAS (cerebral atherosclerosis)   . Erectile dysfunction   . Preventative health care   . Benign prostatic hypertrophy with urinary obstruction   . Family history of early CAD     male 1st degree relative <50  . Peptic ulcer disease   . Low back pain   . Superficial spreading melanoma   . Sleep apnea, obstructive     CPAP-does not use  . HA (headache)     sinus headaches  . Arthritis     knees  . Myocardial infarction 1997    Hx MI 1997 and 1998   . Anxiety   . Pneumonia 2005  . Anemia   . Depression     takes Zoloft  . Neuropathy, autonomic, idiopathic peripheral, other   . Blood transfusion 1 yr. ago    jerking,fever-after blood transfusion  . Blood transfusion     given wrong blood  . Coronary artery disease 1998    cardiac stent/ Clearance Dr Lovell Sheehan and Jacinto Halim with note on chart  . Dysrhythmia     hx of atrial fib per ov note of 12/12   . Bradycardia     hx of bradycardia in past per ov note of 12/12  . GERD (gastroesophageal reflux disease)    BP 184/103  Pulse 83  Resp 14  Ht 5\' 9"  (1.753 m)  Wt 190 lb 6.4 oz (86.365 kg)  BMI 28.10 kg/m2  SpO2 94%     Review of Systems  Gastrointestinal: Positive for constipation.  Genitourinary: Positive for decreased urine volume.       Bladder control problems   Musculoskeletal: Positive for back pain and gait problem.  Neurological: Positive for weakness.       Spasms  Psychiatric/Behavioral: The patient is nervous/anxious.   All other systems reviewed and are negative.       Objective:   Physical Exam  Lumbar range of motion has 75% of forward flexion less than 25% of extension Motor strength /5 bilateral hip flexors, 5/5 bilateral knee extensors 3 minus/5 right ankle dorsiflexor in 3 minus/5 left ankle dorsiflexor. Wearing foot up orthosis  No lumbar tenderness. Pain with lumbar extension. Normal sensation at the knees reduced sensation at the ankles and below Ambulates with a rolling walker      Assessment & Plan:  1. Lumbar post lami with good improvements after both MBB and SI injections. RTC 1 mo , may need to repeat injections in the next 1-2 months Will recheck in one month. If back pain starts increasing would repeat medial branch blocks. If buttocks pain starts increasing would repeat sacroiliac injections

## 2013-01-11 ENCOUNTER — Encounter: Payer: Self-pay | Admitting: Internal Medicine

## 2013-01-11 ENCOUNTER — Ambulatory Visit (INDEPENDENT_AMBULATORY_CARE_PROVIDER_SITE_OTHER): Payer: Medicare Other | Admitting: Internal Medicine

## 2013-01-11 VITALS — BP 126/74 | HR 65 | Ht 68.0 in | Wt 187.8 lb

## 2013-01-11 DIAGNOSIS — J309 Allergic rhinitis, unspecified: Secondary | ICD-10-CM

## 2013-01-11 DIAGNOSIS — G4733 Obstructive sleep apnea (adult) (pediatric): Secondary | ICD-10-CM

## 2013-01-11 NOTE — Patient Instructions (Signed)
Order- DME [facility]  Change CPAP to fixed 5 cwp  Try replacing Dymista with otc Nasacort nasal steroid spray, 1-2 puffs daily in each nostril  If necessary, ok to use Afrin otc nasal spray for nasal stuffiness, just once daily in each nostril, so you can wear your CPAP

## 2013-01-11 NOTE — Progress Notes (Signed)
Subjective:    Patient ID: [name], male    DOB: 1940-03-21, 72 y.o.   MRN: [id number]  HPI 08/24/12- 36 yoM former smoker referred courtesy of Dr Lovell Sheehan for OSA He had original sleep study in 1980's, but couldn't get used to it. Wife has been telling him that loud snore is worse, and he admits being tired in daytime. NPSG 4/24 /14- AHI 32/ hr  10/01/12- 71 yoM former smoker referred courtesy of Dr Lovell Sheehan for OSA FOLLOWS FOR: started on CPAP auto/ [facility] first time last night; has sinus headache today History of frequent nasal congestion usually treated with Benadryl. Does have Flonase. Not sure if this is why he had headache after CPAP.  01/11/13- 72 yoM former smoker referred courtesy of Dr Lovell Sheehan for OSA FOLLOWS FOR: Wears CPAP every night for about 3 hours; DME is [facility] and has not heard from them since 10-2012. CPAP blamed for nasal congestion- can only wear 3 hours. Download reviewed. Dymista overdries. Had used Flonase before that. Download in August showed the short usage times and inadequate control. Pressure was 5.  ROS-see HPI Constitutional:   No-   weight loss, night sweats, fevers, chills, fatigue, lassitude. HEENT:   + headaches, no-difficulty swallowing, tooth/dental problems, sore throat,       No-  sneezing, itching, ear ache, +-nasal congestion, post nasal drip,  CV:  No-   chest pain, orthopnea, PND, swelling in lower extremities, anasarca,  dizziness, palpitations Resp: No-   shortness of breath with exertion or at rest.              No-   productive cough,  No non-productive cough,  No- coughing up of blood.              No-   change in color of mucus.  No- wheezing.   Skin: No-   rash or lesions. GI:  No-   heartburn, indigestion, abdominal pain, nausea, vomiting, GU:  MS:  No-   joint pain or swelling.   Neuro-     nothing unusual Psych:  No- change in mood or affect. No depression or anxiety.  No memory loss.   Objective:   Physical Exam OBJ- Physical  Exam General- Alert, Oriented, Affect-appropriate, Distress- none acute, medium build. + using walker Skin- rash-none, lesions- none, excoriation- none Lymphadenopathy- none Head- atraumatic            Eyes- Gross vision intact, PERRLA, conjunctivae and secretions clear            Ears- Hearing, canals-normal            Nose- +mucus bridging, no-Septal dev,  polyps, erosion, perforation             Throat- Mallampati II-III , mucosa clear , drainage- none, tonsils- atrophic. +Dentures Neck- flexible , trachea midline, no stridor , thyroid nl, carotid no bruit Chest - symmetrical excursion , unlabored           Heart/CV- RRR , no murmur , no gallop  , no rub, nl s1 s2                           - JVD- none , edema- none, stasis changes- none, varices- none           Lung- clear to P&A, wheeze- none, cough- none , dullness-none, rub- none           Chest wall-  Abd-  Br/ Gen/ Rectal- Not done, not indicated Extrem- cyanosis- none, clubbing, none, atrophy- none, strength- nl. +Using a walker Neuro- grossly intact to observation Assessment & Plan:

## 2013-01-27 NOTE — Assessment & Plan Note (Signed)
Having trouble with CPAP compliance due to nasal congestion.  Plan- Try changing Dymista to Nasacort otc. Set fixed CPAP 5 for now. Try Afrin only at bedtime, and nasal strips. Dentures make oral appliance impractical.

## 2013-01-27 NOTE — Assessment & Plan Note (Signed)
Chronic rhinitis. Discussed airflow and humidifier. Plan- Try changing Dymista to otc Nasacort. Set CPAP at lower pressure- 5. Try short acting Afrin only at night, prn.

## 2013-02-04 ENCOUNTER — Ambulatory Visit: Payer: Medicare Other | Admitting: Physical Medicine & Rehabilitation

## 2013-02-08 ENCOUNTER — Telehealth: Payer: Self-pay

## 2013-02-08 DIAGNOSIS — M549 Dorsalgia, unspecified: Secondary | ICD-10-CM

## 2013-02-08 MED ORDER — MORPHINE SULFATE ER 30 MG PO TBCR: 30.0000 mg | EXTENDED_RELEASE_TABLET | Freq: Three times a day (TID) | ORAL | Status: DC

## 2013-02-08 NOTE — Telephone Encounter (Signed)
Patient missed last appointment due to having surgery.  He did not get his morphine because of this.  He now needs a refill.  Morphine refilled, by Dr Riley Kill because Dr Wynn Banker is out of the office.  Patient will see the RN for a visit.

## 2013-02-09 ENCOUNTER — Encounter: Payer: Self-pay | Admitting: *Deleted

## 2013-02-09 ENCOUNTER — Encounter: Payer: Medicare Other | Attending: Physical Medicine & Rehabilitation | Admitting: *Deleted

## 2013-02-09 VITALS — BP 139/63 | HR 79 | Resp 14

## 2013-02-09 DIAGNOSIS — M47817 Spondylosis without myelopathy or radiculopathy, lumbosacral region: Secondary | ICD-10-CM | POA: Insufficient documentation

## 2013-02-09 DIAGNOSIS — F1920 Other psychoactive substance dependence, uncomplicated: Secondary | ICD-10-CM | POA: Insufficient documentation

## 2013-02-09 DIAGNOSIS — G8929 Other chronic pain: Secondary | ICD-10-CM | POA: Insufficient documentation

## 2013-02-09 DIAGNOSIS — Z96659 Presence of unspecified artificial knee joint: Secondary | ICD-10-CM | POA: Insufficient documentation

## 2013-02-09 DIAGNOSIS — M961 Postlaminectomy syndrome, not elsewhere classified: Secondary | ICD-10-CM | POA: Insufficient documentation

## 2013-02-09 DIAGNOSIS — M545 Low back pain: Secondary | ICD-10-CM

## 2013-02-09 DIAGNOSIS — M533 Sacrococcygeal disorders, not elsewhere classified: Secondary | ICD-10-CM

## 2013-02-09 DIAGNOSIS — G609 Hereditary and idiopathic neuropathy, unspecified: Secondary | ICD-10-CM | POA: Insufficient documentation

## 2013-02-09 DIAGNOSIS — M21371 Foot drop, right foot: Secondary | ICD-10-CM

## 2013-02-09 DIAGNOSIS — M79609 Pain in unspecified limb: Secondary | ICD-10-CM | POA: Insufficient documentation

## 2013-02-09 NOTE — Patient Instructions (Signed)
Follow up one month with RN for pill count and med refill 

## 2013-02-09 NOTE — Progress Notes (Signed)
Here for pill count and medication refills. MS Contin 30 mg # 90 did not bring bottle today.  He left it at home on the kitchen counter.  Reminded he must bring to each visit. Marland Kitchen Refill given.  Opioid risk assessment done and score is 0 . No change in medication or pain levels--  4-5 today.   Follow up in one month with RN for pill count and med refill.  He will bring bottle or he will have to go back and get it next month.  He is having some concerns with his memory lately. Has an appointment with PCP next week and encouraged to discuss this with him. Also, encouraged to get some tennis balls for the back legs of his walker so it will glide more smoothly over flooring.

## 2013-02-13 ENCOUNTER — Encounter: Payer: Self-pay | Admitting: Family Medicine

## 2013-02-13 ENCOUNTER — Ambulatory Visit: Payer: Medicare Other

## 2013-02-13 ENCOUNTER — Ambulatory Visit (INDEPENDENT_AMBULATORY_CARE_PROVIDER_SITE_OTHER): Payer: Medicare Other | Admitting: Family Medicine

## 2013-02-13 VITALS — BP 124/76 | HR 79 | Temp 100.0°F | Resp 20 | Wt 183.0 lb

## 2013-02-13 DIAGNOSIS — R079 Chest pain, unspecified: Secondary | ICD-10-CM

## 2013-02-13 DIAGNOSIS — J209 Acute bronchitis, unspecified: Secondary | ICD-10-CM

## 2013-02-13 DIAGNOSIS — J189 Pneumonia, unspecified organism: Secondary | ICD-10-CM

## 2013-02-13 DIAGNOSIS — R6889 Other general symptoms and signs: Secondary | ICD-10-CM

## 2013-02-13 DIAGNOSIS — M545 Low back pain, unspecified: Secondary | ICD-10-CM

## 2013-02-13 LAB — POCT URINALYSIS DIPSTICK
Bilirubin, UA: NEGATIVE
Glucose, UA: NEGATIVE
Ketones, UA: NEGATIVE
Leukocytes, UA: NEGATIVE
Spec Grav, UA: 1.015
pH, UA: 8

## 2013-02-13 LAB — POCT CBC
Granulocyte percent: 90 %G — AB (ref 37–80)
HCT, POC: 34.3 % — AB (ref 43.5–53.7)
Hemoglobin: 10.5 g/dL — AB (ref 14.1–18.1)
Lymph, poc: 0.7 (ref 0.6–3.4)
MCH, POC: 30.3 pg (ref 27–31.2)
MCHC: 30.6 g/dL — AB (ref 31.8–35.4)
MCV: 99.2 fL — AB (ref 80–97)
MID (cbc): 0.6 (ref 0–0.9)
MPV: 6.9 fL (ref 0–99.8)
POC Granulocyte: 12.2 — AB (ref 2–6.9)
POC LYMPH PERCENT: 5.3 %L — AB (ref 10–50)
POC MID %: 4.7 %M (ref 0–12)
Platelet Count, POC: 166 10*3/uL (ref 142–424)
RBC: 3.46 M/uL — AB (ref 4.69–6.13)
RDW, POC: 15.4 %
WBC: 13.5 10*3/uL — AB (ref 4.6–10.2)

## 2013-02-13 LAB — POCT UA - MICROSCOPIC ONLY
Casts, Ur, LPF, POC: NEGATIVE
Crystals, Ur, HPF, POC: NEGATIVE
Mucus, UA: NEGATIVE
Yeast, UA: NEGATIVE

## 2013-02-13 MED ORDER — CEFDINIR 300 MG PO CAPS: ORAL_CAPSULE | ORAL | Status: DC

## 2013-02-13 NOTE — Patient Instructions (Addendum)
Drink plenty of fluids  Take Mucinex plain to help thin secretions  Take Omnicef one twice daily for antibiotic  Occasionally use a Tylenol or ibuprofen for fever. However due to you to the history of liver disease and the history of being on a blood thinner I am a little concerned about both medicines in you. You should in the future talk to your primary care doctor about what you can safely use if you have fevers.

## 2013-02-13 NOTE — Progress Notes (Signed)
Subjective: Starting Christmas Day this gentleman started feeling bad. He initially had a worsening of his chronic low back pain, then he got pain on his body in a into his anterior chest. He started running a fever and has felt lousy. He had some nausea but no vomiting. His temperature was 102 at home. He denies any cough or shortness of breath. His chest discomfort is on the right more.  He had pneumonia a couple of weeks ago apparently, treated by Dr. little. He says it cleared up. It was on the left side.  Objective: Alert elderly man in no major distress. His TMs are normal. Throat clear. Neck supple without nodes. Chest clear to auscultation. Heart regular without murmurs. And soft without mass or tenderness. No CVA tenderness.  O2 sat when he checked and was 91. In reviewing his chart that's a little low for him despite his history of lung problems. I retook his sat and it was 88, having cough a couple of times and it came right up to 95.  EKG normal  He does have multiple skin cancers on his face. The one on his left cheek keep straining some pus. Apparently it is going to be operated on sometime soon.  Results for orders placed in visit on 02/13/13  POCT CBC      Result Value Range   WBC 13.5 (*) 4.6 - 10.2 K/uL   Lymph, poc 0.7  0.6 - 3.4   POC LYMPH PERCENT 5.3 (*) 10 - 50 %L   MID (cbc) 0.6  0 - 0.9   POC MID % 4.7  0 - 12 %M   POC Granulocyte 12.2 (*) 2 - 6.9   Granulocyte percent 90.0 (*) 37 - 80 %G   RBC 3.46 (*) 4.69 - 6.13 M/uL   Hemoglobin 10.5 (*) 14.1 - 18.1 g/dL   HCT, POC 65.7 (*) 84.6 - 53.7 %   MCV 99.2 (*) 80 - 97 fL   MCH, POC 30.3  27 - 31.2 pg   MCHC 30.6 (*) 31.8 - 35.4 g/dL   RDW, POC 96.2     Platelet Count, POC 166  142 - 424 K/uL   MPV 6.9  0 - 99.8 fL  POCT URINALYSIS DIPSTICK      Result Value Range   Color, UA yellow     Clarity, UA clear     Glucose, UA neg     Bilirubin, UA neg     Ketones, UA neg     Spec Grav, UA 1.015     Blood, UA neg      pH, UA 8.0     Protein, UA neg     Urobilinogen, UA 1.0     Nitrite, UA neg     Leukocytes, UA Negative    POCT UA - MICROSCOPIC ONLY      Result Value Range   WBC, Ur, HPF, POC 0-2     RBC, urine, microscopic 1-2     Bacteria, U Microscopic trace     Mucus, UA neg     Epithelial cells, urine per micros 0-2     Crystals, Ur, HPF, POC neg     Casts, Ur, LPF, POC neg     Yeast, UA neg    POCT INFLUENZA A/B      Result Value Range   Influenza A, POC Negative     Influenza B, POC Negative     Assessment: Fever Chest pain History of recent pneumonia Back  Skin cancer  Plan: [facility] reading (PRIMARY) by  Dr. Alwyn Ren Prominent bronchovascular markings  .  Will rx antibiotics.  RTC or ER if at all worse.

## 2013-02-14 ENCOUNTER — Inpatient Hospital Stay (HOSPITAL_COMMUNITY)
Admission: EM | Admit: 2013-02-14 | Discharge: 2013-02-16 | DRG: 194 | Disposition: A | Discharge status: Home / Self Care | Payer: Medicare Other | Attending: Internal Medicine | Admitting: Internal Medicine

## 2013-02-14 ENCOUNTER — Encounter (HOSPITAL_COMMUNITY): Payer: Self-pay | Admitting: Emergency Medicine

## 2013-02-14 ENCOUNTER — Emergency Department (HOSPITAL_COMMUNITY): Payer: Medicare Other

## 2013-02-14 DIAGNOSIS — Z85820 Personal history of malignant melanoma of skin: Secondary | ICD-10-CM

## 2013-02-14 DIAGNOSIS — F3289 Other specified depressive episodes: Secondary | ICD-10-CM | POA: Diagnosis present

## 2013-02-14 DIAGNOSIS — I10 Essential (primary) hypertension: Secondary | ICD-10-CM

## 2013-02-14 DIAGNOSIS — G4733 Obstructive sleep apnea (adult) (pediatric): Secondary | ICD-10-CM | POA: Diagnosis present

## 2013-02-14 DIAGNOSIS — Z8711 Personal history of peptic ulcer disease: Secondary | ICD-10-CM

## 2013-02-14 DIAGNOSIS — Z96659 Presence of unspecified artificial knee joint: Secondary | ICD-10-CM

## 2013-02-14 DIAGNOSIS — E785 Hyperlipidemia, unspecified: Secondary | ICD-10-CM | POA: Diagnosis present

## 2013-02-14 DIAGNOSIS — E860 Dehydration: Secondary | ICD-10-CM | POA: Diagnosis present

## 2013-02-14 DIAGNOSIS — M48061 Spinal stenosis, lumbar region without neurogenic claudication: Secondary | ICD-10-CM | POA: Diagnosis present

## 2013-02-14 DIAGNOSIS — I4891 Unspecified atrial fibrillation: Secondary | ICD-10-CM | POA: Diagnosis present

## 2013-02-14 DIAGNOSIS — J189 Pneumonia, unspecified organism: Principal | ICD-10-CM

## 2013-02-14 DIAGNOSIS — R0902 Hypoxemia: Secondary | ICD-10-CM

## 2013-02-14 DIAGNOSIS — Z9861 Coronary angioplasty status: Secondary | ICD-10-CM

## 2013-02-14 DIAGNOSIS — I252 Old myocardial infarction: Secondary | ICD-10-CM

## 2013-02-14 DIAGNOSIS — Z8249 Family history of ischemic heart disease and other diseases of the circulatory system: Secondary | ICD-10-CM

## 2013-02-14 DIAGNOSIS — F329 Major depressive disorder, single episode, unspecified: Secondary | ICD-10-CM | POA: Diagnosis present

## 2013-02-14 DIAGNOSIS — I2510 Atherosclerotic heart disease of native coronary artery without angina pectoris: Secondary | ICD-10-CM | POA: Diagnosis present

## 2013-02-14 DIAGNOSIS — Z833 Family history of diabetes mellitus: Secondary | ICD-10-CM

## 2013-02-14 DIAGNOSIS — I498 Other specified cardiac arrhythmias: Secondary | ICD-10-CM | POA: Diagnosis present

## 2013-02-14 DIAGNOSIS — G8929 Other chronic pain: Secondary | ICD-10-CM

## 2013-02-14 DIAGNOSIS — N179 Acute kidney failure, unspecified: Secondary | ICD-10-CM

## 2013-02-14 DIAGNOSIS — M545 Low back pain, unspecified: Secondary | ICD-10-CM

## 2013-02-14 DIAGNOSIS — Z808 Family history of malignant neoplasm of other organs or systems: Secondary | ICD-10-CM

## 2013-02-14 DIAGNOSIS — Z87891 Personal history of nicotine dependence: Secondary | ICD-10-CM

## 2013-02-14 DIAGNOSIS — I672 Cerebral atherosclerosis: Secondary | ICD-10-CM | POA: Diagnosis present

## 2013-02-14 DIAGNOSIS — F411 Generalized anxiety disorder: Secondary | ICD-10-CM | POA: Diagnosis present

## 2013-02-14 DIAGNOSIS — K219 Gastro-esophageal reflux disease without esophagitis: Secondary | ICD-10-CM | POA: Diagnosis present

## 2013-02-14 LAB — COMPREHENSIVE METABOLIC PANEL
ALT: 44 U/L (ref 0–53)
Alkaline Phosphatase: 95 U/L (ref 39–117)
CO2: 23 mEq/L (ref 19–32)
Calcium: 8.8 mg/dL (ref 8.4–10.5)
Creatinine, Ser: 1.65 mg/dL — ABNORMAL HIGH (ref 0.50–1.35)
GFR calc Af Amer: 46 mL/min — ABNORMAL LOW (ref >90)
GFR calc non Af Amer: 39 mL/min — ABNORMAL LOW (ref >90)
Glucose, Bld: 101 mg/dL — ABNORMAL HIGH (ref 70–99)
Potassium: 4.1 mEq/L (ref 3.5–5.1)
Sodium: 135 mEq/L (ref 135–145)

## 2013-02-14 LAB — CBC WITH DIFFERENTIAL/PLATELET
Eosinophils Relative: 0 % (ref 0–5)
HCT: 35.3 % — ABNORMAL LOW (ref 39.0–52.0)
Hemoglobin: 12 g/dL — ABNORMAL LOW (ref 13.0–17.0)
Lymphocytes Relative: 6 % — ABNORMAL LOW (ref 12–46)
Lymphs Abs: 0.6 10*3/uL — ABNORMAL LOW (ref 0.7–4.0)
MCV: 93.1 fL (ref 78.0–100.0)
Monocytes Absolute: 0.4 10*3/uL (ref 0.1–1.0)
Monocytes Relative: 4 % (ref 3–12)
Neutro Abs: 8.3 10*3/uL — ABNORMAL HIGH (ref 1.7–7.7)
Neutrophils Relative %: 90 % — ABNORMAL HIGH (ref 43–77)
RBC: 3.79 MIL/uL — ABNORMAL LOW (ref 4.22–5.81)
RDW: 14.3 % (ref 11.5–15.5)
WBC: 9.3 10*3/uL (ref 4.0–10.5)

## 2013-02-14 LAB — CG4 I-STAT (LACTIC ACID): Lactic Acid, Venous: 1.42 mmol/L (ref 0.5–2.2)

## 2013-02-14 LAB — URINALYSIS, ROUTINE W REFLEX MICROSCOPIC
Bilirubin Urine: NEGATIVE
Ketones, ur: NEGATIVE mg/dL
Nitrite: NEGATIVE
Specific Gravity, Urine: 1.019 (ref 1.005–1.030)
Urobilinogen, UA: 1 mg/dL (ref 0.0–1.0)

## 2013-02-14 LAB — URINE MICROSCOPIC-ADD ON

## 2013-02-14 MED ORDER — IPRATROPIUM BROMIDE 0.02 % IN SOLN
0.5000 mg | Freq: Once | RESPIRATORY_TRACT | Status: AC
  Administered 2013-02-14: 0.5 mg via RESPIRATORY_TRACT
  Filled 2013-02-14: qty 2.5

## 2013-02-14 MED ORDER — ALBUTEROL SULFATE (5 MG/ML) 0.5% IN NEBU
10.0000 mg | INHALATION_SOLUTION | Freq: Once | RESPIRATORY_TRACT | Status: AC
  Administered 2013-02-14: 10 mg via RESPIRATORY_TRACT

## 2013-02-14 MED ORDER — SODIUM CHLORIDE 0.9 % IV SOLN: 1000.0000 mL | INTRAVENOUS | Status: DC

## 2013-02-14 MED ORDER — DEXTROSE 5 % IV SOLN
1.0000 g | Freq: Once | INTRAVENOUS | Status: AC
  Administered 2013-02-14: 1 g via INTRAVENOUS
  Filled 2013-02-14: qty 10

## 2013-02-14 MED ORDER — CEFTRIAXONE SODIUM 1 G IJ SOLR
1.0000 g | INTRAMUSCULAR | Status: DC
  Administered 2013-02-15: 1 g via INTRAVENOUS
  Filled 2013-02-14 (×2): qty 10

## 2013-02-14 MED ORDER — PREDNISONE 20 MG PO TABS
60.0000 mg | ORAL_TABLET | Freq: Once | ORAL | Status: AC
  Administered 2013-02-14: 60 mg via ORAL
  Filled 2013-02-14: qty 3

## 2013-02-14 MED ORDER — SODIUM CHLORIDE 0.9 % IV SOLN
1000.0000 mL | Freq: Once | INTRAVENOUS | Status: AC
  Administered 2013-02-14: 1000 mL via INTRAVENOUS

## 2013-02-14 MED ORDER — SODIUM CHLORIDE 0.9 % IV SOLN: 1000.0000 mL | Freq: Once | INTRAVENOUS | Status: DC

## 2013-02-14 MED ORDER — DEXTROSE 5 % IV SOLN
500.0000 mg | Freq: Once | INTRAVENOUS | Status: AC
  Administered 2013-02-14: 500 mg via INTRAVENOUS

## 2013-02-14 MED ORDER — DEXTROSE 5 % IV SOLN
500.0000 mg | INTRAVENOUS | Status: DC
  Administered 2013-02-15: 500 mg via INTRAVENOUS
  Filled 2013-02-14 (×2): qty 500

## 2013-02-14 NOTE — Progress Notes (Signed)
Brief Pharmacy Note: Rocephin/Azithromycin for CAP, admit with hypoxia, pharmacy to dose antibiotics.  Rocephin and Azithromycin are not renally adjusted, no change to doses, no need for pharmacy to follow. Consider change to po abx when able.  Thank you,  Otho Bellows PharmD Pager [phone] 02/14/2013, 8:56 PM

## 2013-02-14 NOTE — ED Provider Notes (Signed)
CSN: [id number]     Arrival date & time 02/14/13  1944 History   First MD Initiated Contact with Patient 02/14/13 2037     Chief Complaint  Patient presents with  . Pneumonia   (Consider location/radiation/quality/duration/timing/severity/associated sxs/prior Treatment) HPI 72 year old male lives at home with his wife history of sleep apnea but no chronic lung disease presents with a few days of fevers chills generalized weakness mild shortness of breath and slight cough with no altered mental status no rash no vomiting no diarrhea no abdominal pain but is too weak to walk today and was found hypoxic on room air 84% by EMS improved to the 90s with nasal cannula oxygen. He is mildly short of breath upon arrival. Treatment prior to arrival consist of albuterol nebulizers from EMS and nasal oxygen from EMS. He started on antibiotic a couple days ago for suspected pneumonia. He also apparently had possible pneumonia 2-3 weeks ago treated with antibiotics orally at that time. Past Medical History  Diagnosis Date  . Hypertension   . Hyperlipidemia   . Preoperative cardiovascular examination   . Spinal stenosis, lumbar   . CAS (cerebral atherosclerosis)   . Erectile dysfunction   . Preventative health care   . Benign prostatic hypertrophy with urinary obstruction   . Family history of early CAD     male 1st degree relative <50  . Peptic ulcer disease   . Low back pain   . Superficial spreading melanoma   . Sleep apnea, obstructive     CPAP-does not use  . HA (headache)     sinus headaches  . Arthritis     knees  . Myocardial infarction 1997    Hx MI 1997 and 1998  . Anxiety   . Pneumonia 2005  . Anemia   . Depression     takes Zoloft  . Neuropathy, autonomic, idiopathic peripheral, other   . Blood transfusion 1 yr. ago    jerking,fever-after blood transfusion  . Blood transfusion     given wrong blood  . Coronary artery disease 1998    cardiac stent/ Clearance Dr Lovell Sheehan and  Jacinto Halim with note on chart  . Dysrhythmia     hx of atrial fib per ov note of 12/12   . Bradycardia     hx of bradycardia in past per ov note of 12/12  . GERD (gastroesophageal reflux disease)    Past Surgical History  Procedure Laterality Date  . Esophagogastroduodenoscopy  2004  . Colonoscopy  2004  . Lumbar laminectomy    . Lumbar fusion    . Knee arthroscopy      left  . Shoulder arthroscopy      right  . Knee arthroscopy      right  . Decompression of the median nerve      left wrist and hand  . Revision of tkr  2011    on left  . Coronary stent placement    . Back surgery  2005-last    lumbar x2  . Excisional total knee arthroplasty  12/25/2010    Procedure: EXCISIONAL TOTAL KNEE ARTHROPLASTY;  Surgeon: Shelda Pal;  Location: [facility];  Service: Orthopedics;  Laterality: Right;  repeat irrigation   and debridement, removal and reinsertation of spacer block  . Joint replacement  2011    left knee x 2 ; 08/2010-right knee-infected  . I&d extremity  03/25/2011    Procedure: IRRIGATION AND DEBRIDEMENT EXTREMITY;  Surgeon: Shelda Pal, MD;  Location:  [facility];  Service: Orthopedics;  Laterality: Right;  . Coronary angioplasty  1998    stents 1998   . Total knee revision  05/13/2011    Procedure: TOTAL KNEE REVISION;  Surgeon: Shelda Pal, MD;  Location: [facility];  Service: Orthopedics;  Laterality: Right;  Reimplantation   Family History  Problem Relation Age of Onset  . Heart disease Father   . Heart disease Mother   . Diabetes Sister   . Heart disease Brother    History  Substance Use Topics  . Smoking status: Former Smoker -- 1.00 packs/day for 15 years    Types: Cigars    Quit date: 12/23/2009  . Smokeless tobacco: Never Used     Comment: smoked a pipe for 6-7 y ears  . Alcohol Use: No    Review of Systems 10 Systems reviewed and are negative for acute change except as noted in the HPI. Allergies  Acetaminophen  Home Medications   No current outpatient  prescriptions on file. BP 130/91  Pulse 59  Temp(Src) 97.5 F (36.4 C) (Axillary)  Resp 20  Ht 5\' 8"  (1.727 m)  Wt 194 lb 0.1 oz (88 kg)  BMI 29.51 kg/m2  SpO2 94% Physical Exam  Nursing note and vitals reviewed. Constitutional:  Awake, alert, nontoxic appearance.  HENT:  Head: Atraumatic.  Eyes: Right eye exhibits no discharge. Left eye exhibits no discharge.  Neck: Neck supple.  Cardiovascular: Normal rate and regular rhythm.   No murmur heard. Pulmonary/Chest: He is in respiratory distress. He has wheezes. He has rales. He exhibits no tenderness.  Diffuse rhonchi crackles and wheezing with pulse oximetry hypoxic room air 84% improved to 90s with nasal cannula oxygen, no retractions no subcutaneous muscle usage is able to speak full sentences with nasal cannula oxygen  Abdominal: Soft. Bowel sounds are normal. He exhibits no distension. There is no tenderness. There is no rebound and no guarding.  Musculoskeletal: He exhibits no edema and no tenderness.  Baseline ROM, no obvious new focal weakness.  Neurological: He is alert.  Mental status and motor strength appears baseline for patient and situation.  Skin: No rash noted.  Psychiatric: He has a normal mood and affect.    ED Course  Procedures (including critical care time) ECG (not available in Muse at time of interpretation) sinus rhythm, ventricular rate 88, PVCs, left ventricular hypertrophy, no comparison ECG immediately available  Pt stable in ED with no significant deterioration in condition.Patient / Family / Caregiver informed of clinical course, understand medical decision-making process, and agree with plan.  CRITICAL CARE Performed by: Hurman Horn Total critical care time: including continuous neb. Critical care time was exclusive of separately billable procedures and treating other patients. Critical care was necessary to treat or prevent imminent or life-threatening deterioration. Critical care was  time spent personally by me on the following activities: development of treatment plan with patient and/or surrogate as well as nursing, discussions with consultants, evaluation of patient's response to treatment, examination of patient, obtaining history from patient or surrogate, ordering and performing treatments and interventions, ordering and review of laboratory studies, ordering and review of radiographic studies, pulse oximetry and re-evaluation of patient's condition.  [facility] to see now in ED.2240 Labs Review Labs Reviewed  CBC WITH DIFFERENTIAL - Abnormal; Notable for the following:    RBC 3.79 (*)    Hemoglobin 12.0 (*)    HCT 35.3 (*)    Platelets 145 (*)    Neutrophils Relative % 90 (*)  Neutro Abs 8.3 (*)    Lymphocytes Relative 6 (*)    Lymphs Abs 0.6 (*)    All other components within normal limits  COMPREHENSIVE METABOLIC PANEL - Abnormal; Notable for the following:    Glucose, Bld 101 (*)    BUN 32 (*)    Creatinine, Ser 1.65 (*)    Albumin 3.3 (*)    AST 126 (*)    GFR calc non Af Amer 39 (*)    GFR calc Af Amer 46 (*)    All other components within normal limits  URINALYSIS, ROUTINE W REFLEX MICROSCOPIC - Abnormal; Notable for the following:    Hgb urine dipstick LARGE (*)    Protein, ur 100 (*)    All other components within normal limits  COMPREHENSIVE METABOLIC PANEL - Abnormal; Notable for the following:    Sodium 132 (*)    Glucose, Bld 168 (*)    BUN 28 (*)    Creatinine, Ser 1.43 (*)    Albumin 3.1 (*)    AST 125 (*)    GFR calc non Af Amer 47 (*)    GFR calc Af Amer 54 (*)    All other components within normal limits  INFLUENZA PANEL BY PCR - Abnormal; Notable for the following:    Influenza A By PCR POSITIVE (*)    All other components within normal limits  CK - Abnormal; Notable for the following:    Total CK 3015 (*)    All other components within normal limits  CULTURE, BLOOD (ROUTINE X 2)  CULTURE, BLOOD (ROUTINE X 2)  URINE CULTURE   CULTURE, EXPECTORATED SPUTUM-ASSESSMENT  GRAM STAIN  URINE MICROSCOPIC-ADD ON  LEGIONELLA ANTIGEN, URINE  STREP PNEUMONIAE URINARY ANTIGEN  MAGNESIUM  PHOSPHORUS  BASIC METABOLIC PANEL  CG4 I-STAT (LACTIC ACID)   Imaging Review Dg Chest Port 1 View  02/14/2013   CLINICAL DATA:  Cough and fever.  EXAM: PORTABLE CHEST - 1 VIEW  COMPARISON:  02/13/2013  FINDINGS: Shallow inspiration with mild elevation of the left hemidiaphragm. Normal heart size and pulmonary vascularity. Developing perihilar infiltration on the left may represent early pneumonia. No pneumothorax. No pleural effusions. Degenerative changes in the shoulders. Interval resection or resorption of the distal clavicles.  IMPRESSION: Shallow inspiration. Developing patchy infiltration in the left perihilar region may represent early pneumonia.   Electronically Signed   By: Burman Nieves M.D.   On: 02/14/2013 21:10    EKG Interpretation    Date/Time:    Ventricular Rate:    PR Interval:    QRS Duration:   QT Interval:    QTC Calculation:   R Axis:     Text Interpretation:              MDM   1. CAP (community acquired pneumonia)   2. Hypoxia   3. Acute renal failure   4. Chronic low back pain   5. Unspecified essential hypertension    The patient appears reasonably stabilized for admission considering the current resources, flow, and capabilities available in the ED at this time, and I doubt any other [facility] requiring further screening and/or treatment in the ED prior to admission.    Hurman Horn, MD 02/15/13 2022

## 2013-02-14 NOTE — ED Notes (Signed)
Pt BIB EMS. Pt has had pneumonia for the past 2 weeks. Pt has taken PCN for same. Pt states now he is still not feeling well. C/o worsening shortness of breath with exertion. Pt has diminished breath sounds in lower lobes per EMS. Pt was given albuterol tx x 1 per EMS. Pt was 84% on RA per EMS. Pt alert, no acute distress. Skin warm and dry.

## 2013-02-14 NOTE — H&P (Signed)
PCP:  Carrie Mew, MD  Cardiology: Jacinto Halim  Chief Complaint:  Confusion and fever  HPI: [name] is a 72 y.o. male   has a past medical history of Hypertension; Hyperlipidemia; Preoperative cardiovascular examination; Spinal stenosis, lumbar; CAS (cerebral atherosclerosis); Erectile dysfunction; Preventative health care; Benign prostatic hypertrophy with urinary obstruction; Family history of early CAD; Peptic ulcer disease; Low back pain; Superficial spreading melanoma; Sleep apnea, obstructive; HA (headache); Arthritis; Myocardial infarction (1997); Anxiety; Pneumonia (2005); Anemia; Depression; Neuropathy, autonomic, idiopathic peripheral, other; Blood transfusion (1 yr. ago); Blood transfusion; Coronary artery disease (1998); Dysrhythmia; Bradycardia; and GERD (gastroesophageal reflux disease).   Presented with  For the past 2-3 days he was not feeling well. Yesterday he had a fever up to 103 and went to urgent care where he was diagnosed with PNA and started on ceftindir. He continued to feel poorly and presented to ER where diagnosis of PNA was confirmed but he was also noted to be hypoxic. Patient reports minimal coughing. He has not been drinking much fluids. Patient have had an other bout of pneumonia about 8 weeks ago treated as an outpatient. He is not on home oxygen. Pateint has hx of chronic back pain. The family have had a cold. He and his wife have had a flu shot but still developed symptoms. Hospitalist was called for an admission.   Review of Systems:    Pertinent positives include: Fevers, chills, non-productive cough, confusion  Constitutional:  No weight loss, night sweats, fatigue, weight loss  HEENT:  No headaches, Difficulty swallowing,Tooth/dental problems,Sore throat,  No sneezing, itching, ear ache, nasal congestion, post nasal drip,  Cardio-vascular:  No chest pain, Orthopnea, PND, anasarca, dizziness, palpitations.no Bilateral lower extremity swelling   GI:  No heartburn, indigestion, abdominal pain, nausea, vomiting, diarrhea, change in bowel habits, loss of appetite, melena, blood in stool, hematemesis Resp:  no shortness of breath at rest. No dyspnea on exertion, No excess mucus, no productive cough, No No coughing up of blood. No change in color of mucus. No wheezing. Skin:  no rash or lesions. No jaundice GU:  no dysuria, change in color of urine, no urgency or frequency. No straining to urinate.  No flank pain.  Musculoskeletal:  No joint pain or no joint swelling. No decreased range of motion. No back pain.  Psych:  No change in mood or affect. No depression or anxiety. No memory loss.  Neuro: no localizing neurological complaints, no tingling, no weakness, no double vision, no gait abnormality, no slurred speech, no   Otherwise ROS are negative except for above, 10 systems were reviewed  Past Medical History: Past Medical History  Diagnosis Date  . Hypertension   . Hyperlipidemia   . Preoperative cardiovascular examination   . Spinal stenosis, lumbar   . CAS (cerebral atherosclerosis)   . Erectile dysfunction   . Preventative health care   . Benign prostatic hypertrophy with urinary obstruction   . Family history of early CAD     male 1st degree relative <50  . Peptic ulcer disease   . Low back pain   . Superficial spreading melanoma   . Sleep apnea, obstructive     CPAP-does not use  . HA (headache)     sinus headaches  . Arthritis     knees  . Myocardial infarction 1997    Hx MI 1997 and 1998  . Anxiety   . Pneumonia 2005  . Anemia   . Depression     takes Zoloft  .  Neuropathy, autonomic, idiopathic peripheral, other   . Blood transfusion 1 yr. ago    jerking,fever-after blood transfusion  . Blood transfusion     given wrong blood  . Coronary artery disease 1998    cardiac stent/ Clearance Dr Lovell Sheehan and Jacinto Halim with note on chart  . Dysrhythmia     hx of atrial fib per ov note of 12/12   .  Bradycardia     hx of bradycardia in past per ov note of 12/12  . GERD (gastroesophageal reflux disease)    Past Surgical History  Procedure Laterality Date  . Esophagogastroduodenoscopy  2004  . Colonoscopy  2004  . Lumbar laminectomy    . Lumbar fusion    . Knee arthroscopy      left  . Shoulder arthroscopy      right  . Knee arthroscopy      right  . Decompression of the median nerve      left wrist and hand  . Revision of tkr  2011    on left  . Coronary stent placement    . Back surgery  2005-last    lumbar x2  . Excisional total knee arthroplasty  12/25/2010    Procedure: EXCISIONAL TOTAL KNEE ARTHROPLASTY;  Surgeon: Shelda Pal;  Location: [facility];  Service: Orthopedics;  Laterality: Right;  repeat irrigation   and debridement, removal and reinsertation of spacer block  . Joint replacement  2011    left knee x 2 ; 08/2010-right knee-infected  . I&d extremity  03/25/2011    Procedure: IRRIGATION AND DEBRIDEMENT EXTREMITY;  Surgeon: Shelda Pal, MD;  Location: [facility];  Service: Orthopedics;  Laterality: Right;  . Coronary angioplasty  1998    stents 1998   . Total knee revision  05/13/2011    Procedure: TOTAL KNEE REVISION;  Surgeon: Shelda Pal, MD;  Location: [facility];  Service: Orthopedics;  Laterality: Right;  Reimplantation     Medications: Prior to Admission medications   Medication Sig Start Date End Date Taking? Authorizing Provider  cefdinir (OMNICEF) 300 MG capsule Take 300 mg by mouth 2 (two) times daily. Take one twice daily for antibiotic 02/13/13  Yes Peyton Najjar, MD  gabapentin (NEURONTIN) 400 MG capsule Take 1 capsule (400 mg total) by mouth 3 (three) times daily. 09/09/12 09/09/13 Yes Stacie Glaze, MD  lisinopril (PRINIVIL,ZESTRIL) 20 MG tablet Take 40 mg by mouth daily.    Yes Historical Provider, MD  metoCLOPramide (REGLAN) 10 MG tablet Take 10 mg by mouth 2 (two) times daily.  01/23/13  Yes Historical Provider, MD  morphine (MS CONTIN) 30 MG 12  hr tablet Take 1 tablet (30 mg total) by mouth 3 (three) times daily. Must last 30 days 02/08/13  Yes Ranelle Oyster, MD  NITROSTAT 0.4 MG SL tablet Place 0.4 mg under the tongue every 5 (five) minutes as needed for chest pain. q 5 minutes x 3 05/22/12  Yes Historical Provider, MD  omeprazole (PRILOSEC) 20 MG capsule Take 1 capsule (20 mg total) by mouth daily. 10/26/12  Yes Baker Pierini, FNP  pindolol (VISKEN) 10 MG tablet Take 0.5 tablets by mouth 2 (two) times daily.  01/08/13  Yes Historical Provider, MD  PRADAXA 150 MG CAPS Take 150 mg by mouth 2 (two) times daily.  07/27/12  Yes Historical Provider, MD  simvastatin (ZOCOR) 40 MG tablet Take 40 mg by mouth at bedtime.   Yes Historical Provider, MD  sodium-potassium bicarbonate (ALKA-SELTZER  GOLD) TBEF dissolvable tablet Take 2 tablets by mouth daily as needed (for indigestion).    Yes Historical Provider, MD    Allergies:   Allergies  Allergen Reactions  . Acetaminophen Other (See Comments)    LIVER INFLAMMATION IN HIGH DOSES    Social History:  Ambulatory  Walker  Lives at  Home with family   reports that he quit smoking about 3 years ago. His smoking use included Cigars. He has never used smokeless tobacco. He reports that he does not drink alcohol or use illicit drugs.   Family History: family history includes Diabetes in his sister; Heart disease in his brother, father, and mother.    Physical Exam: Patient Vitals for the past 24 hrs:  BP Temp Temp src Pulse Resp SpO2  02/14/13 2227 133/66 mmHg 98.7 F (37.1 C) Oral 77 21 90 %  02/14/13 1956 - 102.8 F (39.3 C) Oral - - -  02/14/13 1954 147/69 mmHg - - 88 22 93 %    1. General:  in No Acute distress 2. Psychological: Alert and  Oriented 3. Head/ENT:   Mois Dry Mucous Membranes                          Head Non traumatic, neck supple                          Normal   Dentition 4. SKIN: Decreased  Skin turgor,  Skin clean Dry and intact no rash, numerous skink  cancers noted 5. Heart: Regular rate and rhythm no Murmur, Rub or gallop 6. Lungs:  no wheezes but crackles  Through out lung fields 7. Abdomen: Soft, non-tender, Non distended 8. Lower extremities: no clubbing, cyanosis, or edema 9. Neurologically Grossly intact, moving all 4 extremities equally 10. MSK: Normal range of motion  body mass index is unknown because there is no weight on file.   Labs on Admission:   Recent Labs  02/14/13 2110  NA 135  K 4.1  CL 98  CO2 23  GLUCOSE 101*  BUN 32*  CREATININE 1.65*  CALCIUM 8.8    Recent Labs  02/14/13 2110  AST 126*  ALT 44  ALKPHOS 95  BILITOT 0.4  PROT 6.6  ALBUMIN 3.3*   No results found for this basename: LIPASE, AMYLASE,  in the last 72 hours  Recent Labs  02/13/13 1033 02/14/13 2110  WBC 13.5* 9.3  NEUTROABS  --  8.3*  HGB 10.5* 12.0*  HCT 34.3* 35.3*  MCV 99.2* 93.1  PLT  --  145*   No results found for this basename: CKTOTAL, CKMB, CKMBINDEX, TROPONINI,  in the last 72 hours No results found for this basename: TSH, T4TOTAL, FREET3, T3FREE, THYROIDAB,  in the last 72 hours No results found for this basename: VITAMINB12, FOLATE, FERRITIN, TIBC, IRON, RETICCTPCT,  in the last 72 hours No results found for this basename: HGBA1C    The CrCl is unknown because both a height and weight (above a minimum accepted value) are required for this calculation.  UA increased hg in urine no RBC, protein 100   Cultures:    Component Value Date/Time   SDES SYNOVIAL RT KNEE 03/25/2011 1110   SDES SYNOVIAL RT KNEE 03/25/2011 1110   SDES SYNOVIAL RT KNEE 03/25/2011 1110   SPECREQUEST NONE 03/25/2011 1110   SPECREQUEST NONE 03/25/2011 1110   SPECREQUEST NONE 03/25/2011 1110   CULT NO  GROWTH 3 DAYS 03/25/2011 1110   CULT NO ANAEROBES ISOLATED 03/25/2011 1110   REPTSTATUS 03/25/2011 FINAL 03/25/2011 1110   REPTSTATUS 03/28/2011 FINAL 03/25/2011 1110   REPTSTATUS 03/30/2011 FINAL 03/25/2011 1110       Radiological Exams on  Admission: Dg Chest 2 View  02/13/2013   CLINICAL DATA:  Chest pain.  EXAM: CHEST  2 VIEW  COMPARISON:  PA and lateral chest 12/01/2012 and 06/20/2012.  FINDINGS: Lungs are clear. Heart size is normal. No pneumothorax or pleural effusion. Degenerative change about the shoulders noted.  IMPRESSION: No acute disease.   Electronically Signed   By: Drusilla Kanner M.D.   On: 02/13/2013 14:34   Dg Chest Port 1 View  02/14/2013   CLINICAL DATA:  Cough and fever.  EXAM: PORTABLE CHEST - 1 VIEW  COMPARISON:  02/13/2013  FINDINGS: Shallow inspiration with mild elevation of the left hemidiaphragm. Normal heart size and pulmonary vascularity. Developing perihilar infiltration on the left may represent early pneumonia. No pneumothorax. No pleural effusions. Degenerative changes in the shoulders. Interval resection or resorption of the distal clavicles.  IMPRESSION: Shallow inspiration. Developing patchy infiltration in the left perihilar region may represent early pneumonia.   Electronically Signed   By: Burman Nieves M.D.   On: 02/14/2013 21:10    Chart has been reviewed  Assessment/Plan  72 yo with history of hypertension coronary disease presents with fever was found to have pneumonia on chest x-ray but also family has been ill with flulike illness.  Present on Admission:  . CAP (community acquired pneumonia) - treat with Rocephin and azithromycin. Oxygen as needed. Pulmonary toilet. Tests for influenza but will not initiate Tamiflu the patient is 72 hours out from start of his illness, obtain sputum and blood culture  . HYPERTENSION - continue medications watch for signs of hypotension . Acute renal failure - patient appears to be somewhat dehydrated endorses poor by mouth intake. Will give gentle IV fluids full creatinine. Given presence of hemoglobin but absence of red blood cells obtain total CK levels. If no improvement in creatinine we'll obtain renal ultrasound.    Prophylaxis:  Lovenox,  Protonix  CODE STATUS: FULL CODE  Other plan as per orders.  I have spent a total of 55 min on this admission  Rahi Chandonnet 02/14/2013, 11:17 PM

## 2013-02-14 NOTE — ED Notes (Signed)
Accidentally clicked "Review Goal of Therapy for Sepsis".  Called Charge Nurse immediately to get her to re-enter the order.  Thanks

## 2013-02-14 NOTE — ED Notes (Signed)
Bed: WA21 Expected date: 02/14/13 Expected time: 7:37 PM Means of arrival: Ambulance Comments: pneumonia

## 2013-02-15 ENCOUNTER — Encounter (HOSPITAL_COMMUNITY): Payer: Self-pay | Admitting: *Deleted

## 2013-02-15 LAB — COMPREHENSIVE METABOLIC PANEL
Alkaline Phosphatase: 90 U/L (ref 39–117)
BUN: 28 mg/dL — ABNORMAL HIGH (ref 6–23)
CO2: 21 mEq/L (ref 19–32)
Calcium: 8.5 mg/dL (ref 8.4–10.5)
Creatinine, Ser: 1.43 mg/dL — ABNORMAL HIGH (ref 0.50–1.35)
GFR calc Af Amer: 54 mL/min — ABNORMAL LOW (ref >90)
GFR calc non Af Amer: 47 mL/min — ABNORMAL LOW (ref >90)
Glucose, Bld: 168 mg/dL — ABNORMAL HIGH (ref 70–99)
Total Protein: 6.6 g/dL (ref 6.0–8.3)

## 2013-02-15 LAB — CK: Total CK: 3015 U/L — ABNORMAL HIGH (ref 7–232)

## 2013-02-15 LAB — LEGIONELLA ANTIGEN, URINE: Legionella Antigen, Urine: NEGATIVE

## 2013-02-15 LAB — INFLUENZA PANEL BY PCR (TYPE A & B)
H1N1 flu by pcr: NOT DETECTED
Influenza A By PCR: POSITIVE — AB

## 2013-02-15 LAB — MAGNESIUM: Magnesium: 1.8 mg/dL (ref 1.5–2.5)

## 2013-02-15 LAB — PHOSPHORUS: Phosphorus: 3.5 mg/dL (ref 2.3–4.6)

## 2013-02-15 MED ORDER — PINDOLOL 5 MG PO TABS
5.0000 mg | ORAL_TABLET | Freq: Two times a day (BID) | ORAL | Status: DC
  Administered 2013-02-15: 5 mg via ORAL
  Filled 2013-02-15 (×2): qty 1

## 2013-02-15 MED ORDER — TRAMADOL HCL 50 MG PO TABS
50.0000 mg | ORAL_TABLET | Freq: Once | ORAL | Status: AC
  Administered 2013-02-15: 50 mg via ORAL

## 2013-02-15 MED ORDER — GABAPENTIN 400 MG PO CAPS: 400.0000 mg | ORAL_CAPSULE | Freq: Three times a day (TID) | ORAL | Status: DC

## 2013-02-15 MED ORDER — PINDOLOL 5 MG PO TABS
5.0000 mg | ORAL_TABLET | Freq: Two times a day (BID) | ORAL | Status: DC
  Administered 2013-02-15 – 2013-02-16 (×2): 5 mg via ORAL
  Filled 2013-02-15 (×3): qty 1

## 2013-02-15 MED ORDER — GUAIFENESIN ER 600 MG PO TB12
600.0000 mg | ORAL_TABLET | Freq: Two times a day (BID) | ORAL | Status: DC
  Administered 2013-02-15 – 2013-02-16 (×4): 600 mg via ORAL
  Filled 2013-02-15 (×5): qty 1

## 2013-02-15 MED ORDER — TRAMADOL HCL 50 MG PO TABS
ORAL_TABLET | ORAL | Status: AC
  Filled 2013-02-15: qty 1

## 2013-02-15 MED ORDER — GABAPENTIN 400 MG PO CAPS
400.0000 mg | ORAL_CAPSULE | Freq: Three times a day (TID) | ORAL | Status: DC
  Administered 2013-02-15 – 2013-02-16 (×5): 400 mg via ORAL
  Filled 2013-02-15 (×7): qty 1

## 2013-02-15 MED ORDER — PANTOPRAZOLE SODIUM 40 MG PO TBEC
40.0000 mg | DELAYED_RELEASE_TABLET | Freq: Every day | ORAL | Status: DC
  Administered 2013-02-15 – 2013-02-16 (×2): 40 mg via ORAL
  Filled 2013-02-15 (×2): qty 1

## 2013-02-15 MED ORDER — ALBUTEROL SULFATE (2.5 MG/3ML) 0.083% IN NEBU
10.0000 mg | INHALATION_SOLUTION | RESPIRATORY_TRACT | Status: DC | PRN
  Filled 2013-02-15: qty 12

## 2013-02-15 MED ORDER — ENOXAPARIN SODIUM 40 MG/0.4ML ~~LOC~~ SOLN: 40.0000 mg | SUBCUTANEOUS | Status: DC

## 2013-02-15 MED ORDER — DABIGATRAN ETEXILATE MESYLATE 150 MG PO CAPS
150.0000 mg | ORAL_CAPSULE | Freq: Two times a day (BID) | ORAL | Status: DC
Start: 2013-02-15 — End: 2013-02-16
  Administered 2013-02-15 – 2013-02-16 (×3): 150 mg via ORAL
  Filled 2013-02-15 (×4): qty 1

## 2013-02-15 MED ORDER — SODIUM CHLORIDE 0.9 % IV SOLN
INTRAVENOUS | Status: AC
  Administered 2013-02-15 (×2): via INTRAVENOUS

## 2013-02-15 MED ORDER — IPRATROPIUM BROMIDE 0.02 % IN SOLN
0.5000 mg | Freq: Four times a day (QID) | RESPIRATORY_TRACT | Status: DC
Start: 2013-02-15 — End: 2013-02-16
  Administered 2013-02-15 – 2013-02-16 (×5): 0.5 mg via RESPIRATORY_TRACT
  Filled 2013-02-15 (×5): qty 2.5

## 2013-02-15 MED ORDER — METOCLOPRAMIDE HCL 10 MG PO TABS
10.0000 mg | ORAL_TABLET | Freq: Two times a day (BID) | ORAL | Status: DC
  Administered 2013-02-15 – 2013-02-16 (×3): 10 mg via ORAL
  Filled 2013-02-15 (×4): qty 1

## 2013-02-15 MED ORDER — SIMVASTATIN 40 MG PO TABS
40.0000 mg | ORAL_TABLET | Freq: Every day | ORAL | Status: DC
Start: 2013-02-15 — End: 2013-02-16
  Administered 2013-02-15 (×2): 40 mg via ORAL
  Filled 2013-02-15 (×3): qty 1

## 2013-02-15 MED ORDER — MORPHINE SULFATE ER 30 MG PO TBCR
30.0000 mg | EXTENDED_RELEASE_TABLET | Freq: Three times a day (TID) | ORAL | Status: DC
  Administered 2013-02-15 – 2013-02-16 (×5): 30 mg via ORAL
  Filled 2013-02-15 (×5): qty 1

## 2013-02-15 MED ORDER — MORPHINE SULFATE ER 30 MG PO TBCR: 30.0000 mg | EXTENDED_RELEASE_TABLET | Freq: Three times a day (TID) | ORAL | Status: DC

## 2013-02-15 MED ORDER — ALPRAZOLAM 0.25 MG PO TABS
0.2500 mg | ORAL_TABLET | Freq: Every day | ORAL | Status: DC | PRN
  Administered 2013-02-15 – 2013-02-16 (×2): 0.25 mg via ORAL
  Filled 2013-02-15 (×2): qty 1

## 2013-02-15 NOTE — Progress Notes (Signed)
Notified by telemetry monitor tech, Kaydra, patient's heart rate is dropping into 40s, but is not sustaining.  Lowest heart rate drop is to 39 bpm and did not sustain.  Patient asymptomatic.  Vital signs are stable. Heart rate is maintaining between 60-70 bpm.  EKG shows first degree AV block with PVCs.  Dr. Blake Divine notified.  Dr. Blake Divine is going to discontinue patient's beta blocker for now.  Will continue to monitor patient.

## 2013-02-15 NOTE — Care Management Note (Addendum)
    Page 1 of 1   02/16/2013     3:53:49 PM   CARE MANAGEMENT NOTE 02/16/2013  Patient:  [name], [name]   Account Number:  [id number]  Date Initiated:  02/15/2013  Documentation initiated by:  Lanier Clam  Subjective/Objective Assessment:   72 Y/O M ADMITTED W/CONFUSION,FEVER.     Action/Plan:   FROM HOME W/SPOUSE.   Anticipated DC Date:  02/16/2013   Anticipated DC Plan:  HOME/SELF CARE      DC Planning Services  CM consult      Choice offered to / List presented to:             Status of service:  Completed, signed off Medicare Important Message given?   (If response is "NO", the following Medicare IM given date fields will be blank) Date Medicare IM given:   Date Additional Medicare IM given:    Discharge Disposition:  HOME/SELF CARE  Per UR Regulation:  Reviewed for med. necessity/level of care/duration of stay  If discussed at Long Length of Stay Meetings, dates discussed:    Comments:  02/16/13 Francisca Langenderfer RN,BSN NCM [phone] NO D/C NEEDS OR ORDERS.  02/15/13 Kemonte Ullman RN,BSN NCM [phone]

## 2013-02-15 NOTE — Progress Notes (Signed)
[facility] PROGRESS NOTE  [name] AVW:[id number] DOB: 01/10/1941 DOA: 02/14/2013 PCP: Carrie Mew, MD  Assessment/Plan: CAP (community acquired pneumonia) - treat with Rocephin and azithromycin. Oxygen as needed. Pulmonary toilet. Tests for influenza but will not initiate Tamiflu the patient is 72 hours out from start of his illness, obtain sputum and blood culture  . HYPERTENSION - continue medications watch for signs of hypotension . Acute renal failure - patient appears to be somewhat dehydrated endorses poor by mouth intake. Will give gentle IV fluids full creatinine. Improvement in the renal parameters on hydration.  DVT prophylaxis.  Bradycardia: - asymptomatic, will d/c pindolol if persistent.   Code Status: full code Family Communication: none at bedside Disposition Plan: remain inpatient   Consultants:  none  Procedures:  none  Antibiotics:  Rocephin 12/29  zithromax 12/29  HPI/Subjective: Comfortable, no new complaints.   Objective: Filed Vitals:   02/15/13 1335  BP: 130/91  Pulse: 59  Temp: 97.5 F (36.4 C)  Resp:     Intake/Output Summary (Last 24 hours) at 02/15/13 1829 Last data filed at 02/15/13 1700  Gross per 24 hour  Intake 1488.33 ml  Output   2655 ml  Net -1166.67 ml   Filed Weights   02/15/13 0051  Weight: 88 kg (194 lb 0.1 oz)    Exam:   General:  Alert afebrile comfortable  Cardiovascular: s1s2  Respiratory: ctab, scattered rhonchi heard.   Abdomen: soft NT ND BS+  Musculoskeletal: no pedal edema  Data Reviewed: Basic Metabolic Panel:  Recent Labs Lab 02/14/13 2110 02/15/13 0118  NA 135 132*  K 4.1 4.1  CL 98 98  CO2 23 21  GLUCOSE 101* 168*  BUN 32* 28*  CREATININE 1.65* 1.43*  CALCIUM 8.8 8.5  MG  --  1.8  PHOS  --  3.5   Liver Function Tests:  Recent Labs Lab 02/14/13 2110 02/15/13 0118  AST 126* 125*  ALT 44 43  ALKPHOS 95 90  BILITOT 0.4 0.3  PROT 6.6 6.6  ALBUMIN 3.3*  3.1*   No results found for this basename: LIPASE, AMYLASE,  in the last 168 hours No results found for this basename: AMMONIA,  in the last 168 hours CBC:  Recent Labs Lab 02/13/13 1033 02/14/13 2110  WBC 13.5* 9.3  NEUTROABS  --  8.3*  HGB 10.5* 12.0*  HCT 34.3* 35.3*  MCV 99.2* 93.1  PLT  --  145*   Cardiac Enzymes:  Recent Labs Lab 02/15/13 0118  CKTOTAL 3015*   BNP (last 3 results)  Recent Labs  12/01/12 1900  PROBNP 1124.0*   CBG: No results found for this basename: GLUCAP,  in the last 168 hours  No results found for this or any previous visit (from the past 240 hour(s)).   Studies: Dg Chest Port 1 View  02/14/2013   CLINICAL DATA:  Cough and fever.  EXAM: PORTABLE CHEST - 1 VIEW  COMPARISON:  02/13/2013  FINDINGS: Shallow inspiration with mild elevation of the left hemidiaphragm. Normal heart size and pulmonary vascularity. Developing perihilar infiltration on the left may represent early pneumonia. No pneumothorax. No pleural effusions. Degenerative changes in the shoulders. Interval resection or resorption of the distal clavicles.  IMPRESSION: Shallow inspiration. Developing patchy infiltration in the left perihilar region may represent early pneumonia.   Electronically Signed   By: Burman Nieves M.D.   On: 02/14/2013 21:10    Scheduled Meds: . sodium chloride  1,000 mL Intravenous Once   Followed  by  . sodium chloride  1,000 mL Intravenous Once  . azithromycin  500 mg Intravenous Q24H  . cefTRIAXone (ROCEPHIN)  IV  1 g Intravenous Q24H  . dabigatran  150 mg Oral BID  . gabapentin  400 mg Oral TID  . guaiFENesin  600 mg Oral BID  . ipratropium  0.5 mg Nebulization Q6H  . metoCLOPramide  10 mg Oral BID  . morphine  30 mg Oral TID  . pantoprazole  40 mg Oral Daily  . pindolol  5 mg Oral BID  . simvastatin  40 mg Oral QHS   Continuous Infusions:   Active Problems:   HYPERTENSION   Community acquired pneumonia   CAP (community acquired  pneumonia)   Acute renal failure    Time spent: 25 min    [name]  [facility] Pager [phone]. If 7PM-7AM, please contact night-coverage at [email or web address], password [facility] 02/15/2013, 6:29 PM  LOS: 1 day

## 2013-02-15 NOTE — ED Notes (Signed)
Unable to find "Review goals of therapy for Sepsis" in order set. RN aware.

## 2013-02-16 DIAGNOSIS — N179 Acute kidney failure, unspecified: Secondary | ICD-10-CM

## 2013-02-16 DIAGNOSIS — J189 Pneumonia, unspecified organism: Secondary | ICD-10-CM

## 2013-02-16 LAB — BASIC METABOLIC PANEL
CO2: 26 mEq/L (ref 19–32)
Calcium: 8.8 mg/dL (ref 8.4–10.5)
Chloride: 102 mEq/L (ref 96–112)
Creatinine, Ser: 1.3 mg/dL (ref 0.50–1.35)
GFR calc Af Amer: 61 mL/min — ABNORMAL LOW (ref >90)
GFR calc non Af Amer: 52 mL/min — ABNORMAL LOW (ref >90)
Potassium: 4.2 mEq/L (ref 3.7–5.3)
Sodium: 138 mEq/L (ref 137–147)

## 2013-02-16 LAB — URINE CULTURE: Colony Count: NO GROWTH

## 2013-02-16 MED ORDER — ALBUTEROL SULFATE (2.5 MG/3ML) 0.083% IN NEBU: 2.5000 mg | INHALATION_SOLUTION | Freq: Three times a day (TID) | RESPIRATORY_TRACT | Status: DC

## 2013-02-16 MED ORDER — IPRATROPIUM-ALBUTEROL 0.5-2.5 (3) MG/3ML IN SOLN: 3.0000 mL | Freq: Three times a day (TID) | RESPIRATORY_TRACT | Status: DC

## 2013-02-16 MED ORDER — IPRATROPIUM BROMIDE 0.02 % IN SOLN: 0.5000 mg | Freq: Three times a day (TID) | RESPIRATORY_TRACT | Status: DC

## 2013-02-16 MED ORDER — LEVOFLOXACIN 500 MG PO TABS: 500.0000 mg | ORAL_TABLET | Freq: Every day | ORAL | Status: DC

## 2013-02-16 NOTE — Progress Notes (Signed)
Received report from previous nurse, agree with her assessment and will continue to monitor.

## 2013-02-16 NOTE — Evaluation (Signed)
Physical Therapy Evaluation Patient Details Name: [name] MRN: [id number] DOB: February 04, 1941 Today's Date: 02/16/2013 Time: 1350-1416 PT Time Calculation (min): 26 min  PT Assessment / Plan / Recommendation History of Present Illness  Flu symptoms +  Clinical Impression  Pt ambulated well with RW. Pt does have H/O bil foot drop due to neuropathy.p t reports falls. Pt has bil. Foot orthoses. Pt 's sats 94% on RA. No furthur PT indicated at this time. Plans DC today.    PT Assessment  Patent does not need any further PT services    Follow Up Recommendations  No PT follow up    Does the patient have the potential to tolerate intense rehabilitation      Barriers to Discharge        Equipment Recommendations  None recommended by PT    Recommendations for Other Services     Frequency      Precautions / Restrictions Precautions Precautions: Fall Precaution Comments: droplet   Pertinent Vitals/Pain       Mobility  Bed Mobility Bed Mobility: Supine to Sit Supine to Sit: 7: Independent Transfers Transfers: Sit to Stand;Stand to Sit Sit to Stand: 5: Supervision Stand to Sit: 5: Supervision Ambulation/Gait Ambulation/Gait Assistance: 5: Supervision Ambulation Distance (Feet): 125 Feet Assistive device: Rolling walker Gait Pattern: Right steppage;Left steppage;Step-through pattern;Decreased step length - right;Decreased step length - left    Exercises     PT Diagnosis:    PT Problem List:   PT Treatment Interventions:       PT Goals(Current goals can be found in the care plan section)    Visit Information  Last PT Received On: 02/16/13 Assistance Needed: +1 History of Present Illness: Flu symptoms +       Prior Functioning  Home Living Family/patient expects to be discharged to:: Private residence Living Arrangements: Spouse/significant other Available Help at Discharge: Family Type of Home: House Home Layout: Two level;Able to live on main level  with bedroom/bathroom Home Equipment: Shower seat;Walker - 2 wheels;Bedside commode Prior Function Level of Independence: Independent with assistive device(s) Communication Communication: No difficulties    Cognition  Cognition Arousal/Alertness: Awake/alert Behavior During Therapy: WFL for tasks assessed/performed Overall Cognitive Status: Within Functional Limits for tasks assessed    Extremity/Trunk Assessment Upper Extremity Assessment Upper Extremity Assessment: Overall WFL for tasks assessed Lower Extremity Assessment Lower Extremity Assessment: RLE deficits/detail;LLE deficits/detail RLE Sensation: history of peripheral neuropathy LLE Sensation: history of peripheral neuropathy Cervical / Trunk Assessment Cervical / Trunk Assessment: Normal   Balance    End of Session PT - End of Session Activity Tolerance: Patient tolerated treatment well Patient left: in chair;with call bell/phone within reach;with family/visitor present Nurse Communication: Mobility status  GP     Rada Hay 02/16/2013, 2:21 PM

## 2013-02-16 NOTE — Discharge Summary (Signed)
Physician Discharge Summary  [name] ZOX:[id number] DOB: 26-Nov-1940 DOA: 02/14/2013  PCP: Carrie Mew, MD  Admit date: 02/14/2013 Discharge date: 02/16/2013  Time spent: 30 minutes  Recommendations for Outpatient Follow-up:  1. Follow up with PCP in one week 2. Follow up with BMP IN 1 to 2 weeks.    Discharge Diagnoses:  Active Problems:   HYPERTENSION   Community acquired pneumonia   CAP (community acquired pneumonia)   Acute renal failure   Discharge Condition: improved.   Diet recommendation: low sodium diet  Filed Weights   02/15/13 0051  Weight: 88 kg (194 lb 0.1 oz)    History of present illness:  [name] is a 72 y.o. male Presented with for the past 2-3 days he was not feeling well. Yesterday he had a fever up to 103 and went to urgent care where he was diagnosed with PNA and started on ceftindir. He continued to feel poorly and presented to ER where diagnosis of PNA was confirmed but he was also noted to be hypoxic. Patient reports minimal coughing. He has not been drinking much fluids. Patient have had an other bout of pneumonia about 8 weeks ago treated as an outpatient. He is not on home oxygen. Pateint has hx of chronic back pain. The family have had a cold. He and his wife have had a flu shot but still developed symptoms. Hospitalist was called for an admission    Hospital Course:  CAP (community acquired pneumonia) - treated with Rocephin and azithromycin.  Pulmonary toilet. Tests for influenza but will not initiate Tamiflu the patient is 72 hours out from start of his illness, . Blood cultures negative so far. Discharged on levaquin.   Hypertension:   Controlled.    Acute renal failure - patient appears to be somewhat dehydrated endorses poor by mouth intake. Will give gentle IV fluids full creatinine. Improvement in the renal parameters on hydration. Holding ace inhibitor in view of his renal failure.    Bradycardia:  - asymptomatic,  resolved.    Procedures:  none  Consultations:  none  Discharge Exam: Filed Vitals:   02/16/13 1110  BP: 132/75  Pulse: 63  Temp: 99.7 F (37.6 C)  Resp: 20    General: Alert afebrile comfortable  Cardiovascular: s1s2  Respiratory: ctab, no wheezing. Abdomen: soft NT ND BS+ usculoskeletal: no pedal edema   Discharge Instructions  Discharge Orders   Future Appointments Provider Department Dept Phone   03/12/2013 10:30 AM [facility] [phone]   03/12/2013 10:45 AM [facility] Colace, [facility] [phone]   03/15/2013 10:00 AM [facility] [phone]   07/13/2013 9:45 AM Waymon Budge, MD [facility] [phone]   Future Orders Complete By Expires   Diet - low sodium heart healthy  As directed    Discharge instructions  As directed    Comments:     Follow up with PCP in one week We stopped the lisinopril as you were in renal failure. Please follow up with PCP and restart the medication in 1 to 2 weeks.  Follow up with BMP in one week to check your electrolytes and renal parameters.       Medication List    STOP taking these medications       cefdinir 300 MG capsule  Commonly known as:  OMNICEF     lisinopril 20 MG tablet  Commonly known  as:  PRINIVIL,ZESTRIL      TAKE these medications       gabapentin 400 MG capsule  Commonly known as:  NEURONTIN  Take 1 capsule (400 mg total) by mouth 3 (three) times daily.     levofloxacin 500 MG tablet  Commonly known as:  LEVAQUIN  Take 1 tablet (500 mg total) by mouth daily.     metoCLOPramide 10 MG tablet  Commonly known as:  REGLAN  Take 10 mg by mouth 2 (two) times daily.     morphine 30 MG 12 hr tablet  Commonly known as:  MS CONTIN  Take 1 tablet (30 mg total) by mouth 3 (three) times daily. Must last 30 days     NITROSTAT 0.4 MG SL tablet  Generic  drug:  nitroGLYCERIN  Place 0.4 mg under the tongue every 5 (five) minutes as needed for chest pain. q 5 minutes x 3     omeprazole 20 MG capsule  Commonly known as:  PRILOSEC  Take 1 capsule (20 mg total) by mouth daily.     pindolol 10 MG tablet  Commonly known as:  VISKEN  Take 0.5 tablets by mouth 2 (two) times daily.     PRADAXA 150 MG Caps capsule  Generic drug:  dabigatran  Take 150 mg by mouth 2 (two) times daily.     simvastatin 40 MG tablet  Commonly known as:  ZOCOR  Take 40 mg by mouth at bedtime.     sodium-potassium bicarbonate Tbef dissolvable tablet  Commonly known as:  ALKA-SELTZER GOLD  Take 2 tablets by mouth daily as needed (for indigestion).       Allergies  Allergen Reactions  . Acetaminophen Other (See Comments)    LIVER INFLAMMATION IN HIGH DOSES       Follow-up Information   Follow up with Carrie Mew, MD. Schedule an appointment as soon as possible for a visit in 1 week. (please check bmp )    Specialty:  Internal Medicine   Contact information:   [address] [place] [address] [phone]        The results of significant diagnostics from this hospitalization (including imaging, microbiology, ancillary and laboratory) are listed below for reference.    Significant Diagnostic Studies: Dg Chest 2 View  02/13/2013   CLINICAL DATA:  Chest pain.  EXAM: CHEST  2 VIEW  COMPARISON:  PA and lateral chest 12/01/2012 and 06/20/2012.  FINDINGS: Lungs are clear. Heart size is normal. No pneumothorax or pleural effusion. Degenerative change about the shoulders noted.  IMPRESSION: No acute disease.   Electronically Signed   By: Drusilla Kanner M.D.   On: 02/13/2013 14:34   Dg Chest Port 1 View  02/14/2013   CLINICAL DATA:  Cough and fever.  EXAM: PORTABLE CHEST - 1 VIEW  COMPARISON:  02/13/2013  FINDINGS: Shallow inspiration with mild elevation of the left hemidiaphragm. Normal heart size and pulmonary vascularity. Developing  perihilar infiltration on the left may represent early pneumonia. No pneumothorax. No pleural effusions. Degenerative changes in the shoulders. Interval resection or resorption of the distal clavicles.  IMPRESSION: Shallow inspiration. Developing patchy infiltration in the left perihilar region may represent early pneumonia.   Electronically Signed   By: Burman Nieves M.D.   On: 02/14/2013 21:10    Microbiology: Recent Results (from the past 240 hour(s))  CULTURE, BLOOD (ROUTINE X 2)     Status: None   Collection Time    02/14/13  9:10 PM  Result Value Range Status   Specimen Description BLOOD RIGHT ANTECUBITAL   Final   Special Requests BOTTLES DRAWN AEROBIC AND ANAEROBIC 5CC   Final   Culture  Setup Time     Final   Value: 02/15/2013 00:58     Performed at [facility]   Culture     Final   Value:        BLOOD CULTURE RECEIVED NO GROWTH TO DATE CULTURE WILL BE HELD FOR 5 DAYS BEFORE ISSUING A FINAL NEGATIVE REPORT     Performed at [facility]   Report Status PENDING   Incomplete  CULTURE, BLOOD (ROUTINE X 2)     Status: None   Collection Time    02/14/13  9:17 PM      Result Value Range Status   Specimen Description BLOOD LEFT ANTECUBITAL   Final   Special Requests BOTTLES DRAWN AEROBIC AND ANAEROBIC 5CC   Final   Culture  Setup Time     Final   Value: 02/15/2013 00:59     Performed at [facility]   Culture     Final   Value:        BLOOD CULTURE RECEIVED NO GROWTH TO DATE CULTURE WILL BE HELD FOR 5 DAYS BEFORE ISSUING A FINAL NEGATIVE REPORT     Performed at [facility]   Report Status PENDING   Incomplete  URINE CULTURE     Status: None   Collection Time    02/14/13  9:51 PM      Result Value Range Status   Specimen Description URINE, CLEAN CATCH   Final   Special Requests NONE   Final   Culture  Setup Time     Final   Value: 02/15/2013 03:04     Performed at [facility] Count     Final   Value: NO GROWTH      Performed at [facility]   Culture     Final   Value: NO GROWTH     Performed at [facility]   Report Status 02/16/2013 FINAL   Final     Labs: Basic Metabolic Panel:  Recent Labs Lab 02/14/13 2110 02/15/13 0118 02/16/13 0509  NA 135 132* 138  K 4.1 4.1 4.2  CL 98 98 102  CO2 23 21 26   GLUCOSE 101* 168* 96  BUN 32* 28* 25*  CREATININE 1.65* 1.43* 1.30  CALCIUM 8.8 8.5 8.8  MG  --  1.8  --   PHOS  --  3.5  --    Liver Function Tests:  Recent Labs Lab 02/14/13 2110 02/15/13 0118  AST 126* 125*  ALT 44 43  ALKPHOS 95 90  BILITOT 0.4 0.3  PROT 6.6 6.6  ALBUMIN 3.3* 3.1*   No results found for this basename: LIPASE, AMYLASE,  in the last 168 hours No results found for this basename: AMMONIA,  in the last 168 hours CBC:  Recent Labs Lab 02/13/13 1033 02/14/13 2110  WBC 13.5* 9.3  NEUTROABS  --  8.3*  HGB 10.5* 12.0*  HCT 34.3* 35.3*  MCV 99.2* 93.1  PLT  --  145*   Cardiac Enzymes:  Recent Labs Lab 02/15/13 0118  CKTOTAL 3015*   BNP: BNP (last 3 results)  Recent Labs  12/01/12 1900  PROBNP 1124.0*   CBG: No results found for this basename: GLUCAP,  in the last 168 hours     Signed:  Sabrin Dunlevy  [facility] 02/16/2013, 2:56 PM

## 2013-02-19 ENCOUNTER — Telehealth: Payer: Self-pay | Admitting: Internal Medicine

## 2013-02-19 ENCOUNTER — Ambulatory Visit (HOSPITAL_COMMUNITY)
Admission: RE | Admit: 2013-02-19 | Discharge: 2013-02-19 | Disposition: A | Discharge status: Home / Self Care | Payer: Medicare Other | Source: Ambulatory Visit | Attending: Internal Medicine | Admitting: Internal Medicine

## 2013-02-19 ENCOUNTER — Other Ambulatory Visit: Payer: Self-pay | Admitting: *Deleted

## 2013-02-19 DIAGNOSIS — R918 Other nonspecific abnormal finding of lung field: Secondary | ICD-10-CM | POA: Insufficient documentation

## 2013-02-19 DIAGNOSIS — J189 Pneumonia, unspecified organism: Secondary | ICD-10-CM

## 2013-02-19 DIAGNOSIS — R0989 Other specified symptoms and signs involving the circulatory and respiratory systems: Secondary | ICD-10-CM | POA: Insufficient documentation

## 2013-02-19 DIAGNOSIS — I771 Stricture of artery: Secondary | ICD-10-CM | POA: Insufficient documentation

## 2013-02-19 DIAGNOSIS — R0602 Shortness of breath: Secondary | ICD-10-CM | POA: Insufficient documentation

## 2013-02-19 DIAGNOSIS — M47817 Spondylosis without myelopathy or radiculopathy, lumbosacral region: Secondary | ICD-10-CM | POA: Insufficient documentation

## 2013-02-19 MED ORDER — LEVOFLOXACIN 750 MG PO TABS: 750.0000 mg | ORAL_TABLET | Freq: Every day | ORAL | Status: DC

## 2013-02-19 NOTE — Telephone Encounter (Signed)
Patient Information:  Caller Name: [place]  Phone: [phone]  Patient: [name], [name]  Gender: Male  DOB: 03-05-1940  Age: 73 Years  PCP: Benay Pillow (Adults only)  Office Follow Up:  Does the office need to follow up with this patient?: No  Instructions For The Office: N/A  RN Note:  Patient calling about shortness of breath.  Onset a week ago, but states has gotten worse.  Patient states he was told by office staff to go to hospital to have chest xray 02/19/13 AM, but states he cannot go to hospital "because I am too short of breath."  Per breathing difficulty protocol, to go to ED due to moderate difficulty breathing with SOB even at rest.  States has someone who can take him to ED; info to office.  krs/can  Symptoms  Reason For Call & Symptoms: shortness of breath  Reviewed Health History In EMR: Yes  Reviewed Medications In EMR: Yes  Reviewed Allergies In EMR: Yes  Reviewed Surgeries / Procedures: Yes  Date of Onset of Symptoms: 02/12/2013  Guideline(s) Used:  Breathing Difficulty  Disposition Per Guideline:   Go to ED Now  Reason For Disposition Reached:   Moderate difficulty breathing (e.g., speaks in phrases, SOB even at rest, pulse 100-120) of new onset or worse than normal  Advice Given:  N/A  Patient Will Follow Care Advice:  YES

## 2013-02-19 NOTE — Telephone Encounter (Signed)
cxr show improving infiltrate- pt breathing has improved per pt and per dr Arnoldo Morale- levaquin 750 qd for 7 days -pt's wife informed

## 2013-02-19 NOTE — Telephone Encounter (Signed)
Per dr Nelda Bucks to [facility] long for cxr and have him stay there until we get results. Pt states he did not take levaquin, but then i questioned wife and she found bottle with 4 levaquin ordered, but they were all gone.  sheould be atleast one left.  So he has taken them all in 3 days.

## 2013-02-19 NOTE — Telephone Encounter (Signed)
[place]/Patient [phone]; Emergent call: Just discharged, trouble breathing.  No answer at time of call back.

## 2013-02-21 LAB — CULTURE, BLOOD (ROUTINE X 2)
Culture: NO GROWTH
Culture: NO GROWTH

## 2013-02-22 ENCOUNTER — Telehealth: Payer: Self-pay | Admitting: Internal Medicine

## 2013-02-22 NOTE — Telephone Encounter (Signed)
Nothing avaiable- does padonda have anything?

## 2013-02-22 NOTE — Telephone Encounter (Signed)
Pt discharged from hospital 02/16/13 requesting post hospital follow up with PCP within a week.

## 2013-02-24 ENCOUNTER — Telehealth: Payer: Self-pay | Admitting: Internal Medicine

## 2013-02-24 NOTE — Telephone Encounter (Signed)
Patient Information:  Caller Name: Fritz  Phone: [phone]  Patient: [name], [name]  Gender: Male  DOB: [birth year]  Age: 73 Years  PCP: Benay Pillow (Adults only)  Office Follow Up:  Does the office need to follow up with this patient?: Yes  Instructions For The Office: ER Disposition - spoke with Curt Bears in office.   Provider covering for Dr Arnoldo Morale to advise and call pt back with further instructions.   Symptoms  Reason For Call & Symptoms: Pt was in hospital with pneumonia about 1.5 weeks ago.  Pt was feeling completely better  02/24/13  awoke today with chest congestion, nonproductive cough, and difficulty breathing.  The breathing has improved since awakening but continues to have shortness of breath, afebrile.   Pt took an antibiotic pill? but does not know the name and can't find bottle.  Eating/drinking well, voiding well.   Spoke with Curt Bears in office due to ER disposition.  Advised pt a message would be sent to the provider covering for Dr Arnoldo Morale today regarding his sxs and he would receive a phone call with further instructions, pt agreed and is to callback with any worsening sxs beforehand.  Reviewed Health History In EMR: Yes  Reviewed Medications In EMR: Yes  Reviewed Allergies In EMR: Yes  Reviewed Surgeries / Procedures: Yes  Date of Onset of Symptoms: 02/24/2013  Treatments Tried: unknown abx  Treatments Tried Worked: No  Guideline(s) Used:  Cough  Disposition Per Guideline:   Go to ED Now  Reason For Disposition Reached:   Difficulty breathing  Advice Given:  N/A  RN Overrode Recommendation:  Document Patient  Office to follow up with pt with further instructions per Curt Bears.

## 2013-02-24 NOTE — Telephone Encounter (Signed)
Called patient and as before on 1/2 he feels better with no SOB-- talked without SOB today. He states one he got up and relaxed he felt fine- instructed to go to ed if he feels SOB.  He should have 2 levaquin remaining and he was encouraged to complete it.

## 2013-02-25 ENCOUNTER — Encounter: Payer: Self-pay | Admitting: Family Medicine

## 2013-02-25 ENCOUNTER — Ambulatory Visit (INDEPENDENT_AMBULATORY_CARE_PROVIDER_SITE_OTHER): Payer: Medicare Other | Admitting: Family Medicine

## 2013-02-25 VITALS — BP 128/82 | HR 83 | Temp 98.2°F | Ht 68.0 in | Wt 188.4 lb

## 2013-02-25 DIAGNOSIS — J189 Pneumonia, unspecified organism: Secondary | ICD-10-CM

## 2013-02-25 DIAGNOSIS — I10 Essential (primary) hypertension: Secondary | ICD-10-CM

## 2013-02-25 MED ORDER — BENZONATATE 100 MG PO CAPS: 100.0000 mg | ORAL_CAPSULE | Freq: Two times a day (BID) | ORAL | Status: DC | PRN

## 2013-02-25 MED ORDER — CEFDINIR 300 MG PO CAPS: 300.0000 mg | ORAL_CAPSULE | Freq: Two times a day (BID) | ORAL | Status: AC

## 2013-02-25 MED ORDER — METHYLPREDNISOLONE ACETATE 40 MG/ML IJ SUSP
20.0000 mg | Freq: Once | INTRAMUSCULAR | Status: AC
  Administered 2013-02-25: 20 mg via INTRAMUSCULAR

## 2013-02-25 MED ORDER — ALBUTEROL SULFATE HFA 108 (90 BASE) MCG/ACT IN AERS: 2.0000 | INHALATION_SPRAY | Freq: Four times a day (QID) | RESPIRATORY_TRACT | Status: DC | PRN

## 2013-02-25 NOTE — Patient Instructions (Signed)
Mucinex 600 mg twice daily x 10 days Increase fluids Probiotic daily Digestive Advantage, Florastor, Culturelle, Align   Pneumonia, Adult Pneumonia is an infection of the lungs.  CAUSES Pneumonia may be caused by bacteria or a virus. Usually, these infections are caused by breathing infectious particles into the lungs (respiratory tract). SYMPTOMS   Cough.  Fever.  Chest pain.  Increased rate of breathing.  Wheezing.  Mucus production. DIAGNOSIS  If you have the common symptoms of pneumonia, your caregiver will typically confirm the diagnosis with a chest X-ray. The X-ray will show an abnormality in the lung (pulmonary infiltrate) if you have pneumonia. Other tests of your blood, urine, or sputum may be done to find the specific cause of your pneumonia. Your caregiver may also do tests (blood gases or pulse oximetry) to see how well your lungs are working. TREATMENT  Some forms of pneumonia may be spread to other people when you cough or sneeze. You may be asked to wear a mask before and during your exam. Pneumonia that is caused by bacteria is treated with antibiotic medicine. Pneumonia that is caused by the influenza virus may be treated with an antiviral medicine. Most other viral infections must run their course. These infections will not respond to antibiotics.  PREVENTION A pneumococcal shot (vaccine) is available to prevent a common bacterial cause of pneumonia. This is usually suggested for:  People over 8 years old.  Patients on chemotherapy.  People with chronic lung problems, such as bronchitis or emphysema.  People with immune system problems. If you are over 65 or have a high risk condition, you may receive the pneumococcal vaccine if you have not received it before. In some countries, a routine influenza vaccine is also recommended. This vaccine can help prevent some cases of pneumonia.You may be offered the influenza vaccine as part of your care. If you smoke, it  is time to quit. You may receive instructions on how to stop smoking. Your caregiver can provide medicines and counseling to help you quit. HOME CARE INSTRUCTIONS   Cough suppressants may be used if you are losing too much rest. However, coughing protects you by clearing your lungs. You should avoid using cough suppressants if you can.  Your caregiver may have prescribed medicine if he or she thinks your pneumonia is caused by a bacteria or influenza. Finish your medicine even if you start to feel better.  Your caregiver may also prescribe an expectorant. This loosens the mucus to be coughed up.  Only take over-the-counter or prescription medicines for pain, discomfort, or fever as directed by your caregiver.  Do not smoke. Smoking is a common cause of bronchitis and can contribute to pneumonia. If you are a smoker and continue to smoke, your cough may last several weeks after your pneumonia has cleared.  A cold steam vaporizer or humidifier in your room or home may help loosen mucus.  Coughing is often worse at night. Sleeping in a semi-upright position in a recliner or using a couple pillows under your head will help with this.  Get rest as you feel it is needed. Your body will usually let you know when you need to rest. SEEK IMMEDIATE MEDICAL CARE IF:   Your illness becomes worse. This is especially true if you are elderly or weakened from any other disease.  You cannot control your cough with suppressants and are losing sleep.  You begin coughing up blood.  You develop pain which is getting worse or is uncontrolled  with medicines.  You have a fever.  Any of the symptoms which initially brought you in for treatment are getting worse rather than better.  You develop shortness of breath or chest pain. MAKE SURE YOU:   Understand these instructions.  Will watch your condition.  Will get help right away if you are not doing well or get worse. Document Released: 02/04/2005  Document Revised: 04/29/2011 Document Reviewed: 04/26/2010 [facility] Patient Information 2014 [place], [place].

## 2013-02-25 NOTE — Progress Notes (Signed)
Pre visit review using our clinic review tool, if applicable. No additional management support is needed unless otherwise documented below in the visit note. 

## 2013-02-28 ENCOUNTER — Encounter: Payer: Self-pay | Admitting: Family Medicine

## 2013-02-28 NOTE — Assessment & Plan Note (Signed)
Well controlled despite illness, no changes

## 2013-02-28 NOTE — Progress Notes (Signed)
Patient ID: [name], male   DOB: Jul 04, 1940, 73 y.o.   MRN: [id number] [name] [id number] Feb 07, 1941 02/28/2013      Progress Note-Follow Up  Subjective  Chief Complaint  Chief Complaint  Patient presents with  . Cough    X 1.5 weeks- no phlegm  . chest congestion    X 1.5 weeks    HPI  Patient is a 73 year old male who is in today accompanied by his wife. He is recently been struggling with pneumonia. Took a short course of Levaquin and did get somewhat better but has not fully resolved. He continues to struggle with fatigue and malaise. He has myalgias and cough. He has some sputum production and head congestion as well as low-grade chills. Denies fevers. No palpitations. No GI or GU complaints at this time.  Past Medical History  Diagnosis Date  . Hypertension   . Hyperlipidemia   . Preoperative cardiovascular examination   . Spinal stenosis, lumbar   . CAS (cerebral atherosclerosis)   . Erectile dysfunction   . Preventative health care   . Benign prostatic hypertrophy with urinary obstruction   . Family history of early CAD     male 1st degree relative <50  . Peptic ulcer disease   . Low back pain   . Superficial spreading melanoma   . Sleep apnea, obstructive     CPAP-does not use  . HA (headache)     sinus headaches  . Arthritis     knees  . Myocardial infarction 1997    Hx MI 1997 and 1998  . Anxiety   . Pneumonia 2005  . Anemia   . Depression     takes Zoloft  . Neuropathy, autonomic, idiopathic peripheral, other   . Blood transfusion 1 yr. ago    jerking,fever-after blood transfusion  . Blood transfusion     given wrong blood  . Coronary artery disease 1998    cardiac stent/ Clearance Dr Arnoldo Morale and Einar Gip with note on chart  . Dysrhythmia     hx of atrial fib per ov note of 12/12   . Bradycardia     hx of bradycardia in past per ov note of 12/12  . GERD (gastroesophageal reflux disease)     Past Surgical History  Procedure  Laterality Date  . Esophagogastroduodenoscopy  2004  . Colonoscopy  2004  . Lumbar laminectomy    . Lumbar fusion    . Knee arthroscopy      left  . Shoulder arthroscopy      right  . Knee arthroscopy      right  . Decompression of the median nerve      left wrist and hand  . Revision of tkr  2011    on left  . Coronary stent placement    . Back surgery  2005-last    lumbar x2  . Excisional total knee arthroplasty  12/25/2010    Procedure: EXCISIONAL TOTAL KNEE ARTHROPLASTY;  Surgeon: Mauri Pole;  Location: [facility];  Service: Orthopedics;  Laterality: Right;  repeat irrigation   and debridement, removal and reinsertation of spacer block  . Joint replacement  2011    left knee x 2 ; 08/2010-right knee-infected  . I&d extremity  03/25/2011    Procedure: IRRIGATION AND DEBRIDEMENT EXTREMITY;  Surgeon: Mauri Pole, MD;  Location: [facility];  Service: Orthopedics;  Laterality: Right;  . Coronary angioplasty  [place]   . Total  knee revision  05/13/2011    Procedure: TOTAL KNEE REVISION;  Surgeon: Mauri Pole, MD;  Location: [facility];  Service: Orthopedics;  Laterality: Right;  Reimplantation    Family History  Problem Relation Age of Onset  . Heart disease Father   . Heart disease Mother   . Diabetes Sister   . Heart disease Brother     History   Social History  . Marital Status: Married    Spouse Name: N/A    Number of Children: N/A  . Years of Education: N/A   Occupational History  . diesel repair     part time  . worked at [employer]  . Smoking status: Former Smoker -- 1.00 packs/day for 15 years    Types: Cigars    Quit date: 12/23/2009  . Smokeless tobacco: Never Used     Comment: smoked a pipe for 6-7 y ears  . Alcohol Use: No  . Drug Use: No  . Sexual Activity: Not on file   Other Topics Concern  . Not on file   Social History Narrative  . No narrative on file    Current Outpatient Prescriptions on File Prior to  Visit  Medication Sig Dispense Refill  . gabapentin (NEURONTIN) 400 MG capsule Take 1 capsule (400 mg total) by mouth 3 (three) times daily.  90 capsule  5  . levofloxacin (LEVAQUIN) 500 MG tablet Take 1 tablet (500 mg total) by mouth daily.  4 tablet  0  . levofloxacin (LEVAQUIN) 750 MG tablet Take 1 tablet (750 mg total) by mouth daily.  7 tablet  0  . metoCLOPramide (REGLAN) 10 MG tablet Take 10 mg by mouth 2 (two) times daily.       Marland Kitchen morphine (MS CONTIN) 30 MG 12 hr tablet Take 1 tablet (30 mg total) by mouth 3 (three) times daily. Must last 30 days  90 tablet  0  . NITROSTAT 0.4 MG SL tablet Place 0.4 mg under the tongue every 5 (five) minutes as needed for chest pain. q 5 minutes x 3      . omeprazole (PRILOSEC) 20 MG capsule Take 1 capsule (20 mg total) by mouth daily.  30 capsule  3  . pindolol (VISKEN) 10 MG tablet Take 0.5 tablets by mouth 2 (two) times daily.       Marland Kitchen PRADAXA 150 MG CAPS Take 150 mg by mouth 2 (two) times daily.       . simvastatin (ZOCOR) 40 MG tablet Take 40 mg by mouth at bedtime.      . [DISCONTINUED] zolpidem (AMBIEN) 5 MG tablet Take 1 tablet (5 mg total) by mouth at bedtime as needed for sleep.  20 tablet  0   No current facility-administered medications on file prior to visit.    Allergies  Allergen Reactions  . Acetaminophen Other (See Comments)    LIVER INFLAMMATION IN HIGH DOSES    Review of Systems  Review of Systems  Constitutional: Positive for malaise/fatigue. Negative for fever.  HENT: Positive for congestion.   Eyes: Negative for discharge.  Respiratory: Positive for cough, sputum production, shortness of breath and wheezing.   Cardiovascular: Negative for chest pain, palpitations and leg swelling.  Gastrointestinal: Negative for nausea, abdominal pain and diarrhea.  Genitourinary: Negative for dysuria.  Musculoskeletal: Negative for falls.  Skin: Negative for rash.  Neurological: Negative for loss of consciousness and headaches.   Endo/Heme/Allergies: Negative for polydipsia.  Psychiatric/Behavioral: Negative for  depression and suicidal ideas. The patient is not nervous/anxious and does not have insomnia.     Objective  BP 128/82  Pulse 83  Temp(Src) 98.2 F (36.8 C) (Oral)  Ht 5\' 8"  (1.727 m)  Wt 188 lb 6.4 oz (85.458 kg)  BMI 28.65 kg/m2  SpO2 93%  Physical Exam  Physical Exam  Constitutional: He is oriented to person, place, and time and well-developed, well-nourished, and in no distress. No distress.  HENT:  Head: Normocephalic and atraumatic.  Eyes: Conjunctivae are normal.  Neck: Neck supple. No thyromegaly present.  Cardiovascular: Normal rate, regular rhythm and normal heart sounds.   No murmur heard. Pulmonary/Chest: Effort normal. No respiratory distress. He has wheezes.  B/l wheezing and rhonchi  Abdominal: He exhibits no distension and no mass. There is no tenderness.  Musculoskeletal: He exhibits no edema.  Neurological: He is alert and oriented to person, place, and time.  Skin: Skin is warm.  Psychiatric: Memory, affect and judgment normal.    Lab Results  Component Value Date   TSH 0.66 05/29/2009   Lab Results  Component Value Date   WBC 9.3 02/14/2013   HGB 12.0* 02/14/2013   HCT 35.3* 02/14/2013   MCV 93.1 02/14/2013   PLT 145* 02/14/2013   Lab Results  Component Value Date   CREATININE 1.30 02/16/2013   BUN 25* 02/16/2013   NA 138 02/16/2013   K 4.2 02/16/2013   CL 102 02/16/2013   CO2 26 02/16/2013   Lab Results  Component Value Date   ALT 43 02/15/2013   AST 125* 02/15/2013   ALKPHOS 90 02/15/2013   BILITOT 0.3 02/15/2013   Lab Results  Component Value Date   CHOL 114 03/28/2010   Lab Results  Component Value Date   HDL 25.20* 03/28/2010   No results found for this basename: LDLCALC   Lab Results  Component Value Date   TRIG 201.0* 03/28/2010   Lab Results  Component Value Date   CHOLHDL 5 03/28/2010     Assessment & Plan  HYPERTENSION Well  controlled despite illness, no changes  Community acquired pneumonia Was improving on Levaquin but has continued to struggle since stopping. Started on Albuterol, Tessalon and Cefdinir, encouraged probiotics and increased fluids.

## 2013-02-28 NOTE — Assessment & Plan Note (Signed)
Was improving on Levaquin but has continued to struggle since stopping. Started on Albuterol, Tessalon and Cefdinir, encouraged probiotics and increased fluids.

## 2013-03-12 ENCOUNTER — Ambulatory Visit: Payer: Medicare Other | Admitting: Physical Medicine & Rehabilitation

## 2013-03-14 ENCOUNTER — Ambulatory Visit (INDEPENDENT_AMBULATORY_CARE_PROVIDER_SITE_OTHER): Payer: Medicare Other | Admitting: Internal Medicine

## 2013-03-14 VITALS — BP 140/70 | HR 97 | Temp 98.3°F | Resp 16 | Ht 68.0 in | Wt 183.6 lb

## 2013-03-14 DIAGNOSIS — R7989 Other specified abnormal findings of blood chemistry: Secondary | ICD-10-CM

## 2013-03-14 DIAGNOSIS — K140 Glossitis: Secondary | ICD-10-CM

## 2013-03-14 DIAGNOSIS — R945 Abnormal results of liver function studies: Secondary | ICD-10-CM

## 2013-03-14 DIAGNOSIS — N179 Acute kidney failure, unspecified: Secondary | ICD-10-CM

## 2013-03-14 DIAGNOSIS — B370 Candidal stomatitis: Secondary | ICD-10-CM

## 2013-03-14 DIAGNOSIS — R110 Nausea: Secondary | ICD-10-CM

## 2013-03-14 LAB — POCT CBC
Granulocyte percent: 69.4 %G (ref 37–80)
HCT, POC: 45.5 % (ref 43.5–53.7)
Hemoglobin: 13.9 g/dL — AB (ref 14.1–18.1)
Lymph, poc: 2.2 (ref 0.6–3.4)
MCH, POC: 29.9 pg (ref 27–31.2)
MCHC: 30.5 g/dL — AB (ref 31.8–35.4)
MCV: 97.9 fL — AB (ref 80–97)
MID (CBC): 0.7 (ref 0–0.9)
MPV: 6.7 fL (ref 0–99.8)
PLATELET COUNT, POC: 282 10*3/uL (ref 142–424)
POC Granulocyte: 6.6 (ref 2–6.9)
POC LYMPH %: 23.5 % (ref 10–50)
POC MID %: 7.1 %M (ref 0–12)
RBC: 4.65 M/uL — AB (ref 4.69–6.13)
RDW, POC: 15.2 %
WBC: 9.5 10*3/uL (ref 4.6–10.2)

## 2013-03-14 LAB — POCT WET PREP WITH KOH
Bacteria Wet Prep HPF POC: NEGATIVE
CLUE CELLS WET PREP PER HPF POC: NEGATIVE
Epithelial Wet Prep HPF POC: NEGATIVE
KOH PREP POC: POSITIVE
RBC WET PREP PER HPF POC: NEGATIVE
Trichomonas, UA: NEGATIVE
WBC Wet Prep HPF POC: NEGATIVE
Yeast Wet Prep HPF POC: NEGATIVE

## 2013-03-14 MED ORDER — ONDANSETRON HCL 4 MG PO TABS: 4.0000 mg | ORAL_TABLET | Freq: Three times a day (TID) | ORAL | Status: DC | PRN

## 2013-03-14 MED ORDER — FLUCONAZOLE 150 MG PO TABS: 150.0000 mg | ORAL_TABLET | Freq: Every day | ORAL | Status: DC

## 2013-03-14 NOTE — Progress Notes (Addendum)
Subjective:    Patient ID: [name], male    DOB: 14-Feb-1941, 73 y.o.   MRN: [id number]  HPI This chart was scribed for Tami Lin, MD by Thea Alken, ED Scribe. This patient was seen in room 9 and the patient's care was started at 4:57 PM.  HPI Comments: [name] is a 73 y.o. male who presents to the [facility] complaining of nausea onset 1 day ago. He reports he was experiencing intermittent nausea yesterday but reports his nausea is constant today. He reports he has been eating and drink fine. He states that he doesn't have much of an appetite but when he does eat he reports that  food taste fine. He denies emesis. He reports his last normal BM was  2 days ago. Pt reports that he does not have much energy. He reports this is not normal. He denies dysuria, odor in urine, urinary frequency, constipation, diarrhea, abdominal pain, fever, and chills.   Importantly he was hospitalized with a community-acquired pneumonia thought secondary to influenza A at the end of December, treated with Zithromax and Rocephin and sent home on Levaquin. Had a second round of Levaquin and then was changed to Cefdinir. He feels his breathing is much better over the last few days. During the hospitalization his creatinine was elevated and he had abnormal liver functions.  Past Medical History  Diagnosis Date  . Hypertension   . Hyperlipidemia   . Preoperative cardiovascular examination   . Spinal stenosis, lumbar   . CAS (cerebral atherosclerosis)   . Erectile dysfunction   . Preventative health care   . Benign prostatic hypertrophy with urinary obstruction   . Family history of early CAD     male 1st degree relative <50  . Peptic ulcer disease   . Low back pain   . Superficial spreading melanoma   . Sleep apnea, obstructive     CPAP-does not use  . HA (headache)     sinus headaches  . Arthritis     knees  . Myocardial infarction 1997    Hx MI 1997 and 1998  .  Anxiety   . Pneumonia 2005  . Anemia   . Depression     takes Zoloft  . Neuropathy, autonomic, idiopathic peripheral, other   . Blood transfusion 1 yr. ago    jerking,fever-after blood transfusion  . Blood transfusion     given wrong blood  . Coronary artery disease 1998    cardiac stent/ Clearance Dr Arnoldo Morale and Einar Gip with note on chart  . Dysrhythmia     hx of atrial fib per ov note of 12/12   . Bradycardia     hx of bradycardia in past per ov note of 12/12  . GERD (gastroesophageal reflux disease)    Allergies  Allergen Reactions  . Acetaminophen Other (See Comments)    LIVER INFLAMMATION IN HIGH DOSES   Prior to Admission medications   Medication Sig Start Date End Date Taking? Authorizing Provider  albuterol (PROVENTIL HFA;VENTOLIN HFA) 108 (90 BASE) MCG/ACT inhaler Inhale 2 puffs into the lungs every 6 (six) hours as needed for wheezing or shortness of breath. 02/25/13  Yes Mosie Lukes, MD  benzonatate (TESSALON) 100 MG capsule Take 1 capsule (100 mg total) by mouth 2 (two) times daily as needed for cough. 02/25/13  Yes Mosie Lukes, MD  [place] 125 MCG tablet  12/28/12  Yes Historical Provider, MD  DYMISTA 137-50 MCG/ACT SUSP  01/02/13  Yes Historical Provider, MD  gabapentin (NEURONTIN) 400 MG capsule Take 1 capsule (400 mg total) by mouth 3 (three) times daily. 09/09/12 09/09/13 Yes Ricard Dillon, MD  lisinopril (PRINIVIL,ZESTRIL) 40 MG tablet  02/09/13  Yes Historical Provider, MD  metoCLOPramide (REGLAN) 10 MG tablet Take 10 mg by mouth 2 (two) times daily.  01/23/13  Yes Historical Provider, MD  morphine (MS CONTIN) 30 MG 12 hr tablet Take 1 tablet (30 mg total) by mouth 3 (three) times daily. Must last 30 days 02/08/13  Yes Meredith Staggers, MD  NITROSTAT 0.4 MG SL tablet Place 0.4 mg under the tongue every 5 (five) minutes as needed for chest pain. q 5 minutes x 3 05/22/12  Yes Historical Provider, MD  omeprazole (PRILOSEC) 20 MG capsule Take 1 capsule (20 mg total) by  mouth daily. 10/26/12  Yes Timoteo Gaul, FNP  pindolol (VISKEN) 10 MG tablet Take 0.5 tablets by mouth 2 (two) times daily.  01/08/13  Yes Historical Provider, MD  PRADAXA 150 MG CAPS Take 150 mg by mouth 2 (two) times daily.  07/27/12  Yes Historical Provider, MD  simvastatin (ZOCOR) 40 MG tablet Take 40 mg by mouth at bedtime.   Yes Historical Provider, MD  levofloxacin (LEVAQUIN) 500 MG tablet Take 1 tablet (500 mg total) by mouth daily. 02/16/13   Hosie Poisson, MD  levofloxacin (LEVAQUIN) 750 MG tablet Take 1 tablet (750 mg total) by mouth daily. 02/19/13   Ricard Dillon, MD    Review of Systems  Constitutional: Negative for fever, chills and unexpected weight change.       He notes fatigue over the last 2 months  Gastrointestinal: Positive for nausea. Negative for vomiting, abdominal pain, diarrhea and constipation.  Genitourinary: Negative for dysuria, frequency and decreased urine volume.       Objective:   Physical Exam  Constitutional: He is oriented to person, place, and time. He appears well-developed and well-nourished. No distress.  HENT:  Right Ear: External ear normal.  Left Ear: External ear normal.  Nose: Nose normal.  Tongue is beefy red and has no clear white exudate  Eyes: Conjunctivae and EOM are normal. Pupils are equal, round, and reactive to light.  Neck: Normal range of motion.  Cardiovascular: Normal rate, regular rhythm and normal heart sounds.   No murmur heard. Pulmonary/Chest: Effort normal. He has no wheezes. He has no rales.  Abdominal: Bowel sounds are normal. He exhibits no distension. There is no tenderness. There is no rebound.  No organomegaly, and is definitely not tender in the area of the pancreas  Lymphadenopathy:    He has no cervical adenopathy.  Neurological: He is alert and oriented to person, place, and time.  Psychiatric: Judgment normal.   Filed Vitals:   03/14/13 1634  BP: 140/70  Pulse: 97  Temp: 98.3 F (36.8 C)  Resp: 16     Results for orders placed in visit on 03/14/13  POCT CBC      Result Value Range   WBC 9.5  4.6 - 10.2 K/uL   Lymph, poc 2.2  0.6 - 3.4   POC LYMPH PERCENT 23.5  10 - 50 %L   MID (cbc) 0.7  0 - 0.9   POC MID % 7.1  0 - 12 %M   POC Granulocyte 6.6  2 - 6.9   Granulocyte percent 69.4  37 - 80 %G   RBC 4.65 (*) 4.69 - 6.13 M/uL   Hemoglobin 13.9 (*)  14.1 - 18.1 g/dL   HCT, POC 45.5  43.5 - 53.7 %   MCV 97.9 (*) 80 - 97 fL   MCH, POC 29.9  27 - 31.2 pg   MCHC 30.5 (*) 31.8 - 35.4 g/dL   RDW, POC 15.2     Platelet Count, POC 282  142 - 424 K/uL   MPV 6.7  0 - 99.8 fL  POCT WET PREP WITH KOH      Result Value Range   Trichomonas, UA Negative     Clue Cells Wet Prep HPF POC neg     Epithelial Wet Prep HPF POC neg     Yeast Wet Prep HPF POC neg     Bacteria Wet Prep HPF POC neg     RBC Wet Prep HPF POC neg     WBC Wet Prep HPF POC neg     KOH Prep POC Positive from tongue swab        Assessment & Plan:  I have completed the patient encounter in its entirety as documented by the scribe, with editing by me where necessary. Alexander Mcauley P. Laney Pastor, M.D. Nausea alone -? etio--rx zofr w/ close f/u---may need scan to r/o intraabd path  Acute renal failure in hosp recent w/out lab f/u---reck today  Abnormal LFTs -noted in recent hosp labs--needing reck  Glossitis due to antibio assoc candidiasis- Diflu 150x3///b12/folate    Meds ordered this encounter  Medications  . fluconazole (DIFLUCAN) 150 MG tablet    Sig: Take 1 tablet (150 mg total) by mouth daily. For 3 days    Dispense:  3 tablet    Refill:  0  . ondansetron (ZOFRAN) 4 MG tablet    Sig: Take 1 tablet (4 mg total) by mouth every 8 (eight) hours as needed for nausea or vomiting.    Dispense:  10 tablet    Refill:  0

## 2013-03-15 ENCOUNTER — Other Ambulatory Visit: Payer: Self-pay | Admitting: *Deleted

## 2013-03-15 ENCOUNTER — Telehealth: Payer: Self-pay | Admitting: Internal Medicine

## 2013-03-15 LAB — COMPREHENSIVE METABOLIC PANEL
ALK PHOS: 117 U/L (ref 39–117)
ALT: 14 U/L (ref 0–53)
AST: 23 U/L (ref 0–37)
Albumin: 4.2 g/dL (ref 3.5–5.2)
BUN: 14 mg/dL (ref 6–23)
CALCIUM: 9.7 mg/dL (ref 8.4–10.5)
CO2: 25 mEq/L (ref 19–32)
CREATININE: 1.18 mg/dL (ref 0.50–1.35)
Chloride: 104 mEq/L (ref 96–112)
Glucose, Bld: 102 mg/dL — ABNORMAL HIGH (ref 70–99)
Potassium: 4.7 mEq/L (ref 3.5–5.3)
Sodium: 137 mEq/L (ref 135–145)
Total Bilirubin: 0.5 mg/dL (ref 0.3–1.2)
Total Protein: 7.7 g/dL (ref 6.0–8.3)

## 2013-03-15 LAB — VITAMIN B12: VITAMIN B 12: 799 pg/mL (ref 211–911)

## 2013-03-15 LAB — FOLATE: Folate: >20 ng/mL

## 2013-03-15 MED ORDER — OMEPRAZOLE 20 MG PO CPDR: 20.0000 mg | DELAYED_RELEASE_CAPSULE | Freq: Every day | ORAL | Status: DC

## 2013-03-15 NOTE — Telephone Encounter (Signed)
[facility] requesting refill of omeprazole (PRILOSEC) 20 MG capsule #30 last filled 02/09/13

## 2013-03-15 NOTE — Telephone Encounter (Signed)
Done

## 2013-03-22 ENCOUNTER — Encounter: Payer: Self-pay | Admitting: Physical Medicine & Rehabilitation

## 2013-03-22 ENCOUNTER — Encounter: Payer: Medicare Other | Attending: Physical Medicine & Rehabilitation

## 2013-03-22 ENCOUNTER — Ambulatory Visit (HOSPITAL_BASED_OUTPATIENT_CLINIC_OR_DEPARTMENT_OTHER): Payer: Medicare Other | Admitting: Physical Medicine & Rehabilitation

## 2013-03-22 VITALS — BP 149/92 | HR 95 | Resp 16 | Ht 69.0 in | Wt 189.0 lb

## 2013-03-22 DIAGNOSIS — G8929 Other chronic pain: Secondary | ICD-10-CM

## 2013-03-22 DIAGNOSIS — M533 Sacrococcygeal disorders, not elsewhere classified: Secondary | ICD-10-CM

## 2013-03-22 DIAGNOSIS — M549 Dorsalgia, unspecified: Secondary | ICD-10-CM

## 2013-03-22 DIAGNOSIS — M21371 Foot drop, right foot: Secondary | ICD-10-CM

## 2013-03-22 DIAGNOSIS — M216X9 Other acquired deformities of unspecified foot: Secondary | ICD-10-CM

## 2013-03-22 DIAGNOSIS — M545 Low back pain, unspecified: Secondary | ICD-10-CM | POA: Insufficient documentation

## 2013-03-22 DIAGNOSIS — M47817 Spondylosis without myelopathy or radiculopathy, lumbosacral region: Secondary | ICD-10-CM | POA: Insufficient documentation

## 2013-03-22 DIAGNOSIS — G609 Hereditary and idiopathic neuropathy, unspecified: Secondary | ICD-10-CM | POA: Insufficient documentation

## 2013-03-22 DIAGNOSIS — Z96659 Presence of unspecified artificial knee joint: Secondary | ICD-10-CM | POA: Insufficient documentation

## 2013-03-22 DIAGNOSIS — M21372 Foot drop, left foot: Secondary | ICD-10-CM

## 2013-03-22 DIAGNOSIS — Z79899 Other long term (current) drug therapy: Secondary | ICD-10-CM | POA: Insufficient documentation

## 2013-03-22 DIAGNOSIS — M961 Postlaminectomy syndrome, not elsewhere classified: Secondary | ICD-10-CM | POA: Insufficient documentation

## 2013-03-22 DIAGNOSIS — F1920 Other psychoactive substance dependence, uncomplicated: Secondary | ICD-10-CM | POA: Insufficient documentation

## 2013-03-22 DIAGNOSIS — M79609 Pain in unspecified limb: Secondary | ICD-10-CM | POA: Insufficient documentation

## 2013-03-22 DIAGNOSIS — M48061 Spinal stenosis, lumbar region without neurogenic claudication: Secondary | ICD-10-CM

## 2013-03-22 MED ORDER — MORPHINE SULFATE ER 30 MG PO TBCR: 30.0000 mg | EXTENDED_RELEASE_TABLET | Freq: Three times a day (TID) | ORAL | Status: DC

## 2013-03-22 NOTE — Patient Instructions (Signed)
For exercise recommended treadmill 10 minutes 3 times per day either 1 hour before or 2 hours after a meal

## 2013-03-22 NOTE — Progress Notes (Signed)
Subjective:    Patient ID: [name], male    DOB: [birth year], 73 y.o.   MRN: [id number]  HPI . He feels his pain medication and morphine extended release 30 mg twice a days helpful for him. He had a bilateral sacroiliac injection 10/22/2012 which relieved about 50% of his pain in the low back area. He had a previous L3 L4-L5 medial branch block in June of 2014 which also was quite helpful for his pain. He feels his injections are still effective in are not were not yet.  Wife feels patient is not active enough No falls Pain Inventory Average Pain 6 Pain Right Now 6 My pain is dull  In the last 24 hours, has pain interfered with the following? General activity 0 Relation with others 0 Enjoyment of life 0 What TIME of day is your pain at its worst? night Sleep (in general) Good  Pain is worse with: standing Pain improves with: rest and medication Relief from Meds: 8  Mobility use a cane ability to climb steps?  yes do you drive?  yes  Function retired  Neuro/Psych trouble walking  Prior Studies Any changes since last visit?  no  Physicians involved in your care Any changes since last visit?  no   Family History  Problem Relation Age of Onset  . Heart disease Father   . Heart disease Mother   . Diabetes Sister   . Heart disease Brother    History   Social History  . Marital Status: Married    Spouse Name: N/A    Number of Children: N/A  . Years of Education: N/A   Occupational History  . diesel repair     part time  . worked at [employer]  . Smoking status: Former Smoker -- 1.00 packs/day for 15 years    Types: Cigars    Quit date: 12/23/2009  . Smokeless tobacco: Never Used     Comment: smoked a pipe for 6-7 y ears  . Alcohol Use: No  . Drug Use: No  . Sexual Activity: None   Other Topics Concern  . None   Social History Narrative  . None   Past Surgical History  Procedure Laterality Date  .  Esophagogastroduodenoscopy  2004  . Colonoscopy  2004  . Lumbar laminectomy    . Lumbar fusion    . Knee arthroscopy      left  . Shoulder arthroscopy      right  . Knee arthroscopy      right  . Decompression of the median nerve      left wrist and hand  . Revision of tkr  2011    on left  . Coronary stent placement    . Back surgery  2005-last    lumbar x2  . Excisional total knee arthroplasty  12/25/2010    Procedure: EXCISIONAL TOTAL KNEE ARTHROPLASTY;  Surgeon: Mauri Pole;  Location: [facility];  Service: Orthopedics;  Laterality: Right;  repeat irrigation   and debridement, removal and reinsertation of spacer block  . Joint replacement  2011    left knee x 2 ; 08/2010-right knee-infected  . I&d extremity  03/25/2011    Procedure: IRRIGATION AND DEBRIDEMENT EXTREMITY;  Surgeon: Mauri Pole, MD;  Location: [facility];  Service: Orthopedics;  Laterality: Right;  . Coronary angioplasty  [place]   . Total knee revision  05/13/2011  Procedure: TOTAL KNEE REVISION;  Surgeon: Mauri Pole, MD;  Location: [facility];  Service: Orthopedics;  Laterality: Right;  Reimplantation   Past Medical History  Diagnosis Date  . Hypertension   . Hyperlipidemia   . Preoperative cardiovascular examination   . Spinal stenosis, lumbar   . CAS (cerebral atherosclerosis)   . Erectile dysfunction   . Preventative health care   . Benign prostatic hypertrophy with urinary obstruction   . Family history of early CAD     male 1st degree relative <50  . Peptic ulcer disease   . Low back pain   . Superficial spreading melanoma   . Sleep apnea, obstructive     CPAP-does not use  . HA (headache)     sinus headaches  . Arthritis     knees  . Myocardial infarction 1997    Hx MI 1997 and 1998  . Anxiety   . Pneumonia 2005  . Anemia   . Depression     takes Zoloft  . Neuropathy, autonomic, idiopathic peripheral, other   . Blood transfusion 1 yr. ago    jerking,fever-after blood  transfusion  . Blood transfusion     given wrong blood  . Coronary artery disease 1998    cardiac stent/ Clearance Dr Arnoldo Morale and Einar Gip with note on chart  . Dysrhythmia     hx of atrial fib per ov note of 12/12   . Bradycardia     hx of bradycardia in past per ov note of 12/12  . GERD (gastroesophageal reflux disease)    BP 149/92  Pulse 95  Resp 16  Ht 5\' 9"  (1.753 m)  Wt 189 lb (85.73 kg)  BMI 27.90 kg/m2  SpO2 97%  Opioid Risk Score:   Fall Risk Score: Moderate Fall Risk (6-13 points) (patient educated hangout given)   Review of Systems  Musculoskeletal: Positive for back pain and gait problem.  All other systems reviewed and are negative.       Objective:   Physical Exam   Lumbar range of motion has 100% of forward flexion less than 25% of extension  Motor strength /5 bilateral hip flexors, 5/5 bilateral knee extensors 2 minus/5 right ankle dorsiflexor in 3 minus/5 left ankle dorsiflexor. Wearing foot up orthosis  No lumbar tenderness.  Pain with lumbar extension.  Normal sensation at the knees reduced sensation at the ankles and below  Ambulates with a rolling walker      Assessment & Plan:  1. Lumbar post lami with good improvements after both MBB and SI injections. RTC 1 mo , may need to repeat injections in the next 1-2 months  Will recheck in one month. If back pain starts increasing would repeat medial branch blocks. If buttocks pain starts increasing would repeat sacroiliac injections  #2. Bilateral foot drop from chronic radiculopathy. Continue foot up orthosis as well as gabapentin. Use cane for ambulation  For exercise recommended treadmill 10 minutes 3 times per day either 1 hour before or 2 hours after a meal

## 2013-03-25 ENCOUNTER — Encounter: Payer: Self-pay | Admitting: Internal Medicine

## 2013-04-02 ENCOUNTER — Ambulatory Visit: Payer: Medicare Other | Admitting: Internal Medicine

## 2013-04-02 ENCOUNTER — Ambulatory Visit: Payer: Medicare Other | Admitting: Family Medicine

## 2013-04-05 ENCOUNTER — Ambulatory Visit: Payer: Medicare Other | Admitting: Gastroenterology

## 2013-04-07 ENCOUNTER — Ambulatory Visit: Payer: Medicare Other | Admitting: Gastroenterology

## 2013-04-09 ENCOUNTER — Telehealth: Payer: Self-pay

## 2013-04-09 MED ORDER — METHOCARBAMOL 500 MG PO TABS: 500.0000 mg | ORAL_TABLET | Freq: Three times a day (TID) | ORAL | Status: DC

## 2013-04-09 NOTE — Telephone Encounter (Signed)
Call in Robaxin 500 mg 3 times a day number 90 no refills  Please schedule for bilateral lumbar medial branch block i instead of followup visit next mo Needs to be off pradaxa for 2 days prior to injection

## 2013-04-09 NOTE — Telephone Encounter (Signed)
Spoke with patient informed him we would call in robaxin.  Also advised him we would call him to schedule MBB and needs to be off pradaxa 2 days prior to injection.

## 2013-04-09 NOTE — Telephone Encounter (Signed)
Patient called and stated he is having extreme pain in his back today. Advised him to apply heat/ice. Patient said he has already tried heat/ice and had no relief. Any suggestions?

## 2013-04-11 ENCOUNTER — Ambulatory Visit (INDEPENDENT_AMBULATORY_CARE_PROVIDER_SITE_OTHER): Payer: Medicare Other | Admitting: Internal Medicine

## 2013-04-11 VITALS — BP 160/90 | HR 88 | Temp 98.2°F | Resp 16 | Ht 69.0 in | Wt 183.0 lb

## 2013-04-11 DIAGNOSIS — R110 Nausea: Secondary | ICD-10-CM

## 2013-04-11 DIAGNOSIS — I4891 Unspecified atrial fibrillation: Secondary | ICD-10-CM

## 2013-04-11 LAB — POCT URINALYSIS DIPSTICK
BILIRUBIN UA: NEGATIVE
Glucose, UA: NEGATIVE
Ketones, UA: NEGATIVE
LEUKOCYTES UA: NEGATIVE
Nitrite, UA: NEGATIVE
PH UA: 8.5
Protein, UA: 30
Spec Grav, UA: 1.015
Urobilinogen, UA: 0.2

## 2013-04-11 LAB — POCT CBC
GRANULOCYTE PERCENT: 63.9 % (ref 37–80)
HEMATOCRIT: 44.1 % (ref 43.5–53.7)
Hemoglobin: 13.7 g/dL — AB (ref 14.1–18.1)
Lymph, poc: 2.3 (ref 0.6–3.4)
MCH, POC: 29.7 pg (ref 27–31.2)
MCHC: 31.1 g/dL — AB (ref 31.8–35.4)
MCV: 95.6 fL (ref 80–97)
MID (cbc): 0.6 (ref 0–0.9)
MPV: 6.5 fL (ref 0–99.8)
POC Granulocyte: 5.1 (ref 2–6.9)
POC LYMPH PERCENT: 28.3 %L (ref 10–50)
POC MID %: 7.8 %M (ref 0–12)
Platelet Count, POC: 313 10*3/uL (ref 142–424)
RBC: 4.61 M/uL — AB (ref 4.69–6.13)
RDW, POC: 14.5 %
WBC: 8 10*3/uL (ref 4.6–10.2)

## 2013-04-11 LAB — POCT UA - MICROSCOPIC ONLY
Bacteria, U Microscopic: NEGATIVE
Casts, Ur, LPF, POC: NEGATIVE
Crystals, Ur, HPF, POC: NEGATIVE
EPITHELIAL CELLS, URINE PER MICROSCOPY: NEGATIVE
Mucus, UA: NEGATIVE
RBC, URINE, MICROSCOPIC: NEGATIVE
Yeast, UA: NEGATIVE

## 2013-04-11 MED ORDER — ONDANSETRON HCL 8 MG PO TABS: 8.0000 mg | ORAL_TABLET | Freq: Three times a day (TID) | ORAL | Status: DC | PRN

## 2013-04-11 NOTE — Progress Notes (Signed)
   Subjective:    Patient ID: [name], male    DOB: 06-01-1940, 73 y.o.   MRN: [id number]  HPI    Review of Systems     Objective:   Physical Exam        Assessment & Plan:

## 2013-04-11 NOTE — Progress Notes (Signed)
Subjective:    Patient ID: [name], male    DOB: 09-06-1940, 73 y.o.   MRN: [id number]  HPI Patient states he has been very nauseated x 2 days.  No other complaints.  Denies diarrhea or vomiting.  Patient says his grandkids have been sick No diarrhea, abdominal pain, fever, or sweating. He looks good. Has afib   Review of Systems     Objective:   Physical Exam  Vitals reviewed. Constitutional: He is oriented to person, place, and time. He appears well-developed and well-nourished. No distress.  HENT:  Head: Normocephalic.  Mouth/Throat: Oropharynx is clear and moist.  Eyes: Conjunctivae and EOM are normal. Pupils are equal, round, and reactive to light. No scleral icterus.  Neck: Normal range of motion. Neck supple.  Cardiovascular: Intact distal pulses.  An irregularly irregular rhythm present. Tachycardia present.   Pulmonary/Chest: Effort normal and breath sounds normal.  Abdominal: Soft. He exhibits no distension and no mass. There is no tenderness. There is no rebound and no guarding.  Musculoskeletal: He exhibits tenderness.  Uses walker for stability  Neurological: He is alert and oriented to person, place, and time. He has normal strength. He displays no tremor. He exhibits normal muscle tone. Gait abnormal. Coordination normal.  Skin: He is not diaphoretic.  Psychiatric: He has a normal mood and affect. His behavior is normal.     Results for orders placed in visit on 04/11/13  POCT CBC      Result Value Ref Range   WBC 8.0  4.6 - 10.2 K/uL   Lymph, poc 2.3  0.6 - 3.4   POC LYMPH PERCENT 28.3  10 - 50 %L   MID (cbc) 0.6  0 - 0.9   POC MID % 7.8  0 - 12 %M   POC Granulocyte 5.1  2 - 6.9   Granulocyte percent 63.9  37 - 80 %G   RBC 4.61 (*) 4.69 - 6.13 M/uL   Hemoglobin 13.7 (*) 14.1 - 18.1 g/dL   HCT, POC 44.1  43.5 - 53.7 %   MCV 95.6  80 - 97 fL   MCH, POC 29.7  27 - 31.2 pg   MCHC 31.1 (*) 31.8 - 35.4 g/dL   RDW, POC 14.5     Platelet Count, POC  313  142 - 424 K/uL   MPV 6.5  0 - 99.8 fL  POCT UA - MICROSCOPIC ONLY      Result Value Ref Range   WBC, Ur, HPF, POC 0-1     RBC, urine, microscopic neg     Bacteria, U Microscopic neg     Mucus, UA neg     Epithelial cells, urine per micros neg     Crystals, Ur, HPF, POC neg     Casts, Ur, LPF, POC neg     Yeast, UA neg    POCT URINALYSIS DIPSTICK      Result Value Ref Range   Color, UA yellow     Clarity, UA clear     Glucose, UA neg     Bilirubin, UA neg     Ketones, UA neg     Spec Grav, UA 1.015     Blood, UA tr-lysed     pH, UA 8.5     Protein, UA 30     Urobilinogen, UA 0.2     Nitrite, UA neg     Leukocytes, UA Negative     Dig level pending  Assessment & Plan:  Nausea cause unclear Atrial fibrillation/No chest pain or signs of angina Use 1/2 tab of 8 mg zofran prn for nausea See Dr. Arnoldo Morale tomorrow/Review each of your meds as a cause of nausea Blood did not clot in red top tube after 15 min/Possible taking too much pradaxa

## 2013-04-12 ENCOUNTER — Ambulatory Visit (INDEPENDENT_AMBULATORY_CARE_PROVIDER_SITE_OTHER): Payer: Medicare Other | Admitting: Physician Assistant

## 2013-04-12 ENCOUNTER — Encounter: Payer: Self-pay | Admitting: Physician Assistant

## 2013-04-12 VITALS — BP 170/90 | HR 76 | Ht 69.0 in | Wt 189.0 lb

## 2013-04-12 DIAGNOSIS — R12 Heartburn: Secondary | ICD-10-CM

## 2013-04-12 DIAGNOSIS — R110 Nausea: Secondary | ICD-10-CM

## 2013-04-12 LAB — DIGOXIN LEVEL: Digoxin Level: <0.1 ng/mL — ABNORMAL LOW (ref 0.8–2.0)

## 2013-04-12 MED ORDER — PROMETHAZINE HCL 25 MG PO TABS: ORAL_TABLET | ORAL | Status: DC

## 2013-04-12 NOTE — Progress Notes (Addendum)
Patient ID: [name], male    DOB: 1940-11-08, 73 y.o.   MRN: [id number]  HPI  [name]  is a 73 year old white male new to GI today referred by his primary care physician Dr. Rex Kras for evaluation of persistent nausea. Patient has multiple medical problems including atrial fibrillation, coronary artery disease, sleep apnea, spinal stenosis with chronic back pain. He had a recent community acquired pneumonia and has taken several courses of antibiotics this winter. Patient states that he's been nauseated on a daily basis over the past one month. Patient has digoxin on his list but his family says he has not been on this over the past couple of months.  Patient says that he is nauseated most of each day but that the symptoms do come and go. He says he generally wakes up nauseated and hasn't noticed any change in his symptoms with by mouth intake. He has not had any fever or chills. No diarrhea or melena. He says food has no taste and he has no appetite. He denies any dysphagia or odynophagia but has had an increase in heartburn and belching. His weight overall is stable as he has been pushing himself to eat .  He is not on any regular NSAIDs. He relates that he did have an endoscopy many years ago elsewhere. He has not had any surgery on his abdomen. No recent imaging. Most recent labs with CBC and seem at January 2015 were unremarkable. He is maintained on pradaxa   for atrial fibrillation.    Review of Systems  Constitutional: Positive for appetite change and fatigue.  HENT: Negative.   Eyes: Negative.   Respiratory: Negative.   Cardiovascular: Negative.   Gastrointestinal: Positive for nausea.  Endocrine: Negative.   Genitourinary: Negative.   Musculoskeletal: Positive for arthralgias and back pain.  Skin: Negative.   Allergic/Immunologic: Negative.   Neurological: Negative.   Hematological: Negative.    Outpatient Prescriptions Prior to Visit  Medication Sig Dispense  Refill  . ALPRAZolam (XANAX) 0.5 MG tablet       . methocarbamol (ROBAXIN) 500 MG tablet Take 1 tablet (500 mg total) by mouth 3 (three) times daily.  90 tablet  0  . metoprolol succinate (TOPROL-XL) 100 MG 24 hr tablet       . NITROSTAT 0.4 MG SL tablet Place 0.4 mg under the tongue every 5 (five) minutes as needed for chest pain. q 5 minutes x 3      . omeprazole (PRILOSEC) 20 MG capsule Take 1 capsule (20 mg total) by mouth daily.  30 capsule  11  . PRADAXA 150 MG CAPS Take 150 mg by mouth 2 (two) times daily.       Marland Kitchen albuterol (PROVENTIL HFA;VENTOLIN HFA) 108 (90 BASE) MCG/ACT inhaler Inhale 2 puffs into the lungs every 6 (six) hours as needed for wheezing or shortness of breath.  1 Inhaler  1  . DIGOX 125 MCG tablet       . DYMISTA 137-50 MCG/ACT SUSP       . gabapentin (NEURONTIN) 400 MG capsule Take 1 capsule (400 mg total) by mouth 3 (three) times daily.  90 capsule  5  . lisinopril (PRINIVIL,ZESTRIL) 40 MG tablet       . metoCLOPramide (REGLAN) 10 MG tablet Take 10 mg by mouth 2 (two) times daily.       Marland Kitchen morphine (MS CONTIN) 30 MG 12 hr tablet Take 1 tablet (30 mg total) by mouth 3 (  three) times daily. Must last 30 days  90 tablet  0  . ondansetron (ZOFRAN) 4 MG tablet Take 1 tablet (4 mg total) by mouth every 8 (eight) hours as needed for nausea or vomiting.  10 tablet  0  . ondansetron (ZOFRAN) 8 MG tablet Take 1 tablet (8 mg total) by mouth every 8 (eight) hours as needed for nausea or vomiting.  20 tablet  0  . simvastatin (ZOCOR) 40 MG tablet Take 40 mg by mouth at bedtime.       No facility-administered medications prior to visit.   Allergies  Allergen Reactions  . Acetaminophen Other (See Comments)    LIVER INFLAMMATION IN HIGH DOSES   Patient Active Problem List   Diagnosis Date Noted  . Community acquired pneumonia 02/14/2013  . CAP (community acquired pneumonia) 02/14/2013  . Acute renal failure 02/14/2013  . Sacroiliac joint disease 01/08/2013  . Allergic rhinitis  10/19/2012  . Foot drop, bilateral 09/08/2012  . Chronic low back pain 06/23/2012  . Left flank pain 09/23/2011  . S/P right TK revision 05/13/2011  . ACTINIC KERATOSIS 09/29/2009  . CALF PAIN, LEFT 09/05/2009  . SPRAIN&STRAIN OF UNSPECIFIED SITE OF KNEE&LEG 09/05/2009  . MELANOMA, SPREADING, SUPERFICIAL 05/29/2009  . OTHER IDIOPATHIC PERIPHERAL AUTONOMIC NEUROPATHY 03/23/2009  . CEREBRAL ATHEROSCLEROSIS 06/06/2008  . SPINAL STENOSIS OF LUMBAR REGION 06/06/2008  . ERECTILE DYSFUNCTION 04/25/2008  . BENIGN PROSTATIC HYPERTROPHY, WITH URINARY OBSTRUCTION 06/19/2007  . SLEEP APNEA, OBSTRUCTIVE, MODERATE 12/16/2006  . HYPERLIPIDEMIA 11/24/2006  . HYPERTENSION 11/24/2006  . PEPTIC ULCER DISEASE 11/24/2006  . LOW BACK PAIN 11/24/2006  . SYMPTOM, NOCTURIA 11/24/2006  . CORONARY ARTERY DISEASE 09/03/2006   History  Substance Use Topics  . Smoking status: Former Smoker -- 1.00 packs/day for 15 years    Types: Cigars    Quit date: 12/23/2009  . Smokeless tobacco: Never Used     Comment: smoked a pipe for 6-7 y ears  . Alcohol Use: No      family history includes Diabetes in his sister; Heart disease in his brother, father, and mother.  Objective:   Physical Exam Well-developed chronically ill-appearing elderly white male in no acute distress accompanied by his family. Patient in a wheelchair blood pressure 170/90 pulse 76 height 5 foot 9 weight 189. HEENT; nontraumatic normocephalic EOMI PERRLA sclera anicteric, Supple; no JVD, Cardiovascular; irregular rate and rhythm with S1-S2 no murmur or gallop, Pulmonary; clear bilaterally, Abdomen ;soft nontender nondistended no palpable mass or hepatosplenomegaly bowel sounds are present, Rectal; exam not done, Extremities ;no clubbing cyanosis or edema skin warm and dry, Psych; mood and affect appropriate       Assessment & Plan:  #36 73 year old male with one month history of persistent daily nausea of unclear etiology. This has been  associated with heartburn so possibility of erosive gastropathy or peptic ulcer disease. #2 recent pneumonia #3 atrial fibrillation #4 chronic anticoagulation on  pradaxa #5 sleep apnea #6 spinal stenosis-narcotic dependent #7 coronary artery disease  Plan; switch PPI to Zegerid 40 mg by mouth every morning-samples given, no prescription as yet Trial of Phenergan 12.5-25 mg every 6 hours when necessary for nausea patient was warned of potential  drowsiness. Bland diet Schedule for upper endoscopy with Dr. Hilarie Fredrickson. Procedure was discussed in detail with patient and his family and they are agreeable to proceed. Patient will need to hold Pradaxa for 3 days prior to EGD. Will obtain consent from Dr. Jenkins Rouge his cardiologist. If the EGD is unrevealing will need  CT scan of the abdomen and pelvis with contrast.   Addendum: Reviewed and agree with initial management. Jerene Bears, MD

## 2013-04-12 NOTE — Patient Instructions (Signed)
You have been scheduled for an endoscopy with propofol. Please follow written instructions given to you at your visit today. If you use inhalers (even only as needed), please bring them with you on the day of your procedure.  We sent a prescription to [facility] [address] for the Phenergan. We have given you samples of Zegerid 40 mg. Take 1 tab before breakfast daily.

## 2013-04-13 ENCOUNTER — Encounter: Payer: Self-pay | Admitting: Internal Medicine

## 2013-04-14 ENCOUNTER — Telehealth: Payer: Self-pay

## 2013-04-14 ENCOUNTER — Telehealth: Payer: Self-pay | Admitting: *Deleted

## 2013-04-14 ENCOUNTER — Telehealth: Payer: Self-pay | Admitting: Internal Medicine

## 2013-04-14 NOTE — Telephone Encounter (Signed)
Spoke with patient and he had not tried the Phenergan for nausea. He will try this and call back if it does not help.

## 2013-04-14 NOTE — Telephone Encounter (Signed)
Patient called requesting a refill on his Morphine. It looks like it was D/C at his last OV. Please advise.

## 2013-04-14 NOTE — Telephone Encounter (Signed)
I left a message for the patient advising he should stop the Pradaxa on 2-28, 3-1, and 3-2.  I asked him to call us if he has any questions.  Dr Einar Gip said if no biopsies done he can start the same day on 3-2 .

## 2013-04-16 ENCOUNTER — Telehealth: Payer: Self-pay | Admitting: *Deleted

## 2013-04-16 ENCOUNTER — Other Ambulatory Visit: Payer: Self-pay | Admitting: *Deleted

## 2013-04-16 MED ORDER — MORPHINE SULFATE ER 30 MG PO TBCR: 30.0000 mg | EXTENDED_RELEASE_TABLET | Freq: Three times a day (TID) | ORAL | Status: DC

## 2013-04-16 NOTE — Telephone Encounter (Signed)
Ok thanks 

## 2013-04-16 NOTE — Telephone Encounter (Signed)
Called patient to advise I called [facility] to do a prior authorization on the Phenergan 25 mg. They are forwarding this for a clinical review and will contact me by fax in 72 hours, more than likely will be Mon or Tues. I LM that I will let him know when I hear something.

## 2013-04-16 NOTE — Telephone Encounter (Signed)
I called and spoke with [name] about his medication.  [name] thought that the medication was discontinued at his last visit 03/23/13, but it was not.  You refilled it and I see that on the [place] that it was filled 03/23/13.  This should last through 04/20/13 because of the 28 day month in February.  He says his daughter does his medication and he THOUGHT she said he was out, so there was some confusion there.  But, his next appt with you is on 04/23/13 which is too long.  I am taking him off your schedule and putting him on my schedule for med refill on Monday 04/19/13.

## 2013-04-16 NOTE — Telephone Encounter (Signed)
RX printed early for controlled medication for the visit with RN on 04/19/13 (to be signed by MD) 

## 2013-04-16 NOTE — Telephone Encounter (Signed)
I called the patient one more time and left a message advising him of the Pradaxa instructions for the procedure scheduled for 04-19-2013.  He is to hold the Pradaxa on 2-28, 3-1, 3-2 . He can resume the Pradaxa on 3-2 if no biopsies done.

## 2013-04-17 ENCOUNTER — Emergency Department (HOSPITAL_COMMUNITY)
Admission: EM | Admit: 2013-04-17 | Discharge: 2013-04-18 | Disposition: A | Discharge status: Home / Self Care | Payer: Medicare Other | Attending: Emergency Medicine | Admitting: Emergency Medicine

## 2013-04-17 ENCOUNTER — Encounter (HOSPITAL_COMMUNITY): Payer: Self-pay | Admitting: Emergency Medicine

## 2013-04-17 DIAGNOSIS — Z79899 Other long term (current) drug therapy: Secondary | ICD-10-CM | POA: Insufficient documentation

## 2013-04-17 DIAGNOSIS — G8929 Other chronic pain: Secondary | ICD-10-CM

## 2013-04-17 DIAGNOSIS — M549 Dorsalgia, unspecified: Secondary | ICD-10-CM

## 2013-04-17 DIAGNOSIS — J45909 Unspecified asthma, uncomplicated: Secondary | ICD-10-CM | POA: Insufficient documentation

## 2013-04-17 DIAGNOSIS — J020 Streptococcal pharyngitis: Secondary | ICD-10-CM | POA: Insufficient documentation

## 2013-04-17 LAB — URINALYSIS, ROUTINE W REFLEX MICROSCOPIC
BILIRUBIN URINE: NEGATIVE
Glucose, UA: NEGATIVE mg/dL
Hgb urine dipstick: NEGATIVE
KETONES UR: NEGATIVE mg/dL
Leukocytes, UA: NEGATIVE
Nitrite: NEGATIVE
PH: 7.5 (ref 5.0–8.0)
Protein, ur: 30 mg/dL — AB
Specific Gravity, Urine: 1.015 (ref 1.005–1.030)
Urobilinogen, UA: 1 mg/dL (ref 0.0–1.0)

## 2013-04-17 LAB — URINE MICROSCOPIC-ADD ON: Urine-Other: NONE SEEN

## 2013-04-17 MED ORDER — HYDROMORPHONE HCL PF 2 MG/ML IJ SOLN
2.0000 mg | Freq: Once | INTRAMUSCULAR | Status: AC
  Administered 2013-04-17: 2 mg via INTRAMUSCULAR
  Filled 2013-04-17: qty 1

## 2013-04-17 MED ORDER — MORPHINE SULFATE ER 30 MG PO TBCR
30.0000 mg | EXTENDED_RELEASE_TABLET | Freq: Two times a day (BID) | ORAL | Status: DC
  Administered 2013-04-17: 30 mg via ORAL
  Filled 2013-04-17: qty 2

## 2013-04-17 MED ORDER — MORPHINE SULFATE ER 30 MG PO TBCR: 30.0000 mg | EXTENDED_RELEASE_TABLET | Freq: Three times a day (TID) | ORAL | Status: DC

## 2013-04-17 NOTE — ED Notes (Signed)
Pt reports that he awoke on 2/27 with increased lower back pain. Reports he has stopped taking his Morphine 2/26. Pt's daughter reports pt has taken more of this medication than prescribed, so he has ran out of his medication. Pt reports he injured his back "a long time ago" and has been on Morphine for "years"

## 2013-04-17 NOTE — ED Provider Notes (Signed)
CSN: [id number]     Arrival date & time 04/17/13  2043 History   First MD Initiated Contact with Patient 04/17/13 2059     Chief Complaint  Patient presents with  . Back Pain   Level V caveat due to uncooperativeness  (Consider location/radiation/quality/duration/timing/severity/associated sxs/prior Treatment) Patient is a 73 y.o. male presenting with back pain. The history is provided by the patient and a relative.  Back Pain Associated symptoms: weakness   Associated symptoms: no headaches    patient with acute on chronic back pain. Did seen at the pain clinic. He is reportedly been taking his medicines more frequently do to back pain and somewhat some abdominal pain. He has run out of his medications early. He is on 30 mg of morphine 3 times a day. He is a somewhat poor historian due to pain and all he'll tell me is that his back hurts. Family member states he has had several different falls over the last months. There may be slightly increased confusion. He has a followup appointment with his pain Dr. in 2 days. He denies dysuria or urinary problems.  Past Medical History  Diagnosis Date  . Hypertension   . Hyperlipidemia   . Preoperative cardiovascular examination   . Spinal stenosis, lumbar   . CAS (cerebral atherosclerosis)   . Erectile dysfunction   . Preventative health care   . Benign prostatic hypertrophy with urinary obstruction   . Family history of early CAD     male 1st degree relative <50  . Peptic ulcer disease   . Low back pain   . Superficial spreading melanoma   . Sleep apnea, obstructive     CPAP-does not use  . HA (headache)     sinus headaches  . Arthritis     knees  . Myocardial infarction 1997    Hx MI 1997 and 1998  . Anxiety   . Pneumonia 2005  . Anemia   . Depression     takes Zoloft  . Neuropathy, autonomic, idiopathic peripheral, other   . Blood transfusion 1 yr. ago    jerking,fever-after blood transfusion  . Blood transfusion     given  wrong blood  . Coronary artery disease 1998    cardiac stent/ Clearance Dr Arnoldo Morale and Einar Gip with note on chart  . Dysrhythmia     hx of atrial fib per ov note of 12/12   . Bradycardia     hx of bradycardia in past per ov note of 12/12  . GERD (gastroesophageal reflux disease)    Past Surgical History  Procedure Laterality Date  . Esophagogastroduodenoscopy  2004  . Colonoscopy  2004  . Lumbar laminectomy    . Lumbar fusion    . Knee arthroscopy      left  . Shoulder arthroscopy      right  . Knee arthroscopy      right  . Decompression of the median nerve      left wrist and hand  . Revision of tkr  2011    on left  . Coronary stent placement    . Back surgery  2005-last    lumbar x2  . Excisional total knee arthroplasty  12/25/2010    Procedure: EXCISIONAL TOTAL KNEE ARTHROPLASTY;  Surgeon: Mauri Pole;  Location: [facility];  Service: Orthopedics;  Laterality: Right;  repeat irrigation   and debridement, removal and reinsertation of spacer block  . Joint replacement  2011    left knee x  2 ; 08/2010-right knee-infected  . I&d extremity  03/25/2011    Procedure: IRRIGATION AND DEBRIDEMENT EXTREMITY;  Surgeon: Mauri Pole, MD;  Location: [facility];  Service: Orthopedics;  Laterality: Right;  . Coronary angioplasty  [place]   . Total knee revision  05/13/2011    Procedure: TOTAL KNEE REVISION;  Surgeon: Mauri Pole, MD;  Location: [facility];  Service: Orthopedics;  Laterality: Right;  Reimplantation   Family History  Problem Relation Age of Onset  . Heart disease Father   . Heart disease Mother   . Diabetes Sister   . Heart disease Brother    History  Substance Use Topics  . Smoking status: Former Smoker -- 1.00 packs/day for 15 years    Types: Cigars    Quit date: 12/23/2009  . Smokeless tobacco: Never Used     Comment: smoked a pipe for 6-7 y ears  . Alcohol Use: No    Review of Systems  Unable to perform ROS Musculoskeletal: Positive for back pain.   Neurological: Positive for weakness. Negative for headaches.  Psychiatric/Behavioral: Negative for confusion.      Allergies  Acetaminophen  Home Medications   Current Outpatient Rx  Name  Route  Sig  Dispense  Refill  . dabigatran (PRADAXA) 150 MG CAPS capsule   Oral   Take 150 mg by mouth 2 (two) times daily.         . methocarbamol (ROBAXIN) 500 MG tablet   Oral   Take 1 tablet (500 mg total) by mouth 3 (three) times daily.   90 tablet   0   . metoprolol succinate (TOPROL-XL) 100 MG 24 hr tablet   Oral   Take 100 mg by mouth every morning.          Marland Kitchen omeprazole (PRILOSEC) 20 MG capsule   Oral   Take 1 capsule (20 mg total) by mouth daily.   30 capsule   11   . promethazine (PHENERGAN) 25 MG tablet   Oral   Take 12.5-25 mg by mouth every 8 (eight) hours as needed for nausea or vomiting.         Marland Kitchen morphine (MS CONTIN) 30 MG 12 hr tablet   Oral   Take 1 tablet (30 mg total) by mouth 3 (three) times daily.   4 tablet   0   . NITROSTAT 0.4 MG SL tablet   Sublingual   Place 0.4 mg under the tongue every 5 (five) minutes as needed for chest pain. q 5 minutes x 3          BP 169/114  Pulse 84  Temp(Src) 98.3 F (36.8 C) (Oral)  SpO2 99% Physical Exam  Nursing note and vitals reviewed. Constitutional: He appears well-developed.  Eyes: Pupils are equal, round, and reactive to light.  Neck: Normal range of motion.  Cardiovascular: Normal rate and regular rhythm.   Hypertensive  Pulmonary/Chest: Effort normal and breath sounds normal.  Abdominal: There is no tenderness.  Musculoskeletal: He exhibits tenderness.  Midline surgical scar over lumbar spine. Mild tenderness in this area.    ED Course  Procedures (including critical care time) Labs Review Labs Reviewed  URINALYSIS, ROUTINE W REFLEX MICROSCOPIC - Abnormal; Notable for the following:    Protein, ur 30 (*)    All other components within normal limits  URINE MICROSCOPIC-ADD ON    Imaging Review No results found.   EKG Interpretation None      MDM  Final diagnoses:  Chronic back pain    Patient with acute on chronic back pain. Has been worsening and has been taking extra pain medicines. He sees pain management and will see them on Monday. He is ran out of his MS Contin early. He has had increasing falls. No trauma to head. He has had some increased confusion, but appears to be going on for a while now. Urinalysis is reassuring. He does not appear to need acute placement, however he will probably need more evaluation for this. This can likely be done by either his primary care doctor or rehabilitation/pain medicine on Monday. I will refill his chronic pain medicines and given 4 pills should last him until his appointment in 2 days.    Jasper Riling. Alvino Chapel, MD 04/17/13 [phone]

## 2013-04-17 NOTE — Discharge Instructions (Signed)
Chronic Back Pain   When back pain lasts longer than 3 months, it is called chronic back pain.People with chronic back pain often go through certain periods that are more intense (flare-ups).   CAUSES  Chronic back pain can be caused by wear and tear (degeneration) on different structures in your back. These structures include:   The bones of your spine (vertebrae) and the joints surrounding your spinal cord and nerve roots (facets).   The strong, fibrous tissues that connect your vertebrae (ligaments).  Degeneration of these structures may result in pressure on your nerves. This can lead to constant pain.  HOME CARE INSTRUCTIONS   Avoid bending, heavy lifting, prolonged sitting, and activities which make the problem worse.   Take brief periods of rest throughout the day to reduce your pain. Lying down or standing usually is better than sitting while you are resting.   Take over-the-counter or prescription medicines only as directed by your caregiver.  SEEK IMMEDIATE MEDICAL CARE IF:    You have weakness or numbness in one of your legs or feet.   You have trouble controlling your bladder or bowels.   You have nausea, vomiting, abdominal pain, shortness of breath, or fainting.  Document Released: 03/14/2004 Document Revised: 04/29/2011 Document Reviewed: 01/19/2011  ExitCare Patient Information 2014 [employer].

## 2013-04-17 NOTE — ED Notes (Signed)
Patient is alert and oriented x3.  He was given DC instructions and follow up visit instructions.  Patient gave verbal understanding.  He was DC ambulatory under his own power to home.  V/S stable.  He was not showing any signs of distress on DC 

## 2013-04-19 ENCOUNTER — Other Ambulatory Visit: Payer: Self-pay

## 2013-04-19 ENCOUNTER — Ambulatory Visit (AMBULATORY_SURGERY_CENTER): Payer: Medicare Other | Admitting: Internal Medicine

## 2013-04-19 ENCOUNTER — Encounter: Payer: Medicare Other | Admitting: *Deleted

## 2013-04-19 ENCOUNTER — Telehealth: Payer: Self-pay | Admitting: *Deleted

## 2013-04-19 ENCOUNTER — Encounter: Payer: Self-pay | Admitting: Internal Medicine

## 2013-04-19 VITALS — BP 121/65 | HR 96 | Temp 96.7°F | Resp 16 | Ht 69.0 in | Wt 189.0 lb

## 2013-04-19 DIAGNOSIS — K2270 Barrett's esophagus without dysplasia: Secondary | ICD-10-CM

## 2013-04-19 DIAGNOSIS — K2960 Other gastritis without bleeding: Secondary | ICD-10-CM

## 2013-04-19 DIAGNOSIS — R110 Nausea: Secondary | ICD-10-CM

## 2013-04-19 DIAGNOSIS — K2970 Gastritis, unspecified, without bleeding: Secondary | ICD-10-CM

## 2013-04-19 DIAGNOSIS — K2990 Gastroduodenitis, unspecified, without bleeding: Secondary | ICD-10-CM

## 2013-04-19 MED ORDER — OMEPRAZOLE-SODIUM BICARBONATE 40-1100 MG PO CAPS: 1.0000 | ORAL_CAPSULE | Freq: Every day | ORAL | Status: AC

## 2013-04-19 MED ORDER — SODIUM CHLORIDE 0.9 % IV SOLN: 500.0000 mL | INTRAVENOUS | Status: DC

## 2013-04-19 NOTE — Telephone Encounter (Signed)
Called patient and left him a message. I advised him I did  Prior authorization for the promethazine for nausea.  [employer] [facility] denied coverage.  I let the patient know and told him if he needs to speak to me he can call.  He had his procedure this am with Dr. Hilarie Fredrickson.

## 2013-04-19 NOTE — Patient Instructions (Addendum)
YOU HAD AN ENDOSCOPIC PROCEDURE TODAY AT [facility]: Refer to the procedure report that was given to you for any specific questions about what was found during the examination.  If the procedure report does not answer your questions, please call your gastroenterologist to clarify.  If you requested that your care partner not be given the details of your procedure findings, then the procedure report has been included in a sealed envelope for you to review at your convenience later.  YOU SHOULD EXPECT: Some feelings of bloating in the abdomen. Passage of more gas than usual.  Walking can help get rid of the air that was put into your GI tract during the procedure and reduce the bloating.   DIET: Your first meal following the procedure should be a light meal and then it is ok to progress to your normal diet.  A half-sandwich or bowl of soup is an example of a good first meal.  Heavy or fried foods are harder to digest and may make you feel nauseous or bloated.  Likewise meals heavy in dairy and vegetables can cause extra gas to form and this can also increase the bloating.  Drink plenty of fluids but you should avoid alcoholic beverages for 24 hours.  ACTIVITY: Your care partner should take you home directly after the procedure.  You should plan to take it easy, moving slowly for the rest of the day.  You can resume normal activity the day after the procedure however you should NOT DRIVE or use heavy machinery for 24 hours (because of the sedation medicines used during the test).    SYMPTOMS TO REPORT IMMEDIATELY: A gastroenterologist can be reached at any hour.  During normal business hours, 8:30 AM to 5:00 PM Monday through Friday, call [phone].  After hours and on weekends, please call the GI answering service at [phone] who will take a message and have the physician on call contact you.   Following upper endoscopy (EGD)  Vomiting of blood or coffee ground material  New  chest pain or pain under the shoulder blades  Painful or persistently difficult swallowing  New shortness of breath  Fever of 100F or higher  Black, tarry-looking stools  FOLLOW UP: If any biopsies were taken you will be contacted by phone or by letter within the next 1-3 weeks.  Call your gastroenterologist if you have not heard about the biopsies in 3 weeks.  Our staff will call the home number listed on your records the next business day following your procedure to check on you and address any questions or concerns that you may have at that time regarding the information given to you following your procedure. This is a courtesy call and so if there is no answer at the home number and we have not heard from you through the emergency physician on call, we will assume that you have returned to your regular daily activities without incident.  SIGNATURES/CONFIDENTIALITY: You and/or your care partner have signed paperwork which will be entered into your electronic medical record.  These signatures attest to the fact that that the information above on your After Visit Summary has been reviewed and is understood.  Full responsibility of the confidentiality of this discharge information lies with you and/or your care-partner.   Ok to start Pradaxa with this evening's dose  Await pathology results  Dr. Vena Rua office nurse will call you to set up an office appointment for 4-6 weeks  Please take your  Zegerid 20 to 30 minutes before your first meal of the day- your prescription was sent to [facility] on [address]

## 2013-04-19 NOTE — Progress Notes (Signed)
Procedure ends, to recovery, report given and VSS. 

## 2013-04-19 NOTE — Op Note (Signed)
[facility]  [address] [place], [address]   ENDOSCOPY PROCEDURE REPORT  PATIENT: [name], [name]  MR#: [id number] BIRTHDATE: [birth year] , 72  yrs. old GENDER: Male ENDOSCOPIST: Jerene Bears, MD REFERRED BY:  Tamsen Roers, M.D.  Amy Esterwood, PA-C PROCEDURE DATE:  04/19/2013 PROCEDURE:  EGD w/ biopsy ASA CLASS:     Class III INDICATIONS:  Nausea.   Heartburn. MEDICATIONS: MAC sedation, administered by CRNA and Propofol (Diprivan) 160 mg IV TOPICAL ANESTHETIC: none  DESCRIPTION OF PROCEDURE: After the risks benefits and alternatives of the procedure were thoroughly explained, informed consent was obtained.  The LB [id number] endoscope was introduced through the mouth and advanced to the second portion of the duodenum. Without limitations.  The instrument was slowly withdrawn as the mucosa was fully examined.     ESOPHAGUS: There was evidence of suspected Barrett's esophagus beginning 26 cm from the incisors and extending to the top of the gastric folds at 36 cm from the incisor.  Multiple biopsies were performed using cold forceps.  Sample obtained to rule out Barrett's esophagus.  At 30 cm from the incisors there was a slightly raised patch of lighter-colored mucosa, which was biopsied and placed in a separate jar.   A 4 cm hiatal hernia was noted (36 cm to 40 cm from the incisors)  STOMACH: Moderate gastritis (inflammation) was found in the gastric antrum and at the pylorus.  The pylorus was patulous with what appeared to be a nearly completely healed ulceration.  Biopsies were taken in the antrum and angularis.  DUODENUM: The duodenal mucosa showed no abnormalities in the bulb and second portion of the duodenum.  Retroflexed views revealed a hiatal hernia.     The scope was then withdrawn from the patient and the procedure completed.  COMPLICATIONS: There were no complications.  ENDOSCOPIC IMPRESSION: 1.   There was evidence of  suspected Barrett's esophagus; multiple biopsies 2.   4 cm hiatal hernia 3.   Gastritis (inflammation) was found in the gastric antrum and at the pylorus; biopsies were taken in the antrum and angularis. Patulous pylorus consistent with previous ulcer disease 4.   The duodenal mucosa showed no abnormalities in the bulb and second portion of the duodenum  RECOMMENDATIONS: 1.  Await biopsy results 2.  Follow-up of helicobacter pylori status, treat if indicated 3.  Continue taking your PPI (antiacid medicine) once daily.  It is best to be taken 20-30 minutes prior to breakfast meal. 4.  Office follow-up in about 4-6 weeks  REPEAT EXAM: for EGD pending biopsy results (if positive for Barrett's will need circumferential biopsies every 2 cm).  eSigned:  Jerene Bears, MD 04/19/2013 9:20 AM   CC:The Patient and Tamsen Roers, MD  PATIENT NAME:  [name], [name] MR#: [id number]

## 2013-04-19 NOTE — Progress Notes (Signed)
Called to room to assist during endoscopic procedure.  Patient ID and intended procedure confirmed with present staff. Received instructions for my participation in the procedure from the performing physician.  

## 2013-04-20 ENCOUNTER — Telehealth: Payer: Self-pay | Admitting: *Deleted

## 2013-04-20 NOTE — Telephone Encounter (Signed)
  Follow up Call-  Call back number 04/19/2013  Post procedure Call Back phone  # [phone]  Permission to leave phone message Yes     Patient questions:  Do you have a fever, pain , or abdominal swelling? no Pain Score  0 *  Have you tolerated food without any problems? yes  Have you been able to return to your normal activities? yes  Do you have any questions about your discharge instructions: Diet   no Medications  no Follow up visit  no  Do you have questions or concerns about your Care? no  Actions: * If pain score is 4 or above: No action needed, pain <4.

## 2013-04-23 ENCOUNTER — Encounter: Payer: Self-pay | Admitting: Internal Medicine

## 2013-04-23 ENCOUNTER — Other Ambulatory Visit: Payer: Self-pay | Admitting: Internal Medicine

## 2013-04-23 ENCOUNTER — Ambulatory Visit: Payer: Medicare Other | Admitting: Physical Medicine & Rehabilitation

## 2013-04-27 ENCOUNTER — Ambulatory Visit: Payer: Medicare Other | Admitting: Internal Medicine

## 2013-04-28 ENCOUNTER — Telehealth: Payer: Self-pay | Admitting: Gastroenterology

## 2013-04-28 NOTE — Telephone Encounter (Signed)
Started prior authorization for Omeprazole with [employer] [facility]

## 2013-05-03 NOTE — Telephone Encounter (Signed)
I called the pharmacist at [facility] and I asked if [facility] got in touch with them about the phenergan 25 mg.  The pharmacist said the patient said he didn't want to wait for the Prior authorization and paid cash for the medication.

## 2013-05-17 ENCOUNTER — Telehealth: Payer: Self-pay

## 2013-05-17 MED ORDER — MORPHINE SULFATE ER 30 MG PO TBCR: 30.0000 mg | EXTENDED_RELEASE_TABLET | Freq: Three times a day (TID) | ORAL | Status: DC

## 2013-05-17 NOTE — Telephone Encounter (Signed)
Patient daughter called requesting morphine refill.

## 2013-05-17 NOTE — Telephone Encounter (Signed)
Patient daughter Sharyn Lull aware morphine rx is ready for pick up.

## 2013-05-17 NOTE — Telephone Encounter (Signed)
Morphine rx printed for [name] to sign.  Will contact patient daughter when ready.

## 2013-05-24 ENCOUNTER — Telehealth: Payer: Self-pay | Admitting: Internal Medicine

## 2013-05-24 ENCOUNTER — Encounter: Payer: Self-pay | Admitting: Internal Medicine

## 2013-05-24 NOTE — Telephone Encounter (Signed)
Patient is set up for repeat EGD 06/30/13 and office visit is not needed.  I will cancel the appt.  Family is aware

## 2013-05-25 ENCOUNTER — Encounter: Payer: Self-pay | Admitting: Physical Medicine & Rehabilitation

## 2013-05-25 ENCOUNTER — Encounter: Payer: Medicare Other | Attending: Physical Medicine & Rehabilitation

## 2013-05-25 ENCOUNTER — Ambulatory Visit (HOSPITAL_BASED_OUTPATIENT_CLINIC_OR_DEPARTMENT_OTHER): Payer: Medicare Other | Admitting: Physical Medicine & Rehabilitation

## 2013-05-25 ENCOUNTER — Telehealth: Payer: Self-pay

## 2013-05-25 VITALS — BP 141/76 | HR 82 | Resp 16 | Ht 69.0 in | Wt 172.0 lb

## 2013-05-25 DIAGNOSIS — Z96659 Presence of unspecified artificial knee joint: Secondary | ICD-10-CM | POA: Insufficient documentation

## 2013-05-25 DIAGNOSIS — M79609 Pain in unspecified limb: Secondary | ICD-10-CM | POA: Insufficient documentation

## 2013-05-25 DIAGNOSIS — M216X9 Other acquired deformities of unspecified foot: Secondary | ICD-10-CM

## 2013-05-25 DIAGNOSIS — M21371 Foot drop, right foot: Secondary | ICD-10-CM

## 2013-05-25 DIAGNOSIS — F1920 Other psychoactive substance dependence, uncomplicated: Secondary | ICD-10-CM | POA: Insufficient documentation

## 2013-05-25 DIAGNOSIS — R2689 Other abnormalities of gait and mobility: Secondary | ICD-10-CM

## 2013-05-25 DIAGNOSIS — G609 Hereditary and idiopathic neuropathy, unspecified: Secondary | ICD-10-CM | POA: Insufficient documentation

## 2013-05-25 DIAGNOSIS — Z79899 Other long term (current) drug therapy: Secondary | ICD-10-CM | POA: Insufficient documentation

## 2013-05-25 DIAGNOSIS — G8929 Other chronic pain: Secondary | ICD-10-CM | POA: Insufficient documentation

## 2013-05-25 DIAGNOSIS — M47817 Spondylosis without myelopathy or radiculopathy, lumbosacral region: Secondary | ICD-10-CM | POA: Insufficient documentation

## 2013-05-25 DIAGNOSIS — M545 Low back pain, unspecified: Secondary | ICD-10-CM | POA: Insufficient documentation

## 2013-05-25 DIAGNOSIS — M21372 Foot drop, left foot: Secondary | ICD-10-CM

## 2013-05-25 DIAGNOSIS — M961 Postlaminectomy syndrome, not elsewhere classified: Secondary | ICD-10-CM | POA: Insufficient documentation

## 2013-05-25 DIAGNOSIS — R269 Unspecified abnormalities of gait and mobility: Secondary | ICD-10-CM

## 2013-05-25 DIAGNOSIS — M533 Sacrococcygeal disorders, not elsewhere classified: Secondary | ICD-10-CM

## 2013-05-25 NOTE — Patient Instructions (Signed)
Patient will not receive any further narcotic analgesics in this office.  We can perform injections  Will order physical therapy  [facility] Referral

## 2013-05-25 NOTE — Telephone Encounter (Signed)
[facility] to make patient appointment per Dr Letta Pate.  He is going to see them 05/31/13 at 10am.  Patient and family aware.

## 2013-05-25 NOTE — Progress Notes (Signed)
Subjective:    Patient ID: [name], male    DOB: Jul 28, 1940, 73 y.o.   MRN: [id number]  HPI 73 year old male with chronic spinal stenosis and bilateral foot drop returns to clinic today Last visit with me to 03/22/2013  He feels his pain medication and morphine extended release 30 mg twice a days helpful for him. He had a bilateral sacroiliac injection 10/22/2012 which relieved about 50% of his pain in the low back area. He had a previous L3 L4-L5 medial branch block in June of 2014 which also was quite helpful for his pain.  He is accompanied by his family today including his daughter and his wife. The daughter gives a history of patient running out early on his pain medications because he takes excessive amounts. He then goes into morphine withdrawl and insists that the family take him to the emergency department for a narcotic injection. Review of Epic chart reveals that this is what happened during the emergency department visit 04/17/2013.  She states that this has been going on for several months. She states that this has been disrupting there family life. Patient is minimizing this and doesn't think it's a big problem.   Fell at home catches toe on tile floor.  Past surgical history L2-3 and L3-4 laminectomy and decompression Pain Inventory Average Pain 5 Pain Right Now na My pain is dull  In the last 24 hours, has pain interfered with the following? General activity 4 Relation with others 4 Enjoyment of life 4 What TIME of day is your pain at its worst? night Sleep (in general) Fair  Pain is worse with: standing Pain improves with: medication Relief from Meds: 7  Mobility use a cane use a walker how many minutes can you walk? 5 ability to climb steps?  yes do you drive?  yes  Function Do you have any goals in this area?  no  Neuro/Psych bladder control problems weakness trouble walking spasms depression anxiety  Prior Studies Any changes since last  visit?  no  Physicians involved in your care Any changes since last visit?  no   Family History  Problem Relation Age of Onset  . Heart disease Father   . Heart disease Mother   . Diabetes Sister   . Heart disease Brother    History   Social History  . Marital Status: Married    Spouse Name: N/A    Number of Children: N/A  . Years of Education: N/A   Occupational History  . diesel repair     part time  . worked at [employer]  . Smoking status: Former Smoker -- 1.00 packs/day for 15 years    Types: Cigars    Quit date: 12/23/2009  . Smokeless tobacco: Never Used     Comment: smoked a pipe for 6-7 y ears  . Alcohol Use: No  . Drug Use: No  . Sexual Activity: None   Other Topics Concern  . None   Social History Narrative  . None   Past Surgical History  Procedure Laterality Date  . Esophagogastroduodenoscopy  2004  . Colonoscopy  2004  . Lumbar laminectomy    . Lumbar fusion    . Knee arthroscopy      left  . Shoulder arthroscopy      right  . Knee arthroscopy      right  . Decompression of the median nerve      left wrist  and hand  . Revision of tkr  2011    on left  . Coronary stent placement    . Back surgery  2005-last    lumbar x2  . Excisional total knee arthroplasty  12/25/2010    Procedure: EXCISIONAL TOTAL KNEE ARTHROPLASTY;  Surgeon: Mauri Pole;  Location: [facility];  Service: Orthopedics;  Laterality: Right;  repeat irrigation   and debridement, removal and reinsertation of spacer block  . Joint replacement  2011    left knee x 2 ; 08/2010-right knee-infected  . I&d extremity  03/25/2011    Procedure: IRRIGATION AND DEBRIDEMENT EXTREMITY;  Surgeon: Mauri Pole, MD;  Location: [facility];  Service: Orthopedics;  Laterality: Right;  . Coronary angioplasty  [place]   . Total knee revision  05/13/2011    Procedure: TOTAL KNEE REVISION;  Surgeon: Mauri Pole, MD;  Location: [facility];  Service: Orthopedics;   Laterality: Right;  Reimplantation   Past Medical History  Diagnosis Date  . Hypertension   . Hyperlipidemia   . Preoperative cardiovascular examination   . Spinal stenosis, lumbar   . CAS (cerebral atherosclerosis)   . Erectile dysfunction   . Preventative health care   . Benign prostatic hypertrophy with urinary obstruction   . Family history of early CAD     male 1st degree relative <50  . Peptic ulcer disease   . Low back pain   . Superficial spreading melanoma   . Sleep apnea, obstructive     CPAP-does not use  . HA (headache)     sinus headaches  . Arthritis     knees  . Myocardial infarction 1997    Hx MI 1997 and 1998  . Anxiety   . Pneumonia 2005  . Anemia   . Depression     takes Zoloft  . Neuropathy, autonomic, idiopathic peripheral, other   . Blood transfusion 1 yr. ago    jerking,fever-after blood transfusion  . Blood transfusion     given wrong blood  . Coronary artery disease 1998    cardiac stent/ Clearance Dr Arnoldo Morale and Einar Gip with note on chart  . Dysrhythmia     hx of atrial fib per ov note of 12/12   . Bradycardia     hx of bradycardia in past per ov note of 12/12  . GERD (gastroesophageal reflux disease)    BP 141/76  Pulse 82  Resp 16  Ht 5\' 9"  (1.753 m)  Wt 172 lb (78.019 kg)  BMI 25.39 kg/m2  SpO2 97%  Opioid Risk Score:   Fall Risk Score:     Review of Systems  Genitourinary: Positive for difficulty urinating.  Musculoskeletal: Positive for gait problem.  Neurological: Positive for weakness.  Psychiatric/Behavioral: Positive for dysphoric mood and agitation.  All other systems reviewed and are negative.       Objective:   Physical Exam  Lumbar spine has no tenderness to palpation Tenderness at PSIS area bilaterally Motor strength is 4/5 bilateral hip flexors and knee extensors trace ankle dorsiflexors bilateral Mood and affect without lability or agitation Deep tendon reflexes absent at bilateral ankles Lumbar range of  motion is reduced Ambulates with foot drop uses a walker     Assessment & Plan:   1. History lumbar spinal stenosis status post decompression with chronic bilateral foot drop. He has been using foot up orthosis however this is not sufficient to control his foot drop any longer. I feel like  he needs standard ankle-foot orthoses and I have made a referral with prescription to orthotist 2. Chronic pain syndrome with history of narcotic analgesic abuse which has just come to my attention because of family intervention. I do not feel comfortable prescribing narcotic analgesics of the situation and have made a referral [facility] for narcotic weaning  I have made a referral to physical therapy for balance disorder which is multifactorial I will repeat bilateral sacroiliac injections which the patient states were helpful last year We may also need to repeat lumbar medial branch blocks.  Discuss with patient agrees with plan Over half of the 25 min visit was spent counseling and coordinating care.

## 2013-05-27 ENCOUNTER — Telehealth: Payer: Self-pay | Admitting: Internal Medicine

## 2013-05-27 NOTE — Telephone Encounter (Signed)
Patient Information:  Caller Name: [name]  Phone: [phone]  Patient: [name], [name]  Gender: Male  DOB: 10-09-1940  Age: 73 Years  PCP: Benay Pillow (Adults only)  Office Follow Up:  Does the office need to follow up with this patient?: No  Instructions For The Office: N/A   Symptoms  Reason For Call & Symptoms: Chest tightness started 05/26/13 AM and SOB started then also.  It is constant.  Reviewed Health History In EMR: Yes  Reviewed Medications In EMR: Yes  Reviewed Allergies In EMR: Yes  Reviewed Surgeries / Procedures: Yes  Date of Onset of Symptoms: 05/26/2013  Guideline(s) Used:  Chest Pain  Disposition Per Guideline:   Call EMS 911 Now  Reason For Disposition Reached:   Chest pain lasting longer than 5 minutes and ANY of the following:  Over 16 years old Over 70 years old and at least one cardiac risk factor (i.e., high blood pressure, diabetes, high cholesterol, obesity, smoker or strong family history of heart disease) Pain is crushing, pressure-like, or heavy  Took nitroglycerin and chest pain was not relieved History of heart disease (i.e., angina, heart attack, bypass surgery, angioplasty, CHF)  Advice Given:  N/A  RN Overrode Recommendation:  Document Patient  Pt called this office and after triage stated that he was trying to get an appt with Dr. Tamsen Roers this AM at 1015.   This office very kindly gave me the phone # to Dr. Eddie Dibbles office and I called to let them know that I had triaged him and instructed him to go to the ED by 911.  I spoke with Melissa  at Dr. Unk Pinto office and and she stated he did have an appt., but with these SX they should have told him to go ahead to the ED.  She will inform their staff.

## 2013-05-27 NOTE — Telephone Encounter (Signed)
lmtcb

## 2013-05-28 ENCOUNTER — Ambulatory Visit: Payer: Medicare Other | Admitting: Internal Medicine

## 2013-05-28 NOTE — Telephone Encounter (Signed)
Spoke with pt's wife, she stated that he had no more complaints of chest pain, and that she thought it was indigestion.  I instructed her to have him call the office if he had any more chest pain

## 2013-05-31 ENCOUNTER — Telehealth: Payer: Self-pay

## 2013-05-31 DIAGNOSIS — M533 Sacrococcygeal disorders, not elsewhere classified: Secondary | ICD-10-CM

## 2013-05-31 NOTE — Telephone Encounter (Signed)
Refer to Dr Lynnda Shields [facility] [place] [id number] [phone]  How many pain pills are left

## 2013-05-31 NOTE — Telephone Encounter (Signed)
Patient daughter called regarding the referral to [facility].  The [facility] told the patient they could not help him.  The patient needs something to manage his pain.  [name] his daughter has called around and she is having trouble figuring out what to do.  Please advise.  She does not want her father to go through withdrawal and end up in the hospital.

## 2013-06-01 NOTE — Telephone Encounter (Signed)
Spoke with patient daughter.  She will call us back to let us know how many pills the patient has left.  I also gave her the [facility] to [facility].  Referral placed in the chart.

## 2013-06-02 NOTE — Telephone Encounter (Signed)
Patient's daughter returned call and said that patient has enough medication to last until 06/15/13 taking TID.

## 2013-06-02 NOTE — Telephone Encounter (Signed)
Daughter said that [facility] needed additional information before patient can be seen. Attempted to contact referral coordinator at [facility]. Left a voicemail to return call to let us know what additional information is needed.

## 2013-06-04 ENCOUNTER — Ambulatory Visit: Payer: Medicare Other | Attending: Physical Medicine & Rehabilitation | Admitting: Physical Therapy

## 2013-06-04 DIAGNOSIS — R262 Difficulty in walking, not elsewhere classified: Secondary | ICD-10-CM | POA: Insufficient documentation

## 2013-06-04 DIAGNOSIS — IMO0001 Reserved for inherently not codable concepts without codable children: Secondary | ICD-10-CM | POA: Insufficient documentation

## 2013-06-04 DIAGNOSIS — R279 Unspecified lack of coordination: Secondary | ICD-10-CM | POA: Insufficient documentation

## 2013-06-04 DIAGNOSIS — R293 Abnormal posture: Secondary | ICD-10-CM | POA: Insufficient documentation

## 2013-06-07 ENCOUNTER — Telehealth: Payer: Self-pay

## 2013-06-07 NOTE — Telephone Encounter (Signed)
Inez Catalina @ [facility] called and said Dr. Vira Blanco wanted to know if Dr. Letta Pate was going to continue injections for the patient, or if [facility] needed to take over all pain mgnt for patient. Please advise.

## 2013-06-08 NOTE — Telephone Encounter (Signed)
[facility] can assume all care

## 2013-06-08 NOTE — Telephone Encounter (Signed)
Left message for [name] at [facility] that they can manage all of the patients needs.

## 2013-06-10 ENCOUNTER — Ambulatory Visit: Payer: Medicare Other

## 2013-06-14 ENCOUNTER — Telehealth: Payer: Self-pay

## 2013-06-14 MED ORDER — MORPHINE SULFATE ER 30 MG PO TBCR: 30.0000 mg | EXTENDED_RELEASE_TABLET | Freq: Three times a day (TID) | ORAL | Status: DC

## 2013-06-14 NOTE — Telephone Encounter (Signed)
MS Contin 30 mg 3 times a day #45 2 week supply

## 2013-06-14 NOTE — Telephone Encounter (Signed)
2 week supply of MS Contin printed.  Family informed of rx.

## 2013-06-14 NOTE — Telephone Encounter (Signed)
Patients daughter called requesting morphine refill.  They are in process of going to another provider but need a refill before then so the patient does not go through withdrawal.

## 2013-06-16 ENCOUNTER — Ambulatory Visit: Payer: Medicare Other | Admitting: Physical Therapy

## 2013-06-17 ENCOUNTER — Encounter: Payer: Medicare Other | Admitting: Physical Therapy

## 2013-06-22 ENCOUNTER — Ambulatory Visit (AMBULATORY_SURGERY_CENTER): Payer: Self-pay

## 2013-06-22 VITALS — Ht 69.0 in | Wt 192.2 lb

## 2013-06-22 DIAGNOSIS — K2270 Barrett's esophagus without dysplasia: Secondary | ICD-10-CM

## 2013-06-23 ENCOUNTER — Telehealth: Payer: Self-pay

## 2013-06-23 MED ORDER — MORPHINE SULFATE ER 30 MG PO TBCR: 30.0000 mg | EXTENDED_RELEASE_TABLET | Freq: Three times a day (TID) | ORAL | Status: DC

## 2013-06-23 NOTE — Telephone Encounter (Signed)
[facility] has appointment at [facility] but will need an rx to get him until the appointment.  The appointment is on the 19th.  He will be out of medication by the 10th.  Please advise.

## 2013-06-23 NOTE — Telephone Encounter (Signed)
[facility] 9 day supply of morphine 30 mg 3 times per day #27

## 2013-06-23 NOTE — Telephone Encounter (Signed)
Morphine bridge rx printed.  Will contact patient when signed.

## 2013-06-24 NOTE — Telephone Encounter (Signed)
Patient daughter aware morphine rx is ready for pick up.

## 2013-06-25 ENCOUNTER — Ambulatory Visit: Payer: Medicare Other | Attending: Physical Medicine & Rehabilitation

## 2013-06-25 DIAGNOSIS — R293 Abnormal posture: Secondary | ICD-10-CM | POA: Insufficient documentation

## 2013-06-25 DIAGNOSIS — IMO0001 Reserved for inherently not codable concepts without codable children: Secondary | ICD-10-CM | POA: Diagnosis not present

## 2013-06-25 DIAGNOSIS — R279 Unspecified lack of coordination: Secondary | ICD-10-CM | POA: Diagnosis not present

## 2013-06-25 DIAGNOSIS — R262 Difficulty in walking, not elsewhere classified: Secondary | ICD-10-CM | POA: Insufficient documentation

## 2013-06-28 ENCOUNTER — Encounter: Payer: Self-pay | Admitting: Internal Medicine

## 2013-06-29 ENCOUNTER — Encounter: Payer: Medicare Other | Attending: Physical Medicine & Rehabilitation

## 2013-06-29 ENCOUNTER — Encounter: Payer: Self-pay | Admitting: Physical Medicine & Rehabilitation

## 2013-06-29 ENCOUNTER — Ambulatory Visit (HOSPITAL_BASED_OUTPATIENT_CLINIC_OR_DEPARTMENT_OTHER): Payer: Medicare Other | Admitting: Physical Medicine & Rehabilitation

## 2013-06-29 ENCOUNTER — Telehealth: Payer: Self-pay

## 2013-06-29 VITALS — BP 134/85 | HR 57 | Resp 14 | Ht 69.0 in | Wt 190.0 lb

## 2013-06-29 DIAGNOSIS — Z96659 Presence of unspecified artificial knee joint: Secondary | ICD-10-CM | POA: Insufficient documentation

## 2013-06-29 DIAGNOSIS — F1920 Other psychoactive substance dependence, uncomplicated: Secondary | ICD-10-CM | POA: Insufficient documentation

## 2013-06-29 DIAGNOSIS — G609 Hereditary and idiopathic neuropathy, unspecified: Secondary | ICD-10-CM | POA: Insufficient documentation

## 2013-06-29 DIAGNOSIS — M961 Postlaminectomy syndrome, not elsewhere classified: Secondary | ICD-10-CM | POA: Insufficient documentation

## 2013-06-29 DIAGNOSIS — G8929 Other chronic pain: Secondary | ICD-10-CM | POA: Insufficient documentation

## 2013-06-29 DIAGNOSIS — M533 Sacrococcygeal disorders, not elsewhere classified: Secondary | ICD-10-CM

## 2013-06-29 DIAGNOSIS — M545 Low back pain, unspecified: Secondary | ICD-10-CM | POA: Insufficient documentation

## 2013-06-29 DIAGNOSIS — M79609 Pain in unspecified limb: Secondary | ICD-10-CM | POA: Insufficient documentation

## 2013-06-29 DIAGNOSIS — M47817 Spondylosis without myelopathy or radiculopathy, lumbosacral region: Secondary | ICD-10-CM | POA: Insufficient documentation

## 2013-06-29 DIAGNOSIS — Z79899 Other long term (current) drug therapy: Secondary | ICD-10-CM | POA: Insufficient documentation

## 2013-06-29 NOTE — Progress Notes (Signed)
  PROCEDURE RECORD [name]   Name: [name] DOB:[birth year] MRN: [id number]  Date:06/29/2013  Physician: Alysia Penna, MD    Nurse/CMA: Shumaker RN  Allergies:  Allergies  Allergen Reactions  . Acetaminophen Other (See Comments)    LIVER INFLAMMATION IN HIGH DOSES    Consent Signed: yes  Is patient diabetic? no  CBG today?  Pregnant: no LMP: No LMP for male patient. (age 75-55)  Anticoagulants: no  Stopped pradaxa permanently last week Anti-inflammatory: no Antibiotics: no  Procedure: sacral steroid injection Position: Prone Start Time: 12:53  End Time: 12:59 Fluoro Time:19 sec  RN/CMA Biomedical engineer    Time 12:27 13:01    BP 134/85 144/73    Pulse 57 51    Respirations 14 14    O2 Sat 98 97    S/S 6 6    Pain Level 4/10 3/10     D/C home with daughter, patient A & O X 3, D/C instructions reviewed, and sits independently.

## 2013-06-29 NOTE — Patient Instructions (Signed)
Followup with [facility] [place] [phone]  They can do further injections if needed as well as manage medications. You have enough morphine until the appointment on 07/06/2013

## 2013-06-29 NOTE — Telephone Encounter (Signed)
Called patient to inquire if he was still on pradaxa prior to EGD tomorrow.  Patient reports that he has not been on it for a week or more.  Has been discontinued.  He is advised that we will see him for his procedure tomorrow

## 2013-06-29 NOTE — Progress Notes (Signed)
Bilateral sacroiliac injections under fluoroscopic guidance  Indication: Low back and buttocks pain not relieved by medication management and other conservative care.  Informed consent was obtained after describing risks and benefits of the procedure with the patient, this includes bleeding, bruising, infection, paralysis and medication side effects. The patient wishes to proceed and has given written consent. The patient was placed in a prone position. The lumbar and sacral area was marked and prepped with Betadine. A 25-gauge 1-1/2 inch needle was inserted into the skin and subcutaneous tissue and 1 mL of 1% lidocaine was injected into each side. Then a 25-gauge 3 inch spinal needle was inserted under fluoroscopic guidance into the left sacroiliac joint. AP and lateral images were utilized. Omnipaque 180x0.5 mL under live fluoroscopy demonstrated no intravascular uptake. Then a solution containing one ML of 40 mg per mL Depakote met drawl in 2 ML of 2% lidocaine MPF was injected x1.5 mL. This same procedure was repeated on the right side using the same needle, injectate, and technique. Patient tolerated the procedure well. Post procedure instructions were given. Please see post procedure form.

## 2013-06-30 ENCOUNTER — Encounter: Payer: Medicare Other | Admitting: Physical Therapy

## 2013-06-30 ENCOUNTER — Encounter: Payer: Self-pay | Admitting: Internal Medicine

## 2013-06-30 ENCOUNTER — Ambulatory Visit (AMBULATORY_SURGERY_CENTER): Payer: Medicare Other | Admitting: Internal Medicine

## 2013-06-30 VITALS — BP 151/89 | HR 59 | Temp 96.8°F | Resp 23 | Ht 69.0 in | Wt 192.0 lb

## 2013-06-30 DIAGNOSIS — K2270 Barrett's esophagus without dysplasia: Secondary | ICD-10-CM

## 2013-06-30 DIAGNOSIS — K2970 Gastritis, unspecified, without bleeding: Secondary | ICD-10-CM

## 2013-06-30 DIAGNOSIS — K2990 Gastroduodenitis, unspecified, without bleeding: Secondary | ICD-10-CM

## 2013-06-30 LAB — GLUCOSE, CAPILLARY: GLUCOSE-CAPILLARY: 118 mg/dL — AB (ref 70–99)

## 2013-06-30 MED ORDER — SODIUM CHLORIDE 0.9 % IV SOLN: 500.0000 mL | INTRAVENOUS | Status: DC

## 2013-06-30 NOTE — Progress Notes (Signed)
A/ox3 pleased with MAC, report to Celia RN 

## 2013-06-30 NOTE — Progress Notes (Signed)
1310- pt states, "I am so nervous.  I just don't feel right."  C/O feeling a little dizzy.  BP- 162/90, pulse 60, sat 97%, accu check 118.    No  C/o chest pain, SOB, or discomfort of any kind.  Curtain pulled open. Pt states he doesn't want to feel "closed in."  Magazine given to pt

## 2013-06-30 NOTE — Progress Notes (Signed)
Called to room to assist during endoscopic procedure.  Patient ID and intended procedure confirmed with present staff. Received instructions for my participation in the procedure from the performing physician.  

## 2013-06-30 NOTE — Patient Instructions (Signed)
Discharge instructions given with verbal understanding. Biopsies taken. Resume previous medications. YOU HAD AN ENDOSCOPIC PROCEDURE TODAY AT THE [facility]: Refer to the procedure report that was given to you for any specific questions about what was found during the examination.  If the procedure report does not answer your questions, please call your gastroenterologist to clarify.  If you requested that your care partner not be given the details of your procedure findings, then the procedure report has been included in a sealed envelope for you to review at your convenience later.  YOU SHOULD EXPECT: Some feelings of bloating in the abdomen. Passage of more gas than usual.  Walking can help get rid of the air that was put into your GI tract during the procedure and reduce the bloating. If you had a lower endoscopy (such as a colonoscopy or flexible sigmoidoscopy) you may notice spotting of blood in your stool or on the toilet paper. If you underwent a bowel prep for your procedure, then you may not have a normal bowel movement for a few days.  DIET: Your first meal following the procedure should be a light meal and then it is ok to progress to your normal diet.  A half-sandwich or bowl of soup is an example of a good first meal.  Heavy or fried foods are harder to digest and may make you feel nauseous or bloated.  Likewise meals heavy in dairy and vegetables can cause extra gas to form and this can also increase the bloating.  Drink plenty of fluids but you should avoid alcoholic beverages for 24 hours.  ACTIVITY: Your care partner should take you home directly after the procedure.  You should plan to take it easy, moving slowly for the rest of the day.  You can resume normal activity the day after the procedure however you should NOT DRIVE or use heavy machinery for 24 hours (because of the sedation medicines used during the test).    SYMPTOMS TO REPORT IMMEDIATELY: A gastroenterologist  can be reached at any hour.  During normal business hours, 8:30 AM to 5:00 PM Monday through Friday, call [phone].  After hours and on weekends, please call the GI answering service at [phone] who will take a message and have the physician on call contact you.   Following upper endoscopy (EGD)  Vomiting of blood or coffee ground material  New chest pain or pain under the shoulder blades  Painful or persistently difficult swallowing  New shortness of breath  Fever of 100F or higher  Black, tarry-looking stools  FOLLOW UP: If any biopsies were taken you will be contacted by phone or by letter within the next 1-3 weeks.  Call your gastroenterologist if you have not heard about the biopsies in 3 weeks.  Our staff will call the home number listed on your records the next business day following your procedure to check on you and address any questions or concerns that you may have at that time regarding the information given to you following your procedure. This is a courtesy call and so if there is no answer at the home number and we have not heard from you through the emergency physician on call, we will assume that you have returned to your regular daily activities without incident.  SIGNATURES/CONFIDENTIALITY: You and/or your care partner have signed paperwork which will be entered into your electronic medical record.  These signatures attest to the fact that that the information above on your After   Visit Summary has been reviewed and is understood.  Full responsibility of the confidentiality of this discharge information lies with you and/or your care-partner. 

## 2013-06-30 NOTE — Op Note (Signed)
[place]  [address] [place], [id number]   ENDOSCOPY PROCEDURE REPORT  PATIENT: [name], Seth  MR#: [id number] BIRTHDATE: [birth year] , 72  yrs. old GENDER: Male ENDOSCOPIST: Jerene Bears, MD PROCEDURE DATE:  06/30/2013 PROCEDURE:  EGD w/ biopsy ASA CLASS:     Class III INDICATIONS:  follow up of Barrett's esophagus, and indeterminate dysplasia from esophageal biopsies March 2015. MEDICATIONS: MAC sedation, administered by CRNA and Propofol (Diprivan) 160 mg IV TOPICAL ANESTHETIC: none  DESCRIPTION OF PROCEDURE: After the risks benefits and alternatives of the procedure were thoroughly explained, informed consent was obtained.  The LB [id number] endoscope was introduced through the mouth and advanced to the second portion of the duodenum. Without limitations.  The instrument was slowly withdrawn as the mucosa was fully examined.  ESOPHAGUS: There was evidence of long segment Barrett's esophagus beginning 26-27 cm from the incisors and extending to 37 cm from the incisors (at the top of the gastric folds)..  Multiple biopsies were performed in 4 quadrant fashion every 2 cm using cold forceps and placed in separate jars.  Sample sent for histology. no raised lesions seen in the segment was examined under narrowband imaging. A 3 cm hiatal hernia was noted.   [place] Criteria (C10, M10)  STOMACH: There was mild antral gastropathy noted and scarring indicative of previous ulcer disease.  DUODENUM: The duodenal mucosa showed no abnormalities in the bulb and second portion of the duodenum.  Retroflexed views revealed a hiatal hernia.     The scope was then withdrawn from the patient and the procedure completed.  COMPLICATIONS: There were no complications. ENDOSCOPIC IMPRESSION:  1.   There was evidence of long segment Barrett's esophagus; multiple 4 quadrant biopsies every 2 cm 2.   3 cm hiatal hernia 3.   There was mild antral gastropathy noted;  previously biopsied 4.   The duodenal mucosa showed no abnormalities in the bulb and second portion of the duodenum  RECOMMENDATIONS: 1.  Await biopsy results 2.  Continue daily PPI (acid-suppressing medication) indefinitely  REPEAT EXAM: for EGD pending biopsy results.  eSigned:  Jerene Bears, MD 06/30/2013 2:11 PM    CC:The Patient and Tamsen Roers, MD

## 2013-07-01 ENCOUNTER — Telehealth: Payer: Self-pay

## 2013-07-01 NOTE — Telephone Encounter (Signed)
  Follow up Call-  Call back number 06/30/2013 04/19/2013  Post procedure Call Back phone  # [phone]  Permission to leave phone message Yes Yes     Patient questions:  Do you have a fever, pain , or abdominal swelling? no Pain Score  0 *  Have you tolerated food without any problems? yes  Have you been able to return to your normal activities? yes  Do you have any questions about your discharge instructions: Diet   no Medications  no Follow up visit  no  Do you have questions or concerns about your Care? no  Actions: * If pain score is 4 or above: No action needed, pain <4.

## 2013-07-05 ENCOUNTER — Encounter: Payer: Medicare Other | Admitting: Physical Therapy

## 2013-07-06 ENCOUNTER — Ambulatory Visit: Payer: Medicare Other

## 2013-07-07 ENCOUNTER — Emergency Department (HOSPITAL_COMMUNITY)
Admission: EM | Admit: 2013-07-07 | Discharge: 2013-07-07 | Disposition: A | Discharge status: Home / Self Care | Payer: Medicare Other | Attending: Emergency Medicine | Admitting: Emergency Medicine

## 2013-07-07 ENCOUNTER — Encounter (HOSPITAL_COMMUNITY): Payer: Self-pay | Admitting: Emergency Medicine

## 2013-07-07 DIAGNOSIS — Z862 Personal history of diseases of the blood and blood-forming organs and certain disorders involving the immune mechanism: Secondary | ICD-10-CM | POA: Insufficient documentation

## 2013-07-07 DIAGNOSIS — I252 Old myocardial infarction: Secondary | ICD-10-CM | POA: Insufficient documentation

## 2013-07-07 DIAGNOSIS — Z8711 Personal history of peptic ulcer disease: Secondary | ICD-10-CM | POA: Insufficient documentation

## 2013-07-07 DIAGNOSIS — Z8669 Personal history of other diseases of the nervous system and sense organs: Secondary | ICD-10-CM | POA: Insufficient documentation

## 2013-07-07 DIAGNOSIS — Z7982 Long term (current) use of aspirin: Secondary | ICD-10-CM | POA: Insufficient documentation

## 2013-07-07 DIAGNOSIS — Z85820 Personal history of malignant melanoma of skin: Secondary | ICD-10-CM | POA: Insufficient documentation

## 2013-07-07 DIAGNOSIS — Z8639 Personal history of other endocrine, nutritional and metabolic disease: Secondary | ICD-10-CM | POA: Insufficient documentation

## 2013-07-07 DIAGNOSIS — F3289 Other specified depressive episodes: Secondary | ICD-10-CM | POA: Insufficient documentation

## 2013-07-07 DIAGNOSIS — Z87448 Personal history of other diseases of urinary system: Secondary | ICD-10-CM | POA: Insufficient documentation

## 2013-07-07 DIAGNOSIS — Z79899 Other long term (current) drug therapy: Secondary | ICD-10-CM | POA: Insufficient documentation

## 2013-07-07 DIAGNOSIS — I10 Essential (primary) hypertension: Secondary | ICD-10-CM | POA: Insufficient documentation

## 2013-07-07 DIAGNOSIS — Z8701 Personal history of pneumonia (recurrent): Secondary | ICD-10-CM | POA: Insufficient documentation

## 2013-07-07 DIAGNOSIS — R110 Nausea: Secondary | ICD-10-CM | POA: Insufficient documentation

## 2013-07-07 DIAGNOSIS — F329 Major depressive disorder, single episode, unspecified: Secondary | ICD-10-CM | POA: Insufficient documentation

## 2013-07-07 DIAGNOSIS — K219 Gastro-esophageal reflux disease without esophagitis: Secondary | ICD-10-CM | POA: Insufficient documentation

## 2013-07-07 DIAGNOSIS — R079 Chest pain, unspecified: Secondary | ICD-10-CM | POA: Insufficient documentation

## 2013-07-07 DIAGNOSIS — Z9861 Coronary angioplasty status: Secondary | ICD-10-CM | POA: Insufficient documentation

## 2013-07-07 DIAGNOSIS — I2510 Atherosclerotic heart disease of native coronary artery without angina pectoris: Secondary | ICD-10-CM | POA: Insufficient documentation

## 2013-07-07 DIAGNOSIS — M1710 Unilateral primary osteoarthritis, unspecified knee: Secondary | ICD-10-CM | POA: Insufficient documentation

## 2013-07-07 DIAGNOSIS — F411 Generalized anxiety disorder: Secondary | ICD-10-CM | POA: Insufficient documentation

## 2013-07-07 DIAGNOSIS — IMO0002 Reserved for concepts with insufficient information to code with codable children: Secondary | ICD-10-CM

## 2013-07-07 DIAGNOSIS — Z87891 Personal history of nicotine dependence: Secondary | ICD-10-CM | POA: Insufficient documentation

## 2013-07-07 DIAGNOSIS — R109 Unspecified abdominal pain: Secondary | ICD-10-CM | POA: Insufficient documentation

## 2013-07-07 LAB — BASIC METABOLIC PANEL
BUN: 28 mg/dL — AB (ref 6–23)
CHLORIDE: 91 meq/L — AB (ref 96–112)
CO2: 26 meq/L (ref 19–32)
CREATININE: 1.28 mg/dL (ref 0.50–1.35)
Calcium: 9.8 mg/dL (ref 8.4–10.5)
GFR calc non Af Amer: 53 mL/min — ABNORMAL LOW (ref >90)
GFR, EST AFRICAN AMERICAN: 62 mL/min — AB (ref >90)
Glucose, Bld: 111 mg/dL — ABNORMAL HIGH (ref 70–99)
POTASSIUM: 5 meq/L (ref 3.7–5.3)
Sodium: 131 mEq/L — ABNORMAL LOW (ref 137–147)

## 2013-07-07 LAB — CBC WITH DIFFERENTIAL/PLATELET
BASOS ABS: 0 10*3/uL (ref 0.0–0.1)
Basophils Relative: 0 % (ref 0–1)
EOS PCT: 3 % (ref 0–5)
Eosinophils Absolute: 0.3 10*3/uL (ref 0.0–0.7)
HCT: 40.1 % (ref 39.0–52.0)
HEMOGLOBIN: 13.6 g/dL (ref 13.0–17.0)
LYMPHS PCT: 25 % (ref 12–46)
Lymphs Abs: 2.3 10*3/uL (ref 0.7–4.0)
MCH: 28 pg (ref 26.0–34.0)
MCHC: 33.9 g/dL (ref 30.0–36.0)
MCV: 82.7 fL (ref 78.0–100.0)
MONO ABS: 0.5 10*3/uL (ref 0.1–1.0)
Monocytes Relative: 6 % (ref 3–12)
Neutro Abs: 6 10*3/uL (ref 1.7–7.7)
Neutrophils Relative %: 66 % (ref 43–77)
Platelets: 231 10*3/uL (ref 150–400)
RBC: 4.85 MIL/uL (ref 4.22–5.81)
RDW: 14.6 % (ref 11.5–15.5)
WBC: 9.1 10*3/uL (ref 4.0–10.5)

## 2013-07-07 LAB — I-STAT TROPONIN, ED: Troponin i, poc: 0.02 ng/mL (ref 0.00–0.08)

## 2013-07-07 NOTE — ED Notes (Signed)
Dr. James at bedside  

## 2013-07-07 NOTE — ED Notes (Signed)
Dr. Jeneen Rinks aware of discharge vital signs. Verbal order to continue with discharge.

## 2013-07-07 NOTE — ED Notes (Signed)
Patient presents to ED via [facility]. Patient states that he was working in the yard today and came inside to eat and had a sudden onset of "chest pressure." Pt states that episode lasted approx 10 minutes with associated nausea. Denies any shortness of breath, dizziness, or radiation. Denies any pain this time. Currently A.Fib with hx of the same. Hypertensive with EMS. VSS. A&Ox4. No acute distress noted.

## 2013-07-07 NOTE — ED Provider Notes (Signed)
CSN: [id number]     Arrival date & time 07/07/13  1916 History   First MD Initiated Contact with Patient 07/07/13 2043     Chief Complaint  Patient presents with  . Chest Pain     HPI  Patient presents after an episode of abdominal pain and chest pain. He states he ate a late lunch. He states he ate quite a bit he had a sandwich and some chips and drink. 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 He states he started to feel some nausea and upset stomach. He started walking inside. States he "almost vomited". He states he feels a pressure in his chest. He went inside. He takes that this all resolved within about 10 minutes.  Right ear symptom free. At the time of my evaluation the patient has been here almost 2 hours. In situ like to go home. His history of MI requiring PCI in 1998. He swallows with cardiology or year. His wife reiterates this. He has normal exam of his cardiologist last year.He states " I think I had one"when asked about a recent nuclear medicine study.  Has a history of paroxysmal A. fib. His son arrives in reiterates his cardiologist as told him the only requires aspirin. He is in atrial fibrillation. He is not aware of palpitations. He is not feeling short of breath. He has had no recent edema.  Past Medical History  Diagnosis Date  . Hypertension   . Hyperlipidemia   . Preoperative cardiovascular examination   . Spinal stenosis, lumbar   . CAS (cerebral atherosclerosis)   . Erectile dysfunction   . Preventative health care   . Benign prostatic hypertrophy with urinary obstruction   . Family history of early CAD     male 1st degree relative <50  . Peptic ulcer disease   . Low back pain   . Superficial spreading melanoma   . Sleep apnea, obstructive     CPAP-does not use  . HA (headache)     sinus headaches  . Arthritis     knees  . Myocardial infarction 1997    Hx MI 1997 and 1998  . Anxiety   . Pneumonia 2005  . Anemia   . Depression     takes Zoloft  .  Neuropathy, autonomic, idiopathic peripheral, other   . Blood transfusion 1 yr. ago    jerking,fever-after blood transfusion  . Blood transfusion     given wrong blood  . Coronary artery disease 1998    cardiac stent/ Clearance Dr Arnoldo Morale and Einar Gip with note on chart  . Dysrhythmia     hx of atrial fib per ov note of 12/12   . Bradycardia     hx of bradycardia in past per ov note of 12/12  . GERD (gastroesophageal reflux disease)    Past Surgical History  Procedure Laterality Date  . Esophagogastroduodenoscopy  2004  . Colonoscopy  2004  . Lumbar laminectomy    . Lumbar fusion    . Knee arthroscopy      left  . Shoulder arthroscopy      right  . Knee arthroscopy      right  . Decompression of the median nerve      left wrist and hand  . Revision of tkr  2011    on left  . Coronary stent placement    . Back surgery  2005-last    lumbar x2  . Excisional total knee arthroplasty  12/25/2010  Procedure: EXCISIONAL TOTAL KNEE ARTHROPLASTY;  Surgeon: Mauri Pole;  Location: [facility];  Service: Orthopedics;  Laterality: Right;  repeat irrigation   and debridement, removal and reinsertation of spacer block  . Joint replacement  2011    left knee x 2 ; 08/2010-right knee-infected  . I&d extremity  03/25/2011    Procedure: IRRIGATION AND DEBRIDEMENT EXTREMITY;  Surgeon: Mauri Pole, MD;  Location: [facility];  Service: Orthopedics;  Laterality: Right;  . Coronary angioplasty  [place]   . Total knee revision  05/13/2011    Procedure: TOTAL KNEE REVISION;  Surgeon: Mauri Pole, MD;  Location: [facility];  Service: Orthopedics;  Laterality: Right;  Reimplantation   Family History  Problem Relation Age of Onset  . Heart disease Father   . Heart disease Mother   . Diabetes Sister   . Heart disease Brother   . Breast cancer Brother   . Breast cancer Sister    History  Substance Use Topics  . Smoking status: Former Smoker -- 1.00 packs/day for 15 years    Types: Cigars     Quit date: 12/23/2009  . Smokeless tobacco: Never Used     Comment: smoked a pipe for 6-7 y ears  . Alcohol Use: No    Review of Systems  Constitutional: Negative for fever, chills, diaphoresis, appetite change and fatigue.  HENT: Negative for mouth sores, sore throat and trouble swallowing.   Eyes: Negative for visual disturbance.  Respiratory: Negative for cough, chest tightness, shortness of breath and wheezing.   Cardiovascular: Positive for chest pain.  Gastrointestinal: Positive for nausea and abdominal pain. Negative for vomiting, diarrhea and abdominal distention.  Endocrine: Negative for polydipsia, polyphagia and polyuria.  Genitourinary: Negative for dysuria, frequency and hematuria.  Musculoskeletal: Negative for gait problem.  Skin: Negative for color change, pallor and rash.  Neurological: Negative for dizziness, syncope, light-headedness and headaches.  Hematological: Does not bruise/bleed easily.  Psychiatric/Behavioral: Negative for behavioral problems and confusion.      Allergies  Acetaminophen  Home Medications   Prior to Admission medications   Medication Sig Start Date End Date Taking? Authorizing Provider  aspirin 81 MG tablet Take 81 mg by mouth daily.   Yes Historical Provider, MD  Azelastine-Fluticasone (DYMISTA) 137-50 MCG/ACT SUSP Place 2 sprays into the nose 2 (two) times daily.   Yes Historical Provider, MD  carvedilol (COREG) 6.25 MG tablet Take 6.25 mg by mouth 2 (two) times daily with a meal.   Yes Historical Provider, MD  metoprolol succinate (TOPROL-XL) 100 MG 24 hr tablet Take 100 mg by mouth every morning.  03/11/13  Yes Historical Provider, MD  morphine (MS CONTIN) 30 MG 12 hr tablet Take 30 mg by mouth every 12 (twelve) hours.   Yes Historical Provider, MD  OLANZapine (ZYPREXA) 5 MG tablet Take 5 mg by mouth at bedtime.   Yes Historical Provider, MD  omeprazole-sodium bicarbonate (ZEGERID) 40-1100 MG per capsule Take 1 capsule by mouth  daily before breakfast. 04/19/13  Yes Jerene Bears, MD  PROAIR HFA 108 (90 BASE) MCG/ACT inhaler Inhale 2 puffs into the lungs every 6 (six) hours as needed for wheezing.  05/24/13  Yes Historical Provider, MD  spironolactone (ALDACTONE) 25 MG tablet Take 25 mg by mouth daily.   Yes Historical Provider, MD  fentaNYL (DURAGESIC - DOSED MCG/HR) 25 MCG/HR patch Place 25 mcg onto the skin every 3 (three) days.    Historical Provider, MD  Delilah Shan  0.4 MG SL tablet Place 0.4 mg under the tongue every 5 (five) minutes as needed for chest pain.  05/22/12   Historical Provider, MD   BP 176/104  Pulse 59  Resp 14  SpO2 99% Physical Exam  Constitutional: He is oriented to person, place, and time. He appears well-developed and well-nourished. No distress.  HENT:  Head: Normocephalic.  Eyes: Conjunctivae are normal. Pupils are equal, round, and reactive to light. No scleral icterus.  Neck: Normal range of motion. Neck supple. No thyromegaly present.  Cardiovascular: Normal rate and regular rhythm.  Exam reveals no gallop and no friction rub.   No murmur heard. Pulmonary/Chest: Effort normal and breath sounds normal. No respiratory distress. He has no wheezes. He has no rales.  Abdominal: Soft. Bowel sounds are normal. He exhibits no distension. There is no tenderness. There is no rebound.  Musculoskeletal: Normal range of motion.  Neurological: He is alert and oriented to person, place, and time.  Skin: Skin is warm and dry. No rash noted.  Psychiatric: He has a normal mood and affect. His behavior is normal.    ED Course  Procedures (including critical care time) Labs Review Labs Reviewed  BASIC METABOLIC PANEL - Abnormal; Notable for the following:    Sodium 131 (*)    Chloride 91 (*)    Glucose, Bld 111 (*)    BUN 28 (*)    GFR calc non Af Amer 53 (*)    GFR calc Af Amer 62 (*)    All other components within normal limits  CBC WITH DIFFERENTIAL  I-STAT TROPOININ, ED    Imaging Review No  results found.   EKG Interpretation   Date/Time:  Wednesday Jul 07 2013 19:17:26 EDT Ventricular Rate:  59 PR Interval:    QRS Duration: 104 QT Interval:  417 QTC Calculation: 413 R Axis:   -37 Text Interpretation:  Atrial fibrillation LVH with secondary  repolarization abnormality Baseline wander in lead(s) II III aVR aVF  Confirmed by Jeneen Rinks  MD, [place] ([id number]) on 07/07/2013 9:21:11 PM      MDM   Final diagnoses:  Chest pain    He is now a 5 hour status post this brief episode. His EKG shows atrial fibrillation but no ischemia. His troponin is normal. He is asymptomatic. He is underway control. He shows no signs of congestive heart failure. I think is appropriate for outpatient treatment. Return to the ER with any recurrence of his pain. Continue his current medicine regimen regimen as per his most recent cardiology recommendations.    Tanna Furry, MD 07/07/13 [phone]

## 2013-07-07 NOTE — Discharge Instructions (Signed)
Return to ER with any recurrence of Chest Pain. Do not skip your Blood Pressure Medicine, or Aspirin.  Chest Pain (Nonspecific) Chest pain has many causes. Your pain could be caused by something serious, such as a heart attack or a blood clot in the lungs. It could also be caused by something less serious, such as a chest bruise or a virus. Follow up with your doctor. More lab tests or other studies may be needed to find the cause of your pain. Most of the time, nonspecific chest pain will improve within 2 to 3 days of rest and mild pain medicine. HOME CARE  For chest bruises, you may put ice on the sore area for 15-20 minutes, 03-04 times a day. Do this only if it makes you feel better.  Put ice in a plastic bag.  Place a towel between the skin and the bag.  Rest for the next 2 to 3 days.  Go back to work if the pain improves.  See your doctor if the pain lasts longer than 1 to 2 weeks.  Only take medicine as told by your doctor.  Quit smoking if you smoke. GET HELP RIGHT AWAY IF:   There is more pain or pain that spreads to the arm, neck, jaw, back, or belly (abdomen).  You have shortness of breath.  You cough more than usual or cough up blood.  You have very bad back or belly pain, feel sick to your stomach (nauseous), or throw up (vomit).  You have very bad weakness.  You pass out (faint).  You have a fever. Any of these problems may be serious and may be an emergency. Do not wait to see if the problems will go away. Get medical help right away. Call your local emergency services 911 in [place] Do not drive yourself to the hospital. MAKE SURE YOU:   Understand these instructions.  Will watch this condition.  Will get help right away if you or your child is not doing well or gets worse. Document Released: 07/24/2007 Document Revised: 04/29/2011 Document Reviewed: 07/24/2007 [facility] Patient Information 2014 [place], [place].

## 2013-07-08 ENCOUNTER — Encounter: Payer: Self-pay | Admitting: Internal Medicine

## 2013-07-08 ENCOUNTER — Ambulatory Visit: Payer: Medicare Other

## 2013-07-12 ENCOUNTER — Emergency Department (HOSPITAL_COMMUNITY): Payer: Medicare Other

## 2013-07-12 ENCOUNTER — Encounter (HOSPITAL_COMMUNITY): Payer: Self-pay | Admitting: Emergency Medicine

## 2013-07-12 ENCOUNTER — Inpatient Hospital Stay (HOSPITAL_COMMUNITY)
Admission: EM | Admit: 2013-07-12 | Discharge: 2013-07-16 | DRG: 070 | Disposition: A | Discharge status: Home / Self Care | Payer: Medicare Other | Attending: Internal Medicine | Admitting: Internal Medicine

## 2013-07-12 DIAGNOSIS — M48061 Spinal stenosis, lumbar region without neurogenic claudication: Secondary | ICD-10-CM

## 2013-07-12 DIAGNOSIS — F1123 Opioid dependence with withdrawal: Secondary | ICD-10-CM

## 2013-07-12 DIAGNOSIS — N179 Acute kidney failure, unspecified: Secondary | ICD-10-CM | POA: Diagnosis present

## 2013-07-12 DIAGNOSIS — C439 Malignant melanoma of skin, unspecified: Secondary | ICD-10-CM

## 2013-07-12 DIAGNOSIS — Y9289 Other specified places as the place of occurrence of the external cause: Secondary | ICD-10-CM

## 2013-07-12 DIAGNOSIS — F1920 Other psychoactive substance dependence, uncomplicated: Secondary | ICD-10-CM | POA: Diagnosis present

## 2013-07-12 DIAGNOSIS — M545 Low back pain, unspecified: Secondary | ICD-10-CM | POA: Diagnosis present

## 2013-07-12 DIAGNOSIS — Z96659 Presence of unspecified artificial knee joint: Secondary | ICD-10-CM

## 2013-07-12 DIAGNOSIS — F528 Other sexual dysfunction not due to a substance or known physiological condition: Secondary | ICD-10-CM

## 2013-07-12 DIAGNOSIS — F1193 Opioid use, unspecified with withdrawal: Secondary | ICD-10-CM

## 2013-07-12 DIAGNOSIS — M21372 Foot drop, left foot: Secondary | ICD-10-CM

## 2013-07-12 DIAGNOSIS — K219 Gastro-esophageal reflux disease without esophagitis: Secondary | ICD-10-CM | POA: Diagnosis present

## 2013-07-12 DIAGNOSIS — M21371 Foot drop, right foot: Secondary | ICD-10-CM

## 2013-07-12 DIAGNOSIS — N401 Enlarged prostate with lower urinary tract symptoms: Secondary | ICD-10-CM | POA: Diagnosis present

## 2013-07-12 DIAGNOSIS — M533 Sacrococcygeal disorders, not elsewhere classified: Secondary | ICD-10-CM

## 2013-07-12 DIAGNOSIS — Z8249 Family history of ischemic heart disease and other diseases of the circulatory system: Secondary | ICD-10-CM

## 2013-07-12 DIAGNOSIS — F19939 Other psychoactive substance use, unspecified with withdrawal, unspecified: Secondary | ICD-10-CM | POA: Diagnosis present

## 2013-07-12 DIAGNOSIS — F329 Major depressive disorder, single episode, unspecified: Secondary | ICD-10-CM | POA: Diagnosis present

## 2013-07-12 DIAGNOSIS — I252 Old myocardial infarction: Secondary | ICD-10-CM

## 2013-07-12 DIAGNOSIS — J690 Pneumonitis due to inhalation of food and vomit: Secondary | ICD-10-CM | POA: Diagnosis present

## 2013-07-12 DIAGNOSIS — F3289 Other specified depressive episodes: Secondary | ICD-10-CM | POA: Diagnosis present

## 2013-07-12 DIAGNOSIS — K279 Peptic ulcer, site unspecified, unspecified as acute or chronic, without hemorrhage or perforation: Secondary | ICD-10-CM

## 2013-07-12 DIAGNOSIS — I129 Hypertensive chronic kidney disease with stage 1 through stage 4 chronic kidney disease, or unspecified chronic kidney disease: Secondary | ICD-10-CM | POA: Diagnosis present

## 2013-07-12 DIAGNOSIS — N183 Chronic kidney disease, stage 3 unspecified: Secondary | ICD-10-CM | POA: Diagnosis present

## 2013-07-12 DIAGNOSIS — T43505A Adverse effect of unspecified antipsychotics and neuroleptics, initial encounter: Secondary | ICD-10-CM | POA: Diagnosis present

## 2013-07-12 DIAGNOSIS — G9340 Encephalopathy, unspecified: Secondary | ICD-10-CM

## 2013-07-12 DIAGNOSIS — G8929 Other chronic pain: Secondary | ICD-10-CM | POA: Diagnosis present

## 2013-07-12 DIAGNOSIS — I672 Cerebral atherosclerosis: Secondary | ICD-10-CM

## 2013-07-12 DIAGNOSIS — E785 Hyperlipidemia, unspecified: Secondary | ICD-10-CM | POA: Diagnosis present

## 2013-07-12 DIAGNOSIS — G4733 Obstructive sleep apnea (adult) (pediatric): Secondary | ICD-10-CM | POA: Diagnosis present

## 2013-07-12 DIAGNOSIS — R109 Unspecified abdominal pain: Secondary | ICD-10-CM

## 2013-07-12 DIAGNOSIS — N138 Other obstructive and reflux uropathy: Secondary | ICD-10-CM | POA: Diagnosis present

## 2013-07-12 DIAGNOSIS — M1990 Unspecified osteoarthritis, unspecified site: Secondary | ICD-10-CM | POA: Diagnosis present

## 2013-07-12 DIAGNOSIS — J309 Allergic rhinitis, unspecified: Secondary | ICD-10-CM

## 2013-07-12 DIAGNOSIS — Z9861 Coronary angioplasty status: Secondary | ICD-10-CM

## 2013-07-12 DIAGNOSIS — I4891 Unspecified atrial fibrillation: Secondary | ICD-10-CM | POA: Diagnosis present

## 2013-07-12 DIAGNOSIS — IMO0002 Reserved for concepts with insufficient information to code with codable children: Secondary | ICD-10-CM

## 2013-07-12 DIAGNOSIS — I10 Essential (primary) hypertension: Secondary | ICD-10-CM | POA: Diagnosis present

## 2013-07-12 DIAGNOSIS — R4182 Altered mental status, unspecified: Secondary | ICD-10-CM

## 2013-07-12 DIAGNOSIS — E871 Hypo-osmolality and hyponatremia: Secondary | ICD-10-CM

## 2013-07-12 DIAGNOSIS — L570 Actinic keratosis: Secondary | ICD-10-CM

## 2013-07-12 DIAGNOSIS — G9009 Other idiopathic peripheral autonomic neuropathy: Secondary | ICD-10-CM

## 2013-07-12 DIAGNOSIS — Z87891 Personal history of nicotine dependence: Secondary | ICD-10-CM

## 2013-07-12 DIAGNOSIS — F411 Generalized anxiety disorder: Secondary | ICD-10-CM | POA: Diagnosis present

## 2013-07-12 DIAGNOSIS — G9341 Metabolic encephalopathy: Principal | ICD-10-CM | POA: Diagnosis present

## 2013-07-12 DIAGNOSIS — M79609 Pain in unspecified limb: Secondary | ICD-10-CM

## 2013-07-12 DIAGNOSIS — E236 Other disorders of pituitary gland: Secondary | ICD-10-CM | POA: Diagnosis present

## 2013-07-12 DIAGNOSIS — R351 Nocturia: Secondary | ICD-10-CM

## 2013-07-12 DIAGNOSIS — I2510 Atherosclerotic heart disease of native coronary artery without angina pectoris: Secondary | ICD-10-CM | POA: Diagnosis present

## 2013-07-12 DIAGNOSIS — Z8711 Personal history of peptic ulcer disease: Secondary | ICD-10-CM

## 2013-07-12 DIAGNOSIS — Z79899 Other long term (current) drug therapy: Secondary | ICD-10-CM

## 2013-07-12 DIAGNOSIS — Z7982 Long term (current) use of aspirin: Secondary | ICD-10-CM

## 2013-07-12 DIAGNOSIS — J189 Pneumonia, unspecified organism: Secondary | ICD-10-CM

## 2013-07-12 LAB — COMPREHENSIVE METABOLIC PANEL
ALT: 16 U/L (ref 0–53)
AST: 27 U/L (ref 0–37)
Albumin: 4.2 g/dL (ref 3.5–5.2)
Alkaline Phosphatase: 102 U/L (ref 39–117)
BILIRUBIN TOTAL: 0.4 mg/dL (ref 0.3–1.2)
BUN: 28 mg/dL — AB (ref 6–23)
CALCIUM: 9.2 mg/dL (ref 8.4–10.5)
CO2: 21 meq/L (ref 19–32)
Chloride: 82 mEq/L — ABNORMAL LOW (ref 96–112)
Creatinine, Ser: 1.32 mg/dL (ref 0.50–1.35)
GFR calc non Af Amer: 51 mL/min — ABNORMAL LOW (ref >90)
GFR, EST AFRICAN AMERICAN: 60 mL/min — AB (ref >90)
GLUCOSE: 96 mg/dL (ref 70–99)
Potassium: 4.1 mEq/L (ref 3.7–5.3)
Sodium: 122 mEq/L — ABNORMAL LOW (ref 137–147)
Total Protein: 7.7 g/dL (ref 6.0–8.3)

## 2013-07-12 LAB — BASIC METABOLIC PANEL
BUN: 26 mg/dL — AB (ref 6–23)
BUN: 26 mg/dL — ABNORMAL HIGH (ref 6–23)
CALCIUM: 8.9 mg/dL (ref 8.4–10.5)
CHLORIDE: 85 meq/L — AB (ref 96–112)
CO2: 23 meq/L (ref 19–32)
CO2: 22 mEq/L (ref 19–32)
CREATININE: 1.31 mg/dL (ref 0.50–1.35)
Calcium: 9 mg/dL (ref 8.4–10.5)
Chloride: 85 mEq/L — ABNORMAL LOW (ref 96–112)
Creatinine, Ser: 1.39 mg/dL — ABNORMAL HIGH (ref 0.50–1.35)
GFR calc Af Amer: 56 mL/min — ABNORMAL LOW (ref >90)
GFR calc non Af Amer: 48 mL/min — ABNORMAL LOW (ref >90)
GFR calc non Af Amer: 52 mL/min — ABNORMAL LOW (ref >90)
GFR, EST AFRICAN AMERICAN: 60 mL/min — AB (ref >90)
Glucose, Bld: 81 mg/dL (ref 70–99)
Glucose, Bld: 97 mg/dL (ref 70–99)
POTASSIUM: 3.8 meq/L (ref 3.7–5.3)
Potassium: 4 mEq/L (ref 3.7–5.3)
Sodium: 124 mEq/L — ABNORMAL LOW (ref 137–147)
Sodium: 122 mEq/L — ABNORMAL LOW (ref 137–147)

## 2013-07-12 LAB — URINE CULTURE
Colony Count: NO GROWTH
Culture: NO GROWTH

## 2013-07-12 LAB — BLOOD GAS, ARTERIAL
Acid-base deficit: 1.4 mmol/L (ref 0.0–2.0)
BICARBONATE: 21.4 meq/L (ref 20.0–24.0)
DRAWN BY: 40530
FIO2: 0.21 %
O2 Saturation: 97.8 %
PATIENT TEMPERATURE: 98.6
PH ART: 7.503 — AB (ref 7.350–7.450)
TCO2: 22.3 mmol/L (ref 0–100)
pCO2 arterial: 27.5 mmHg — ABNORMAL LOW (ref 35.0–45.0)
pO2, Arterial: 93.4 mmHg (ref 80.0–100.0)

## 2013-07-12 LAB — CBC WITH DIFFERENTIAL/PLATELET
BASOS ABS: 0 10*3/uL (ref 0.0–0.1)
Basophils Relative: 0 % (ref 0–1)
EOS PCT: 1 % (ref 0–5)
Eosinophils Absolute: 0.1 10*3/uL (ref 0.0–0.7)
HEMATOCRIT: 37.4 % — AB (ref 39.0–52.0)
Hemoglobin: 13.4 g/dL (ref 13.0–17.0)
LYMPHS ABS: 2.2 10*3/uL (ref 0.7–4.0)
LYMPHS PCT: 13 % (ref 12–46)
MCH: 28.5 pg (ref 26.0–34.0)
MCHC: 35.8 g/dL (ref 30.0–36.0)
MCV: 79.6 fL (ref 78.0–100.0)
MONO ABS: 0.8 10*3/uL (ref 0.1–1.0)
MONOS PCT: 5 % (ref 3–12)
Neutro Abs: 13.9 10*3/uL — ABNORMAL HIGH (ref 1.7–7.7)
Neutrophils Relative %: 81 % — ABNORMAL HIGH (ref 43–77)
Platelets: 264 10*3/uL (ref 150–400)
RBC: 4.7 MIL/uL (ref 4.22–5.81)
RDW: 14.3 % (ref 11.5–15.5)
WBC: 17.1 10*3/uL — ABNORMAL HIGH (ref 4.0–10.5)

## 2013-07-12 LAB — URINALYSIS, ROUTINE W REFLEX MICROSCOPIC
Bilirubin Urine: NEGATIVE
Bilirubin Urine: NEGATIVE
GLUCOSE, UA: NEGATIVE mg/dL
Glucose, UA: NEGATIVE mg/dL
Hgb urine dipstick: NEGATIVE
Ketones, ur: NEGATIVE mg/dL
Ketones, ur: 15 mg/dL — AB
LEUKOCYTES UA: NEGATIVE
Leukocytes, UA: NEGATIVE
NITRITE: NEGATIVE
Nitrite: NEGATIVE
PROTEIN: 30 mg/dL — AB
PROTEIN: NEGATIVE mg/dL
Specific Gravity, Urine: 1.014 (ref 1.005–1.030)
Specific Gravity, Urine: 1.015 (ref 1.005–1.030)
UROBILINOGEN UA: 0.2 mg/dL (ref 0.0–1.0)
Urobilinogen, UA: 0.2 mg/dL (ref 0.0–1.0)
pH: 8 (ref 5.0–8.0)
pH: 8 (ref 5.0–8.0)

## 2013-07-12 LAB — TROPONIN I
Troponin I: <0.3 ng/mL (ref <0.30)
Troponin I: <0.3 ng/mL (ref <0.30)

## 2013-07-12 LAB — ACETAMINOPHEN LEVEL: Acetaminophen (Tylenol), Serum: <15 ug/mL (ref 10–30)

## 2013-07-12 LAB — PRO B NATRIURETIC PEPTIDE: Pro B Natriuretic peptide (BNP): 13744 pg/mL — ABNORMAL HIGH (ref 0–125)

## 2013-07-12 LAB — HEPATIC FUNCTION PANEL
ALBUMIN: 3.6 g/dL (ref 3.5–5.2)
ALK PHOS: 86 U/L (ref 39–117)
ALT: 15 U/L (ref 0–53)
AST: 31 U/L (ref 0–37)
BILIRUBIN TOTAL: 0.5 mg/dL (ref 0.3–1.2)
Total Protein: 6.7 g/dL (ref 6.0–8.3)

## 2013-07-12 LAB — ETHANOL: Alcohol, Ethyl (B): <11 mg/dL (ref 0–11)

## 2013-07-12 LAB — RAPID URINE DRUG SCREEN, HOSP PERFORMED
Amphetamines: NOT DETECTED
BARBITURATES: NOT DETECTED
Benzodiazepines: NOT DETECTED
Cocaine: NOT DETECTED
Opiates: POSITIVE — AB
TETRAHYDROCANNABINOL: NOT DETECTED

## 2013-07-12 LAB — TSH: TSH: 1.29 u[IU]/mL (ref 0.350–4.500)

## 2013-07-12 LAB — GLUCOSE, CAPILLARY
GLUCOSE-CAPILLARY: 80 mg/dL (ref 70–99)
GLUCOSE-CAPILLARY: 106 mg/dL — AB (ref 70–99)
Glucose-Capillary: 101 mg/dL — ABNORMAL HIGH (ref 70–99)
Glucose-Capillary: 148 mg/dL — ABNORMAL HIGH (ref 70–99)

## 2013-07-12 LAB — CORTISOL: Cortisol, Plasma: 21.3 ug/dL

## 2013-07-12 LAB — AMMONIA: Ammonia: 20 umol/L (ref 11–60)

## 2013-07-12 LAB — DIGOXIN LEVEL: Digoxin Level: <0.3 ng/mL — ABNORMAL LOW (ref 0.8–2.0)

## 2013-07-12 LAB — CREATININE, URINE, RANDOM: CREATININE, URINE: 33.25 mg/dL

## 2013-07-12 LAB — SODIUM, URINE, RANDOM: Sodium, Ur: 131 mEq/L

## 2013-07-12 LAB — CBG MONITORING, ED: Glucose-Capillary: 101 mg/dL — ABNORMAL HIGH (ref 70–99)

## 2013-07-12 LAB — SALICYLATE LEVEL: SALICYLATE LVL: 2.5 mg/dL — AB (ref 2.8–20.0)

## 2013-07-12 LAB — URINE MICROSCOPIC-ADD ON

## 2013-07-12 LAB — URIC ACID: Uric Acid, Serum: 5.6 mg/dL (ref 4.0–7.8)

## 2013-07-12 LAB — OSMOLALITY, URINE: Osmolality, Ur: 395 mOsm/kg (ref 390–1090)

## 2013-07-12 LAB — OSMOLALITY: OSMOLALITY: 260 mosm/kg — AB (ref 275–300)

## 2013-07-12 LAB — STREP PNEUMONIAE URINARY ANTIGEN: Strep Pneumo Urinary Antigen: NEGATIVE

## 2013-07-12 MED ORDER — FENTANYL CITRATE 0.05 MG/ML IJ SOLN
50.0000 ug | INTRAMUSCULAR | Status: DC | PRN
  Administered 2013-07-12: 50 ug via INTRAVENOUS
  Filled 2013-07-12: qty 2

## 2013-07-12 MED ORDER — SODIUM CHLORIDE 0.9 % IV BOLUS (SEPSIS)
1000.0000 mL | Freq: Once | INTRAVENOUS | Status: AC
  Administered 2013-07-12: 1000 mL via INTRAVENOUS

## 2013-07-12 MED ORDER — MORPHINE SULFATE ER 15 MG PO TBCR
15.0000 mg | EXTENDED_RELEASE_TABLET | Freq: Two times a day (BID) | ORAL | Status: DC
  Administered 2013-07-12 – 2013-07-16 (×9): 15 mg via ORAL
  Filled 2013-07-12 (×9): qty 1

## 2013-07-12 MED ORDER — NITROGLYCERIN 0.4 MG SL SUBL: 0.4000 mg | SUBLINGUAL_TABLET | SUBLINGUAL | Status: DC | PRN

## 2013-07-12 MED ORDER — HEPARIN SODIUM (PORCINE) 5000 UNIT/ML IJ SOLN
5000.0000 [IU] | Freq: Three times a day (TID) | INTRAMUSCULAR | Status: DC
  Administered 2013-07-12 – 2013-07-16 (×12): 5000 [IU] via SUBCUTANEOUS
  Filled 2013-07-12 (×15): qty 1

## 2013-07-12 MED ORDER — CARVEDILOL 3.125 MG PO TABS
3.1250 mg | ORAL_TABLET | Freq: Two times a day (BID) | ORAL | Status: DC
  Filled 2013-07-12: qty 1

## 2013-07-12 MED ORDER — MORPHINE SULFATE 4 MG/ML IJ SOLN
4.0000 mg | Freq: Once | INTRAMUSCULAR | Status: AC
  Administered 2013-07-12: 4 mg via INTRAVENOUS
  Filled 2013-07-12: qty 1

## 2013-07-12 MED ORDER — SODIUM CHLORIDE 0.9 % IV SOLN
INTRAVENOUS | Status: DC
  Administered 2013-07-12: 14:00:00 via INTRAVENOUS

## 2013-07-12 MED ORDER — HYDROCODONE-ACETAMINOPHEN 5-325 MG PO TABS
1.0000 | ORAL_TABLET | Freq: Four times a day (QID) | ORAL | Status: DC | PRN
  Administered 2013-07-12 – 2013-07-14 (×8): 1 via ORAL
  Filled 2013-07-12 (×2): qty 1
  Filled 2013-07-12: qty 2
  Filled 2013-07-12 (×5): qty 1

## 2013-07-12 MED ORDER — SODIUM CHLORIDE 0.9 % IJ SOLN
3.0000 mL | Freq: Two times a day (BID) | INTRAMUSCULAR | Status: DC
  Administered 2013-07-13 – 2013-07-16 (×5): 3 mL via INTRAVENOUS

## 2013-07-12 MED ORDER — PANTOPRAZOLE SODIUM 40 MG PO TBEC
40.0000 mg | DELAYED_RELEASE_TABLET | Freq: Every day | ORAL | Status: DC
  Administered 2013-07-12 – 2013-07-16 (×5): 40 mg via ORAL
  Filled 2013-07-12 (×4): qty 1

## 2013-07-12 MED ORDER — METOPROLOL TARTRATE 1 MG/ML IV SOLN
5.0000 mg | Freq: Four times a day (QID) | INTRAVENOUS | Status: DC
  Administered 2013-07-12: 5 mg via INTRAVENOUS
  Filled 2013-07-12: qty 5

## 2013-07-12 MED ORDER — FUROSEMIDE 10 MG/ML IJ SOLN
20.0000 mg | Freq: Once | INTRAMUSCULAR | Status: AC
  Administered 2013-07-12: 20 mg via INTRAVENOUS
  Filled 2013-07-12: qty 2

## 2013-07-12 MED ORDER — FENTANYL CITRATE 0.05 MG/ML IJ SOLN: 25.0000 ug | INTRAMUSCULAR | Status: DC | PRN

## 2013-07-12 MED ORDER — LORAZEPAM 2 MG/ML IJ SOLN
1.0000 mg | Freq: Once | INTRAMUSCULAR | Status: AC
  Administered 2013-07-12: 1 mg via INTRAVENOUS
  Filled 2013-07-12: qty 1

## 2013-07-12 MED ORDER — ASPIRIN 81 MG PO CHEW
81.0000 mg | CHEWABLE_TABLET | Freq: Every day | ORAL | Status: DC
  Administered 2013-07-12 – 2013-07-16 (×5): 81 mg via ORAL
  Filled 2013-07-12 (×6): qty 1

## 2013-07-12 MED ORDER — METRONIDAZOLE IN NACL 5-0.79 MG/ML-% IV SOLN
500.0000 mg | Freq: Three times a day (TID) | INTRAVENOUS | Status: DC
  Administered 2013-07-12 – 2013-07-16 (×13): 500 mg via INTRAVENOUS
  Filled 2013-07-12 (×16): qty 100

## 2013-07-12 MED ORDER — LEVOFLOXACIN IN D5W 750 MG/150ML IV SOLN
750.0000 mg | INTRAVENOUS | Status: DC
  Administered 2013-07-12 – 2013-07-13 (×2): 750 mg via INTRAVENOUS
  Filled 2013-07-12 (×2): qty 150

## 2013-07-12 MED ORDER — MORPHINE SULFATE 4 MG/ML IJ SOLN: 4.0000 mg | INTRAMUSCULAR | Status: DC | PRN

## 2013-07-12 MED ORDER — FENTANYL CITRATE 0.05 MG/ML IJ SOLN
25.0000 ug | INTRAMUSCULAR | Status: DC | PRN
  Administered 2013-07-12 – 2013-07-16 (×18): 25 ug via INTRAVENOUS
  Filled 2013-07-12 (×19): qty 2

## 2013-07-12 NOTE — Progress Notes (Signed)
Received pt from the ED, a&o, v/s wnl. Pt looks very agitated with dx of AMS. Pt does answer most questions appropriately with periods of confusion. Call bell within reach. Pt and family oriented to unit. Tele in place with NSR.

## 2013-07-12 NOTE — H&P (Addendum)
[facility] History and Physical  [name] IPJ:[id number] DOB: 1940-10-10 DOA: 07/12/2013  Referring physician: ER physician. PCP: Tamsen Roers, MD   Chief Complaint: Altered mental status.  History obtained from patient's wife.  HPI: [name] is a 73 y.o. male with history of chronic back pain on long-term morphine 30 mg twice daily was recently changed to fentanyl patch 2 days ago following which patient's mental status changed. Patient became more restless confused. As per the family patient did not have any fever chills loss of consciousness or any seizure-like activities. Patient did not have any chest pain productive cough shortness of breath or any focal deficits. In the ER patient was found to be confused. CTA did not show anything acute. Labs show leukocytosis hyponatremia and chest x-ray shows possible infiltrates. Patient on exam is oriented to his name and place but is very restless. There is no neck rigidity on exam and patient denies any headache.   Review of Systems: As presented in the history of presenting illness, rest negative.  Past Medical History  Diagnosis Date  . Hypertension   . Hyperlipidemia   . Preoperative cardiovascular examination   . Spinal stenosis, lumbar   . CAS (cerebral atherosclerosis)   . Erectile dysfunction   . Preventative health care   . Benign prostatic hypertrophy with urinary obstruction   . Family history of early CAD     male 1st degree relative <50  . Peptic ulcer disease   . Low back pain   . Superficial spreading melanoma   . Sleep apnea, obstructive     CPAP-does not use  . HA (headache)     sinus headaches  . Arthritis     knees  . Myocardial infarction 1997    Hx MI 1997 and 1998  . Anxiety   . Pneumonia 2005  . Anemia   . Depression     takes Zoloft  . Neuropathy, autonomic, idiopathic peripheral, other   . Blood transfusion 1 yr. ago    jerking,fever-after blood transfusion  . Blood transfusion      given wrong blood  . Coronary artery disease 1998    cardiac stent/ Clearance Dr Arnoldo Morale and Einar Gip with note on chart  . Dysrhythmia     hx of atrial fib per ov note of 12/12   . Bradycardia     hx of bradycardia in past per ov note of 12/12  . GERD (gastroesophageal reflux disease)    Past Surgical History  Procedure Laterality Date  . Esophagogastroduodenoscopy  2004  . Colonoscopy  2004  . Lumbar laminectomy    . Lumbar fusion    . Knee arthroscopy      left  . Shoulder arthroscopy      right  . Knee arthroscopy      right  . Decompression of the median nerve      left wrist and hand  . Revision of tkr  2011    on left  . Coronary stent placement    . Back surgery  2005-last    lumbar x2  . Excisional total knee arthroplasty  12/25/2010    Procedure: EXCISIONAL TOTAL KNEE ARTHROPLASTY;  Surgeon: Mauri Pole;  Location: [facility];  Service: Orthopedics;  Laterality: Right;  repeat irrigation   and debridement, removal and reinsertation of spacer block  . Joint replacement  2011    left knee x 2 ; 08/2010-right knee-infected  . I&d extremity  03/25/2011    Procedure:  IRRIGATION AND DEBRIDEMENT EXTREMITY;  Surgeon: Mauri Pole, MD;  Location: [facility];  Service: Orthopedics;  Laterality: Right;  . Coronary angioplasty  [place]   . Total knee revision  05/13/2011    Procedure: TOTAL KNEE REVISION;  Surgeon: Mauri Pole, MD;  Location: [facility];  Service: Orthopedics;  Laterality: Right;  Reimplantation   Social History:  reports that he quit smoking about 3 years ago. His smoking use included Cigars. He has never used smokeless tobacco. He reports that he does not drink alcohol or use illicit drugs. Where does patient live home. Can patient participate in ADLs? Yes.  Allergies  Allergen Reactions  . Acetaminophen Other (See Comments)    LIVER INFLAMMATION IN HIGH DOSES    Family History:  Family History  Problem Relation Age of Onset  . Heart disease  Father   . Heart disease Mother   . Diabetes Sister   . Heart disease Brother   . Breast cancer Brother   . Breast cancer Sister       Prior to Admission medications   Medication Sig Start Date End Date Taking? Authorizing Provider  aspirin 81 MG tablet Take 81 mg by mouth daily.   Yes Historical Provider, MD  Azelastine-Fluticasone (DYMISTA) 137-50 MCG/ACT SUSP Place 2 sprays into the nose 2 (two) times daily.   Yes Historical Provider, MD  carvedilol (COREG) 6.25 MG tablet Take 6.25 mg by mouth 2 (two) times daily with a meal.   Yes Historical Provider, MD  fentaNYL (DURAGESIC - DOSED MCG/HR) 25 MCG/HR patch Place 25 mcg onto the skin every 3 (three) days.   Yes Historical Provider, MD  metoprolol succinate (TOPROL-XL) 100 MG 24 hr tablet Take 100 mg by mouth every morning.  03/11/13  Yes Historical Provider, MD  NITROSTAT 0.4 MG SL tablet Place 0.4 mg under the tongue every 5 (five) minutes as needed for chest pain.  05/22/12  Yes Historical Provider, MD  OLANZapine (ZYPREXA) 5 MG tablet Take 5 mg by mouth at bedtime.   Yes Historical Provider, MD  omeprazole-sodium bicarbonate (ZEGERID) 40-1100 MG per capsule Take 1 capsule by mouth daily before breakfast. 04/19/13  Yes Jerene Bears, MD  PROAIR HFA 108 (90 BASE) MCG/ACT inhaler Inhale 2 puffs into the lungs every 6 (six) hours as needed for wheezing.  05/24/13  Yes Historical Provider, MD  spironolactone (ALDACTONE) 25 MG tablet Take 25 mg by mouth daily.   Yes Historical Provider, MD    Physical Exam: Filed Vitals:   07/12/13 0400 07/12/13 0415 07/12/13 0440 07/12/13 0532  BP: 143/92 164/89 141/83 120/67  Pulse: 80 71    Temp:    98.4 F (36.9 C)  TempSrc:      Resp: 27 15 21 20   Height:    5\' 10"  (1.778 m)  Weight:    86.183 kg (190 lb)  SpO2: 96% 97%       General:  Well-developed and nourished.  Eyes: Anicteric no pallor.  ENT: No discharge from the ears eyes nose mouth.  Neck: No neck rigidity. No mass.  Cardiovascular:  S1-S2 heard.  Respiratory: No rhonchi or crepitations.  Abdomen: Soft nontender bowel sounds present. No guarding or rigidity.  Skin: No rash.  Musculoskeletal: No edema.  Psychiatric: Patient is confused but oriented to his name and place.  Neurologic: Follows commands and moves all extremities but looks restless. PERRLA positive. No facial asymmetry.  Labs on Admission:  Basic Metabolic Panel:  Recent Labs Lab 07/07/13 2138 07/12/13 0127  NA 131* 122*  K 5.0 4.1  CL 91* 82*  CO2 26 21  GLUCOSE 111* 96  BUN 28* 28*  CREATININE 1.28 1.32  CALCIUM 9.8 9.2   Liver Function Tests:  Recent Labs Lab 07/12/13 0127  AST 27  ALT 16  ALKPHOS 102  BILITOT 0.4  PROT 7.7  ALBUMIN 4.2   No results found for this basename: LIPASE, AMYLASE,  in the last 168 hours No results found for this basename: AMMONIA,  in the last 168 hours CBC:  Recent Labs Lab 07/07/13 2138 07/12/13 0127  WBC 9.1 17.1*  NEUTROABS 6.0 13.9*  HGB 13.6 13.4  HCT 40.1 37.4*  MCV 82.7 79.6  PLT 231 264   Cardiac Enzymes:  Recent Labs Lab 07/12/13 0128  TROPONINI <0.30    BNP (last 3 results)  Recent Labs  12/01/12 1900  PROBNP 1124.0*   CBG:  Recent Labs Lab 07/12/13 0109  GLUCAP 101*    Radiological Exams on Admission: Dg Chest 1 View  07/12/2013   CLINICAL DATA:  Altered mental status.  EXAM: CHEST - 1 VIEW  COMPARISON:  Chest radiographs performed 02/19/2013  FINDINGS: The lungs are hypoexpanded. Mild patchy right-sided airspace opacity raises concern for pneumonia, though mild interstitial edema could have a similar appearance. There is no evidence of pleural effusion or pneumothorax.  The cardiomediastinal silhouette is mildly enlarged. No acute osseous abnormalities are seen.  IMPRESSION: Lungs hypoexpanded. Mild patchy right-sided airspace opacity raises concern for pneumonia, though mild interstitial edema could have a similar appearance. Mild cardiomegaly.    Electronically Signed   By: Garald Balding M.D.   On: 07/12/2013 03:13   Ct Head Wo Contrast  07/12/2013   CLINICAL DATA:  Altered mental status.  EXAM: CT HEAD WITHOUT CONTRAST  TECHNIQUE: Contiguous axial images were obtained from the base of the skull through the vertex without intravenous contrast.  COMPARISON:  CT of the head performed 11/23/2012  FINDINGS: There is no evidence of acute infarction, mass lesion, or intra- or extra-axial hemorrhage on CT.  Prominence of the ventricles and sulci reflects mild to moderate cortical volume loss. Mild periventricular white matter change likely reflects small vessel ischemic microangiopathy.  The brainstem and fourth ventricle are within normal limits. The basal ganglia are unremarkable in appearance. The cerebral hemispheres demonstrate grossly normal gray-white differentiation. No mass effect or midline shift is seen.  There is no evidence of fracture; visualized osseous structures are unremarkable in appearance. The orbits are within normal limits. The paranasal sinuses and mastoid air cells are well-aerated. No significant soft tissue abnormalities are seen.  IMPRESSION: 1. No acute intracranial pathology seen on CT. 2. Mild to moderate cortical volume loss and scattered small vessel ischemic microangiopathy.   Electronically Signed   By: Garald Balding M.D.   On: 07/12/2013 03:19     Assessment/Plan Principal Problem:   Acute encephalopathy Active Problems:   HYPERLIPIDEMIA   HYPERTENSION   Hyponatremia   Atrial fibrillation   Chronic pain   1. Acute encephalopathy - most likely from change in pain medication. Duragesic patch was discontinued by family yesterday. At this time patient has failed swallow evaluation and I have ordered morphine 4 mg IV one dose now and when necessary every 3 hours. If patient does not pass formal swallow evaluation patient may need to be on scheduled dose of morphine. If patient passes swallow may need to be placed  on patient's home dose  of morphine. Patient otherwise is afebrile but does have leukocytosis. Check MRI brain and ammonia levels. 2. Hyponatremia - we will discontinue Zyprexa and spironolactone for now. Check urine sodium and osmolality based on which we will have further plans to treat hyponatremia. Check metabolic panel frequently. Check uric acid levels BNP TSH cortisol level to further assess the cause of hyponatremia. 3. Possible pneumonia - patient has been placed on Levaquin. Check urine Legionella and strep antigen. Suspect pneumonia could be from aspiration. Speech therapist to see. 4. Atrial fibrillation - patient is on Pradaxa but since patient has failed swallow have placed patient on heparin infusion and metoprolol IV. Patient's old records show that patient was on digoxin and not sure if patient still on digoxin check digoxin level. 5. Hypertension. 6. Hyperlipidemia. 7. History of CAD.  Patient medication list has to be reupdated. I have discussed with pharmacist. I have discussed with oncoming hospitalist about patient's condition.  Addendum -  I was told by the pharmacist that the patient is no longer on anticoagulants as per patient's daughter. I had confirmed this with patient's daughter myself. In addition patient's daughter stated that patient has been following up with Dr. Vira Blanco of [facility] and was recently trying to be weaning off his pain medications as patient was abusing his morphine. Patient also in addition had taken a lot of ibuprofen last evening. Quantity is not clear. Patient's daughter had stated that patient was recently started on spironolactone and Coreg about 2 months ago for shortness of breath in A. fib. Was taken off digoxin. Tried to reach Dr.Spivey's office at [phone] but was unable to reach Dr.Spivey. Updated Dr.Singh on coming hospitalist.  Code Status: Full code.  Family Communication: Patient's wife at the bedside.  Disposition  Plan: Admit to inpatient.    [facility] Pager [phone].  If 7PM-7AM, please contact night-coverage [email or web address] Password [email or web address] 07/12/2013, 7:08 AM

## 2013-07-12 NOTE — ED Provider Notes (Signed)
CSN: [id number]     Arrival date & time 07/12/13  0055 History   First MD Initiated Contact with Patient 07/12/13 0117     Chief Complaint  Patient presents with  . Altered Mental Status     (Consider location/radiation/quality/duration/timing/severity/associated sxs/prior Treatment) HPI Comments: Patient is a 73 year old male with history of coronary artery disease. He also has history of chronic back pain for which he is taking MS Contin. This was recently changed to a fentanyl patch 2 days ago. He started this evening with disorientation and slurred speech. He appears very shaky and agitated. The wife says he has never behaved this way before and is quite concerned. She denies any other recent illness. Patient denies any specific pain.  Patient is a 73 y.o. male presenting with altered mental status. The history is provided by the patient and the spouse.  Altered Mental Status Presenting symptoms: combativeness, confusion and disorientation   Severity:  Severe Most recent episode:  Today Episode history:  Single Duration:  5 hours Timing:  Constant Progression:  Worsening Chronicity:  New   Past Medical History  Diagnosis Date  . Hypertension   . Hyperlipidemia   . Preoperative cardiovascular examination   . Spinal stenosis, lumbar   . CAS (cerebral atherosclerosis)   . Erectile dysfunction   . Preventative health care   . Benign prostatic hypertrophy with urinary obstruction   . Family history of early CAD     male 1st degree relative <50  . Peptic ulcer disease   . Low back pain   . Superficial spreading melanoma   . Sleep apnea, obstructive     CPAP-does not use  . HA (headache)     sinus headaches  . Arthritis     knees  . Myocardial infarction 1997    Hx MI 1997 and 1998  . Anxiety   . Pneumonia 2005  . Anemia   . Depression     takes Zoloft  . Neuropathy, autonomic, idiopathic peripheral, other   . Blood transfusion 1 yr. ago    jerking,fever-after blood  transfusion  . Blood transfusion     given wrong blood  . Coronary artery disease 1998    cardiac stent/ Clearance Dr Arnoldo Morale and Einar Gip with note on chart  . Dysrhythmia     hx of atrial fib per ov note of 12/12   . Bradycardia     hx of bradycardia in past per ov note of 12/12  . GERD (gastroesophageal reflux disease)    Past Surgical History  Procedure Laterality Date  . Esophagogastroduodenoscopy  2004  . Colonoscopy  2004  . Lumbar laminectomy    . Lumbar fusion    . Knee arthroscopy      left  . Shoulder arthroscopy      right  . Knee arthroscopy      right  . Decompression of the median nerve      left wrist and hand  . Revision of tkr  2011    on left  . Coronary stent placement    . Back surgery  2005-last    lumbar x2  . Excisional total knee arthroplasty  12/25/2010    Procedure: EXCISIONAL TOTAL KNEE ARTHROPLASTY;  Surgeon: Mauri Pole;  Location: [facility];  Service: Orthopedics;  Laterality: Right;  repeat irrigation   and debridement, removal and reinsertation of spacer block  . Joint replacement  2011    left knee x 2 ; 08/2010-right knee-infected  .  I&d extremity  03/25/2011    Procedure: IRRIGATION AND DEBRIDEMENT EXTREMITY;  Surgeon: Mauri Pole, MD;  Location: [facility];  Service: Orthopedics;  Laterality: Right;  . Coronary angioplasty  [place]   . Total knee revision  05/13/2011    Procedure: TOTAL KNEE REVISION;  Surgeon: Mauri Pole, MD;  Location: [facility];  Service: Orthopedics;  Laterality: Right;  Reimplantation   Family History  Problem Relation Age of Onset  . Heart disease Father   . Heart disease Mother   . Diabetes Sister   . Heart disease Brother   . Breast cancer Brother   . Breast cancer Sister    History  Substance Use Topics  . Smoking status: Former Smoker -- 1.00 packs/day for 15 years    Types: Cigars    Quit date: 12/23/2009  . Smokeless tobacco: Never Used     Comment: smoked a pipe for 6-7 y ears  . Alcohol  Use: No    Review of Systems  Psychiatric/Behavioral: Positive for confusion.  All other systems reviewed and are negative.     Allergies  Acetaminophen  Home Medications   Prior to Admission medications   Medication Sig Start Date End Date Taking? Authorizing Provider  aspirin 81 MG tablet Take 81 mg by mouth daily.    Historical Provider, MD  Azelastine-Fluticasone (DYMISTA) 137-50 MCG/ACT SUSP Place 2 sprays into the nose 2 (two) times daily.    Historical Provider, MD  carvedilol (COREG) 6.25 MG tablet Take 6.25 mg by mouth 2 (two) times daily with a meal.    Historical Provider, MD  fentaNYL (DURAGESIC - DOSED MCG/HR) 25 MCG/HR patch Place 25 mcg onto the skin every 3 (three) days.    Historical Provider, MD  metoprolol succinate (TOPROL-XL) 100 MG 24 hr tablet Take 100 mg by mouth every morning.  03/11/13   Historical Provider, MD  morphine (MS CONTIN) 30 MG 12 hr tablet Take 30 mg by mouth every 12 (twelve) hours.    Historical Provider, MD  NITROSTAT 0.4 MG SL tablet Place 0.4 mg under the tongue every 5 (five) minutes as needed for chest pain.  05/22/12   Historical Provider, MD  OLANZapine (ZYPREXA) 5 MG tablet Take 5 mg by mouth at bedtime.    Historical Provider, MD  omeprazole-sodium bicarbonate (ZEGERID) 40-1100 MG per capsule Take 1 capsule by mouth daily before breakfast. 04/19/13   Jerene Bears, MD  PROAIR HFA 108 (90 BASE) MCG/ACT inhaler Inhale 2 puffs into the lungs every 6 (six) hours as needed for wheezing.  05/24/13   Historical Provider, MD  spironolactone (ALDACTONE) 25 MG tablet Take 25 mg by mouth daily.    Historical Provider, MD   BP 189/122  Pulse 117  Temp(Src) 97.6 F (36.4 C) (Temporal)  Resp 25  SpO2 97% Physical Exam  Nursing note and vitals reviewed. Constitutional: He is oriented to person, place, and time. He appears well-developed and well-nourished.  Patient is an elderly male who appears anxious and tremulous. He is pulling at the EKG wires and  is down and is quite restless.  HENT:  Head: Normocephalic and atraumatic.  Eyes: Pupils are equal, round, and reactive to light.  Neck: Normal range of motion. Neck supple.  Cardiovascular: Normal rate, regular rhythm and normal heart sounds.   No murmur heard. Pulmonary/Chest: Effort normal and breath sounds normal. No respiratory distress.  Abdominal: Soft. Bowel sounds are normal.  Musculoskeletal: Normal range of motion.  He exhibits no edema.  Neurological: He is alert and oriented to person, place, and time. No cranial nerve deficit. He exhibits normal muscle tone. Coordination abnormal.  Patient is quite tremulous and anxious. His speech is slurred.  Skin: Skin is warm and dry.    ED Course  Procedures (including critical care time) Labs Review Labs Reviewed  CBG MONITORING, ED - Abnormal; Notable for the following:    Glucose-Capillary 101 (*)    All other components within normal limits  CBC WITH DIFFERENTIAL  COMPREHENSIVE METABOLIC PANEL  URINALYSIS, ROUTINE W REFLEX MICROSCOPIC  URINE RAPID DRUG SCREEN (HOSP PERFORMED)  TROPONIN I  ETHANOL    Imaging Review No results found.   EKG Interpretation None      MDM   Final diagnoses:  None    Patient is a 73 year old male with history of hypertension and chronic low back pain. He presents for evaluation of altered mental status. He recently was taken off of his OxyContin and started on a fentanyl patch. He began acting acutely confused and disoriented this evening while at home. His wife states that he appears very confused and is very shaky. Workup reveals an elevated white count of 17,000 and sodium of 122. The radiologist is also reading a mild patchy airspace opacity that could be concern for pneumonia.  I suspect his confusion is related to a combination of hyponatremia and narcotic withdrawal. He is given Ativan without much relief. He was also given IV morphine. I believe he will require admission to the  hospital and spoken with Dr. Posey Pronto regarding this. He agrees to admit.    Veryl Speak, MD 07/12/13 [phone]

## 2013-07-12 NOTE — ED Notes (Signed)
Pt had fentanyl patch 106mcg/hr on x 2 days.  It was removed upon arriving in ED.

## 2013-07-12 NOTE — Progress Notes (Signed)
ANTICOAGULATION CONSULT NOTE - Initial Consult  Pharmacy Consult for heparin Indication: bridge from pradaxa  Allergies  Allergen Reactions  . Acetaminophen Other (See Comments)    LIVER INFLAMMATION IN HIGH DOSES    Patient Measurements: Height: 5\' 10"  (177.8 cm) Weight: 190 lb (86.183 kg) IBW/kg (Calculated) : 73 Heparin Dosing Weight:   Vital Signs: Temp: 98.4 F (36.9 C) (05/25 0532) Temp src: Temporal (05/25 0105) BP: 120/67 mmHg (05/25 0532) Pulse Rate: 71 (05/25 0415)  Labs:  Recent Labs  07/12/13 0127 07/12/13 0128  HGB 13.4  --   HCT 37.4*  --   PLT 264  --   CREATININE 1.32  --   TROPONINI  --  <0.30    Estimated Creatinine Clearance: 50.7 ml/min (by C-G formula based on Cr of 1.32).   Medical History: Past Medical History  Diagnosis Date  . Hypertension   . Hyperlipidemia   . Preoperative cardiovascular examination   . Spinal stenosis, lumbar   . CAS (cerebral atherosclerosis)   . Erectile dysfunction   . Preventative health care   . Benign prostatic hypertrophy with urinary obstruction   . Family history of early CAD     male 1st degree relative <50  . Peptic ulcer disease   . Low back pain   . Superficial spreading melanoma   . Sleep apnea, obstructive     CPAP-does not use  . HA (headache)     sinus headaches  . Arthritis     knees  . Myocardial infarction 1997    Hx MI 1997 and 1998  . Anxiety   . Pneumonia 2005  . Anemia   . Depression     takes Zoloft  . Neuropathy, autonomic, idiopathic peripheral, other   . Blood transfusion 1 yr. ago    jerking,fever-after blood transfusion  . Blood transfusion     given wrong blood  . Coronary artery disease 1998    cardiac stent/ Clearance Dr Arnoldo Morale and Einar Gip with note on chart  . Dysrhythmia     hx of atrial fib per ov note of 12/12   . Bradycardia     hx of bradycardia in past per ov note of 12/12  . GERD (gastroesophageal reflux disease)     Medications:  Prescriptions prior  to admission  Medication Sig Dispense Refill  . aspirin 81 MG tablet Take 81 mg by mouth daily.      . carvedilol (COREG) 6.25 MG tablet Take 6.25 mg by mouth 2 (two) times daily with a meal.      . fentaNYL (DURAGESIC - DOSED MCG/HR) 25 MCG/HR patch Place 25 mcg onto the skin every 3 (three) days.      Marland Kitchen ibuprofen (ADVIL,MOTRIN) 200 MG tablet Take 200 mg by mouth every 6 (six) hours as needed for moderate pain.      . naproxen sodium (ANAPROX) 220 MG tablet Take 220 mg by mouth 2 (two) times daily as needed (pain).      Marland Kitchen NITROSTAT 0.4 MG SL tablet Place 0.4 mg under the tongue every 5 (five) minutes as needed for chest pain.       Marland Kitchen OLANZapine (ZYPREXA) 5 MG tablet Take 5 mg by mouth at bedtime.      Marland Kitchen omeprazole-sodium bicarbonate (ZEGERID) 40-1100 MG per capsule Take 1 capsule by mouth daily before breakfast.  90 capsule  3  . PROAIR HFA 108 (90 BASE) MCG/ACT inhaler Inhale 2 puffs into the lungs every 6 (six) hours as needed  for wheezing.       Marland Kitchen spironolactone (ALDACTONE) 25 MG tablet Take 25 mg by mouth daily.        Assessment: Med rec technician has spoken with patient's daughter who takes care of all his medications. She verifies  that he has not been on oral anticoagulation in more than a year as it was DC'ed.      Plan:  After speaking with admitting physician. Heparin bridge has been DC.   Curlene Dolphin 07/12/2013,7:42 AM

## 2013-07-12 NOTE — ED Notes (Signed)
Pt to go to 4N.  Will go to floor on monitor with RN accompanying.

## 2013-07-12 NOTE — Evaluation (Signed)
Clinical/Bedside Swallow Evaluation Patient Details  Name: [name] MRN: [id number] Date of Birth: [birth year]  Today's Date: 07/12/2013 Time: 0925-0959 SLP Time Calculation (min): 34 min  Past Medical History:  Past Medical History  Diagnosis Date  . Hypertension   . Hyperlipidemia   . Preoperative cardiovascular examination   . Spinal stenosis, lumbar   . CAS (cerebral atherosclerosis)   . Erectile dysfunction   . Preventative health care   . Benign prostatic hypertrophy with urinary obstruction   . Family history of early CAD     male 1st degree relative <50  . Peptic ulcer disease   . Low back pain   . Superficial spreading melanoma   . Sleep apnea, obstructive     CPAP-does not use  . HA (headache)     sinus headaches  . Arthritis     knees  . Myocardial infarction 1997    Hx MI 1997 and 1998  . Anxiety   . Pneumonia 2005  . Anemia   . Depression     takes Zoloft  . Neuropathy, autonomic, idiopathic peripheral, other   . Blood transfusion 1 yr. ago    jerking,fever-after blood transfusion  . Blood transfusion     given wrong blood  . Coronary artery disease 1998    cardiac stent/ Clearance Dr Arnoldo Morale and Einar Gip with note on chart  . Dysrhythmia     hx of atrial fib per ov note of 12/12   . Bradycardia     hx of bradycardia in past per ov note of 12/12  . GERD (gastroesophageal reflux disease)    Past Surgical History:  Past Surgical History  Procedure Laterality Date  . Esophagogastroduodenoscopy  2004  . Colonoscopy  2004  . Lumbar laminectomy    . Lumbar fusion    . Knee arthroscopy      left  . Shoulder arthroscopy      right  . Knee arthroscopy      right  . Decompression of the median nerve      left wrist and hand  . Revision of tkr  2011    on left  . Coronary stent placement    . Back surgery  2005-last    lumbar x2  . Excisional total knee arthroplasty  12/25/2010    Procedure: EXCISIONAL TOTAL KNEE ARTHROPLASTY;  Surgeon:  Mauri Pole;  Location: [facility];  Service: Orthopedics;  Laterality: Right;  repeat irrigation   and debridement, removal and reinsertation of spacer block  . Joint replacement  2011    left knee x 2 ; 08/2010-right knee-infected  . I&d extremity  03/25/2011    Procedure: IRRIGATION AND DEBRIDEMENT EXTREMITY;  Surgeon: Mauri Pole, MD;  Location: [facility];  Service: Orthopedics;  Laterality: Right;  . Coronary angioplasty  [place]   . Total knee revision  05/13/2011    Procedure: TOTAL KNEE REVISION;  Surgeon: Mauri Pole, MD;  Location: [facility];  Service: Orthopedics;  Laterality: Right;  Reimplantation   HPI:  73 yo male adm to [facility] with mental status changes- pt has chronic back pain and takes fentanyl.  Also has h/o Barrett's esophagus s/p dilatation and takes a PPI.  CT head negative.  ? pna per CXR - pt reports he is prone to getting pna.     Assessment / Plan / Recommendation Clinical Impression  Pt presents with mild-moderate dysarthria impacting his oral manipulation/mastication abilities.  Pt able to  self feed cracker, applesauce, water and juice.  No indications of aspiration or airway compromise.  Belch noted after completion of snack which pt states was just air - not fluid.  Pt has h/o Barrett's esophagus and reflux - reports had throat dilated approx 7 years ago with resolution of frequent choking.  SlP skiilled intervention included educating pt to aspiration precautions, reinforcing effective compensation strategies using teach back.  Recommend mechanical soft/thin with strict aspiration precautions.  SLP to follow up briefly for education/tolerance given h/o esophageal dysphagia and pulmonary issue - suspect primary asp risk from esophageal dysphagia.      Aspiration Risk  Mild    Diet Recommendation Dysphagia 3 (Mechanical Soft);Thin liquid   Liquid Administration via: Cup;Straw Medication Administration: Whole meds with liquid Supervision: Patient able to self  feed Compensations: Slow rate;Small sips/bites;Check for pocketing Postural Changes and/or Swallow Maneuvers: Seated upright 90 degrees;Upright 30-60 min after meal    Other  Recommendations Oral Care Recommendations: Oral care BID   Follow Up Recommendations       Frequency and Duration min 1 x/week  1 week   Pertinent Vitals/Pain Afebrile, decreased      Swallow Study Prior Functional Status   see [place] Date of Onset: 07/12/13 HPI: 73 yo male adm to [facility] with mental status changes- pt has chronic back pain and takes fentanyl.  Also has h/o Barrett's esophagus s/p dilatation and takes a PPI.  CT head negative.  ? pna per CXR - pt reports he is prone to getting pna.   Type of Study: Bedside swallow evaluation Diet Prior to this Study: NPO Temperature Spikes Noted: No Respiratory Status: Room air Behavior/Cognition: Cooperative;Pleasant mood;Alert Oral Cavity - Dentition: Dentures, top;Dentures, bottom Self-Feeding Abilities: Able to feed self Patient Positioning: Upright in bed Baseline Vocal Quality: Clear Volitional Cough: Strong Volitional Swallow: Able to elicit    Oral/Motor/Sensory Function Overall Oral Motor/Sensory Function:  (dysarthric, otherwise no focal cn deficits)   Ice Chips Ice chips: Not tested   Thin Liquid Thin Liquid: Within functional limits Presentation: Cup;Spoon;Self Fed    Nectar Thick Nectar Thick Liquid: Not tested   Honey Thick Honey Thick Liquid: Not tested   Puree Puree: Within functional limits Presentation: Self Fed;Spoon   Solid   GO    Solid: Impaired Presentation: Self Fed Oral Phase Impairments: Reduced lingual movement/coordination;Impaired anterior to posterior transit Oral Phase Functional Implications: Oral residue;Other (comment) (minimal oral residuals cleared with liquid swallows)       [place], [place] [facility] SLP [phone]

## 2013-07-12 NOTE — Progress Notes (Addendum)
ANTIBIOTIC CONSULT NOTE - INITIAL  Pharmacy Consult for levaquin Indication: pneumonia  Allergies  Allergen Reactions  . Acetaminophen Other (See Comments)    LIVER INFLAMMATION IN HIGH DOSES    Patient Measurements: Height: 5\' 10"  (177.8 cm) Weight: 190 lb (86.183 kg) IBW/kg (Calculated) : 73 Adjusted Body Weight:   Vital Signs: Temp: 98.4 F (36.9 C) (05/25 0532) Temp src: Temporal (05/25 0105) BP: 120/67 mmHg (05/25 0532) Pulse Rate: 71 (05/25 0415) Intake/Output from previous day:   Intake/Output from this shift:    Labs:  Recent Labs  07/12/13 0127  WBC 17.1*  HGB 13.4  PLT 264  CREATININE 1.32   Estimated Creatinine Clearance: 50.7 ml/min (by C-G formula based on Cr of 1.32). No results found for this basename: VANCOTROUGH, VANCOPEAK, VANCORANDOM, GENTTROUGH, GENTPEAK, GENTRANDOM, TOBRATROUGH, TOBRAPEAK, TOBRARND, AMIKACINPEAK, AMIKACINTROU, AMIKACIN,  in the last 72 hours   Microbiology: No results found for this or any previous visit (from the past 720 hour(s)).  Medical History: Past Medical History  Diagnosis Date  . Hypertension   . Hyperlipidemia   . Preoperative cardiovascular examination   . Spinal stenosis, lumbar   . CAS (cerebral atherosclerosis)   . Erectile dysfunction   . Preventative health care   . Benign prostatic hypertrophy with urinary obstruction   . Family history of early CAD     male 1st degree relative <50  . Peptic ulcer disease   . Low back pain   . Superficial spreading melanoma   . Sleep apnea, obstructive     CPAP-does not use  . HA (headache)     sinus headaches  . Arthritis     knees  . Myocardial infarction 1997    Hx MI 1997 and 1998  . Anxiety   . Pneumonia 2005  . Anemia   . Depression     takes Zoloft  . Neuropathy, autonomic, idiopathic peripheral, other   . Blood transfusion 1 yr. ago    jerking,fever-after blood transfusion  . Blood transfusion     given wrong blood  . Coronary artery disease  1998    cardiac stent/ Clearance Dr Arnoldo Morale and Einar Gip with note on chart  . Dysrhythmia     hx of atrial fib per ov note of 12/12   . Bradycardia     hx of bradycardia in past per ov note of 12/12  . GERD (gastroesophageal reflux disease)     Medications:  Prescriptions prior to admission  Medication Sig Dispense Refill  . aspirin 81 MG tablet Take 81 mg by mouth daily.      . Azelastine-Fluticasone (DYMISTA) 137-50 MCG/ACT SUSP Place 2 sprays into the nose 2 (two) times daily.      . carvedilol (COREG) 6.25 MG tablet Take 6.25 mg by mouth 2 (two) times daily with a meal.      . fentaNYL (DURAGESIC - DOSED MCG/HR) 25 MCG/HR patch Place 25 mcg onto the skin every 3 (three) days.      . metoprolol succinate (TOPROL-XL) 100 MG 24 hr tablet Take 100 mg by mouth every morning.       Marland Kitchen NITROSTAT 0.4 MG SL tablet Place 0.4 mg under the tongue every 5 (five) minutes as needed for chest pain.       Marland Kitchen OLANZapine (ZYPREXA) 5 MG tablet Take 5 mg by mouth at bedtime.      Marland Kitchen omeprazole-sodium bicarbonate (ZEGERID) 40-1100 MG per capsule Take 1 capsule by mouth daily before breakfast.  90 capsule  3  . PROAIR HFA 108 (90 BASE) MCG/ACT inhaler Inhale 2 puffs into the lungs every 6 (six) hours as needed for wheezing.       Marland Kitchen spironolactone (ALDACTONE) 25 MG tablet Take 25 mg by mouth daily.       Assessment:73 yo male with hx cad and chronic back pain . Started fentanyl patch 2 days ago has now been removed by his son.  Disoriented and slurring speech. Concern on CXR for pna. levaquin for CAP. WBC elevated at 17.1   Goal of Therapy:    Plan:  levaquin 750 q24h   Curlene Dolphin 07/12/2013,7:21 AM

## 2013-07-12 NOTE — Progress Notes (Signed)
RN received an alert from tele x2, SB at 40 bpm. Pt was asleep both times, with no signs of distress.

## 2013-07-12 NOTE — Progress Notes (Signed)
Patient Demographics  [name], is a 73 y.o. male, DOB - 24-Sep-1940, OFB:[id number]  Admit date - 07/12/2013   Admitting Physician Rise Patience, MD  Outpatient Primary MD for the patient is Tamsen Roers, MD  LOS - 0   Chief Complaint  Patient presents with  . Altered Mental Status           Subjective:   [name] today has, No headache, No chest pain, No abdominal pain - No Nausea, No new weakness tingling or numbness, No Cough - SOB. Chronic low back pain.    Assessment & Plan    1. Due to metabolic encephalopathy most certainly due to abrupt stoppage of high-dose morphine. We'll place him on a lower dose scheduled long-acting morphine along with Lortab for breakthrough chronic back pain. Head CT unremarkable. He is afebrile with no headache or neck stiffness. Monitor with supportive care. MRI upon admission has been ordered and pending we'll monitor. He is on aspirin for now.   2. Dehydration with hyponatremia - urine has improved after IV fluid bolus in the ER, continue gentle normal saline, monitor BMP closely, pending osmolality and serum and urine. Was on Aldactone which has been held.    3. Mild ARF due to  #2 above. Avoid nephrotoxins hydrate and monitor.    4. Chronic low back pain. Plan as in #1 above.    5. Hypertension. We'll continue him on Coreg.    6. Suppression pneumonia. Being seen by speech, diet has been adjusted currently on dysphagia 3 diet with aspiration precautions, on Levaquin Flagyl which will be continued.     Code Status: Full  Family Communication: Wife bedside  Disposition Plan:  home   Procedures CT head, chest x-ray, MRI brain pending   Consults      Medications  Scheduled Meds: . aspirin  81 mg Oral Daily  .  levofloxacin (LEVAQUIN) IV  750 mg Intravenous Q24H  . metoprolol  5 mg Intravenous 4 times per day  . metronidazole  500 mg Intravenous Q8H  . morphine  15 mg Oral Q12H  . pantoprazole  40 mg Oral Daily  . sodium chloride  3 mL Intravenous Q12H   Continuous Infusions: . sodium chloride     PRN Meds:.HYDROcodone-acetaminophen, nitroGLYCERIN  DVT Prophylaxis    Heparin ordered  Lab Results  Component Value Date   PLT 264 07/12/2013    Antibiotics    Anti-infectives   Start     Dose/Rate Route Frequency Ordered Stop   07/12/13 0800  metroNIDAZOLE (FLAGYL) IVPB 500 mg     500 mg 100 mL/hr over 60 Minutes Intravenous Every 8 hours 07/12/13 0716     07/12/13 0800  levofloxacin (LEVAQUIN) IVPB 750 mg     750 mg 100 mL/hr over 90 Minutes Intravenous Every 24 hours 07/12/13 0725            Objective:   Filed Vitals:   07/12/13 0440 07/12/13 0532 07/12/13 0752 07/12/13 1033  BP: 141/83 120/67 133/86 114/78  Pulse:   74 55  Temp:  98.4 F (36.9 C) 97.5 F (36.4 C) 97.5 F (36.4 C)  TempSrc:   Oral Oral  Resp: 21 20 18 20   Height:  5\' 10"  (1.778  m)    Weight:  86.183 kg (190 lb)    SpO2:   98% 97%    Wt Readings from Last 3 Encounters:  07/12/13 86.183 kg (190 lb)  06/30/13 87.091 kg (192 lb)  06/29/13 86.183 kg (190 lb)    No intake or output data in the 24 hours ending 07/12/13 1130   Physical Exam  Awake, mildly confused, No new F.N deficits, Normal affect [place].AT,PERRAL Supple Neck,No JVD, No cervical lymphadenopathy appriciated.  Symmetrical Chest wall movement, Good air movement bilaterally, CTAB RRR,No Gallops,Rubs or new Murmurs, No Parasternal Heave +ve B.Sounds, Abd Soft, No tenderness, No organomegaly appriciated, No rebound - guarding or rigidity. No Cyanosis, Clubbing or edema, No new Rash or bruise      Data Review   Micro Results No results found for this or any previous visit (from the past 240 hour(s)).  Radiology Reports Dg Chest 1  View  07/12/2013   CLINICAL DATA:  Altered mental status.  EXAM: CHEST - 1 VIEW  COMPARISON:  Chest radiographs performed 02/19/2013  FINDINGS: The lungs are hypoexpanded. Mild patchy right-sided airspace opacity raises concern for pneumonia, though mild interstitial edema could have a similar appearance. There is no evidence of pleural effusion or pneumothorax.  The cardiomediastinal silhouette is mildly enlarged. No acute osseous abnormalities are seen.  IMPRESSION: Lungs hypoexpanded. Mild patchy right-sided airspace opacity raises concern for pneumonia, though mild interstitial edema could have a similar appearance. Mild cardiomegaly.   Electronically Signed   By: Garald Balding M.D.   On: 07/12/2013 03:13   Ct Head Wo Contrast  07/12/2013   CLINICAL DATA:  Altered mental status.  EXAM: CT HEAD WITHOUT CONTRAST  TECHNIQUE: Contiguous axial images were obtained from the base of the skull through the vertex without intravenous contrast.  COMPARISON:  CT of the head performed 11/23/2012  FINDINGS: There is no evidence of acute infarction, mass lesion, or intra- or extra-axial hemorrhage on CT.  Prominence of the ventricles and sulci reflects mild to moderate cortical volume loss. Mild periventricular white matter change likely reflects small vessel ischemic microangiopathy.  The brainstem and fourth ventricle are within normal limits. The basal ganglia are unremarkable in appearance. The cerebral hemispheres demonstrate grossly normal gray-white differentiation. No mass effect or midline shift is seen.  There is no evidence of fracture; visualized osseous structures are unremarkable in appearance. The orbits are within normal limits. The paranasal sinuses and mastoid air cells are well-aerated. No significant soft tissue abnormalities are seen.  IMPRESSION: 1. No acute intracranial pathology seen on CT. 2. Mild to moderate cortical volume loss and scattered small vessel ischemic microangiopathy.    Electronically Signed   By: Garald Balding M.D.   On: 07/12/2013 03:19    CBC  Recent Labs Lab 07/07/13 2138 07/12/13 0127  WBC 9.1 17.1*  HGB 13.6 13.4  HCT 40.1 37.4*  PLT 231 264  MCV 82.7 79.6  MCH 28.0 28.5  MCHC 33.9 35.8  RDW 14.6 14.3  LYMPHSABS 2.3 2.2  MONOABS 0.5 0.8  EOSABS 0.3 0.1  BASOSABS 0.0 0.0    Chemistries   Recent Labs Lab 07/07/13 2138 07/12/13 0127 07/12/13 0835  NA 131* 122* 124*  K 5.0 4.1 3.8  CL 91* 82* 85*  CO2 26 21 23   GLUCOSE 111* 96 81  BUN 28* 28* 26*  CREATININE 1.28 1.32 1.39*  CALCIUM 9.8 9.2 9.0  AST  --  27 31  ALT  --  16 15  ALKPHOS  --  102 86  BILITOT  --  0.4 0.5   ------------------------------------------------------------------------------------------------------------------ estimated creatinine clearance is 48.1 ml/min (by C-G formula based on Cr of 1.39). ------------------------------------------------------------------------------------------------------------------ No results found for this basename: HGBA1C,  in the last 72 hours ------------------------------------------------------------------------------------------------------------------ No results found for this basename: CHOL, HDL, LDLCALC, TRIG, CHOLHDL, LDLDIRECT,  in the last 72 hours ------------------------------------------------------------------------------------------------------------------  Recent Labs  07/12/13 0835  TSH 1.290   ------------------------------------------------------------------------------------------------------------------ No results found for this basename: VITAMINB12, FOLATE, FERRITIN, TIBC, IRON, RETICCTPCT,  in the last 72 hours  Coagulation profile No results found for this basename: INR, PROTIME,  in the last 168 hours  No results found for this basename: DDIMER,  in the last 72 hours  Cardiac Enzymes  Recent Labs Lab 07/12/13 0128 07/12/13 0835  TROPONINI <0.30 <0.30    ------------------------------------------------------------------------------------------------------------------ No components found with this basename: POCBNP,      Time Spent in minutes   35   Thurnell Lose M.D on 07/12/2013 at 11:30 AM  Between 7am to 7pm - Pager - [phone]  After 7pm go to [email or web address] - password [email or web address]  And look for the night coverage person covering for me after hours  [facility] Office  [phone]   **Disclaimer: This note may have been dictated with voice recognition software. Similar sounding words can inadvertently be transcribed and this note may contain transcription errors which may not have been corrected upon publication of note.**

## 2013-07-12 NOTE — ED Notes (Signed)
Patient with disorientation since yesterday am per wife.  Patient is normally CAOx3, very clear per wife.  Patient is very agitated and wants to urinate multiple times during EMS transport.

## 2013-07-12 NOTE — ED Notes (Signed)
Report given to RN on 4N. There is a question regarding appropriateness for that unit. Will await another bed assignment.

## 2013-07-13 ENCOUNTER — Inpatient Hospital Stay (HOSPITAL_COMMUNITY): Payer: Medicare Other

## 2013-07-13 ENCOUNTER — Ambulatory Visit: Payer: Medicare Other | Admitting: Internal Medicine

## 2013-07-13 LAB — BASIC METABOLIC PANEL WITH GFR
BUN: 26 mg/dL — ABNORMAL HIGH (ref 6–23)
CO2: 24 meq/L (ref 19–32)
Calcium: 9 mg/dL (ref 8.4–10.5)
Chloride: 87 meq/L — ABNORMAL LOW (ref 96–112)
Creatinine, Ser: 1.46 mg/dL — ABNORMAL HIGH (ref 0.50–1.35)
GFR calc Af Amer: 53 mL/min — ABNORMAL LOW
GFR calc non Af Amer: 46 mL/min — ABNORMAL LOW
Glucose, Bld: 83 mg/dL (ref 70–99)
Potassium: 3.8 meq/L (ref 3.7–5.3)
Sodium: 124 meq/L — ABNORMAL LOW (ref 137–147)

## 2013-07-13 LAB — CBC
HCT: 33.7 % — ABNORMAL LOW (ref 39.0–52.0)
HEMOGLOBIN: 11.5 g/dL — AB (ref 13.0–17.0)
MCH: 27.8 pg (ref 26.0–34.0)
MCHC: 34.1 g/dL (ref 30.0–36.0)
MCV: 81.4 fL (ref 78.0–100.0)
Platelets: 198 10*3/uL (ref 150–400)
RBC: 4.14 MIL/uL — ABNORMAL LOW (ref 4.22–5.81)
RDW: 14.8 % (ref 11.5–15.5)
WBC: 8.9 10*3/uL (ref 4.0–10.5)

## 2013-07-13 LAB — LEGIONELLA ANTIGEN, URINE: Legionella Antigen, Urine: NEGATIVE

## 2013-07-13 LAB — GLUCOSE, CAPILLARY
GLUCOSE-CAPILLARY: 111 mg/dL — AB (ref 70–99)
GLUCOSE-CAPILLARY: 112 mg/dL — AB (ref 70–99)
Glucose-Capillary: 122 mg/dL — ABNORMAL HIGH (ref 70–99)
Glucose-Capillary: 123 mg/dL — ABNORMAL HIGH (ref 70–99)

## 2013-07-13 MED ORDER — LEVOFLOXACIN IN D5W 750 MG/150ML IV SOLN
750.0000 mg | INTRAVENOUS | Status: DC
  Administered 2013-07-15: 750 mg via INTRAVENOUS
  Filled 2013-07-13 (×2): qty 150

## 2013-07-13 MED ORDER — LORAZEPAM 2 MG/ML IJ SOLN
INTRAMUSCULAR | Status: AC
  Filled 2013-07-13: qty 1

## 2013-07-13 MED ORDER — DIPHENHYDRAMINE HCL 25 MG PO CAPS
25.0000 mg | ORAL_CAPSULE | Freq: Once | ORAL | Status: AC
  Administered 2013-07-13: 25 mg via ORAL
  Filled 2013-07-13: qty 1

## 2013-07-13 MED ORDER — LORAZEPAM 2 MG/ML IJ SOLN
1.0000 mg | Freq: Once | INTRAMUSCULAR | Status: AC
  Administered 2013-07-13: 1 mg via INTRAVENOUS

## 2013-07-13 MED ORDER — FUROSEMIDE 40 MG PO TABS
40.0000 mg | ORAL_TABLET | Freq: Two times a day (BID) | ORAL | Status: DC
  Administered 2013-07-13 – 2013-07-14 (×2): 40 mg via ORAL
  Filled 2013-07-13 (×4): qty 1

## 2013-07-13 NOTE — Progress Notes (Signed)
Pt brought down to MR for scan, pt went into scanner first time, was claustrophobic and didn't want to continue.  RN came down and administered meds, pt again attempted and pt refused scan again.  Pt was sent back up to the floor.

## 2013-07-13 NOTE — Progress Notes (Signed)
Patient returned from MRI.  RN read MRI not about patient refusing procedure.  Patient does not remember refusing MRI and seems to have no short term memory.  Every time this RN goes into patient's room he asks the same questions and has no recollection of previous talks.  Kizzie Bane, RN

## 2013-07-13 NOTE — Progress Notes (Signed)
CARE MANAGEMENT NOTE 07/13/2013  Patient:  [name], [name]   Account Number:  [id number]  Date Initiated:  07/13/2013  Documentation initiated by:  Olga Coaster  Subjective/Objective Assessment:   ADMITTED WITH ALTERED MENTAL STATUS     Action/Plan:   CM FOLLOWING FOR DCP   Anticipated DC Date:  07/16/2013   Anticipated DC Plan: POSSIBLY HOME/SELF CARE    DC Planning Services  CM consult         Status of service:  In process, will continue to follow Medicare Important Message given?  NA - LOS <3 / Initial given by admissions (If response is "NO", the following Medicare IM given date fields will be blank)  Per UR Regulation:  Reviewed for med. necessity/level of care/duration of stay  Comments:  07/13/2013-B [facility] RN,BSN,MHA [phone]

## 2013-07-13 NOTE — Consult Note (Signed)
Reason for Consult:Hyponatremia, low GFR Referring Physician: Dr. Unk Pinto is an 73 y.o. male.  HPI:  73 yr male with extensive PMH including CAD with stent, and 2 MIs, HTN, DJD s/p both knee TKRs, one x2 and subsequent infx and removal/spacer, hx LBP with 2 op and has spinal stenosis, hyperlipidemia, Cerebral atherosclerosis, Depression, peripheral neuropathy, GERD, OSA but does not use mask, EtOH in past, DJD, shoulder surgery, now admitted with several days of progressive confusion ?related to changing from MS to Fentanyl.  Also on Zyprexa.  On Spironolactone also.  Takes bottles of NSAIDs at a time (Ibuphofen and Alleve).  Cr has ^1.2 to 1.4.  Hx of UTIs, renal stones.  No FH of renal dz, but hx of CV disease.  No early hearing , eye or ms skel defects in family.  He has nocturia x 3-4.  Wife says confusion is progressive and may have been mild before.  ? pneu on admit. Constitutional: wife is historian and he contributes but is confused and does not remember freq.Marland Kitchen  He is not sure why he is here Eyes: negative Ears, nose, mouth, throat, and face: dentures Respiratory: used to smoke, but quit 5 yr ago Cardiovascular: see Hx, no edema, PND, orthop, CP Gastrointestinal: Indigestion freq Genitourinary:as above Integument/breast: hx melanoma on face Hematologic/lymphatic: negative Musculoskeletal:as above, this is why he has chronic pain Neurological: as above Behavioral/Psych: as above, usually he is better than her , as she has early dementia Allergic/Immunologic: ? tylenol    Past Medical History  Diagnosis Date  . Hypertension   . Hyperlipidemia   . Preoperative cardiovascular examination   . Spinal stenosis, lumbar   . CAS (cerebral atherosclerosis)   . Erectile dysfunction   . Preventative health care   . Benign prostatic hypertrophy with urinary obstruction   . Family history of early CAD     male 1st degree relative <50  . Peptic ulcer disease   . Low back  pain   . Superficial spreading melanoma   . Sleep apnea, obstructive     CPAP-does not use  . HA (headache)     sinus headaches  . Arthritis     knees  . Myocardial infarction 1997    Hx MI 1997 and 1998  . Anxiety   . Pneumonia 2005  . Anemia   . Depression     takes Zoloft  . Neuropathy, autonomic, idiopathic peripheral, other   . Blood transfusion 1 yr. ago    jerking,fever-after blood transfusion  . Blood transfusion     given wrong blood  . Coronary artery disease 1998    cardiac stent/ Clearance Dr Arnoldo Morale and Einar Gip with note on chart  . Dysrhythmia     hx of atrial fib per ov note of 12/12   . Bradycardia     hx of bradycardia in past per ov note of 12/12  . GERD (gastroesophageal reflux disease)     Past Surgical History  Procedure Laterality Date  . Esophagogastroduodenoscopy  2004  . Colonoscopy  2004  . Lumbar laminectomy    . Lumbar fusion    . Knee arthroscopy      left  . Shoulder arthroscopy      right  . Knee arthroscopy      right  . Decompression of the median nerve      left wrist and hand  . Revision of tkr  2011    on left  . Coronary stent  placement    . Back surgery  2005-last    lumbar x2  . Excisional total knee arthroplasty  12/25/2010    Procedure: EXCISIONAL TOTAL KNEE ARTHROPLASTY;  Surgeon: Shelda Pal;  Location: [facility];  Service: Orthopedics;  Laterality: Right;  repeat irrigation   and debridement, removal and reinsertation of spacer block  . Joint replacement  2011    left knee x 2 ; 08/2010-right knee-infected  . I&d extremity  03/25/2011    Procedure: IRRIGATION AND DEBRIDEMENT EXTREMITY;  Surgeon: Shelda Pal, MD;  Location: [facility];  Service: Orthopedics;  Laterality: Right;  . Coronary angioplasty  1998    stents 1998   . Total knee revision  05/13/2011    Procedure: TOTAL KNEE REVISION;  Surgeon: Shelda Pal, MD;  Location: [facility];  Service: Orthopedics;  Laterality: Right;  Reimplantation    Family History   Problem Relation Age of Onset  . Heart disease Father   . Heart disease Mother   . Diabetes Sister   . Heart disease Brother   . Breast cancer Brother   . Breast cancer Sister     Social History:  reports that he quit smoking about 3 years ago. His smoking use included Cigars. He has never used smokeless tobacco. He reports that he does not drink alcohol or use illicit drugs.  Allergies:  Allergies  Allergen Reactions  . Acetaminophen Other (See Comments)    LIVER INFLAMMATION IN HIGH DOSES    Medications:  I have reviewed the patient's current medications. Prior to Admission:  Prescriptions prior to admission  Medication Sig Dispense Refill  . aspirin 81 MG tablet Take 81 mg by mouth daily.      . carvedilol (COREG) 6.25 MG tablet Take 6.25 mg by mouth 2 (two) times daily with a meal.      . fentaNYL (DURAGESIC - DOSED MCG/HR) 25 MCG/HR patch Place 25 mcg onto the skin every 3 (three) days.      Marland Kitchen ibuprofen (ADVIL,MOTRIN) 200 MG tablet Take 200 mg by mouth every 6 (six) hours as needed for moderate pain.      . naproxen sodium (ANAPROX) 220 MG tablet Take 220 mg by mouth 2 (two) times daily as needed (pain).      Marland Kitchen NITROSTAT 0.4 MG SL tablet Place 0.4 mg under the tongue every 5 (five) minutes as needed for chest pain.       Marland Kitchen OLANZapine (ZYPREXA) 5 MG tablet Take 5 mg by mouth at bedtime.      Marland Kitchen omeprazole-sodium bicarbonate (ZEGERID) 40-1100 MG per capsule Take 1 capsule by mouth daily before breakfast.  90 capsule  3  . PROAIR HFA 108 (90 BASE) MCG/ACT inhaler Inhale 2 puffs into the lungs every 6 (six) hours as needed for wheezing.       Marland Kitchen spironolactone (ALDACTONE) 25 MG tablet Take 25 mg by mouth daily.        Results for orders placed during the hospital encounter of 07/12/13 (from the past 48 hour(s))  CBG MONITORING, ED     Status: Abnormal   Collection Time    07/12/13  1:09 AM      Result Value Ref Range   Glucose-Capillary 101 (*) 70 - 99 mg/dL  CBC WITH  DIFFERENTIAL     Status: Abnormal   Collection Time    07/12/13  1:27 AM      Result Value Ref Range   WBC 17.1 (*) 4.0 - 10.5 K/uL  RBC 4.70  4.22 - 5.81 MIL/uL   Hemoglobin 13.4  13.0 - 17.0 g/dL   HCT 37.4 (*) 39.0 - 52.0 %   MCV 79.6  78.0 - 100.0 fL   MCH 28.5  26.0 - 34.0 pg   MCHC 35.8  30.0 - 36.0 g/dL   RDW 14.3  11.5 - 15.5 %   Platelets 264  150 - 400 K/uL   Neutrophils Relative % 81 (*) 43 - 77 %   Neutro Abs 13.9 (*) 1.7 - 7.7 K/uL   Lymphocytes Relative 13  12 - 46 %   Lymphs Abs 2.2  0.7 - 4.0 K/uL   Monocytes Relative 5  3 - 12 %   Monocytes Absolute 0.8  0.1 - 1.0 K/uL   Eosinophils Relative 1  0 - 5 %   Eosinophils Absolute 0.1  0.0 - 0.7 K/uL   Basophils Relative 0  0 - 1 %   Basophils Absolute 0.0  0.0 - 0.1 K/uL  COMPREHENSIVE METABOLIC PANEL     Status: Abnormal   Collection Time    07/12/13  1:27 AM      Result Value Ref Range   Sodium 122 (*) 137 - 147 mEq/L   Potassium 4.1  3.7 - 5.3 mEq/L   Chloride 82 (*) 96 - 112 mEq/L   CO2 21  19 - 32 mEq/L   Glucose, Bld 96  70 - 99 mg/dL   BUN 28 (*) 6 - 23 mg/dL   Creatinine, Ser 1.32  0.50 - 1.35 mg/dL   Calcium 9.2  8.4 - 10.5 mg/dL   Total Protein 7.7  6.0 - 8.3 g/dL   Albumin 4.2  3.5 - 5.2 g/dL   AST 27  0 - 37 U/L   ALT 16  0 - 53 U/L   Alkaline Phosphatase 102  39 - 117 U/L   Total Bilirubin 0.4  0.3 - 1.2 mg/dL   GFR calc non Af Amer 51 (*) >90 mL/min   GFR calc Af Amer 60 (*) >90 mL/min   Comment: (NOTE)     The eGFR has been calculated using the CKD EPI equation.     This calculation has not been validated in all clinical situations.     eGFR's persistently <90 mL/min signify possible Chronic Kidney     Disease.  URINALYSIS, ROUTINE W REFLEX MICROSCOPIC     Status: Abnormal   Collection Time    07/12/13  1:27 AM      Result Value Ref Range   Color, Urine YELLOW  YELLOW   APPearance CLEAR  CLEAR   Specific Gravity, Urine 1.014  1.005 - 1.030   pH 8.0  5.0 - 8.0   Glucose, UA NEGATIVE   NEGATIVE mg/dL   Hgb urine dipstick TRACE (*) NEGATIVE   Bilirubin Urine NEGATIVE  NEGATIVE   Ketones, ur NEGATIVE  NEGATIVE mg/dL   Protein, ur 30 (*) NEGATIVE mg/dL   Urobilinogen, UA 0.2  0.0 - 1.0 mg/dL   Nitrite NEGATIVE  NEGATIVE   Leukocytes, UA NEGATIVE  NEGATIVE  URINE RAPID DRUG SCREEN (HOSP PERFORMED)     Status: Abnormal   Collection Time    07/12/13  1:27 AM      Result Value Ref Range   Opiates POSITIVE (*) NONE DETECTED   Cocaine NONE DETECTED  NONE DETECTED   Benzodiazepines NONE DETECTED  NONE DETECTED   Amphetamines NONE DETECTED  NONE DETECTED   Tetrahydrocannabinol NONE DETECTED  NONE DETECTED  Barbiturates NONE DETECTED  NONE DETECTED   Comment:            DRUG SCREEN FOR MEDICAL PURPOSES     ONLY.  IF CONFIRMATION IS NEEDED     FOR ANY PURPOSE, NOTIFY LAB     WITHIN 5 DAYS.                LOWEST DETECTABLE LIMITS     FOR URINE DRUG SCREEN     Drug Class       Cutoff (ng/mL)     Amphetamine      1000     Barbiturate      200     Benzodiazepine   481     Tricyclics       856     Opiates          300     Cocaine          300     THC              50  ETHANOL     Status: None   Collection Time    07/12/13  1:27 AM      Result Value Ref Range   Alcohol, Ethyl (B) <11  0 - 11 mg/dL   Comment:            LOWEST DETECTABLE LIMIT FOR     SERUM ALCOHOL IS 11 mg/dL     FOR MEDICAL PURPOSES ONLY  URINE MICROSCOPIC-ADD ON     Status: None   Collection Time    07/12/13  1:27 AM      Result Value Ref Range   WBC, UA 0-2  <3 WBC/hpf   RBC / HPF 3-6  <3 RBC/hpf   Bacteria, UA RARE  RARE  TROPONIN I     Status: None   Collection Time    07/12/13  1:28 AM      Result Value Ref Range   Troponin I <0.30  <0.30 ng/mL   Comment:            Due to the release kinetics of cTnI,     a negative result within the first hours     of the onset of symptoms does not rule out     myocardial infarction with certainty.     If myocardial infarction is still suspected,      repeat the test at appropriate intervals.  BLOOD GAS, ARTERIAL     Status: Abnormal   Collection Time    07/12/13  6:15 AM      Result Value Ref Range   FIO2 0.21     pH, Arterial 7.503 (*) 7.350 - 7.450   pCO2 arterial 27.5 (*) 35.0 - 45.0 mmHg   pO2, Arterial 93.4  80.0 - 100.0 mmHg   Bicarbonate 21.4  20.0 - 24.0 mEq/L   TCO2 22.3  0 - 100 mmol/L   Acid-base deficit 1.4  0.0 - 2.0 mmol/L   O2 Saturation 97.8     Patient temperature 98.6     Collection site RIGHT RADIAL     Drawn by [phone]     Sample type ARTERIAL DRAW     Allens test (pass/fail) PASS  PASS  GLUCOSE, CAPILLARY     Status: None   Collection Time    07/12/13  7:50 AM      Result Value Ref Range   Glucose-Capillary 80  70 - 99 mg/dL   Comment  1 Documented in Chart     Comment 2 Notify RN    SODIUM, URINE, RANDOM     Status: None   Collection Time    07/12/13  8:00 AM      Result Value Ref Range   Sodium, Ur 131    CREATININE, URINE, RANDOM     Status: None   Collection Time    07/12/13  8:00 AM      Result Value Ref Range   Creatinine, Urine 33.25    OSMOLALITY, URINE     Status: None   Collection Time    07/12/13  8:02 AM      Result Value Ref Range   Osmolality, Ur 395  390 - 1090 mOsm/kg   Comment: Performed at [facility]     Status: None   Collection Time    07/12/13  8:02 AM      Result Value Ref Range   Specimen Description URINE, CLEAN CATCH     Special Requests NONE     Culture  Setup Time       Value: 07/12/2013 16:44     Performed at [facility]       Value: NO GROWTH     Performed at [facility]   Culture       Value: NO GROWTH     Performed at [facility]   Report Status 07/12/2013 FINAL    URINALYSIS, ROUTINE W REFLEX MICROSCOPIC     Status: Abnormal   Collection Time    07/12/13  8:02 AM      Result Value Ref Range   Color, Urine YELLOW  YELLOW   APPearance CLEAR  CLEAR   Specific Gravity, Urine 1.015  1.005  - 1.030   pH 8.0  5.0 - 8.0   Glucose, UA NEGATIVE  NEGATIVE mg/dL   Hgb urine dipstick NEGATIVE  NEGATIVE   Bilirubin Urine NEGATIVE  NEGATIVE   Ketones, ur 15 (*) NEGATIVE mg/dL   Protein, ur NEGATIVE  NEGATIVE mg/dL   Urobilinogen, UA 0.2  0.0 - 1.0 mg/dL   Nitrite NEGATIVE  NEGATIVE   Leukocytes, UA NEGATIVE  NEGATIVE   Comment: MICROSCOPIC NOT DONE ON URINES WITH NEGATIVE PROTEIN, BLOOD, LEUKOCYTES, NITRITE, OR GLUCOSE <1000 mg/dL.  LEGIONELLA ANTIGEN, URINE     Status: None   Collection Time    07/12/13  8:02 AM      Result Value Ref Range   Specimen Description URINE, CLEAN CATCH     Special Requests NONE     Legionella Antigen, Urine       Value: Negative for Legionella pneumophilia serogroup 1     Performed at [facility]   Report Status 07/13/2013 FINAL    STREP PNEUMONIAE URINARY ANTIGEN     Status: None   Collection Time    07/12/13  8:02 AM      Result Value Ref Range   Strep Pneumo Urinary Antigen NEGATIVE  NEGATIVE   Comment:            Infection due to S. pneumoniae     cannot be absolutely ruled out     since the antigen present     may be below the detection limit     of the test.  PRO B NATRIURETIC PEPTIDE     Status: Abnormal   Collection Time    07/12/13  8:35 AM  Result Value Ref Range   Pro B Natriuretic peptide (BNP) 13744.0 (*) 0 - 125 pg/mL  URIC ACID     Status: None   Collection Time    07/12/13  8:35 AM      Result Value Ref Range   Uric Acid, Serum 5.6  4.0 - 7.8 mg/dL  TSH     Status: None   Collection Time    07/12/13  8:35 AM      Result Value Ref Range   TSH 1.290  0.350 - 4.500 uIU/mL   Comment: Please note change in reference range.  CORTISOL     Status: None   Collection Time    07/12/13  8:35 AM      Result Value Ref Range   Cortisol, Plasma 21.3     Comment: (NOTE)     AM:  4.3 - 22.4 ug/dL     PM:  3.1 - 16.7 ug/dL     Performed at [facility]     Status: Abnormal    Collection Time    07/12/13  8:35 AM      Result Value Ref Range   Sodium 124 (*) 137 - 147 mEq/L   Potassium 3.8  3.7 - 5.3 mEq/L   Chloride 85 (*) 96 - 112 mEq/L   CO2 23  19 - 32 mEq/L   Glucose, Bld 81  70 - 99 mg/dL   BUN 26 (*) 6 - 23 mg/dL   Creatinine, Ser 1.39 (*) 0.50 - 1.35 mg/dL   Calcium 9.0  8.4 - 10.5 mg/dL   GFR calc non Af Amer 48 (*) >90 mL/min   GFR calc Af Amer 56 (*) >90 mL/min   Comment: (NOTE)     The eGFR has been calculated using the CKD EPI equation.     This calculation has not been validated in all clinical situations.     eGFR's persistently <90 mL/min signify possible Chronic Kidney     Disease.  AMMONIA     Status: None   Collection Time    07/12/13  8:35 AM      Result Value Ref Range   Ammonia 20  11 - 60 umol/L  HEPATIC FUNCTION PANEL     Status: None   Collection Time    07/12/13  8:35 AM      Result Value Ref Range   Total Protein 6.7  6.0 - 8.3 g/dL   Albumin 3.6  3.5 - 5.2 g/dL   AST 31  0 - 37 U/L   ALT 15  0 - 53 U/L   Alkaline Phosphatase 86  39 - 117 U/L   Total Bilirubin 0.5  0.3 - 1.2 mg/dL   Bilirubin, Direct <0.2  0.0 - 0.3 mg/dL   Indirect Bilirubin NOT CALCULATED  0.3 - 0.9 mg/dL  TROPONIN I     Status: None   Collection Time    07/12/13  8:35 AM      Result Value Ref Range   Troponin I <0.30  <0.30 ng/mL   Comment:            Due to the release kinetics of cTnI,     a negative result within the first hours     of the onset of symptoms does not rule out     myocardial infarction with certainty.     If myocardial infarction is still suspected,     repeat the test at appropriate  intervals.  OSMOLALITY     Status: Abnormal   Collection Time    07/12/13  8:35 AM      Result Value Ref Range   Osmolality 260 (*) 275 - 300 mOsm/kg   Comment: Performed at [facility]: None   Collection Time    07/12/13  9:30 AM      Result Value Ref Range   Acetaminophen (Tylenol), Serum <15.0  10  - 30 ug/mL   Comment:            THERAPEUTIC CONCENTRATIONS VARY     SIGNIFICANTLY. A RANGE OF 10-30     ug/mL MAY BE AN EFFECTIVE     CONCENTRATION FOR MANY PATIENTS.     HOWEVER, SOME ARE BEST TREATED     AT CONCENTRATIONS OUTSIDE THIS     RANGE.     ACETAMINOPHEN CONCENTRATIONS     >150 ug/mL AT 4 HOURS AFTER     INGESTION AND >50 ug/mL AT 12     HOURS AFTER INGESTION ARE     OFTEN ASSOCIATED WITH TOXIC     REACTIONS.  DIGOXIN LEVEL     Status: Abnormal   Collection Time    07/12/13  9:30 AM      Result Value Ref Range   Digoxin Level <0.3 (*) 0.8 - 2.0 ng/mL  SALICYLATE LEVEL     Status: Abnormal   Collection Time    07/12/13  9:30 AM      Result Value Ref Range   Salicylate Lvl 2.5 (*) 2.8 - 20.0 mg/dL  GLUCOSE, CAPILLARY     Status: Abnormal   Collection Time    07/12/13 11:53 AM      Result Value Ref Range   Glucose-Capillary 101 (*) 70 - 99 mg/dL   Comment 1 Notify RN     Comment 2 Documented in Chart    BASIC METABOLIC PANEL     Status: Abnormal   Collection Time    07/12/13  2:15 PM      Result Value Ref Range   Sodium 122 (*) 137 - 147 mEq/L   Potassium 4.0  3.7 - 5.3 mEq/L   Chloride 85 (*) 96 - 112 mEq/L   CO2 22  19 - 32 mEq/L   Glucose, Bld 97  70 - 99 mg/dL   BUN 26 (*) 6 - 23 mg/dL   Creatinine, Ser 1.31  0.50 - 1.35 mg/dL   Calcium 8.9  8.4 - 10.5 mg/dL   GFR calc non Af Amer 52 (*) >90 mL/min   GFR calc Af Amer 60 (*) >90 mL/min   Comment: (NOTE)     The eGFR has been calculated using the CKD EPI equation.     This calculation has not been validated in all clinical situations.     eGFR's persistently <90 mL/min signify possible Chronic Kidney     Disease.  GLUCOSE, CAPILLARY     Status: Abnormal   Collection Time    07/12/13  4:49 PM      Result Value Ref Range   Glucose-Capillary 106 (*) 70 - 99 mg/dL   Comment 1 Notify RN     Comment 2 Documented in Chart    GLUCOSE, CAPILLARY     Status: Abnormal   Collection Time    07/12/13  9:52 PM       Result Value Ref Range   Glucose-Capillary 148 (*) 70 - 99 mg/dL  BASIC METABOLIC PANEL     Status: Abnormal   Collection Time    07/13/13  5:50 AM      Result Value Ref Range   Sodium 124 (*) 137 - 147 mEq/L   Potassium 3.8  3.7 - 5.3 mEq/L   Chloride 87 (*) 96 - 112 mEq/L   CO2 24  19 - 32 mEq/L   Glucose, Bld 83  70 - 99 mg/dL   BUN 26 (*) 6 - 23 mg/dL   Creatinine, Ser 1.46 (*) 0.50 - 1.35 mg/dL   Calcium 9.0  8.4 - 10.5 mg/dL   GFR calc non Af Amer 46 (*) >90 mL/min   GFR calc Af Amer 53 (*) >90 mL/min   Comment: (NOTE)     The eGFR has been calculated using the CKD EPI equation.     This calculation has not been validated in all clinical situations.     eGFR's persistently <90 mL/min signify possible Chronic Kidney     Disease.  CBC     Status: Abnormal   Collection Time    07/13/13  5:50 AM      Result Value Ref Range   WBC 8.9  4.0 - 10.5 K/uL   RBC 4.14 (*) 4.22 - 5.81 MIL/uL   Hemoglobin 11.5 (*) 13.0 - 17.0 g/dL   HCT 33.7 (*) 39.0 - 52.0 %   MCV 81.4  78.0 - 100.0 fL   MCH 27.8  26.0 - 34.0 pg   MCHC 34.1  30.0 - 36.0 g/dL   RDW 14.8  11.5 - 15.5 %   Platelets 198  150 - 400 K/uL   Comment: DELTA CHECK NOTED     REPEATED TO VERIFY  GLUCOSE, CAPILLARY     Status: Abnormal   Collection Time    07/13/13  6:37 AM      Result Value Ref Range   Glucose-Capillary 111 (*) 70 - 99 mg/dL    Dg Chest 1 View  07/12/2013   CLINICAL DATA:  Altered mental status.  EXAM: CHEST - 1 VIEW  COMPARISON:  Chest radiographs performed 02/19/2013  FINDINGS: The lungs are hypoexpanded. Mild patchy right-sided airspace opacity raises concern for pneumonia, though mild interstitial edema could have a similar appearance. There is no evidence of pleural effusion or pneumothorax.  The cardiomediastinal silhouette is mildly enlarged. No acute osseous abnormalities are seen.  IMPRESSION: Lungs hypoexpanded. Mild patchy right-sided airspace opacity raises concern for pneumonia, though  mild interstitial edema could have a similar appearance. Mild cardiomegaly.   Electronically Signed   By: Garald Balding M.D.   On: 07/12/2013 03:13   Ct Head Wo Contrast  07/12/2013   CLINICAL DATA:  Altered mental status.  EXAM: CT HEAD WITHOUT CONTRAST  TECHNIQUE: Contiguous axial images were obtained from the base of the skull through the vertex without intravenous contrast.  COMPARISON:  CT of the head performed 11/23/2012  FINDINGS: There is no evidence of acute infarction, mass lesion, or intra- or extra-axial hemorrhage on CT.  Prominence of the ventricles and sulci reflects mild to moderate cortical volume loss. Mild periventricular white matter change likely reflects small vessel ischemic microangiopathy.  The brainstem and fourth ventricle are within normal limits. The basal ganglia are unremarkable in appearance. The cerebral hemispheres demonstrate grossly normal gray-white differentiation. No mass effect or midline shift is seen.  There is no evidence of fracture; visualized osseous structures are unremarkable in appearance. The orbits are within normal limits. The paranasal sinuses and  mastoid air cells are well-aerated. No significant soft tissue abnormalities are seen.  IMPRESSION: 1. No acute intracranial pathology seen on CT. 2. Mild to moderate cortical volume loss and scattered small vessel ischemic microangiopathy.   Electronically Signed   By: Garald Balding M.D.   On: 07/12/2013 03:19    ROS Blood pressure 164/91, pulse 55, temperature 97.2 F (36.2 C), temperature source Oral, resp. rate 20, height $RemoveBe'5\' 10"'[id number]$  (1.778 m), weight 86.183 kg (190 lb), SpO2 98.00%. Physical Exam Physical Examination: General appearance - confused, poor historian Mental status - inappropriate safety awareness, difficulty with recent and remote memory,  Eyes - pupils equal and reactive, extraocular eye movements intact, funduscopic exam normal, discs flat and sharp Mouth - edentulous Neck - adenopathy  noted PCL Lymphatics - posterior cervical nodes Chest - rales noted bilat in bases, decreased air entry noted bilat Heart - S1 and S2 normal, systolic murmur 2/6 at 2nd left intercostal space Abdomen - pos bs, liver down 5 cm, soft Musculoskeletal - hypertrohic changes both knees esp R>L,   Extremities - pedal edema 1 +, intact peripheral pulses Skin - moles, seb kerratoses, telangectasia, actinic changes  Assessment/Plan: 1 Hyponatremia  Appears not osmotic,nonvolume stimulated ie Inappropriate ADH,  Most likely related to medications ie Narcotics, Zyprexa.  NSAIDs have been reported and rarely Spironolactone but doubt these are etiologic.  Needs stable regimen and treatment.  Clearly symptomatic from low S Na and will tx with fluid restriction and Lasix with low threshold for adding Tolvaptan. 2 Chronic pain needs stable regimen and will require monitoring of S Na 3 Hypertension: furosemide would be good addition for his HTN 4. DJD 5. Depression would avoid Zyprexa at this time 6 CAD  7 ^ lipids 8 DJD 9 GERD 10 ? pneu and ?if this is contrib to low S Na.  P fluid restrict, Lasix, follow clinically  Joyice Faster Maty Zeisler 07/13/2013, 1:18 PM

## 2013-07-13 NOTE — Progress Notes (Signed)
Patient Demographics  [name], is a 73 y.o. male, DOB - Jun 04, 1940, TFT:[id number]  Admit date - 07/12/2013   Admitting Physician Rise Patience, MD  Outpatient Primary MD for the patient is Tamsen Roers, MD  LOS - 1   Chief Complaint  Patient presents with  . Altered Mental Status        Subjective:   [name] today has, No headache, No chest pain, No abdominal pain - No Nausea, No new weakness tingling or numbness, No Cough - SOB. Chronic low back pain.    Assessment & Plan    1. Agitation due to metabolic encephalopathy most certainly due to abrupt stoppage of high-dose morphine. Better  on a lower dose scheduled long-acting morphine along with Lortab for breakthrough chronic back pain. Head CT unremarkable. He is afebrile with no headache or neck stiffness. MRI upon admission has been ordered and pending we'll monitor. He is on aspirin for now. However much better and close to baseline on just supportive care.    2. Hyponatremia -  Appears SIADH, Na still at admit levels despite fluid restriction and IV Lasix, called Renal.    3. Mild ARF due to  #2 above. Avoid nephrotoxins , renal evaluate    4. Chronic low back pain. Plan as in #1 above.    5. Hypertension. We'll continue him on Coreg.    6. Suppression pneumonia. Being seen by speech, diet has been adjusted currently on dysphagia 3 diet with aspiration precautions, on Levaquin Flagyl, clinically better.     Code Status: Full  Family Communication: Wife bedside  Disposition Plan:  home   Procedures CT head, chest x-ray, MRI brain pending   Consults   Renal   Medications  Scheduled Meds: . aspirin  81 mg Oral Daily  . heparin subcutaneous  5,000 Units Subcutaneous 3 times per day  .  levofloxacin (LEVAQUIN) IV  750 mg Intravenous Q24H  . metronidazole  500 mg Intravenous Q8H  . morphine  15 mg Oral Q12H  . pantoprazole  40 mg Oral Daily  . sodium chloride  3 mL Intravenous Q12H   Continuous Infusions:   PRN Meds:.fentaNYL, HYDROcodone-acetaminophen, nitroGLYCERIN  DVT Prophylaxis    Heparin ordered  Lab Results  Component Value Date   PLT 198 07/13/2013    Antibiotics    Anti-infectives   Start     Dose/Rate Route Frequency Ordered Stop   07/12/13 0800  metroNIDAZOLE (FLAGYL) IVPB 500 mg     500 mg 100 mL/hr over 60 Minutes Intravenous Every 8 hours 07/12/13 0716     07/12/13 0800  levofloxacin (LEVAQUIN) IVPB 750 mg     750 mg 100 mL/hr over 90 Minutes Intravenous Every 24 hours 07/12/13 0725            Objective:   Filed Vitals:   07/12/13 1832 07/12/13 2140 07/13/13 0136 07/13/13 0527  BP: 122/73 141/67 155/80 145/84  Pulse: 57 58 55 50  Temp: 97.7 F (36.5 C) 98.7 F (37.1 C) 97.8 F (36.6 C) 97.7 F (36.5 C)  TempSrc: Oral Oral Oral Oral  Resp: 20 20 20 18   Height:      Weight:      SpO2: 98% 100% 100% 98%  Wt Readings from Last 3 Encounters:  07/12/13 86.183 kg (190 lb)  06/30/13 87.091 kg (192 lb)  06/29/13 86.183 kg (190 lb)     Intake/Output Summary (Last 24 hours) at 07/13/13 0949 Last data filed at 07/13/13 0658  Gross per 24 hour  Intake    240 ml  Output   2150 ml  Net  -1910 ml     Physical Exam  Awake,alert, No new F.N deficits, Normal affect [name].AT,PERRAL Supple Neck,No JVD, No cervical lymphadenopathy appriciated.  Symmetrical Chest wall movement, Good air movement bilaterally, CTAB RRR,No Gallops,Rubs or new Murmurs, No Parasternal Heave +ve B.Sounds, Abd Soft, No tenderness, No organomegaly appriciated, No rebound - guarding or rigidity. No Cyanosis, Clubbing or edema, No new Rash or bruise      Data Review   Micro Results Recent Results (from the past 240 hour(s))  URINE CULTURE     Status: None    Collection Time    07/12/13  8:02 AM      Result Value Ref Range Status   Specimen Description URINE, CLEAN CATCH   Final   Special Requests NONE   Final   Culture  Setup Time     Final   Value: 07/12/2013 16:44     Performed at [facility]     Final   Value: NO GROWTH     Performed at [facility]   Culture     Final   Value: NO GROWTH     Performed at [facility]   Report Status 07/12/2013 FINAL   Final    Radiology Reports Dg Chest 1 View  07/12/2013   CLINICAL DATA:  Altered mental status.  EXAM: CHEST - 1 VIEW  COMPARISON:  Chest radiographs performed 02/19/2013  FINDINGS: The lungs are hypoexpanded. Mild patchy right-sided airspace opacity raises concern for pneumonia, though mild interstitial edema could have a similar appearance. There is no evidence of pleural effusion or pneumothorax.  The cardiomediastinal silhouette is mildly enlarged. No acute osseous abnormalities are seen.  IMPRESSION: Lungs hypoexpanded. Mild patchy right-sided airspace opacity raises concern for pneumonia, though mild interstitial edema could have a similar appearance. Mild cardiomegaly.   Electronically Signed   By: Garald Balding M.D.   On: 07/12/2013 03:13   Ct Head Wo Contrast  07/12/2013   CLINICAL DATA:  Altered mental status.  EXAM: CT HEAD WITHOUT CONTRAST  TECHNIQUE: Contiguous axial images were obtained from the base of the skull through the vertex without intravenous contrast.  COMPARISON:  CT of the head performed 11/23/2012  FINDINGS: There is no evidence of acute infarction, mass lesion, or intra- or extra-axial hemorrhage on CT.  Prominence of the ventricles and sulci reflects mild to moderate cortical volume loss. Mild periventricular white matter change likely reflects small vessel ischemic microangiopathy.  The brainstem and fourth ventricle are within normal limits. The basal ganglia are unremarkable in appearance. The cerebral hemispheres  demonstrate grossly normal gray-white differentiation. No mass effect or midline shift is seen.  There is no evidence of fracture; visualized osseous structures are unremarkable in appearance. The orbits are within normal limits. The paranasal sinuses and mastoid air cells are well-aerated. No significant soft tissue abnormalities are seen.  IMPRESSION: 1. No acute intracranial pathology seen on CT. 2. Mild to moderate cortical volume loss and scattered small vessel ischemic microangiopathy.   Electronically Signed   By: Garald Balding M.D.   On: 07/12/2013 03:19    CBC  Recent Labs Lab 07/07/13 2138 07/12/13 0127 07/13/13 0550  WBC 9.1 17.1* 8.9  HGB 13.6 13.4 11.5*  HCT 40.1 37.4* 33.7*  PLT 231 264 198  MCV 82.7 79.6 81.4  MCH 28.0 28.5 27.8  MCHC 33.9 35.8 34.1  RDW 14.6 14.3 14.8  LYMPHSABS 2.3 2.2  --   MONOABS 0.5 0.8  --   EOSABS 0.3 0.1  --   BASOSABS 0.0 0.0  --     Chemistries   Recent Labs Lab 07/07/13 2138 07/12/13 0127 07/12/13 0835 07/12/13 1415 07/13/13 0550  NA 131* 122* 124* 122* 124*  K 5.0 4.1 3.8 4.0 3.8  CL 91* 82* 85* 85* 87*  CO2 26 21 23 22 24   GLUCOSE 111* 96 81 97 83  BUN 28* 28* 26* 26* 26*  CREATININE 1.28 1.32 1.39* 1.31 1.46*  CALCIUM 9.8 9.2 9.0 8.9 9.0  AST  --  27 31  --   --   ALT  --  16 15  --   --   ALKPHOS  --  102 86  --   --   BILITOT  --  0.4 0.5  --   --    ------------------------------------------------------------------------------------------------------------------ estimated creatinine clearance is 45.8 ml/min (by C-G formula based on Cr of 1.46). ------------------------------------------------------------------------------------------------------------------ No results found for this basename: HGBA1C,  in the last 72 hours ------------------------------------------------------------------------------------------------------------------ No results found for this basename: CHOL, HDL, LDLCALC, TRIG, CHOLHDL, LDLDIRECT,   in the last 72 hours ------------------------------------------------------------------------------------------------------------------  Recent Labs  07/12/13 0835  TSH 1.290   ------------------------------------------------------------------------------------------------------------------ No results found for this basename: VITAMINB12, FOLATE, FERRITIN, TIBC, IRON, RETICCTPCT,  in the last 72 hours  Coagulation profile No results found for this basename: INR, PROTIME,  in the last 168 hours  No results found for this basename: DDIMER,  in the last 72 hours  Cardiac Enzymes  Recent Labs Lab 07/12/13 0128 07/12/13 0835  TROPONINI <0.30 <0.30   ------------------------------------------------------------------------------------------------------------------ No components found with this basename: POCBNP,      Time Spent in minutes   35   Thurnell Lose M.D on 07/13/2013 at 9:49 AM  Between 7am to 7pm - Pager - [phone]  After 7pm go to [email or web address] - password [email or web address]  And look for the night coverage person covering for me after hours  [facility] Office  [phone]   **Disclaimer: This note may have been dictated with voice recognition software. Similar sounding words can inadvertently be transcribed and this note may contain transcription errors which may not have been corrected upon publication of note.**

## 2013-07-14 ENCOUNTER — Inpatient Hospital Stay (HOSPITAL_COMMUNITY): Payer: Medicare Other

## 2013-07-14 DIAGNOSIS — I10 Essential (primary) hypertension: Secondary | ICD-10-CM

## 2013-07-14 DIAGNOSIS — J189 Pneumonia, unspecified organism: Secondary | ICD-10-CM

## 2013-07-14 LAB — CBC
HEMATOCRIT: 39.3 % (ref 39.0–52.0)
Hemoglobin: 13.5 g/dL (ref 13.0–17.0)
MCH: 27.9 pg (ref 26.0–34.0)
MCHC: 34.4 g/dL (ref 30.0–36.0)
MCV: 81.2 fL (ref 78.0–100.0)
Platelets: 219 10*3/uL (ref 150–400)
RBC: 4.84 MIL/uL (ref 4.22–5.81)
RDW: 14.8 % (ref 11.5–15.5)
WBC: 9 10*3/uL (ref 4.0–10.5)

## 2013-07-14 LAB — RENAL FUNCTION PANEL
Albumin: 3.8 g/dL (ref 3.5–5.2)
BUN: 28 mg/dL — ABNORMAL HIGH (ref 6–23)
CALCIUM: 9.8 mg/dL (ref 8.4–10.5)
CO2: 19 meq/L (ref 19–32)
Chloride: 92 mEq/L — ABNORMAL LOW (ref 96–112)
Creatinine, Ser: 1.55 mg/dL — ABNORMAL HIGH (ref 0.50–1.35)
GFR, EST AFRICAN AMERICAN: 49 mL/min — AB (ref >90)
GFR, EST NON AFRICAN AMERICAN: 42 mL/min — AB (ref >90)
GLUCOSE: 98 mg/dL (ref 70–99)
POTASSIUM: 4.2 meq/L (ref 3.7–5.3)
Phosphorus: 3.6 mg/dL (ref 2.3–4.6)
SODIUM: 128 meq/L — AB (ref 137–147)

## 2013-07-14 LAB — GLUCOSE, CAPILLARY
Glucose-Capillary: 111 mg/dL — ABNORMAL HIGH (ref 70–99)
Glucose-Capillary: 135 mg/dL — ABNORMAL HIGH (ref 70–99)
Glucose-Capillary: 134 mg/dL — ABNORMAL HIGH (ref 70–99)

## 2013-07-14 MED ORDER — FUROSEMIDE 40 MG PO TABS
40.0000 mg | ORAL_TABLET | Freq: Every day | ORAL | Status: DC
  Administered 2013-07-15 – 2013-07-16 (×2): 40 mg via ORAL
  Filled 2013-07-14 (×2): qty 1

## 2013-07-14 MED ORDER — HYDROCODONE-ACETAMINOPHEN 5-325 MG PO TABS
2.0000 | ORAL_TABLET | Freq: Four times a day (QID) | ORAL | Status: DC | PRN
  Administered 2013-07-15 – 2013-07-16 (×6): 2 via ORAL
  Filled 2013-07-14 (×6): qty 2

## 2013-07-14 MED ORDER — ACETAMINOPHEN 325 MG PO TABS
650.0000 mg | ORAL_TABLET | Freq: Four times a day (QID) | ORAL | Status: DC | PRN
  Administered 2013-07-15 – 2013-07-16 (×2): 650 mg via ORAL
  Filled 2013-07-14 (×2): qty 2

## 2013-07-14 NOTE — Progress Notes (Signed)
Subjective: Interval History: has complaints wants to go home, does not feel bad.  Objective: Vital signs in last 24 hours: Temp:  [97.4 F (36.3 C)-98 F (36.7 C)] 98 F (36.7 C) (05/27 0510) Pulse Rate:  [60-82] 62 (05/27 0510) Resp:  [18-20] 18 (05/27 0510) BP: (118-147)/(70-87) 118/70 mmHg (05/27 0510) SpO2:  [98 %-99 %] 98 % (05/27 0510) Weight change:   Intake/Output from previous day: 05/26 0701 - 05/27 0700 In: 720 [P.O.:720] Out: 3455 [Urine:3455] Intake/Output this shift:    General appearance: alert, cooperative and slowed mentation Resp: diminished breath sounds bilaterally Cardio: regular rate and rhythm, S4 present and systolic murmur: holosystolic 2/6, blowing at apex GI: soft, non-tender; bowel sounds normal; no masses,  no organomegaly Extremities: fused knee on R, TKR on L  Lab Results:  Recent Labs  07/13/13 0550 07/14/13 0456  WBC 8.9 9.0  HGB 11.5* 13.5  HCT 33.7* 39.3  PLT 198 219   BMET:  Recent Labs  07/13/13 0550 07/14/13 0456  NA 124* 128*  K 3.8 4.2  CL 87* 92*  CO2 24 19  GLUCOSE 83 98  BUN 26* 28*  CREATININE 1.46* 1.55*  CALCIUM 9.0 9.8   No results found for this basename: PTH,  in the last 72 hours Iron Studies: No results found for this basename: IRON, TIBC, TRANSFERRIN, FERRITIN,  in the last 72 hours  Studies/Results: US Renal  07/13/2013   CLINICAL DATA:  Chronic kidney disease.  EXAM: RENAL/URINARY TRACT ULTRASOUND COMPLETE  COMPARISON:  CT scan of February 10, 2012.  FINDINGS: Right Kidney:  Length: 10.3 cm. 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 Echogenicity within normal limits. No mass or hydronephrosis visualized.  Left Kidney:  Length: 10.4 cm. Echogenicity within normal limits. No mass or hydronephrosis visualized.  Bladder:  Appears normal for degree of bladder distention.  IMPRESSION: No hydronephrosis or renal obstruction is noted. Congenital mild rotation of right kidney is  noted.   Electronically Signed   By: Sabino Dick M.D.   On: 07/13/2013 14:33    I have reviewed the patient's current medications.  Assessment/Plan: 1 Hyponatremia improving with fluid restrict and diruesis or free water.  Appears drug induced.  Still fuzzy mentally 2 CKD 3 stable 3 HTN controlle 4 CAD 5 DJD 6 Pain syndrome P keep of Zyprexa,NSAIDS, cont lasix at lower dose, fluid restrict.     LOS: 2 days   Jeneen Rinks L Ikeem Cleckler 07/14/2013,10:17 AM

## 2013-07-14 NOTE — Progress Notes (Addendum)
[facility] PROGRESS NOTE  Seth DELDUCA QPY:[id number] DOB: [birth year] DOA: 07/12/2013 PCP: Tamsen Roers, MD  Assessment/Plan: 73 y.o. male with PMH of CAD, HTN, chronic back pain on long-term morphine 30 mg twice daily was recently changed to fentanyl patch 2 days ago following which patient's mental status changed, pneumonia, hypo Na  1. Acute encephalopathy likely metabolic worse in the setting of infection; adjusting opioids   -neuro exam no focal; CT: no acute findings; pend /MRI   2. Hyponatremia ?  SIADH vs diuretic induced  -Na improving; check echo r/o CHF related hypo Na  3. Chronic low back pain. cont opioids; monitor  4. Hypertension. We'll continue him on Coreg.  5. Pneumonia ? aspiration. Being seen by speech, diet has been adjusted currently on dysphagia 3 diet with aspiration precautions -cont Levaquin Flagyl, clinically better. -per family Pt is having several episodes of pneumonia; check CT chest  6. CAD denies chest pain;  -A fib, per patient, family: he is not on [employer], just ASA only; ? Underlying CHF: check echo     Code Status: full Family Communication: d/w patient, called updated Jorgensen,[name] Spouse [phone]   (indicate person spoken with, relationship, and if by phone, the number) Disposition Plan: pend clinical improvement    Consultants:  none  Procedures:  none  Antibiotics:  levolfoxacin (indicate start date, and stop date if known)  HPI/Subjective: alert  Objective: Filed Vitals:   07/14/13 1024  BP: 154/86  Pulse: 74  Temp: 98.2 F (36.8 C)  Resp: 18    Intake/Output Summary (Last 24 hours) at 07/14/13 1451 Last data filed at 07/14/13 0600  Gross per 24 hour  Intake    720 ml  Output   2480 ml  Net  -1760 ml   Filed Weights   07/12/13 0532  Weight: 86.183 kg (190 lb)    Exam:   General:  alert  Cardiovascular: s1,s2 rrr  Respiratory: few crackles in LL  Abdomen: soft, nt,nd    Musculoskeletal: no LE edeam   Data Reviewed: Basic Metabolic Panel:  Recent Labs Lab 07/12/13 0127 07/12/13 0835 07/12/13 1415 07/13/13 0550 07/14/13 0456  NA 122* 124* 122* 124* 128*  K 4.1 3.8 4.0 3.8 4.2  CL 82* 85* 85* 87* 92*  CO2 21 23 22 24 19   GLUCOSE 96 81 97 83 98  BUN 28* 26* 26* 26* 28*  CREATININE 1.32 1.39* 1.31 1.46* 1.55*  CALCIUM 9.2 9.0 8.9 9.0 9.8  PHOS  --   --   --   --  3.6   Liver Function Tests:  Recent Labs Lab 07/12/13 0127 07/12/13 0835 07/14/13 0456  AST 27 31  --   ALT 16 15  --   ALKPHOS 102 86  --   BILITOT 0.4 0.5  --   PROT 7.7 6.7  --   ALBUMIN 4.2 3.6 3.8   No results found for this basename: LIPASE, AMYLASE,  in the last 168 hours  Recent Labs Lab 07/12/13 0835  AMMONIA 20   CBC:  Recent Labs Lab 07/07/13 2138 07/12/13 0127 07/13/13 0550 07/14/13 0456  WBC 9.1 17.1* 8.9 9.0  NEUTROABS 6.0 13.9*  --   --   HGB 13.6 13.4 11.5* 13.5  HCT 40.1 37.4* 33.7* 39.3  MCV 82.7 79.6 81.4 81.2  PLT 231 264 198 219   Cardiac Enzymes:  Recent Labs Lab 07/12/13 0128 07/12/13 0835  TROPONINI <0.30 <0.30   BNP (last 3 results)  Recent Labs  12/01/12 1900 07/12/13 0835  PROBNP 1124.0* 13744.0*   CBG:  Recent Labs Lab 07/13/13 1330 07/13/13 1647 07/13/13 2140 07/14/13 0636 07/14/13 1204  GLUCAP 122* 112* 123* 111* 135*    Recent Results (from the past 240 hour(s))  URINE CULTURE     Status: None   Collection Time    07/12/13  8:02 AM      Result Value Ref Range Status   Specimen Description URINE, CLEAN CATCH   Final   Special Requests NONE   Final   Culture  Setup Time     Final   Value: 07/12/2013 16:44     Performed at [facility]     Final   Value: NO GROWTH     Performed at [facility]   Culture     Final   Value: NO GROWTH     Performed at [facility]   Report Status 07/12/2013 FINAL   Final     Studies: US Renal  07/13/2013   CLINICAL DATA:   Chronic kidney disease.  EXAM: RENAL/URINARY TRACT ULTRASOUND COMPLETE  COMPARISON:  CT scan of February 10, 2012.  FINDINGS: Right Kidney:  Length: 10.3 cm. 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 Echogenicity within normal limits. No mass or hydronephrosis visualized.  Left Kidney:  Length: 10.4 cm. Echogenicity within normal limits. No mass or hydronephrosis visualized.  Bladder:  Appears normal for degree of bladder distention.  IMPRESSION: No hydronephrosis or renal obstruction is noted. Congenital mild rotation of right kidney is noted.   Electronically Signed   By: Sabino Dick M.D.   On: 07/13/2013 14:33    Scheduled Meds: . aspirin  81 mg Oral Daily  . [START ON 07/15/2013] furosemide  40 mg Oral Daily  . heparin subcutaneous  5,000 Units Subcutaneous 3 times per day  . [START ON 07/15/2013] levofloxacin (LEVAQUIN) IV  750 mg Intravenous Q48H  . metronidazole  500 mg Intravenous Q8H  . morphine  15 mg Oral Q12H  . pantoprazole  40 mg Oral Daily  . sodium chloride  3 mL Intravenous Q12H   Continuous Infusions:   Principal Problem:   Acute encephalopathy Active Problems:   HYPERLIPIDEMIA   HYPERTENSION   Hyponatremia   Atrial fibrillation   Chronic pain    Time spent: > 35 minutes     Kinnie Feil  [facility] Pager [phone]. If 7PM-7AM, please contact night-coverage at [email or web address], password [facility] 07/14/2013, 2:51 PM  LOS: 2 days

## 2013-07-14 NOTE — Progress Notes (Signed)
Speech Language Pathology Treatment: Dysphagia  Patient Details Name: [name] MRN: [id number] DOB: 01-19-1941 Today's Date: 07/14/2013 Time: 5974-7185 SLP Time Calculation (min): 10 min  Assessment / Plan / Recommendation Clinical Impression  Pt presents with resolution of transient dysphagia/dysarthria noted on initial evaluation- ? Due to acute encephalopathy.  SLP educated pt to aspiration precautions, importance of oral care for pulmonary health and need to modify diet based on his performance *eg when dysarthric consume only softer foods. Since pt is prone to getting pna, following aspiration precautions will be a necessity.   Intake listed as 100% and pt reports swallow ability to be at baseline at this time.  No further SlP warranted at this time as all education completed.     HPI HPI: 73 yo male adm to [facility] with mental status changes- pt has chronic back pain and takes fentanyl.  Also has h/o Barrett's esophagus s/p dilatation and takes a PPI.  CT head negative.  ? pna per CXR - pt reports he is prone to getting pna.  Today pt seen to assess po tolerance and readiness for dietary advancement.     Pertinent Vitals Afebrile, decreased  SLP Plan    no further slp, pt met all goals   Recommendations Diet recommendations: Regular;Thin liquid Medication Administration: Whole meds with liquid Supervision: Patient able to self feed Compensations: Slow rate;Small sips/bites;Check for pocketing Postural Changes and/or Swallow Maneuvers: Seated upright 90 degrees;Upright 30-60 min after meal                   [place], [place] SLP [phone]

## 2013-07-14 NOTE — Progress Notes (Signed)
Pt refusing MRI secondary to "clostaphobia". Had Ativan prior to MRI yesterday and still refused. Says he needs to be "completely knocked out". This is the 3rd time he has refused MRI in the last 3 days. Clance Boll, NP

## 2013-07-15 LAB — RENAL FUNCTION PANEL
Albumin: 3.6 g/dL (ref 3.5–5.2)
BUN: 33 mg/dL — ABNORMAL HIGH (ref 6–23)
CHLORIDE: 96 meq/L (ref 96–112)
CO2: 20 meq/L (ref 19–32)
Calcium: 9.8 mg/dL (ref 8.4–10.5)
Creatinine, Ser: 1.59 mg/dL — ABNORMAL HIGH (ref 0.50–1.35)
GFR calc non Af Amer: 41 mL/min — ABNORMAL LOW (ref >90)
GFR, EST AFRICAN AMERICAN: 48 mL/min — AB (ref >90)
GLUCOSE: 95 mg/dL (ref 70–99)
POTASSIUM: 4.7 meq/L (ref 3.7–5.3)
Phosphorus: 3.6 mg/dL (ref 2.3–4.6)
SODIUM: 132 meq/L — AB (ref 137–147)

## 2013-07-15 LAB — GLUCOSE, CAPILLARY
GLUCOSE-CAPILLARY: 115 mg/dL — AB (ref 70–99)
Glucose-Capillary: 136 mg/dL — ABNORMAL HIGH (ref 70–99)
Glucose-Capillary: 119 mg/dL — ABNORMAL HIGH (ref 70–99)

## 2013-07-15 LAB — CBC
HEMATOCRIT: 41.3 % (ref 39.0–52.0)
HEMOGLOBIN: 13.8 g/dL (ref 13.0–17.0)
MCH: 27.7 pg (ref 26.0–34.0)
MCHC: 33.4 g/dL (ref 30.0–36.0)
MCV: 82.9 fL (ref 78.0–100.0)
Platelets: 244 10*3/uL (ref 150–400)
RBC: 4.98 MIL/uL (ref 4.22–5.81)
RDW: 15.2 % (ref 11.5–15.5)
WBC: 11.1 10*3/uL — AB (ref 4.0–10.5)

## 2013-07-15 NOTE — Progress Notes (Signed)
Subjective: Interval History: has complaints wants to go home.  Objective: Vital signs in last 24 hours: Temp:  [97.4 F (36.3 C)-98.5 F (36.9 C)] 98.5 F (36.9 C) (05/28 1045) Pulse Rate:  [55-66] 55 (05/28 1045) Resp:  [18] 18 (05/28 1045) BP: (118-138)/(69-89) 118/82 mmHg (05/28 1045) SpO2:  [98 %-99 %] 98 % (05/28 1045) Weight change:   Intake/Output from previous day: 05/27 0701 - 05/28 0700 In: 547 [P.O.:547] Out: 1275 [Urine:1275] Intake/Output this shift: Total I/O In: -  Out: 400 [Urine:400]  General appearance: cooperative and Ox2 still confused Resp: diminished breath sounds bilaterally Cardio: S1, S2 normal and systolic murmur: holosystolic 2/6, blowing at apex GI: pos bs, liver down 5 cm Extremities: TKR L, spacer R both enlarged.  Lab Results:  Recent Labs  07/14/13 0456 07/15/13 0527  WBC 9.0 11.1*  HGB 13.5 13.8  HCT 39.3 41.3  PLT 219 244   BMET:  Recent Labs  07/14/13 0456 07/15/13 0527  NA 128* 132*  K 4.2 4.7  CL 92* 96  CO2 19 20  GLUCOSE 98 95  BUN 28* 33*  CREATININE 1.55* 1.59*  CALCIUM 9.8 9.8   No results found for this basename: PTH,  in the last 72 hours Iron Studies: No results found for this basename: IRON, TIBC, TRANSFERRIN, FERRITIN,  in the last 72 hours  Studies/Results: Ct Chest Wo Contrast  07/15/2013   CLINICAL DATA:  Recurrent pneumonia.  EXAM: CT CHEST WITHOUT CONTRAST  TECHNIQUE: Multidetector CT imaging of the chest was performed following the standard protocol without IV contrast.  COMPARISON:  Chest radiograph performed 07/12/2013, CTA of the chest performed 03/20/2012, and CT of the abdomen and pelvis performed 02/10/2012  FINDINGS: There is no evidence of focal airspace consolidation, pleural effusion or pneumothorax on this study. Minimal bibasilar atelectasis is noted, with mild bilateral lower lobe bronchiectasis seen. Apparent perihilar opacities on the chest radiograph may simply have reflected significant  vascular crowding due to lung hypoexpansion. No masses are identified.  Diffuse coronary artery calcifications are seen. The mediastinum is otherwise unremarkable in appearance. No mediastinal lymphadenopathy is identified. Scattered calcific atherosclerotic disease is noted along the aortic arch and proximal great vessels. The great vessels are otherwise grossly unremarkable. The thyroid gland is unremarkable in appearance. No axillary lymphadenopathy is seen.  The spleen and visualized portions of the liver are grossly unremarkable in appearance. The visualized portions of the gallbladder, pancreas, adrenal glands and left kidney are grossly unremarkable, aside from nonspecific left-sided perinephric stranding. The visualized portions of the stomach are within normal limits.  No acute osseous abnormalities are seen. There is diffuse sclerosis involving the vertebral body L1, with associated endplate irregularity, mild loss of height, and vacuum phenomenon. This appears mildly worsened from 2013.  IMPRESSION: 1. No evidence of pneumonia. Perihilar opacities on the chest radiograph may simply have reflected significant vascular crowding due to lung hypoexpansion. 2. Minimal bibasilar atelectasis noted, with mild bilateral lower lobe bronchiectasis. 3. Diffuse coronary artery calcifications seen. 4. Diffuse sclerotic change involving vertebral body L1, with associated endplate irregularity, mild loss of height and vacuum phenomenon. This appears mildly worsened from 2013, but is likely chronic in nature.   Electronically Signed   By: Garald Balding M.D.   On: 07/15/2013 02:58   US Renal  07/13/2013   CLINICAL DATA:  Chronic kidney disease.  EXAM: RENAL/URINARY TRACT ULTRASOUND COMPLETE  COMPARISON:  CT scan of February 10, 2012.  FINDINGS: Right Kidney:  Length: 10.3 cm. Right  kidney is malrotated as described on prior CT scan which is congenital. Echogenicity within normal limits. No mass or hydronephrosis  visualized.  Left Kidney:  Length: 10.4 cm. Echogenicity within normal limits. No mass or hydronephrosis visualized.  Bladder:  Appears normal for degree of bladder distention.  IMPRESSION: No hydronephrosis or renal obstruction is noted. Congenital mild rotation of right kidney is noted.   Electronically Signed   By: Sabino Dick M.D.   On: 07/13/2013 14:33    I have reviewed the patient's current medications.  Assessment/Plan: 1 Hyponatremia improving cont fluid restrict/Lasix.  SIADH drug induced narcotic + Zyprexa 2 Confusion ? Early dementia 3 Chronic pain P cont lasix, will s/o at this time    LOS: 3 days   [name] L Jestina Stephani 07/15/2013,11:13 AM

## 2013-07-15 NOTE — Progress Notes (Signed)
[facility] PROGRESS NOTE  [name] AOZ:[id number] DOB: August 31, 1940 DOA: 07/12/2013 PCP: Tamsen Roers, MD  Assessment/Plan: 73 y.o. male with PMH of CAD, HTN, chronic back pain on long-term morphine 30 mg twice daily was recently changed to fentanyl patch 2 days ago following which patient's mental status changed, pneumonia, hypo Na  1. Acute encephalopathy likely metabolic worse in the setting of infection; adjusting opioids   -symptoms resolved; neuro exam no focal; CT: no acute findings; Pt refused MRI;    2. Hyponatremia ?  SIADH vs diuretic induced  -Na improving; check echo r/o CHF related hypo Na  3. Chronic low back pain. cont opioids; defer further management to pain management as OP;   4. Hypertension. We'll continue him on Coreg.  5. Pneumonia ? aspiration. Being seen by speech, diet has been adjusted currently on dysphagia 3 diet with aspiration precautions -cont Levaquin Flagyl, clinically better. -per family Pt is having several episodes of pneumonia; check CT chest  6. CAD denies chest pain;  -A fib, per patient, family: he is not on [facility], just ASA only; ? Underlying CHF: check echo/pend   -d/w patient, his daughter; recommended to f/u with cardiology in 1-2 weeks; they agreed to f/u    Code Status: full Family Communication: d/w patient, called updated [name],[name] Spouse [phone]   (indicate person spoken with, relationship, and if by phone, the number) Disposition Plan: pend clinical improvement    Consultants:  none  Procedures:  none  Antibiotics:  levolfoxacin (indicate start date, and stop date if known)  HPI/Subjective: alert  Objective: Filed Vitals:   07/15/13 1045  BP: 118/82  Pulse: 55  Temp: 98.5 F (36.9 C)  Resp: 18    Intake/Output Summary (Last 24 hours) at 07/15/13 1339 Last data filed at 07/15/13 1140  Gross per 24 hour  Intake    347 ml  Output   1175 ml  Net   -828 ml   Filed Weights    07/12/13 0532  Weight: 86.183 kg (190 lb)    Exam:   General:  alert  Cardiovascular: s1,s2 rrr  Respiratory: few crackles in LL  Abdomen: soft, nt,nd   Musculoskeletal: no LE edeam   Data Reviewed: Basic Metabolic Panel:  Recent Labs Lab 07/12/13 0835 07/12/13 1415 07/13/13 0550 07/14/13 0456 07/15/13 0527  NA 124* 122* 124* 128* 132*  K 3.8 4.0 3.8 4.2 4.7  CL 85* 85* 87* 92* 96  CO2 23 22 24 19 20   GLUCOSE 81 97 83 98 95  BUN 26* 26* 26* 28* 33*  CREATININE 1.39* 1.31 1.46* 1.55* 1.59*  CALCIUM 9.0 8.9 9.0 9.8 9.8  PHOS  --   --   --  3.6 3.6   Liver Function Tests:  Recent Labs Lab 07/12/13 0127 07/12/13 0835 07/14/13 0456 07/15/13 0527  AST 27 31  --   --   ALT 16 15  --   --   ALKPHOS 102 86  --   --   BILITOT 0.4 0.5  --   --   PROT 7.7 6.7  --   --   ALBUMIN 4.2 3.6 3.8 3.6   No results found for this basename: LIPASE, AMYLASE,  in the last 168 hours  Recent Labs Lab 07/12/13 0835  AMMONIA 20   CBC:  Recent Labs Lab 07/12/13 0127 07/13/13 0550 07/14/13 0456 07/15/13 0527  WBC 17.1* 8.9 9.0 11.1*  NEUTROABS 13.9*  --   --   --  HGB 13.4 11.5* 13.5 13.8  HCT 37.4* 33.7* 39.3 41.3  MCV 79.6 81.4 81.2 82.9  PLT 264 198 219 244   Cardiac Enzymes:  Recent Labs Lab 07/12/13 0128 07/12/13 0835  TROPONINI <0.30 <0.30   BNP (last 3 results)  Recent Labs  12/01/12 1900 07/12/13 0835  PROBNP 1124.0* 13744.0*   CBG:  Recent Labs Lab 07/13/13 2140 07/14/13 0636 07/14/13 1204 07/14/13 1645 07/15/13 1136  GLUCAP 123* 111* 135* 134* 115*    Recent Results (from the past 240 hour(s))  URINE CULTURE     Status: None   Collection Time    07/12/13  8:02 AM      Result Value Ref Range Status   Specimen Description URINE, CLEAN CATCH   Final   Special Requests NONE   Final   Culture  Setup Time     Final   Value: 07/12/2013 16:44     Performed at [place]     Final   Value: NO GROWTH      Performed at [facility]   Culture     Final   Value: NO GROWTH     Performed at [facility]   Report Status 07/12/2013 FINAL   Final     Studies: Ct Chest Wo Contrast  07/15/2013   CLINICAL DATA:  Recurrent pneumonia.  EXAM: CT CHEST WITHOUT CONTRAST  TECHNIQUE: Multidetector CT imaging of the chest was performed following the standard protocol without IV contrast.  COMPARISON:  Chest radiograph performed 07/12/2013, CTA of the chest performed 03/20/2012, and CT of the abdomen and pelvis performed 02/10/2012  FINDINGS: There is no evidence of focal airspace consolidation, pleural effusion or pneumothorax on this study. Minimal bibasilar atelectasis is noted, with mild bilateral lower lobe bronchiectasis seen. Apparent perihilar opacities on the chest radiograph may simply have reflected significant vascular crowding due to lung hypoexpansion. No masses are identified.  Diffuse coronary artery calcifications are seen. The mediastinum is otherwise unremarkable in appearance. No mediastinal lymphadenopathy is identified. Scattered calcific atherosclerotic disease is noted along the aortic arch and proximal great vessels. The great vessels are otherwise grossly unremarkable. The thyroid gland is unremarkable in appearance. No axillary lymphadenopathy is seen.  The spleen and visualized portions of the liver are grossly unremarkable in appearance. The visualized portions of the gallbladder, pancreas, adrenal glands and left kidney are grossly unremarkable, aside from nonspecific left-sided perinephric stranding. The visualized portions of the stomach are within normal limits.  No acute osseous abnormalities are seen. There is diffuse sclerosis involving the vertebral body L1, with associated endplate irregularity, mild loss of height, and vacuum phenomenon. This appears mildly worsened from 2013.  IMPRESSION: 1. No evidence of pneumonia. Perihilar opacities on the chest radiograph may simply  have reflected significant vascular crowding due to lung hypoexpansion. 2. Minimal bibasilar atelectasis noted, with mild bilateral lower lobe bronchiectasis. 3. Diffuse coronary artery calcifications seen. 4. Diffuse sclerotic change involving vertebral body L1, with associated endplate irregularity, mild loss of height and vacuum phenomenon. This appears mildly worsened from 2013, but is likely chronic in nature.   Electronically Signed   By: Garald Balding M.D.   On: 07/15/2013 02:58   US Renal  07/13/2013   CLINICAL DATA:  Chronic kidney disease.  EXAM: RENAL/URINARY TRACT ULTRASOUND COMPLETE  COMPARISON:  CT scan of February 10, 2012.  FINDINGS: Right Kidney:  Length: 10.3 cm. 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 Echogenicity within normal limits. No mass or hydronephrosis visualized.  Left Kidney:  Length: 10.4 cm. Echogenicity within normal limits. No mass or hydronephrosis visualized.  Bladder:  Appears normal for degree of bladder distention.  IMPRESSION: No hydronephrosis or renal obstruction is noted. Congenital mild rotation of right kidney is noted.   Electronically Signed   By: Sabino Dick M.D.   On: 07/13/2013 14:33    Scheduled Meds: . aspirin  81 mg Oral Daily  . furosemide  40 mg Oral Daily  . heparin subcutaneous  5,000 Units Subcutaneous 3 times per day  . levofloxacin (LEVAQUIN) IV  750 mg Intravenous Q48H  . metronidazole  500 mg Intravenous Q8H  . morphine  15 mg Oral Q12H  . pantoprazole  40 mg Oral Daily  . sodium chloride  3 mL Intravenous Q12H   Continuous Infusions:   Principal Problem:   Acute encephalopathy Active Problems:   HYPERLIPIDEMIA   HYPERTENSION   Hyponatremia   Atrial fibrillation   Chronic pain    Time spent: > 35 minutes     Kinnie Feil  [facility] Pager [phone]. If 7PM-7AM, please contact night-coverage at [email or web address], password [facility] 07/15/2013, 1:39 PM  LOS: 3 days

## 2013-07-15 NOTE — Progress Notes (Signed)
  Echocardiogram 2D Echocardiogram has been performed.  Carney Corners 07/15/2013, 2:36 PM

## 2013-07-16 LAB — BASIC METABOLIC PANEL
BUN: 31 mg/dL — ABNORMAL HIGH (ref 6–23)
CALCIUM: 9.5 mg/dL (ref 8.4–10.5)
CO2: 21 mEq/L (ref 19–32)
Chloride: 95 mEq/L — ABNORMAL LOW (ref 96–112)
Creatinine, Ser: 1.41 mg/dL — ABNORMAL HIGH (ref 0.50–1.35)
GFR calc non Af Amer: 48 mL/min — ABNORMAL LOW (ref >90)
GFR, EST AFRICAN AMERICAN: 55 mL/min — AB (ref >90)
Glucose, Bld: 96 mg/dL (ref 70–99)
POTASSIUM: 4.3 meq/L (ref 3.7–5.3)
SODIUM: 130 meq/L — AB (ref 137–147)

## 2013-07-16 MED ORDER — FUROSEMIDE 40 MG PO TABS: 40.0000 mg | ORAL_TABLET | Freq: Every day | ORAL | Status: DC

## 2013-07-16 MED ORDER — LEVOFLOXACIN 500 MG PO TABS: 500.0000 mg | ORAL_TABLET | Freq: Every day | ORAL | Status: DC

## 2013-07-16 MED ORDER — HYDROCODONE-ACETAMINOPHEN 5-325 MG PO TABS: 2.0000 | ORAL_TABLET | Freq: Four times a day (QID) | ORAL | Status: DC | PRN

## 2013-07-16 NOTE — Progress Notes (Signed)
Patient d/ced this afternoon, instructions given to him and son. Educated to follow up with his physician. Assessments remains unchanged prior to discharge.

## 2013-07-16 NOTE — Discharge Summary (Signed)
Physician Discharge Summary  [name] KNL:[id number] DOB: 05/30/1940 DOA: 07/12/2013  PCP: Tamsen Roers, MD  Admit date: 07/12/2013 Discharge date: 07/16/2013  Time spent: >35 minutes  Recommendations for Outpatient Follow-up:  F/u with PCP in 1 week to check BMP F/u with cardiology in 1 week  Discharge Diagnoses:  Principal Problem:   Acute encephalopathy Active Problems:   HYPERLIPIDEMIA   HYPERTENSION   Hyponatremia   Atrial fibrillation   Chronic pain   Discharge Condition: stable   Diet recommendation:  regular   Filed Weights   07/12/13 0532  Weight: 86.183 kg (190 lb)    History of present illness:  73 y.o. male with PMH of CAD, HTN, chronic back pain on long-term morphine 30 mg twice daily was recently changed to fentanyl patch 2 days ago following which patient's mental status changed, pneumonia, hypo Na    Hospital Course:  1. Acute encephalopathy likely metabolic worse in the setting of infection; adjusting opioids  -symptoms resolved; neuro exam no focal; CT: no acute findings; Pt refused MRI;  2. Hyponatremia ? SIADH  -Na improving; started on lasix per nephrology; f/u BMP in 1 week; d/wpatient agreed to f/u with PCP 3. Chronic low back pain. cont opioids; hold NSAIDs, per family patient was taking too much;  -defer further management to pain management as OP;  4. Hypertension. on Coreg.  5. Pneumonia ? aspiration. seen by speech, aspiration precautions  -afebrile; improved on Levaquin; to complete atx OP treatment course  6. CAD denies chest pain; echo: LVEF 50-55%, mild pulmonary HTN  -h/o A fib; per patient, and family: he is not on [facility], just ASA only;  -I have d/w patient, his family recommended to f/u with cardiology in 1 week; they plan to f/u with Dr. Einar Gip -on diuresis with lasix; f/u labs, reevaluate for diuretic needs in 1 week; clinically euvolemic on discharge      Procedures:  echo (i.e. Studies not automatically included, echos,  thoracentesis, etc; not x-rays)  Consultations:  Nephrology   Discharge Exam: Filed Vitals:   07/16/13 1015  BP: 110/69  Pulse: 78  Temp: 98.1 F (36.7 C)  Resp: 18    General: alert Cardiovascular: s1,s2 rrr Respiratory: CTA BL  Discharge Instructions  Discharge Instructions   Diet - low sodium heart healthy    Complete by:  As directed      Discharge instructions    Complete by:  As directed   Please follow up with primary care doctor in 1 week  Please follow up with cardiology in 1 week     Increase activity slowly    Complete by:  As directed             Medication List    STOP taking these medications       ibuprofen 200 MG tablet  Commonly known as:  ADVIL,MOTRIN     naproxen sodium 220 MG tablet  Commonly known as:  ANAPROX     OLANZapine 5 MG tablet  Commonly known as:  ZYPREXA     spironolactone 25 MG tablet  Commonly known as:  ALDACTONE      TAKE these medications       aspirin 81 MG tablet  Take 81 mg by mouth daily.     carvedilol 6.25 MG tablet  Commonly known as:  COREG  Take 6.25 mg by mouth 2 (two) times daily with a meal.     fentaNYL 25 MCG/HR patch  Commonly known as:  DURAGESIC -  dosed mcg/hr  Place 25 mcg onto the skin every 3 (three) days.     furosemide 40 MG tablet  Commonly known as:  LASIX  Take 1 tablet (40 mg total) by mouth daily.     HYDROcodone-acetaminophen 5-325 MG per tablet  Commonly known as:  NORCO/VICODIN  Take 2 tablets by mouth every 6 (six) hours as needed for moderate pain.     levofloxacin 500 MG tablet  Commonly known as:  LEVAQUIN  Take 1 tablet (500 mg total) by mouth daily.     NITROSTAT 0.4 MG SL tablet  Generic drug:  nitroGLYCERIN  Place 0.4 mg under the tongue every 5 (five) minutes as needed for chest pain.     omeprazole-sodium bicarbonate 40-1100 MG per capsule  Commonly known as:  ZEGERID  Take 1 capsule by mouth daily before breakfast.     PROAIR HFA 108 (90 BASE) MCG/ACT  inhaler  Generic drug:  albuterol  Inhale 2 puffs into the lungs every 6 (six) hours as needed for wheezing.       Allergies  Allergen Reactions  . Acetaminophen Other (See Comments)    LIVER INFLAMMATION IN HIGH DOSES       Follow-up Information   Follow up with LITTLE,JAMES, MD. Schedule an appointment as soon as possible for a visit in 1 week.   Specialty:  Family Medicine   Contact information:   [id number] [place] [address] [phone]       Follow up with Laverda Page, MD. Schedule an appointment as soon as possible for a visit in 1 week.   Specialty:  Cardiology   Contact information:   [address] [place] [address] [phone]        The results of significant diagnostics from this hospitalization (including imaging, microbiology, ancillary and laboratory) are listed below for reference.    Significant Diagnostic Studies: Dg Chest 1 View  07/12/2013   CLINICAL DATA:  Altered mental status.  EXAM: CHEST - 1 VIEW  COMPARISON:  Chest radiographs performed 02/19/2013  FINDINGS: The lungs are hypoexpanded. Mild patchy right-sided airspace opacity raises concern for pneumonia, though mild interstitial edema could have a similar appearance. There is no evidence of pleural effusion or pneumothorax.  The cardiomediastinal silhouette is mildly enlarged. No acute osseous abnormalities are seen.  IMPRESSION: Lungs hypoexpanded. Mild patchy right-sided airspace opacity raises concern for pneumonia, though mild interstitial edema could have a similar appearance. Mild cardiomegaly.   Electronically Signed   By: Garald Balding M.D.   On: 07/12/2013 03:13   Ct Head Wo Contrast  07/12/2013   CLINICAL DATA:  Altered mental status.  EXAM: CT HEAD WITHOUT CONTRAST  TECHNIQUE: Contiguous axial images were obtained from the base of the skull through the vertex without intravenous contrast.  COMPARISON:  CT of the head performed 11/23/2012  FINDINGS: There is no  evidence of acute infarction, mass lesion, or intra- or extra-axial hemorrhage on CT.  Prominence of the ventricles and sulci reflects mild to moderate cortical volume loss. Mild periventricular white matter change likely reflects small vessel ischemic microangiopathy.  The brainstem and fourth ventricle are within normal limits. The basal ganglia are unremarkable in appearance. The cerebral hemispheres demonstrate grossly normal gray-white differentiation. No mass effect or midline shift is seen.  There is no evidence of fracture; visualized osseous structures are unremarkable in appearance. The orbits are within normal limits. The paranasal sinuses and mastoid air cells are well-aerated. No significant  soft tissue abnormalities are seen.  IMPRESSION: 1. No acute intracranial pathology seen on CT. 2. Mild to moderate cortical volume loss and scattered small vessel ischemic microangiopathy.   Electronically Signed   By: Garald Balding M.D.   On: 07/12/2013 03:19   Ct Chest Wo Contrast  07/15/2013   CLINICAL DATA:  Recurrent pneumonia.  EXAM: CT CHEST WITHOUT CONTRAST  TECHNIQUE: Multidetector CT imaging of the chest was performed following the standard protocol without IV contrast.  COMPARISON:  Chest radiograph performed 07/12/2013, CTA of the chest performed 03/20/2012, and CT of the abdomen and pelvis performed 02/10/2012  FINDINGS: There is no evidence of focal airspace consolidation, pleural effusion or pneumothorax on this study. Minimal bibasilar atelectasis is noted, with mild bilateral lower lobe bronchiectasis seen. Apparent perihilar opacities on the chest radiograph may simply have reflected significant vascular crowding due to lung hypoexpansion. No masses are identified.  Diffuse coronary artery calcifications are seen. The mediastinum is otherwise unremarkable in appearance. No mediastinal lymphadenopathy is identified. Scattered calcific atherosclerotic disease is noted along the aortic arch and  proximal great vessels. The great vessels are otherwise grossly unremarkable. The thyroid gland is unremarkable in appearance. No axillary lymphadenopathy is seen.  The spleen and visualized portions of the liver are grossly unremarkable in appearance. The visualized portions of the gallbladder, pancreas, adrenal glands and left kidney are grossly unremarkable, aside from nonspecific left-sided perinephric stranding. The visualized portions of the stomach are within normal limits.  No acute osseous abnormalities are seen. There is diffuse sclerosis involving the vertebral body L1, with associated endplate irregularity, mild loss of height, and vacuum phenomenon. This appears mildly worsened from 2013.  IMPRESSION: 1. No evidence of pneumonia. Perihilar opacities on the chest radiograph may simply have reflected significant vascular crowding due to lung hypoexpansion. 2. Minimal bibasilar atelectasis noted, with mild bilateral lower lobe bronchiectasis. 3. Diffuse coronary artery calcifications seen. 4. Diffuse sclerotic change involving vertebral body L1, with associated endplate irregularity, mild loss of height and vacuum phenomenon. This appears mildly worsened from 2013, but is likely chronic in nature.   Electronically Signed   By: Garald Balding M.D.   On: 07/15/2013 02:58   US Renal  07/13/2013   CLINICAL DATA:  Chronic kidney disease.  EXAM: RENAL/URINARY TRACT ULTRASOUND COMPLETE  COMPARISON:  CT scan of February 10, 2012.  FINDINGS: Right Kidney:  Length: 10.3 cm. 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 Echogenicity within normal limits. No mass or hydronephrosis visualized.  Left Kidney:  Length: 10.4 cm. Echogenicity within normal limits. No mass or hydronephrosis visualized.  Bladder:  Appears normal for degree of bladder distention.  IMPRESSION: No hydronephrosis or renal obstruction is noted. Congenital mild rotation of right kidney is noted.   Electronically  Signed   By: Sabino Dick M.D.   On: 07/13/2013 14:33    Microbiology: Recent Results (from the past 240 hour(s))  URINE CULTURE     Status: None   Collection Time    07/12/13  8:02 AM      Result Value Ref Range Status   Specimen Description URINE, CLEAN CATCH   Final   Special Requests NONE   Final   Culture  Setup Time     Final   Value: 07/12/2013 16:44     Performed at [place]     Final   Value: NO GROWTH     Performed at [facility]  Culture     Final   Value: NO GROWTH     Performed at [facility]   Report Status 07/12/2013 FINAL   Final     Labs: Basic Metabolic Panel:  Recent Labs Lab 07/12/13 1415 07/13/13 0550 07/14/13 0456 07/15/13 0527 07/16/13 0538  NA 122* 124* 128* 132* 130*  K 4.0 3.8 4.2 4.7 4.3  CL 85* 87* 92* 96 95*  CO2 22 24 19 20 21   GLUCOSE 97 83 98 95 96  BUN 26* 26* 28* 33* 31*  CREATININE 1.31 1.46* 1.55* 1.59* 1.41*  CALCIUM 8.9 9.0 9.8 9.8 9.5  PHOS  --   --  3.6 3.6  --    Liver Function Tests:  Recent Labs Lab 07/12/13 0127 07/12/13 0835 07/14/13 0456 07/15/13 0527  AST 27 31  --   --   ALT 16 15  --   --   ALKPHOS 102 86  --   --   BILITOT 0.4 0.5  --   --   PROT 7.7 6.7  --   --   ALBUMIN 4.2 3.6 3.8 3.6   No results found for this basename: LIPASE, AMYLASE,  in the last 168 hours  Recent Labs Lab 07/12/13 0835  AMMONIA 20   CBC:  Recent Labs Lab 07/12/13 0127 07/13/13 0550 07/14/13 0456 07/15/13 0527  WBC 17.1* 8.9 9.0 11.1*  NEUTROABS 13.9*  --   --   --   HGB 13.4 11.5* 13.5 13.8  HCT 37.4* 33.7* 39.3 41.3  MCV 79.6 81.4 81.2 82.9  PLT 264 198 219 244   Cardiac Enzymes:  Recent Labs Lab 07/12/13 0128 07/12/13 0835  TROPONINI <0.30 <0.30   BNP: BNP (last 3 results)  Recent Labs  12/01/12 1900 07/12/13 0835  PROBNP 1124.0* 13744.0*   CBG:  Recent Labs Lab 07/14/13 1204 07/14/13 1645 07/15/13 1136 07/15/13 1629 07/15/13 2142  GLUCAP  135* 134* 115* 136* 119*       Signed:  Arie Sabina Reatha Sur  [facility] 07/16/2013, 12:43 PM

## 2013-07-20 ENCOUNTER — Other Ambulatory Visit: Payer: Self-pay | Admitting: Pain Medicine

## 2013-07-20 DIAGNOSIS — M545 Low back pain, unspecified: Secondary | ICD-10-CM

## 2013-07-21 ENCOUNTER — Other Ambulatory Visit: Payer: Medicare Other

## 2013-07-23 ENCOUNTER — Ambulatory Visit
Admission: RE | Admit: 2013-07-23 | Discharge: 2013-07-23 | Disposition: A | Discharge status: Home / Self Care | Payer: Medicare Other | Source: Ambulatory Visit | Attending: Pain Medicine | Admitting: Pain Medicine

## 2013-07-23 ENCOUNTER — Encounter: Payer: Self-pay | Admitting: Physical Medicine & Rehabilitation

## 2013-07-23 DIAGNOSIS — M545 Low back pain, unspecified: Secondary | ICD-10-CM

## 2013-09-22 ENCOUNTER — Ambulatory Visit: Payer: Medicare Other | Admitting: Physician Assistant

## 2013-09-22 DIAGNOSIS — Z0289 Encounter for other administrative examinations: Secondary | ICD-10-CM

## 2014-01-18 ENCOUNTER — Emergency Department (HOSPITAL_COMMUNITY)
Admission: EM | Admit: 2014-01-18 | Discharge: 2014-01-18 | Disposition: A | Discharge status: Home / Self Care | Payer: Medicare Other | Attending: Emergency Medicine | Admitting: Emergency Medicine

## 2014-01-18 ENCOUNTER — Encounter (HOSPITAL_COMMUNITY): Payer: Self-pay | Admitting: Emergency Medicine

## 2014-01-18 ENCOUNTER — Emergency Department (HOSPITAL_COMMUNITY): Payer: Medicare Other

## 2014-01-18 DIAGNOSIS — Z862 Personal history of diseases of the blood and blood-forming organs and certain disorders involving the immune mechanism: Secondary | ICD-10-CM | POA: Insufficient documentation

## 2014-01-18 DIAGNOSIS — J159 Unspecified bacterial pneumonia: Secondary | ICD-10-CM | POA: Diagnosis not present

## 2014-01-18 DIAGNOSIS — W01198A Fall on same level from slipping, tripping and stumbling with subsequent striking against other object, initial encounter: Secondary | ICD-10-CM | POA: Insufficient documentation

## 2014-01-18 DIAGNOSIS — Z792 Long term (current) use of antibiotics: Secondary | ICD-10-CM | POA: Insufficient documentation

## 2014-01-18 DIAGNOSIS — F329 Major depressive disorder, single episode, unspecified: Secondary | ICD-10-CM | POA: Diagnosis not present

## 2014-01-18 DIAGNOSIS — Z8639 Personal history of other endocrine, nutritional and metabolic disease: Secondary | ICD-10-CM | POA: Insufficient documentation

## 2014-01-18 DIAGNOSIS — Y998 Other external cause status: Secondary | ICD-10-CM | POA: Diagnosis not present

## 2014-01-18 DIAGNOSIS — Z8669 Personal history of other diseases of the nervous system and sense organs: Secondary | ICD-10-CM | POA: Insufficient documentation

## 2014-01-18 DIAGNOSIS — Z79899 Other long term (current) drug therapy: Secondary | ICD-10-CM | POA: Insufficient documentation

## 2014-01-18 DIAGNOSIS — Z9861 Coronary angioplasty status: Secondary | ICD-10-CM | POA: Diagnosis not present

## 2014-01-18 DIAGNOSIS — Z8701 Personal history of pneumonia (recurrent): Secondary | ICD-10-CM | POA: Insufficient documentation

## 2014-01-18 DIAGNOSIS — Z87448 Personal history of other diseases of urinary system: Secondary | ICD-10-CM | POA: Insufficient documentation

## 2014-01-18 DIAGNOSIS — M13869 Other specified arthritis, unspecified knee: Secondary | ICD-10-CM | POA: Diagnosis not present

## 2014-01-18 DIAGNOSIS — Z87891 Personal history of nicotine dependence: Secondary | ICD-10-CM | POA: Insufficient documentation

## 2014-01-18 DIAGNOSIS — I10 Essential (primary) hypertension: Secondary | ICD-10-CM | POA: Insufficient documentation

## 2014-01-18 DIAGNOSIS — S299XXA Unspecified injury of thorax, initial encounter: Secondary | ICD-10-CM | POA: Insufficient documentation

## 2014-01-18 DIAGNOSIS — I499 Cardiac arrhythmia, unspecified: Secondary | ICD-10-CM | POA: Diagnosis not present

## 2014-01-18 DIAGNOSIS — Y9389 Activity, other specified: Secondary | ICD-10-CM | POA: Diagnosis not present

## 2014-01-18 DIAGNOSIS — I252 Old myocardial infarction: Secondary | ICD-10-CM | POA: Diagnosis not present

## 2014-01-18 DIAGNOSIS — Z7982 Long term (current) use of aspirin: Secondary | ICD-10-CM | POA: Insufficient documentation

## 2014-01-18 DIAGNOSIS — I4891 Unspecified atrial fibrillation: Secondary | ICD-10-CM | POA: Diagnosis present

## 2014-01-18 DIAGNOSIS — J189 Pneumonia, unspecified organism: Secondary | ICD-10-CM

## 2014-01-18 DIAGNOSIS — I2510 Atherosclerotic heart disease of native coronary artery without angina pectoris: Secondary | ICD-10-CM | POA: Diagnosis not present

## 2014-01-18 DIAGNOSIS — Y9289 Other specified places as the place of occurrence of the external cause: Secondary | ICD-10-CM | POA: Insufficient documentation

## 2014-01-18 DIAGNOSIS — R079 Chest pain, unspecified: Secondary | ICD-10-CM

## 2014-01-18 DIAGNOSIS — Z85820 Personal history of malignant melanoma of skin: Secondary | ICD-10-CM | POA: Insufficient documentation

## 2014-01-18 DIAGNOSIS — K219 Gastro-esophageal reflux disease without esophagitis: Secondary | ICD-10-CM | POA: Insufficient documentation

## 2014-01-18 LAB — CBC WITH DIFFERENTIAL/PLATELET
BASOS ABS: 0 10*3/uL (ref 0.0–0.1)
BASOS PCT: 0 % (ref 0–1)
EOS ABS: 0.1 10*3/uL (ref 0.0–0.7)
EOS PCT: 1 % (ref 0–5)
HEMATOCRIT: 36.8 % — AB (ref 39.0–52.0)
Hemoglobin: 11.8 g/dL — ABNORMAL LOW (ref 13.0–17.0)
Lymphocytes Relative: 9 % — ABNORMAL LOW (ref 12–46)
Lymphs Abs: 1.8 10*3/uL (ref 0.7–4.0)
MCH: 27.4 pg (ref 26.0–34.0)
MCHC: 32.1 g/dL (ref 30.0–36.0)
MCV: 85.4 fL (ref 78.0–100.0)
MONO ABS: 1.1 10*3/uL — AB (ref 0.1–1.0)
Monocytes Relative: 5 % (ref 3–12)
NEUTROS ABS: 17.3 10*3/uL — AB (ref 1.7–7.7)
Neutrophils Relative %: 85 % — ABNORMAL HIGH (ref 43–77)
Platelets: 238 10*3/uL (ref 150–400)
RBC: 4.31 MIL/uL (ref 4.22–5.81)
RDW: 15.5 % (ref 11.5–15.5)
WBC: 20.3 10*3/uL — ABNORMAL HIGH (ref 4.0–10.5)

## 2014-01-18 LAB — COMPREHENSIVE METABOLIC PANEL
ALBUMIN: 3 g/dL — AB (ref 3.5–5.2)
ALT: 14 U/L (ref 0–53)
AST: 16 U/L (ref 0–37)
Alkaline Phosphatase: 84 U/L (ref 39–117)
Anion gap: 17 — ABNORMAL HIGH (ref 5–15)
BUN: 39 mg/dL — AB (ref 6–23)
CALCIUM: 8.9 mg/dL (ref 8.4–10.5)
CHLORIDE: 97 meq/L (ref 96–112)
CO2: 19 mEq/L (ref 19–32)
Creatinine, Ser: 1.91 mg/dL — ABNORMAL HIGH (ref 0.50–1.35)
GFR calc Af Amer: 38 mL/min — ABNORMAL LOW (ref >90)
GFR calc non Af Amer: 33 mL/min — ABNORMAL LOW (ref >90)
Glucose, Bld: 117 mg/dL — ABNORMAL HIGH (ref 70–99)
Potassium: 4.8 mEq/L (ref 3.7–5.3)
Sodium: 133 mEq/L — ABNORMAL LOW (ref 137–147)
TOTAL PROTEIN: 6.7 g/dL (ref 6.0–8.3)
Total Bilirubin: 0.5 mg/dL (ref 0.3–1.2)

## 2014-01-18 LAB — TROPONIN I: Troponin I: <0.3 ng/mL (ref <0.30)

## 2014-01-18 MED ORDER — LEVOFLOXACIN 500 MG PO TABS: 500.0000 mg | ORAL_TABLET | Freq: Every day | ORAL | Status: AC

## 2014-01-18 MED ORDER — SODIUM CHLORIDE 0.9 % IV BOLUS (SEPSIS)
1000.0000 mL | Freq: Once | INTRAVENOUS | Status: AC
  Administered 2014-01-18: 1000 mL via INTRAVENOUS

## 2014-01-18 MED ORDER — LEVOFLOXACIN IN D5W 500 MG/100ML IV SOLN
500.0000 mg | Freq: Once | INTRAVENOUS | Status: AC
  Administered 2014-01-18: 500 mg via INTRAVENOUS
  Filled 2014-01-18: qty 100

## 2014-01-18 MED ORDER — TRAMADOL HCL 50 MG PO TABS
50.0000 mg | ORAL_TABLET | Freq: Once | ORAL | Status: AC
  Administered 2014-01-18: 50 mg via ORAL
  Filled 2014-01-18: qty 1

## 2014-01-18 NOTE — ED Notes (Addendum)
Pt fell yesterday, hit left chest on coffee table, pt is on blood thinners, tender upon palpation; went to fire department to have his bp checked because he had been having "sky high blood pressure at home", found that he was in rapid a fib with rate of 150. For EMS pt was in 120's; denies sob, chest pain, dizziness, etc.

## 2014-01-18 NOTE — ED Notes (Signed)
Pt ambulating around room with steady gait, c/o headache and back pain. Pt c/o frontal headache. Dr. Vanita Panda made aware

## 2014-01-18 NOTE — ED Provider Notes (Signed)
CSN: [id number]     Arrival date & time 01/18/14  1559 History   First MD Initiated Contact with Patient 01/18/14 1606     Chief Complaint  Patient presents with  . Atrial Fibrillation     (Consider location/radiation/quality/duration/timing/severity/associated sxs/prior Treatment) HPI Patient presents with concern of hypertension, in addition to left sided inferior anterior rib pain. Patient had a mechanical fall 3 days ago, onto a table.  Since that time he has had pain focally about the left anterior inferior parasternal area.  Pain is sore, nonradiating, not exertional or pleuritic. Pain has been unremarkable for the past few days. The patient's wife noticed that the patient's blood pressure was elevated. He was referred to far department for evaluation.  There, paramedics sent him here for further evaluation due to chest pain, concern for hypertension. Patient had no documented blood pressure readings at that facility Patient currently denies any, and all other complaints. He states that his chest only hurts when pressure is applied.  Past Medical History  Diagnosis Date  . Hypertension   . Hyperlipidemia   . Preoperative cardiovascular examination   . Spinal stenosis, lumbar   . CAS (cerebral atherosclerosis)   . Erectile dysfunction   . Preventative health care   . Benign prostatic hypertrophy with urinary obstruction   . Family history of early CAD     male 1st degree relative <50  . Peptic ulcer disease   . Low back pain   . Superficial spreading melanoma   . Sleep apnea, obstructive     CPAP-does not use  . HA (headache)     sinus headaches  . Arthritis     knees  . Myocardial infarction 1997    Hx MI 1997 and 1998  . Anxiety   . Pneumonia 2005  . Anemia   . Depression     takes Zoloft  . Neuropathy, autonomic, idiopathic peripheral, other   . Blood transfusion 1 yr. ago    jerking,fever-after blood transfusion  . Blood transfusion     given wrong blood    . Coronary artery disease 1998    cardiac stent/ Clearance Dr Arnoldo Morale and Einar Gip with note on chart  . Dysrhythmia     hx of atrial fib per ov note of 12/12   . Bradycardia     hx of bradycardia in past per ov note of 12/12  . GERD (gastroesophageal reflux disease)    Past Surgical History  Procedure Laterality Date  . Esophagogastroduodenoscopy  2004  . Colonoscopy  2004  . Lumbar laminectomy    . Lumbar fusion    . Knee arthroscopy      left  . Shoulder arthroscopy      right  . Knee arthroscopy      right  . Decompression of the median nerve      left wrist and hand  . Revision of tkr  2011    on left  . Coronary stent placement    . Back surgery  2005-last    lumbar x2  . Excisional total knee arthroplasty  12/25/2010    Procedure: EXCISIONAL TOTAL KNEE ARTHROPLASTY;  Surgeon: Mauri Pole;  Location: [facility];  Service: Orthopedics;  Laterality: Right;  repeat irrigation   and debridement, removal and reinsertation of spacer block  . Joint replacement  2011    left knee x 2 ; 08/2010-right knee-infected  . I&d extremity  03/25/2011    Procedure: IRRIGATION AND DEBRIDEMENT EXTREMITY;  Surgeon:  Mauri Pole, MD;  Location: [facility];  Service: Orthopedics;  Laterality: Right;  . Coronary angioplasty  [place]   . Total knee revision  05/13/2011    Procedure: TOTAL KNEE REVISION;  Surgeon: Mauri Pole, MD;  Location: [facility];  Service: Orthopedics;  Laterality: Right;  Reimplantation   Family History  Problem Relation Age of Onset  . Heart disease Father   . Heart disease Mother   . Diabetes Sister   . Heart disease Brother   . Breast cancer Brother   . Breast cancer Sister    History  Substance Use Topics  . Smoking status: Former Smoker -- 1.00 packs/day for 15 years    Types: Cigars    Quit date: 12/23/2009  . Smokeless tobacco: Never Used     Comment: smoked a pipe for 6-7 y ears  . Alcohol Use: No    Review of Systems  Constitutional:        Per HPI, otherwise negative  HENT:       Per HPI, otherwise negative  Respiratory:       Per HPI, otherwise negative  Cardiovascular:       Per HPI, otherwise negative  Gastrointestinal: Negative for vomiting.  Endocrine:       Negative aside from HPI  Genitourinary:       Neg aside from HPI   Musculoskeletal:       Per HPI, otherwise negative  Skin: Negative.   Neurological: Negative for syncope.      Allergies  Acetaminophen  Home Medications   Prior to Admission medications   Medication Sig Start Date End Date Taking? Authorizing Provider  aspirin 81 MG tablet Take 81 mg by mouth daily.    Historical Provider, MD  carvedilol (COREG) 6.25 MG tablet Take 6.25 mg by mouth 2 (two) times daily with a meal.    Historical Provider, MD  fentaNYL (DURAGESIC - DOSED MCG/HR) 25 MCG/HR patch Place 25 mcg onto the skin every 3 (three) days.    Historical Provider, MD  furosemide (LASIX) 40 MG tablet Take 1 tablet (40 mg total) by mouth daily. 07/16/13   Kinnie Feil, MD  HYDROcodone-acetaminophen (NORCO/VICODIN) 5-325 MG per tablet Take 2 tablets by mouth every 6 (six) hours as needed for moderate pain. 07/16/13   Kinnie Feil, MD  levofloxacin (LEVAQUIN) 500 MG tablet Take 1 tablet (500 mg total) by mouth daily. 07/16/13   Kinnie Feil, MD  NITROSTAT 0.4 MG SL tablet Place 0.4 mg under the tongue every 5 (five) minutes as needed for chest pain.  05/22/12   Historical Provider, MD  omeprazole-sodium bicarbonate (ZEGERID) 40-1100 MG per capsule Take 1 capsule by mouth daily before breakfast. 04/19/13   Jerene Bears, MD  PROAIR HFA 108 (90 BASE) MCG/ACT inhaler Inhale 2 puffs into the lungs every 6 (six) hours as needed for wheezing.  05/24/13   Historical Provider, MD   BP 112/71 mmHg  Pulse 94  Temp(Src) 97.9 F (36.6 C) (Oral)  Resp 24  SpO2 96% Physical Exam  Constitutional: He is oriented to person, place, and time. He appears well-developed. No distress.  HENT:  Head:  Normocephalic and atraumatic.  Eyes: Conjunctivae and EOM are normal.  Cardiovascular: Normal rate.  An irregularly irregular rhythm present.  Pulmonary/Chest: Effort normal. No stridor. No respiratory distress.    Abdominal: He exhibits no distension.  Musculoskeletal: He exhibits no edema.  Neurological: He is alert and  oriented to person, place, and time.  Skin: Skin is warm and dry.  Psychiatric: He has a normal mood and affect.  Nursing note and vitals reviewed.   ED Course  Procedures (including critical care time) Labs Review Labs Reviewed  CBC WITH DIFFERENTIAL  COMPREHENSIVE METABOLIC PANEL  TROPONIN I    Imaging Review No results found.   EKG Interpretation   Date/Time:  Tuesday January 18 2014 16:05:32 EST Ventricular Rate:  77 PR Interval:    QRS Duration: 93 QT Interval:  355 QTC Calculation: 402 R Axis:   -32 Text Interpretation:  Atrial fibrillation Left axis deviation Abnormal T,  consider ischemia, lateral leads Atrial fibrillation T wave abnormality  Abnormal ekg Confirmed by Carmin Muskrat  MD [phone]) on 01/18/2014 4:28:35  PM     Cardiac monitor atrial fibrillation 75, abnormal Pulse oximetry 100% room air normal  Update: Patient's son is now present.  He states that the patient has had cough for several days, had a fever earlier today. The patient now acknowledges this illness. Patient's blood pressure remains borderline low, 93/46. Patient worse even initial antibiotic, fluid resuscitation here.  8:23 PM Patient awake and alert, in no distress.  Blood pressure has improved, no new complaints.   MDM   Final diagnoses:  Chest pain    Patient presents with concern for blood pressure issues, atrial fibrillation. Eventually it becomes clear that the patient has had ongoing cough, fever, given the patient's leukocytosis, there is some concern for pneumonia. No evidence for bacteremia, sepsis. Patient tolerated fluids, antibiotics well,  was discharged in stable condition to follow-up with primary care.  Carmin Muskrat, MD 01/18/14 2024

## 2014-01-18 NOTE — Discharge Instructions (Signed)
As discussed, it is important that you follow up as soon as possible with your physician (via telephone) for continued management of your condition. ° °If you develop any new, or concerning changes in your condition, please return to the emergency department immediately. ° ° ° °

## 2014-01-18 NOTE — ED Notes (Addendum)
Pt hooked up to monitor, a fib at rate of 89. Pt doesn't think he needs to be here. Wife at bedside. Pt denies any pain, sob, dizziness, etc. Pt denies LOC yesterday when he fell, denies hitting his head at all; left chest tender to touch. No obvious bruising or deformity.

## 2014-01-19 ENCOUNTER — Telehealth (HOSPITAL_BASED_OUTPATIENT_CLINIC_OR_DEPARTMENT_OTHER): Payer: Self-pay | Admitting: Emergency Medicine

## 2014-02-18 DIAGNOSIS — G4733 Obstructive sleep apnea (adult) (pediatric): Secondary | ICD-10-CM | POA: Diagnosis not present

## 2014-02-24 DIAGNOSIS — E780 Pure hypercholesterolemia: Secondary | ICD-10-CM | POA: Diagnosis not present

## 2014-02-24 DIAGNOSIS — I10 Essential (primary) hypertension: Secondary | ICD-10-CM | POA: Diagnosis not present

## 2014-02-24 DIAGNOSIS — I482 Chronic atrial fibrillation: Secondary | ICD-10-CM | POA: Diagnosis not present

## 2014-02-24 DIAGNOSIS — Z7901 Long term (current) use of anticoagulants: Secondary | ICD-10-CM | POA: Diagnosis not present

## 2014-03-03 ENCOUNTER — Encounter (HOSPITAL_COMMUNITY): Payer: Self-pay | Admitting: Orthopedic Surgery

## 2014-03-03 DIAGNOSIS — I4892 Unspecified atrial flutter: Secondary | ICD-10-CM | POA: Diagnosis not present

## 2014-03-03 DIAGNOSIS — I10 Essential (primary) hypertension: Secondary | ICD-10-CM | POA: Diagnosis not present

## 2014-03-03 DIAGNOSIS — G894 Chronic pain syndrome: Secondary | ICD-10-CM | POA: Diagnosis not present

## 2014-03-03 DIAGNOSIS — I259 Chronic ischemic heart disease, unspecified: Secondary | ICD-10-CM | POA: Diagnosis not present

## 2014-03-16 ENCOUNTER — Encounter: Payer: Self-pay | Admitting: Neurology

## 2014-03-16 ENCOUNTER — Ambulatory Visit (INDEPENDENT_AMBULATORY_CARE_PROVIDER_SITE_OTHER): Payer: 59 | Admitting: Neurology

## 2014-03-16 VITALS — BP 135/67 | HR 54 | Ht 67.0 in | Wt 214.0 lb

## 2014-03-16 DIAGNOSIS — M6250 Muscle wasting and atrophy, not elsewhere classified, unspecified site: Secondary | ICD-10-CM | POA: Diagnosis not present

## 2014-03-16 DIAGNOSIS — R253 Fasciculation: Secondary | ICD-10-CM | POA: Diagnosis not present

## 2014-03-16 DIAGNOSIS — M6258 Muscle wasting and atrophy, not elsewhere classified, other site: Secondary | ICD-10-CM

## 2014-03-16 DIAGNOSIS — G6289 Other specified polyneuropathies: Secondary | ICD-10-CM | POA: Diagnosis not present

## 2014-03-16 DIAGNOSIS — R270 Ataxia, unspecified: Secondary | ICD-10-CM | POA: Diagnosis not present

## 2014-03-16 DIAGNOSIS — G609 Hereditary and idiopathic neuropathy, unspecified: Secondary | ICD-10-CM

## 2014-03-16 DIAGNOSIS — G608 Other hereditary and idiopathic neuropathies: Secondary | ICD-10-CM | POA: Diagnosis not present

## 2014-03-16 DIAGNOSIS — R29898 Other symptoms and signs involving the musculoskeletal system: Secondary | ICD-10-CM | POA: Insufficient documentation

## 2014-03-16 DIAGNOSIS — R259 Unspecified abnormal involuntary movements: Secondary | ICD-10-CM | POA: Diagnosis not present

## 2014-03-16 DIAGNOSIS — R279 Unspecified lack of coordination: Secondary | ICD-10-CM | POA: Diagnosis not present

## 2014-03-16 NOTE — Patient Instructions (Signed)

## 2014-03-16 NOTE — Progress Notes (Signed)
Provider:  Larey Seat, M D  Referring Provider: Tamsen Roers, MD Primary Care Physician:  Tamsen Roers, MD  Chief Complaint  Patient presents with  . NP [place] Neuropathy    Rm 10, spouse-[name], Daughter- Sherri     HPI:  [name] is a 74 y.o. male   seen here as a referral from pain specialist Dr Vira Blanco to my partner Dr. Krista Blue , but I  have seen this patient 4-5 years ago and thus will be evaluating him today.  [name] is accompanied by his wife and daughter today he is referred by referred pain management and spine care on [facility] here in [place]. His chief complaint is back pain lower back pain, lower extremity pain, painful neuropathy. He also has a diagnosis of osteoarthritis. I understand that in May 2015 this patient was admitted to hospital for mental confusion and delirium related to an overdose of pain medication. Hydrocodone and hydromorphone had been prescribed. He further has a diagnosis of atrial fibrillation and used to be followed by Dr. Tamsen Roers and Dr. Donalee Citrin. In 2012 he had reported to me and a visit that he had increasingly frequent falls and loss of grip strengths. He could not stand or maintain a standing posture when his eyes were closed he would fall forward usually propulsive falls reported had developed over the year 2011 as did the failing grip strengths. He had also increasing weakness in the upper extremities at the shoulder level and had noted muscle wasting in the left thigh.  He is taking daily pain medication and reports that the pain medication gives him relief so he is not always aware of pain as long as treated. He comes immediately aware when he skips a dose. He was heavily using morphine and only over the last 2 years went to hydrocodone , which allows for more mental clarity.   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        Review of Systems: Out of a complete 14 system review, the patient complains of only the following symptoms, and all other reviewed systems are negative. Pain full sensory , 8 out of 10 pain level. Throbbing pain , lower back and legs.   History   Social History  . Marital Status: Married    Spouse Name: Vaughan Basta    Number of Children: 4  . Years of Education: HS   Occupational History  . diesel repair     part time  . worked at [employer]   . 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 Smoking status: Former Smoker -- 1.00 packs/day for 15 years    Types: Cigars    Quit date: 12/23/2009  . Smokeless tobacco: Never Used     Comment: smoked a pipe for 6-7 y ears  . Alcohol Use: No  . Drug Use: No  . Sexual Activity: Not on file   Other Topics Concern  . Not on file   Social History Narrative   Caffeine 2 cups daily avg.     Family History  Problem Relation Age of Onset  . Heart disease Father   . Heart disease Mother   . Diabetes Sister   . Heart disease Brother   . Breast cancer Sister   . Breast cancer Sister     Past Medical History  Diagnosis Date  . Hypertension   . Hyperlipidemia   . Preoperative cardiovascular examination   . Spinal stenosis, lumbar   . CAS (cerebral atherosclerosis)   . Erectile dysfunction   . Preventative health care   . Benign prostatic hypertrophy with urinary obstruction   . Family history of early CAD     male 1st degree relative <50  . Peptic ulcer disease   . Low back pain   . Superficial spreading melanoma   . Sleep apnea, obstructive     CPAP-does not use  . HA (headache)     sinus headaches  . Arthritis     knees  . Myocardial infarction 1997    Hx MI 1997 and 1998  . Anxiety   . Pneumonia 2005  . Anemia   . Depression     takes Zoloft  . Neuropathy, autonomic, idiopathic peripheral, other   . Blood transfusion 1 yr. ago    jerking,fever-after blood transfusion  . Blood transfusion     given wrong  blood  . Coronary artery disease 1998    cardiac stent/ Clearance Dr Arnoldo Morale and Einar Gip with note on chart  . Dysrhythmia     hx of atrial fib per ov note of 12/12   . Bradycardia     hx of bradycardia in past per ov note of 12/12  . GERD (gastroesophageal reflux disease)     Past Surgical History  Procedure Laterality Date  . Esophagogastroduodenoscopy  2004  . Colonoscopy  2004  . Lumbar laminectomy    . Lumbar fusion    . Knee arthroscopy      left  . Shoulder arthroscopy      right  . Knee arthroscopy      right  . Decompression of the median nerve      left wrist and hand  . Revision of tkr  2011    on left  . Coronary stent placement    . Back surgery  2005-last    lumbar x2  . Excisional total knee arthroplasty  12/25/2010    Procedure: EXCISIONAL TOTAL KNEE ARTHROPLASTY;  Surgeon: Mauri Pole;  Location: [facility];  Service: Orthopedics;  Laterality: Right;  repeat irrigation   and debridement, removal and reinsertation of spacer block  . Joint replacement  2011    left knee x 2 ; 08/2010-right knee-infected  . I&d extremity  03/25/2011    Procedure: IRRIGATION AND DEBRIDEMENT EXTREMITY;  Surgeon: Mauri Pole, MD;  Location: [facility];  Service: Orthopedics;  Laterality: Right;  . Coronary angioplasty  [place]   . Total knee revision  05/13/2011    Procedure: TOTAL KNEE REVISION;  Surgeon: Mauri Pole, MD;  Location: [facility];  Service: Orthopedics;  Laterality: Right;  Reimplantation    Current Outpatient Prescriptions  Medication Sig Dispense Refill  . apixaban (ELIQUIS) 5 MG TABS tablet Take 5 mg by mouth 2 (two) times daily.    Marland Kitchen atorvastatin (LIPITOR) 10 MG tablet Take 10 mg by mouth daily.    . carvedilol (COREG) 6.25 MG tablet Take 6.25 mg by mouth 2 (two) times daily with a meal.    . escitalopram (LEXAPRO) 10 MG tablet Take 10 mg by mouth daily.    Marland Kitchen gabapentin (NEURONTIN) 100 MG capsule Take 100 mg by mouth 3 (three) times daily.    Marland Kitchen  HYDROcodone-acetaminophen (NORCO/VICODIN) 5-325 MG per tablet Take 2 tablets by mouth every 6 (six) hours as  needed for moderate pain. (Patient taking differently: Take 2 tablets by mouth every 6 (six) hours. ) 30 tablet 0  . OLANZapine (ZYPREXA) 5 MG tablet Take 5 mg by mouth at bedtime.    Marland Kitchen omeprazole-sodium bicarbonate (ZEGERID) 40-1100 MG per capsule Take 1 capsule by mouth daily before breakfast. 90 capsule 3  . spironolactone-hydrochlorothiazide (ALDACTAZIDE) 25-25 MG per tablet Take 1 tablet by mouth daily.    . [DISCONTINUED] zolpidem (AMBIEN) 5 MG tablet Take 1 tablet (5 mg total) by mouth at bedtime as needed for sleep. 20 tablet 0   No current facility-administered medications for this visit.    Allergies as of 03/16/2014 - Review Complete 03/16/2014  Allergen Reaction Noted  . Acetaminophen Other (See Comments)   . Lasix [furosemide]  03/16/2014    Vitals: BP 135/67 mmHg  Pulse 54  Ht 5\' 7"  (1.702 m)  Wt 214 lb (97.07 kg)  BMI 33.51 kg/m2 Last Weight:  Wt Readings from Last 1 Encounters:  03/16/14 214 lb (97.07 kg)   Last Height:   Ht Readings from Last 1 Encounters:  03/16/14 5\' 7"  (1.702 m)    Physical exam:  General: The patient is awake, alert and appears not in acute distress.  The patient is well groomed. Head: Normocephalic, atraumatic. Neck is supple. Mallampati 4 , neck circumference: 17.5 inches.  Cardiovascular:  irregular rate and rhythm, a fib without  murmurs or carotid bruit, and without distended neck veins. Respiratory: Lungs are clear to auscultation. Skin:  Without evidence of edema, or rash Trunk: BMI is  elevated and patient  has normal posture.  Neurologic exam : The patient is awake and alert, oriented to place and time.  Memory subjective  described as impaired.  He is very quiet.   There is a normal attention span & concentration ability.  Speech is fluent ,without  dysarthria, dysphonia or aphasia. Mood and affect are  depressed. Cranial nerves: Pupils are equal reactive to light.  round - he has not had   lens replacements. Funduscopic exam without   evidence of pallor or edema. Extraocular movements  in vertical and horizontal planes intact and without nystagmus.  Visual fields by finger perimetry are intact. Hearing to finger rub intact.  Facial sensation intact to fine touch. Facial motor strength is symmetric and tongue and uvula move midline.  Tongue protrusion into either cheek is normal. Shoulder shrug is normal.   Motor exam:    Mr. Yuan also has some significant neck stiffness and pain at the occipital notch and paraspinal levels mostly C5 and C6 paraspinal left more than right. He has lower back pain that seems to extend from the lower thoracic spine bilaterally paraspinally down to the sacral curvature. He states that he feels some pain relief with massage or compression.  there is some visible fasciculation over the left anterior brachium. The patient has a left-sided stronger grip strength than on the right. His skin is scaly and dry their multiple petechiae and hematomata.  Mr. Marchena has significant atrophy of the quadriceps muscles on the left lower extremity. He has osteoarthritis as well ,noted crepitation over both knees . His muscle tone seems to be elevated - waxy resistance not spastic but elevated tone.  He is unable to wiggle his toes or perform a full range of motion at the ankle for either foot.  He walks with a walker is evident that he leans forward to support himself he also could not raise from a seated position without  bracing and actually made 2 attempts to resume an erect position.  Sensory:  Fine touch, pinprick and vibration were absent or severely reduced in both feet up to the knee level. The skin of both lower extremities has changed is shiny a little bit of edema to [place], he has lost hair and he has visible scars from  Surgery. Coordination: Rapid alternating movements in  the fingers/hands were slowed ,very mild  tremor.  Gait and station: Patient walks with assistive device but  is able unassisted to climb up to the exam table.  Deep tendon reflexes: in the  upper and lower extremities are symmetric/ attenuated  Achilles tendon reflex is absent . Babinski maneuver response is equivocal.   Assessment:  After physical and neurologic examination, review of laboratory studies, imaging, neurophysiology testing and pre-existing records, assessment is that of :  Progressive painful neuropathy present for almost 6 years now, the neuropathy has the hallmarks of a sensory neuropathy first but it definitely secondarily affected his muscle strengths. In addition I had suspected that the patient may have spinal stenosis but according to Dr. Jodene Nam notes this was not confirmed. He has been followed for his pain medication management and takes currently Narco and gabapentin prescribed by Dr. Vira Blanco. I will refer the patient to Dr. Krista Blue to evaluate for an axonal neuropathy which could be resulting in gait ataxia propulsive falls of which she had multiple and his need now to rely on a walker. He has definitely loss of sensory profoundly present in both feet and the level now exceeds the knees. The patient is not diabetic and has no proven thyroid disease he had a positive a and a test for lupus or possible lupus 6 years ago however this was not further specified I would also suggest for the patient to be further worked up for a possible radiculopathy at this C6 level affecting the left upper extremity more than the right. And for C7 on the right. Dr. Krista Blue, who is specialized in neuromuscular diseases can perform these tests for [place] and we will meet to discuss the condition afterwards. I will order a whole lap panel for genetic markers outer immune markers and vitamin deficiencies or metabolic disorders that can result in sensory neuropathy.  Plan:  Treatment plan and additional workup  :  MRI  thoraco lumbal-  EMG and NCV with Dr Krista Blue , whom the patient and his wife have requested.  ANA, SPEP, CBC diff and CMET, TSH.      Asencion Partridge Zerek Litsey MD 03/16/2014

## 2014-03-18 LAB — COMPREHENSIVE METABOLIC PANEL
A/G RATIO: 1.6 (ref 1.1–2.5)
ALBUMIN: 4.2 g/dL (ref 3.5–4.8)
ALT: 11 IU/L (ref 0–44)
AST: 14 IU/L (ref 0–40)
Alkaline Phosphatase: 90 IU/L (ref 39–117)
BUN/Creatinine Ratio: 22 (ref 10–22)
BUN: 40 mg/dL — AB (ref 8–27)
CO2: 22 mmol/L (ref 18–29)
Calcium: 9.5 mg/dL (ref 8.6–10.2)
Chloride: 89 mmol/L — ABNORMAL LOW (ref 97–108)
Creatinine, Ser: 1.85 mg/dL — ABNORMAL HIGH (ref 0.76–1.27)
GFR calc non Af Amer: 35 mL/min/{1.73_m2} — ABNORMAL LOW (ref >59)
GFR, EST AFRICAN AMERICAN: 40 mL/min/{1.73_m2} — AB (ref >59)
Globulin, Total: 2.7 g/dL (ref 1.5–4.5)
Glucose: 92 mg/dL (ref 65–99)
Potassium: 5.8 mmol/L — ABNORMAL HIGH (ref 3.5–5.2)
SODIUM: 126 mmol/L — AB (ref 134–144)
TOTAL PROTEIN: 6.9 g/dL (ref 6.0–8.5)
Total Bilirubin: 0.3 mg/dL (ref 0.0–1.2)

## 2014-03-18 LAB — PROTEIN ELECTROPHORESIS, SERUM
A/G Ratio: 1.2 (ref 0.7–2.0)
Albumin ELP: 3.8 g/dL (ref 3.2–5.6)
Alpha 1: 0.2 g/dL (ref 0.1–0.4)
Alpha 2: 0.8 g/dL (ref 0.4–1.2)
BETA: 1 g/dL (ref 0.6–1.3)
GAMMA GLOBULIN: 1.1 g/dL (ref 0.5–1.6)
GLOBULIN, TOTAL: 3.1 g/dL (ref 2.0–4.5)

## 2014-03-18 LAB — ANA W/REFLEX IF POSITIVE: Anti Nuclear Antibody(ANA): NEGATIVE

## 2014-03-18 LAB — CBC WITH DIFFERENTIAL/PLATELET
Basophils Absolute: 0.1 10*3/uL (ref 0.0–0.2)
Basos: 1 %
Eos: 4 %
Eosinophils Absolute: 0.6 10*3/uL — ABNORMAL HIGH (ref 0.0–0.4)
HEMATOCRIT: 33.5 % — AB (ref 37.5–51.0)
Hemoglobin: 10.9 g/dL — ABNORMAL LOW (ref 12.6–17.7)
Immature Grans (Abs): 0 10*3/uL (ref 0.0–0.1)
Immature Granulocytes: 0 %
Lymphocytes Absolute: 2.9 10*3/uL (ref 0.7–3.1)
Lymphs: 23 %
MCH: 26.5 pg — ABNORMAL LOW (ref 26.6–33.0)
MCHC: 32.5 g/dL (ref 31.5–35.7)
MCV: 82 fL (ref 79–97)
MONOS ABS: 1.1 10*3/uL — AB (ref 0.1–0.9)
Monocytes: 8 %
NEUTROS PCT: 64 %
Neutrophils Absolute: 7.9 10*3/uL — ABNORMAL HIGH (ref 1.4–7.0)
Platelets: 302 10*3/uL (ref 150–379)
RBC: 4.11 x10E6/uL — ABNORMAL LOW (ref 4.14–5.80)
RDW: 17.4 % — AB (ref 12.3–15.4)
WBC: 12.7 10*3/uL — AB (ref 3.4–10.8)

## 2014-03-18 LAB — TSH+FREE T4
FREE T4: 1.4 ng/dL (ref 0.82–1.77)
TSH: 1.15 u[IU]/mL (ref 0.450–4.500)

## 2014-03-21 DIAGNOSIS — G4733 Obstructive sleep apnea (adult) (pediatric): Secondary | ICD-10-CM | POA: Diagnosis not present

## 2014-03-24 NOTE — Progress Notes (Signed)
Quick Note:  I called and relayed the results of labs to Despina Pole, daughter. Pt to follow up with pcp re: renal function, anemia. Dr. Tamsen Roers - pcp. Will fax to him. She verbalized understanding. I relayed about results and note on mychart. She will look up. ______

## 2014-03-28 DIAGNOSIS — M79606 Pain in leg, unspecified: Secondary | ICD-10-CM | POA: Diagnosis not present

## 2014-03-28 DIAGNOSIS — M961 Postlaminectomy syndrome, not elsewhere classified: Secondary | ICD-10-CM | POA: Diagnosis not present

## 2014-03-28 DIAGNOSIS — M4806 Spinal stenosis, lumbar region: Secondary | ICD-10-CM | POA: Diagnosis not present

## 2014-03-28 DIAGNOSIS — Z79899 Other long term (current) drug therapy: Secondary | ICD-10-CM | POA: Diagnosis not present

## 2014-03-28 DIAGNOSIS — G894 Chronic pain syndrome: Secondary | ICD-10-CM | POA: Diagnosis not present

## 2014-03-28 DIAGNOSIS — G629 Polyneuropathy, unspecified: Secondary | ICD-10-CM | POA: Diagnosis not present

## 2014-04-05 DIAGNOSIS — M545 Low back pain: Secondary | ICD-10-CM | POA: Diagnosis not present

## 2014-04-05 DIAGNOSIS — G441 Vascular headache, not elsewhere classified: Secondary | ICD-10-CM | POA: Diagnosis not present

## 2014-04-05 DIAGNOSIS — E782 Mixed hyperlipidemia: Secondary | ICD-10-CM | POA: Diagnosis not present

## 2014-04-14 ENCOUNTER — Other Ambulatory Visit: Payer: Self-pay

## 2014-04-19 ENCOUNTER — Ambulatory Visit (INDEPENDENT_AMBULATORY_CARE_PROVIDER_SITE_OTHER): Payer: Self-pay | Admitting: Neurology

## 2014-04-19 ENCOUNTER — Telehealth: Payer: Self-pay | Admitting: Neurology

## 2014-04-19 ENCOUNTER — Ambulatory Visit (INDEPENDENT_AMBULATORY_CARE_PROVIDER_SITE_OTHER): Payer: 59 | Admitting: Neurology

## 2014-04-19 DIAGNOSIS — R253 Fasciculation: Secondary | ICD-10-CM | POA: Diagnosis not present

## 2014-04-19 DIAGNOSIS — G609 Hereditary and idiopathic neuropathy, unspecified: Secondary | ICD-10-CM

## 2014-04-19 DIAGNOSIS — G6181 Chronic inflammatory demyelinating polyneuritis: Secondary | ICD-10-CM

## 2014-04-19 DIAGNOSIS — R270 Ataxia, unspecified: Secondary | ICD-10-CM

## 2014-04-19 DIAGNOSIS — G6289 Other specified polyneuropathies: Secondary | ICD-10-CM | POA: Diagnosis not present

## 2014-04-19 DIAGNOSIS — Z0289 Encounter for other administrative examinations: Secondary | ICD-10-CM

## 2014-04-19 DIAGNOSIS — M6250 Muscle wasting and atrophy, not elsewhere classified, unspecified site: Secondary | ICD-10-CM | POA: Diagnosis not present

## 2014-04-19 DIAGNOSIS — M6258 Muscle wasting and atrophy, not elsewhere classified, other site: Secondary | ICD-10-CM

## 2014-04-19 DIAGNOSIS — G4733 Obstructive sleep apnea (adult) (pediatric): Secondary | ICD-10-CM | POA: Diagnosis not present

## 2014-04-19 NOTE — Procedures (Signed)
   NCS (NERVE CONDUCTION STUDY) WITH EMG (ELECTROMYOGRAPHY) REPORT   STUDY DATE: April 19 2014 PATIENT NAME: [name] DOB: 07/24/1940 MRN: [id number]    TECHNOLOGIST: Towana Badger ELECTROMYOGRAPHER: Marcial Pacas M.D.  CLINICAL INFORMATION: 74 year-old gentleman presenting with chronic low back pain, previous multiple lumbar decompression surgery, more than 20 years history of bilateral lower extremity ascending paresthesia, gait difficulty due to low back pain, bilateral knee replacement, rapid worsening of gait difficulty since October 2016,increase the bilateral distal leg paresthesia, and weakness.  On examination, he has moderate bilateral ankle dorsiflexion, plantar flexion weakness, left worse than right. Length dependent decreased to pinprick, vibratory sensation to bilateral knee level, absent bilateral toe proprioception. Decreased vibratory sensation at bilateral fingertips. He is areflexia,  FINDINGS: NERVE CONDUCTION STUDY: Bilateral peroneal, sural, median, ulnar, radial sensory responses were absent.  Bilateral peroneal, tibial motor responses were absent.  Left radial motor response was absent. Bilateral ulnar motor responses showed normal distal latency, preserved C map amplitude, moderate C prolonged F-wave latency, within normal range conduction velocity. There was no evidence of temporal dispersion. Bilateral median motor responses showed mildly prolonged distal latency, normal C map amplitude at distal stimulation side, significant amplitude drop at proximal stimulation side, especially at right median motor response, there was evidence of temporal dispersion, more than 50% C map amplitude drop, significantly prolonged right median F-wave latency.  NEEDLE ELECTROMYOGRAPHY: Selected needle examination was performed at bilateral lower extremity muscles, bilateral lumbosacral paraspinal muscles, left upper extremity muscle, left cervical paraspinal muscles.  Bilateral  tibialis anterior, tibialis posterior, vastus lateralis: Increased insertional activity, no spontaneous activity, enlarged complex motor unit potential, with decreased recruitment patterns.  There was increased insertional activity at bilateral lumbosacral paraspinals, bilateral L4, 5, S1, motor unit potential was enlarged, complex along midline lumbar scar,with decreased recruitment patterns.  Left first dorsal interossei, pronator teres, biceps, triceps, deltoid: Normal insertional activity, no spontaneous activity, normal morphology motor unit potential, with mildly decreased recruitment patterns.  There was no spontaneous activity at left cervical paraspinal muscles, left C5, C6, C7.  IMPRESSION:   This is a marked abnormal study. There is electrodiagnostic evidence of peripheral neuropathy, with evidence of acquired demyelinating features, consistent with chronic inflammatory myelinating polyneuropathy. Further evaluation such as CSF study is suggested.   INTERPRETING PHYSICIAN:   Marcial Pacas M.D. Ph.D. [facility] [address] [place],  [address] [phone]

## 2014-04-19 NOTE — Progress Notes (Signed)
Electrodiagnostic study today showed features consistent with chronic inflammatory demyelinating polyneuropathy

## 2014-04-20 ENCOUNTER — Ambulatory Visit: Payer: 59 | Admitting: Neurology

## 2014-04-22 NOTE — Telephone Encounter (Signed)
appt in March 10th with Dr. Krista Blue

## 2014-04-24 ENCOUNTER — Ambulatory Visit
Admission: RE | Admit: 2014-04-24 | Discharge: 2014-04-24 | Disposition: A | Discharge status: Home / Self Care | Payer: Medicare Other | Source: Ambulatory Visit | Attending: Neurology | Admitting: Neurology

## 2014-04-24 DIAGNOSIS — R253 Fasciculation: Secondary | ICD-10-CM

## 2014-04-24 DIAGNOSIS — M6258 Muscle wasting and atrophy, not elsewhere classified, other site: Secondary | ICD-10-CM

## 2014-04-24 DIAGNOSIS — R270 Ataxia, unspecified: Secondary | ICD-10-CM

## 2014-04-24 DIAGNOSIS — G609 Hereditary and idiopathic neuropathy, unspecified: Secondary | ICD-10-CM

## 2014-04-25 ENCOUNTER — Telehealth: Payer: Self-pay | Admitting: Neurology

## 2014-04-25 DIAGNOSIS — G629 Polyneuropathy, unspecified: Secondary | ICD-10-CM | POA: Diagnosis not present

## 2014-04-25 DIAGNOSIS — G894 Chronic pain syndrome: Secondary | ICD-10-CM | POA: Diagnosis not present

## 2014-04-25 DIAGNOSIS — M961 Postlaminectomy syndrome, not elsewhere classified: Secondary | ICD-10-CM | POA: Diagnosis not present

## 2014-04-25 DIAGNOSIS — M79606 Pain in leg, unspecified: Secondary | ICD-10-CM | POA: Diagnosis not present

## 2014-04-25 DIAGNOSIS — Z79899 Other long term (current) drug therapy: Secondary | ICD-10-CM | POA: Diagnosis not present

## 2014-04-25 NOTE — Telephone Encounter (Signed)
Patient's daughter, Judeen Hammans @ [phone] stated patient was not able to have MRI on 04/24/14 due to severe Claustrophobia.  Judeen Hammans stated [place] showed MRI W/O contrast and was confused as to last OV Dr. Beacher May requested With and W/O Contrast.   Questioning if MRI need to be rescheduled before  appointment with Dr. Krista Blue on 04/28/14 @ 8:45.  Also stated if patient needs to reschedule MRI he will need medication to clam him down and an open MRI.  Please call and advise.

## 2014-04-25 NOTE — Telephone Encounter (Signed)
[name]: Please let patient's know, will hold of MRI of the brain, keep follow-up appointment, will address his questions then

## 2014-04-25 NOTE — Telephone Encounter (Signed)
Advised to keep appt

## 2014-04-26 DIAGNOSIS — M7050 Other bursitis of knee, unspecified knee: Secondary | ICD-10-CM | POA: Diagnosis not present

## 2014-04-26 DIAGNOSIS — M720 Palmar fascial fibromatosis [Dupuytren]: Secondary | ICD-10-CM | POA: Diagnosis not present

## 2014-04-26 DIAGNOSIS — R230 Cyanosis: Secondary | ICD-10-CM | POA: Diagnosis not present

## 2014-04-26 DIAGNOSIS — E782 Mixed hyperlipidemia: Secondary | ICD-10-CM | POA: Diagnosis not present

## 2014-04-26 DIAGNOSIS — E538 Deficiency of other specified B group vitamins: Secondary | ICD-10-CM | POA: Diagnosis not present

## 2014-04-28 ENCOUNTER — Ambulatory Visit (INDEPENDENT_AMBULATORY_CARE_PROVIDER_SITE_OTHER): Payer: 59 | Admitting: Neurology

## 2014-04-28 ENCOUNTER — Encounter: Payer: Self-pay | Admitting: Neurology

## 2014-04-28 VITALS — BP 128/62 | HR 62

## 2014-04-28 DIAGNOSIS — M21371 Foot drop, right foot: Secondary | ICD-10-CM | POA: Diagnosis not present

## 2014-04-28 DIAGNOSIS — R269 Unspecified abnormalities of gait and mobility: Secondary | ICD-10-CM

## 2014-04-28 DIAGNOSIS — G6181 Chronic inflammatory demyelinating polyneuritis: Secondary | ICD-10-CM | POA: Diagnosis not present

## 2014-04-28 DIAGNOSIS — M21372 Foot drop, left foot: Secondary | ICD-10-CM

## 2014-04-28 NOTE — Progress Notes (Signed)
PATIENT: [name] DOB: [birth year]  HISTORICAL  SHAWNE ESKELSON 74YI RH is referred by his primary care physician Dr. Rex Kras and Dr. Brett Fairy or evaluation of gait difficulty  He had a history of chronic artery disease, chronic atrial fibrillation, taking chronic anticoagulation, hypertension, multiple low back decompression surgery in the past, chronic low back pain, over the years, has used high dose of Tylenol, NSAIDs, chronic renal  insufficiency, recent creatinine 1.8 5, he also had right knee replacement at end of 9485, complicated by infection, has to have second right knee replacement, he has been wheelchair bound since 2012.  He reported more than 20 years history of ascending paresthesia, distal weakness, initial was toe numbness, gradually spreading to involving both feet, ascending to bilateral knee level, he has significant gait difficulty prior to knee surgery, now he denied significant knee pain, but has increased bilateral feet numbness, weakness, frequent falling, most recent fall was 2 days ago, with left shoulder pain. Since 2015, he also noticed bilateral fingertips paresthesia, bilateral hands clumsiness, He continue has chronic low back pain, radiating to bilateral lower extremity, left worse than right, urinary urgency.   EMG/NCS In March first 2016, there is electrodiagnostic evidence of peripheral neuropathy, with evidence of acquired demyelinating features, consistent with chronic inflammatory myelinating polyneuropathy. Further evaluation such as CSF study is suggested.   REVIEW OF SYSTEMS: Full 14 system review of systems performed and notable only for restless leg, back pain, walking difficulty, numbness, weakness.  ALLERGIES: Allergies  Allergen Reactions  . Acetaminophen Other (See Comments)    LIVER INFLAMMATION IN HIGH DOSES  . Lasix [Furosemide]     Could not function / could not get out of bed.    HOME MEDICATIONS: Current Outpatient  Prescriptions  Medication Sig Dispense Refill  . apixaban (ELIQUIS) 5 MG TABS tablet Take 5 mg by mouth 2 (two) times daily.    Marland Kitchen atorvastatin (LIPITOR) 10 MG tablet Take 10 mg by mouth daily.    . carvedilol (COREG) 6.25 MG tablet Take 6.25 mg by mouth 2 (two) times daily with a meal.    . escitalopram (LEXAPRO) 10 MG tablet Take 10 mg by mouth daily.    Marland Kitchen gabapentin (NEURONTIN) 100 MG capsule Take 100 mg by mouth 3 (three) times daily.    Marland Kitchen HYDROcodone-acetaminophen (NORCO/VICODIN) 5-325 MG per tablet Take 2 tablets by mouth every 6 (six) hours as needed for moderate pain. (Patient taking differently: Take 2 tablets by mouth every 6 (six) hours. ) 30 tablet 0  . omeprazole-sodium bicarbonate (ZEGERID) 40-1100 MG per capsule Take 1 capsule by mouth daily before breakfast. 90 capsule 3  . [DISCONTINUED] zolpidem (AMBIEN) 5 MG tablet Take 1 tablet (5 mg total) by mouth at bedtime as needed for sleep. 20 tablet 0   No current facility-administered medications for this visit.    PAST MEDICAL HISTORY: Past Medical History  Diagnosis Date  . Hypertension   . Hyperlipidemia   . Preoperative cardiovascular examination   . Spinal stenosis, lumbar   . CAS (cerebral atherosclerosis)   . Erectile dysfunction   . Preventative health care   . Benign prostatic hypertrophy with urinary obstruction   . Family history of early CAD     male 1st degree relative <50  . Peptic ulcer disease   . Low back pain   . Superficial spreading melanoma   . Sleep apnea, obstructive     CPAP-does not use  . HA (headache)  sinus headaches  . Arthritis     knees  . Myocardial infarction 1997    Hx MI 1997 and 1998  . Anxiety   . Pneumonia 2005  . Anemia   . Depression     takes Zoloft  . Neuropathy, autonomic, idiopathic peripheral, other   . Blood transfusion 1 yr. ago    jerking,fever-after blood transfusion  . Blood transfusion     given wrong blood  . Coronary artery disease 1998    cardiac  stent/ Clearance Dr Arnoldo Morale and Einar Gip with note on chart  . Dysrhythmia     hx of atrial fib per ov note of 12/12   . Bradycardia     hx of bradycardia in past per ov note of 12/12  . GERD (gastroesophageal reflux disease)     PAST SURGICAL HISTORY: Past Surgical History  Procedure Laterality Date  . Esophagogastroduodenoscopy  2004  . Colonoscopy  2004  . Lumbar laminectomy    . Lumbar fusion    . Knee arthroscopy      left  . Shoulder arthroscopy      right  . Knee arthroscopy      right  . Decompression of the median nerve      left wrist and hand  . Revision of tkr  2011    on left  . Coronary stent placement    . Back surgery  2005-last    lumbar x2  . Excisional total knee arthroplasty  12/25/2010    Procedure: EXCISIONAL TOTAL KNEE ARTHROPLASTY;  Surgeon: Mauri Pole;  Location: [facility];  Service: Orthopedics;  Laterality: Right;  repeat irrigation   and debridement, removal and reinsertation of spacer block  . Joint replacement  2011    left knee x 2 ; 08/2010-right knee-infected  . I&d extremity  03/25/2011    Procedure: IRRIGATION AND DEBRIDEMENT EXTREMITY;  Surgeon: Mauri Pole, MD;  Location: [facility];  Service: Orthopedics;  Laterality: Right;  . Coronary angioplasty  [place]   . Total knee revision  05/13/2011    Procedure: TOTAL KNEE REVISION;  Surgeon: Mauri Pole, MD;  Location: [facility];  Service: Orthopedics;  Laterality: Right;  Reimplantation    FAMILY HISTORY: Family History  Problem Relation Age of Onset  . Heart disease Father   . Heart disease Mother   . Diabetes Sister   . Heart disease Brother   . Breast cancer Sister   . Breast cancer Sister     SOCIAL HISTORY:  History   Social History  . Marital Status: Married    Spouse Name: Vaughan Basta  . Number of Children: 4  . Years of Education: HS   Occupational History  . diesel repair     part time  . worked at [employer]   . 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  Smoking status: Former Smoker -- 1.00 packs/day for 15 years    Types: Cigars    Quit date: 12/23/2009  . Smokeless tobacco: Never Used     Comment: smoked a pipe for 6-7 y ears  . Alcohol Use: No  . Drug Use: No  . Sexual Activity: Not on file   Other Topics Concern  . Not on file   Social History Narrative   Caffeine 2 cups daily avg.      PHYSICAL EXAM   Filed Vitals:   04/28/14 0900  BP: 128/62  Pulse: 62  Not recorded      Cannot calculate BMI with a height equal to zero.  PHYSICAL EXAMNIATION:  Gen: NAD, conversant, well nourised, obese, well groomed                     Cardiovascular: Regular rate rhythm, no peripheral edema, warm, nontender. Eyes: Conjunctivae clear without exudates or hemorrhage Neck: Supple, no carotid bruise. Pulmonary: Clear to auscultation bilaterally   NEUROLOGICAL EXAM:  MENTAL STATUS: Speech:    Speech is normal; fluent and spontaneous with normal comprehension.  Cognition:    The patient is oriented to person, place, and time;     recent and remote memory intact;     language fluent;     normal attention, concentration,     fund of knowledge.  CRANIAL NERVES: CN II: Visual fields are full to confrontation. Fundoscopic exam is normal with sharp discs and no vascular changes. Venous pulsations are present bilaterally. Pupils are 4 mm and briskly reactive to light. Visual acuity is 20/20 bilaterally. CN III, IV, VI: extraocular movement are normal. No ptosis. CN V: Facial sensation is intact to pinprick in all 3 divisions bilaterally. Corneal responses are intact.  CN VII: Face is symmetric with normal eye closure and smile. CN VIII: Hearing is normal to rubbing fingers CN IX, X: Palate elevates symmetrically. Phonation is normal. CN XI: Head turning and shoulder shrug are intact CN XII: Tongue is midline with normal movements and no atrophy.  MOTOR: There is no pronator drift of out-stretched arms. Muscle bulk and tone are  normal. Muscle strength is normal.   Shoulder abduction Shoulder external rotation Elbow flexion Elbow extension Wrist flexion Wrist extension Finger abduction Hip flexion Knee flexion Knee extension Ankle dorsi flexion Ankle plantar flexion  R 5 5 5 5 5 5 4 5 5 5 2 3   L 5 5 5 5 5 5 4 5 5 5 2 3     REFLEXES: Absent Plantar responses are flexor.  SENSORY: Length dependent decreased light touch, pinprick to bilateral knee level, to bilateral wrist crease level,  absent bilateral toe vibratory and positional sensation, and decreased vibration sense at finger tips.  COORDINATION: Rapid alternating movements and fine finger movements are intact. There is no dysmetria on finger-to-nose and heel-knee-shin. There are no abnormal or extraneous movements.   GAIT/STANCE: Need assistant to get up from seated position, bilateral foot drop, cautious, wide-based gait,   DIAGNOSTIC DATA (LABS, IMAGING, TESTING) - I reviewed patient records, labs, notes, testing and imaging myself where available.  Lab Results  Component Value Date   WBC 12.7* 03/16/2014   HGB 10.9* 03/16/2014   HCT 33.5* 03/16/2014   MCV 82 03/16/2014   PLT 302 03/16/2014      Component Value Date/Time   NA 126* 03/16/2014 1101   NA 133* 01/18/2014 1633   K 5.8* 03/16/2014 1101   CL 89* 03/16/2014 1101   CO2 22 03/16/2014 1101   GLUCOSE 92 03/16/2014 1101   GLUCOSE 117* 01/18/2014 1633   BUN 40* 03/16/2014 1101   BUN 39* 01/18/2014 1633   CREATININE 1.85* 03/16/2014 1101   CREATININE 1.18 03/14/2013 1732   CALCIUM 9.5 03/16/2014 1101   PROT 6.9 03/16/2014 1101   PROT 6.7 01/18/2014 1633   ALBUMIN 3.0* 01/18/2014 1633   AST 14 03/16/2014 1101   ALT 11 03/16/2014 1101   ALKPHOS 90 03/16/2014 1101   BILITOT 0.3 03/16/2014 1101   GFRNONAA 35* 03/16/2014 1101   GFRAA 40*  03/16/2014 1101   Lab Results  Component Value Date   CHOL 114 03/28/2010   HDL 25.20* 03/28/2010   LDLDIRECT 59.1 03/28/2010   TRIG 201.0*  03/28/2010   CHOLHDL 5 03/28/2010   No results found for: HGBA1C Lab Results  Component Value Date   VITAMINB12 799 03/14/2013   Lab Results  Component Value Date   TSH 1.150 03/16/2014    ASSESSMENT AND PLAN  TRAVAUGHN VUE is a 74 y.o. male with past medical history of multiple lumbar decompression surgery, right knee replacement, complicated by infection, has to have second right knee replacement, wheelchair bound since and of 2012, more than 20 years history of gradual onset ascending paresthesia, distal weakness, worsening gait difficulty, electrodiagnostic study confirmed CIDP, he also has a history of chronic kidney disease, recent creatinine 1.85  1. CIDP, complete evaluation with fluoroscopy guided lumbar puncture, 2. If diagnosis is confirmed, treatment option would be steroid, immunosuppressive treatment, IVIG, but is really limited because of his multiple comorbidities, chronic renal insufficiency. 3, home physical therapy 4 return to clinic in 2 months 5. I also counseled with him to stop driving because of the distal weakness, sensory loss.   Marcial Pacas, M.D. Ph.D.  [facility] [address] [place], [place] [address] Ph: [phone] Fax: [phone]

## 2014-05-10 ENCOUNTER — Other Ambulatory Visit: Payer: Self-pay | Admitting: Neurology

## 2014-05-10 ENCOUNTER — Ambulatory Visit
Admission: RE | Admit: 2014-05-10 | Discharge: 2014-05-10 | Disposition: A | Discharge status: Home / Self Care | Payer: Medicare Other | Source: Ambulatory Visit | Attending: Neurology | Admitting: Neurology

## 2014-05-10 DIAGNOSIS — M21372 Foot drop, left foot: Secondary | ICD-10-CM

## 2014-05-10 DIAGNOSIS — G6181 Chronic inflammatory demyelinating polyneuritis: Secondary | ICD-10-CM

## 2014-05-10 DIAGNOSIS — M21371 Foot drop, right foot: Secondary | ICD-10-CM

## 2014-05-10 DIAGNOSIS — R269 Unspecified abnormalities of gait and mobility: Secondary | ICD-10-CM

## 2014-05-10 LAB — CSF CELL COUNT WITH DIFFERENTIAL
RBC Count, CSF: 12 cu mm — ABNORMAL HIGH
TUBE #: 3
WBC, CSF: 2 cu mm (ref 0–5)

## 2014-05-10 LAB — GRAM STAIN: Gram Stain: NONE SEEN

## 2014-05-10 LAB — GLUCOSE, CSF: Glucose, CSF: 61 mg/dL (ref 43–76)

## 2014-05-10 LAB — PROTEIN, CSF: Total Protein, CSF: 101 mg/dL — ABNORMAL HIGH (ref 15–45)

## 2014-05-10 NOTE — Discharge Instructions (Signed)
Lumbar Puncture Discharge Instructions ° °1. Go home and rest quietly for the next 24 hours.  It is important to lie flat for the next 24 hours.  Get up only to go to the restroom.  You may lie in the bed or on a couch on your back, your stomach, your left side or your right side.  You may have one pillow under your head.  You may have pillows between your knees while you are on your side or under your knees while you are on your back. ° °2. DO NOT drive today.  Recline the seat as far back as it will go, while still wearing your seat belt, on the way home. ° °3. You may get up to go to the bathroom as needed.  You may sit up for 10 minutes to eat.  You may resume your normal diet and medications unless otherwise indicated.  Drink lots of extra fluids today and tomorrow. ° °4. The incidence of headache, nausea, or vomiting is about 5% (one in 20 patients).  If you develop a headache, lie flat and drink plenty of fluids until the headache goes away.  Caffeinated beverages may be helpful.  If you develop severe nausea and vomiting or a headache that does not go away with flat bed rest, call the physician who sent you here.  ° °5. You may resume normal activities after your 24 hours of bed rest is over; however, do not exert yourself strongly or do any heavy lifting tomorrow. ° °6. Call your physician for a follow-up appointment.  ° °7. If you have any questions  after you arrive home, please call [phone]. ° °Discharge instructions have been explained to the patient.  The patient, or the person responsible for the patient, fully understands these instructions. ° ° ° °MAY RESUME ELIQUIS TODAY ° °

## 2014-05-11 LAB — VDRL, CSF: VDRL Quant, CSF: NONREACTIVE

## 2014-05-23 DIAGNOSIS — M79606 Pain in leg, unspecified: Secondary | ICD-10-CM | POA: Diagnosis not present

## 2014-05-23 DIAGNOSIS — G629 Polyneuropathy, unspecified: Secondary | ICD-10-CM | POA: Diagnosis not present

## 2014-05-23 DIAGNOSIS — M4806 Spinal stenosis, lumbar region: Secondary | ICD-10-CM | POA: Diagnosis not present

## 2014-05-23 DIAGNOSIS — Z79899 Other long term (current) drug therapy: Secondary | ICD-10-CM | POA: Diagnosis not present

## 2014-05-23 DIAGNOSIS — M47817 Spondylosis without myelopathy or radiculopathy, lumbosacral region: Secondary | ICD-10-CM | POA: Diagnosis not present

## 2014-05-23 DIAGNOSIS — G894 Chronic pain syndrome: Secondary | ICD-10-CM | POA: Diagnosis not present

## 2014-05-23 DIAGNOSIS — M961 Postlaminectomy syndrome, not elsewhere classified: Secondary | ICD-10-CM | POA: Diagnosis not present

## 2014-05-25 DIAGNOSIS — I200 Unstable angina: Secondary | ICD-10-CM | POA: Diagnosis not present

## 2014-05-25 DIAGNOSIS — E538 Deficiency of other specified B group vitamins: Secondary | ICD-10-CM | POA: Diagnosis not present

## 2014-05-25 DIAGNOSIS — L930 Discoid lupus erythematosus: Secondary | ICD-10-CM | POA: Diagnosis not present

## 2014-06-03 ENCOUNTER — Telehealth: Payer: Self-pay | Admitting: Neurology

## 2014-06-03 NOTE — Telephone Encounter (Signed)
Left message to return my call.  He will need a 30 minute appt next week to discuss results.

## 2014-06-03 NOTE — Telephone Encounter (Signed)
[name]: Please call  Patient, csf showed significantly elevated TP 101, please move up his appt next week, 30 minutes slot.

## 2014-06-06 NOTE — Telephone Encounter (Signed)
Pt coming in 4/19 with his daughter.

## 2014-06-07 ENCOUNTER — Encounter: Payer: Self-pay | Admitting: Neurology

## 2014-06-07 ENCOUNTER — Telehealth: Payer: Self-pay | Admitting: Neurology

## 2014-06-07 ENCOUNTER — Ambulatory Visit (INDEPENDENT_AMBULATORY_CARE_PROVIDER_SITE_OTHER): Payer: Medicare Other | Admitting: Neurology

## 2014-06-07 VITALS — BP 112/61 | HR 60

## 2014-06-07 DIAGNOSIS — M21372 Foot drop, left foot: Secondary | ICD-10-CM

## 2014-06-07 DIAGNOSIS — M21371 Foot drop, right foot: Secondary | ICD-10-CM

## 2014-06-07 DIAGNOSIS — R269 Unspecified abnormalities of gait and mobility: Secondary | ICD-10-CM | POA: Diagnosis not present

## 2014-06-07 DIAGNOSIS — G6181 Chronic inflammatory demyelinating polyneuritis: Secondary | ICD-10-CM | POA: Diagnosis not present

## 2014-06-07 MED ORDER — PREDNISONE 10 MG PO TABS: ORAL_TABLET | ORAL | Status: DC

## 2014-06-07 MED ORDER — AZATHIOPRINE 50 MG PO TABS: ORAL_TABLET | ORAL | Status: DC

## 2014-06-07 NOTE — Progress Notes (Signed)
PATIENT: [name] DOB: 1941-01-25  HISTORICAL  [name] 51IR RH is referred by his primary care physician Dr. Rex [name] and Dr. Brett [name] or evaluation of gait difficulty  He had a history of coronary artery disease,stent placement, chronic atrial fibrillation, taking chronic anticoagulation Eliquis, hypertension, multiple low back decompression surgery in the past, chronic low back pain, over the years, has used high dose of Tylenol, NSAIDs, chronic renal  insufficiency, recent creatinine 1.8 5, he also had right knee replacement at end of 4431, complicated by infection, has to have second right knee replacement, he has been wheelchair bound since 2012.  He reported more than 20 years history of ascending paresthesia, distal weakness, initial was toe numbness, gradually spreading to involving both feet, ascending to bilateral knee level, he has significant gait difficulty prior to knee surgery, now he denied significant knee pain, but has increased bilateral feet numbness, weakness, frequent falling, most recent fall was 2 days ago, with left shoulder pain. Since 2015, he also noticed bilateral fingertips paresthesia, bilateral hands clumsiness, He continue has chronic low back pain, radiating to bilateral lower extremity, left worse than right, urinary urgency.   EMG/NCS In March first 2016, there is electrodiagnostic evidence of peripheral neuropathy, with evidence of acquired demyelinating features, consistent with chronic inflammatory myelinating polyneuropathy. Further evaluation such as CSF study is suggested.  UPDATE April 19ht 2016: Spinal fluid testing showed total protein 101, WBC 0, RBC 12, glucose 61 above findings, in combination with EMG nerve conduction study, conformed diagnosis of CIDP, He has increased gait difficulty, distal weakness.  REVIEW OF SYSTEMS: Full 14 system review of systems performed and notable only for restless leg, back pain, walking difficulty,  numbness, weakness.  ALLERGIES: Allergies  Allergen Reactions  . Acetaminophen Other (See Comments)    LIVER INFLAMMATION IN HIGH DOSES  . Lasix [Furosemide] Other (See Comments)    Could not function / could not get out of bed.    HOME MEDICATIONS: Current Outpatient Prescriptions  Medication Sig Dispense Refill  . apixaban (ELIQUIS) 5 MG TABS tablet Take 5 mg by mouth 2 (two) times daily.    Marland Kitchen atorvastatin (LIPITOR) 10 MG tablet Take 10 mg by mouth daily.    . carvedilol (COREG) 6.25 MG tablet Take 6.25 mg by mouth 2 (two) times daily with a meal.    . escitalopram (LEXAPRO) 10 MG tablet Take 10 mg by mouth daily.    . furosemide (LASIX) 40 MG tablet     . gabapentin (NEURONTIN) 100 MG capsule Take 100 mg by mouth 3 (three) times daily.    Marland Kitchen HYDROcodone-acetaminophen (NORCO) 10-325 MG per tablet Take 1 tablet by mouth. Take one tab 4 times daily.  May take 1 additional tablet if needed.    Marland Kitchen NITROSTAT 0.4 MG SL tablet     . OLANZapine (ZYPREXA) 5 MG tablet     . omeprazole-sodium bicarbonate (ZEGERID) 40-1100 MG per capsule Take 1 capsule by mouth daily before breakfast. 90 capsule 3  . spironolactone-hydrochlorothiazide (ALDACTAZIDE) 25-25 MG per tablet     . [DISCONTINUED] zolpidem (AMBIEN) 5 MG tablet Take 1 tablet (5 mg total) by mouth at bedtime as needed for sleep. 20 tablet 0   No current facility-administered medications for this visit.    PAST MEDICAL HISTORY: Past Medical History  Diagnosis Date  . Hypertension   . Hyperlipidemia   . Preoperative cardiovascular examination   . Spinal stenosis, lumbar   . CAS (cerebral atherosclerosis)   .  Erectile dysfunction   . Preventative health care   . Benign prostatic hypertrophy with urinary obstruction   . Family history of early CAD     male 1st degree relative <50  . Peptic ulcer disease   . Low back pain   . Superficial spreading melanoma   . Sleep apnea, obstructive     CPAP-does not use  . HA (headache)      sinus headaches  . Arthritis     knees  . Myocardial infarction 1997    Hx MI 1997 and 1998  . Anxiety   . Pneumonia 2005  . Anemia   . Depression     takes Zoloft  . Neuropathy, autonomic, idiopathic peripheral, other   . Blood transfusion 1 yr. ago    jerking,fever-after blood transfusion  . Blood transfusion     given wrong blood  . Coronary artery disease 1998    cardiac stent/ Clearance Dr Arnoldo [name] and [name] with note on chart  . Dysrhythmia     hx of atrial fib per ov note of 12/12   . Bradycardia     hx of bradycardia in past per ov note of 12/12  . GERD (gastroesophageal reflux disease)     PAST SURGICAL HISTORY: Past Surgical History  Procedure Laterality Date  . Esophagogastroduodenoscopy  2004  . Colonoscopy  2004  . Lumbar laminectomy    . Lumbar fusion    . Knee arthroscopy      left  . Shoulder arthroscopy      right  . Knee arthroscopy      right  . Decompression of the median nerve      left wrist and hand  . Revision of tkr  2011    on left  . Coronary stent placement    . Back surgery  2005-last    lumbar x2  . Excisional total knee arthroplasty  12/25/2010    Procedure: EXCISIONAL TOTAL KNEE ARTHROPLASTY;  Surgeon: Mauri Pole;  Location: [facility];  Service: Orthopedics;  Laterality: Right;  repeat irrigation   and debridement, removal and reinsertation of spacer block  . Joint replacement  2011    left knee x 2 ; 08/2010-right knee-infected  . I&d extremity  03/25/2011    Procedure: IRRIGATION AND DEBRIDEMENT EXTREMITY;  Surgeon: Mauri Pole, MD;  Location: [facility];  Service: Orthopedics;  Laterality: Right;  . Coronary angioplasty  [place]   . Total knee revision  05/13/2011    Procedure: TOTAL KNEE REVISION;  Surgeon: Mauri Pole, MD;  Location: [facility];  Service: Orthopedics;  Laterality: Right;  Reimplantation    FAMILY HISTORY: Family History  Problem Relation Age of Onset  . Heart disease Father   . Heart disease Mother    . Diabetes Sister   . Heart disease Brother   . Breast cancer Sister   . Breast cancer Sister     SOCIAL HISTORY:  History   Social History  . Marital Status: Married    Spouse Name: [name]  . Number of Children: 4  . Years of Education: HS   Occupational History  . diesel repair     part time  . worked at [employer]   . 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 Smoking status: Former Smoker -- 1.00 packs/day for 15 years    Types: Cigars    Quit date: 12/23/2009  . Smokeless tobacco: Never Used  Comment: smoked a pipe for 6-7 y ears  . Alcohol Use: No  . Drug Use: No  . Sexual Activity: Not on file   Other Topics Concern  . Not on file   Social History Narrative   Caffeine 2 cups daily avg.      PHYSICAL EXAM   Filed Vitals:   06/07/14 1037  BP: 112/61  Pulse: 60    Not recorded      There is no weight on file to calculate BMI.  PHYSICAL EXAMNIATION:  Gen: NAD, conversant, well nourised, obese, well groomed                     Cardiovascular: Regular rate rhythm, no peripheral edema, warm, nontender. Eyes: Conjunctivae clear without exudates or hemorrhage Neck: Supple, no carotid bruise. Pulmonary: Clear to auscultation bilaterally   NEUROLOGICAL EXAM:  MENTAL STATUS: Speech:    Speech is normal; fluent and spontaneous with normal comprehension.  Cognition:    The patient is oriented to person, place, and time;     recent and remote memory intact;     language fluent;     normal attention, concentration,     fund of knowledge.  CRANIAL NERVES: CN II: Visual fields are full to confrontation. Fundoscopic exam is normal with sharp discs and no vascular changes. Venous pulsations are present bilaterally. Pupils are 4 mm and briskly reactive to light. Visual acuity is 20/20 bilaterally. CN III, IV, VI: extraocular movement are normal. No ptosis. CN V: Facial sensation is intact to pinprick in all 3 divisions bilaterally. Corneal responses  are intact.  CN VII: Face is symmetric with normal eye closure and smile. CN VIII: Hearing is normal to rubbing fingers CN IX, X: Palate elevates symmetrically. Phonation is normal. CN XI: Head turning and shoulder shrug are intact CN XII: Tongue is midline with normal movements and no atrophy.  MOTOR: Muscle bulk and tone are normal.   Shoulder abduction Shoulder external rotation Elbow flexion Elbow extension Wrist flexion Wrist extension Finger abduction Hip flexion Knee flexion Knee extension Ankle dorsi flexion Ankle plantar flexion  R 5 5 5 5 5 5 4 5 5 5 2 3   L 5 5 5 5 5 5 4 5 5 5 2 3     REFLEXES: Absent Plantar responses are flexor.  SENSORY: Length dependent decreased light touch, pinprick to bilateral knee level, to bilateral wrist crease level,  absent bilateral toe vibratory and preserved toe positional sensation  COORDINATION: Rapid alternating movements and fine finger movements are intact. There is no dysmetria on finger-to-nose and heel-knee-shin. There are no abnormal or extraneous movements.   GAIT/STANCE: Need assistant to get up from seated position, bilateral foot drop, cautious, wide-based gait,   DIAGNOSTIC DATA (LABS, IMAGING, TESTING) - I reviewed patient records, labs, notes, testing and imaging myself where available.  Lab Results  Component Value Date   WBC 12.7* 03/16/2014   HGB 10.9* 03/16/2014   HCT 33.5* 03/16/2014   MCV 82 03/16/2014   PLT 302 03/16/2014      Component Value Date/Time   NA 126* 03/16/2014 1101   NA 133* 01/18/2014 1633   K 5.8* 03/16/2014 1101   CL 89* 03/16/2014 1101   CO2 22 03/16/2014 1101   GLUCOSE 92 03/16/2014 1101   GLUCOSE 117* 01/18/2014 1633   BUN 40* 03/16/2014 1101   BUN 39* 01/18/2014 1633   CREATININE 1.85* 03/16/2014 1101   CREATININE 1.18 03/14/2013 1732   CALCIUM 9.5  03/16/2014 1101   PROT 6.9 03/16/2014 1101   PROT 6.7 01/18/2014 1633   ALBUMIN 3.0* 01/18/2014 1633   AST 14 03/16/2014 1101    ALT 11 03/16/2014 1101   ALKPHOS 90 03/16/2014 1101   BILITOT 0.3 03/16/2014 1101   GFRNONAA 35* 03/16/2014 1101   GFRAA 40* 03/16/2014 1101   Lab Results  Component Value Date   CHOL 114 03/28/2010   HDL 25.20* 03/28/2010   LDLDIRECT 59.1 03/28/2010   TRIG 201.0* 03/28/2010   CHOLHDL 5 03/28/2010   No results found for: HGBA1C Lab Results  Component Value Date   PYPPJKDT26 712 03/14/2013   Lab Results  Component Value Date   TSH 1.150 03/16/2014    ASSESSMENT AND PLAN  ADELAIDO NICKLAUS is a 74 y.o. male with past medical history of multiple lumbar decompression surgery, right knee replacement, complicated by infection, has to have second right knee replacement, wheelchair bound since and of 2012, more than 20 years history of gradual onset ascending paresthesia, distal weakness, worsening gait difficulty, electrodiagnostic study confirmed CIDP, he also has a history of chronic kidney disease, recent creatinine 1.85, CSF confirmed the diagnosis of CIPD,   I have dicussed the treatment options,   1. Prednisone 40mg  qday, tapering down gradually, 2. Imuran 100mg  bid 3. Refer to nephrologist, consider sucrose free IVIG at slow rate, well hydration before infusion   Marcial Pacas, M.D. Ph.D.  [facility] [address] [place], [place] [address] Ph: [phone] Fax: [phone]

## 2014-06-07 NOTE — Telephone Encounter (Signed)
I am sending over to [facility] I will get new authorizations.

## 2014-06-07 NOTE — Telephone Encounter (Signed)
Xanax, per office protocol, provided to patient at today's visit.  He will need MRI scans rescheduled.  Thanks.

## 2014-06-07 NOTE — Patient Instructions (Signed)
Prednisone 10 mg  4 tablets every morning after breakfast, for 2 weeks 3 tablets every morning after breakfast, until you come back to clinic in one month

## 2014-06-07 NOTE — Telephone Encounter (Signed)
MRI cervical, MRI lumbar older was place, he has attempted at [facility] imaging, but was not able to complete due to claustrophobia,  Please reschedule, he needs open MRI, Xanax prior to MRI

## 2014-06-07 NOTE — Telephone Encounter (Signed)
Could you please check on his home health referral?  [facility] does not accept his insurance.  The family has no preference of which company is used.  Thank you.

## 2014-06-10 DIAGNOSIS — R269 Unspecified abnormalities of gait and mobility: Secondary | ICD-10-CM | POA: Diagnosis not present

## 2014-06-13 DIAGNOSIS — G609 Hereditary and idiopathic neuropathy, unspecified: Secondary | ICD-10-CM | POA: Diagnosis not present

## 2014-06-13 DIAGNOSIS — R253 Fasciculation: Secondary | ICD-10-CM | POA: Diagnosis not present

## 2014-06-13 DIAGNOSIS — R220 Localized swelling, mass and lump, head: Secondary | ICD-10-CM | POA: Diagnosis not present

## 2014-06-13 DIAGNOSIS — R270 Ataxia, unspecified: Secondary | ICD-10-CM | POA: Diagnosis not present

## 2014-06-13 DIAGNOSIS — M6250 Muscle wasting and atrophy, not elsewhere classified, unspecified site: Secondary | ICD-10-CM | POA: Diagnosis not present

## 2014-06-15 DIAGNOSIS — R269 Unspecified abnormalities of gait and mobility: Secondary | ICD-10-CM | POA: Diagnosis not present

## 2014-06-17 DIAGNOSIS — R269 Unspecified abnormalities of gait and mobility: Secondary | ICD-10-CM | POA: Diagnosis not present

## 2014-06-21 DIAGNOSIS — G894 Chronic pain syndrome: Secondary | ICD-10-CM | POA: Diagnosis not present

## 2014-06-21 DIAGNOSIS — Z79899 Other long term (current) drug therapy: Secondary | ICD-10-CM | POA: Diagnosis not present

## 2014-06-21 DIAGNOSIS — M961 Postlaminectomy syndrome, not elsewhere classified: Secondary | ICD-10-CM | POA: Diagnosis not present

## 2014-06-21 DIAGNOSIS — M549 Dorsalgia, unspecified: Secondary | ICD-10-CM | POA: Diagnosis not present

## 2014-06-22 DIAGNOSIS — R269 Unspecified abnormalities of gait and mobility: Secondary | ICD-10-CM | POA: Diagnosis not present

## 2014-06-23 DIAGNOSIS — R51 Headache: Secondary | ICD-10-CM | POA: Diagnosis not present

## 2014-06-23 DIAGNOSIS — R7302 Impaired glucose tolerance (oral): Secondary | ICD-10-CM | POA: Diagnosis not present

## 2014-06-23 DIAGNOSIS — G894 Chronic pain syndrome: Secondary | ICD-10-CM | POA: Diagnosis not present

## 2014-06-23 DIAGNOSIS — E782 Mixed hyperlipidemia: Secondary | ICD-10-CM | POA: Diagnosis not present

## 2014-06-24 DIAGNOSIS — R269 Unspecified abnormalities of gait and mobility: Secondary | ICD-10-CM | POA: Diagnosis not present

## 2014-06-29 ENCOUNTER — Ambulatory Visit: Payer: 59 | Admitting: Neurology

## 2014-06-29 DIAGNOSIS — R269 Unspecified abnormalities of gait and mobility: Secondary | ICD-10-CM | POA: Diagnosis not present

## 2014-07-01 DIAGNOSIS — R269 Unspecified abnormalities of gait and mobility: Secondary | ICD-10-CM | POA: Diagnosis not present

## 2014-07-06 DIAGNOSIS — R269 Unspecified abnormalities of gait and mobility: Secondary | ICD-10-CM | POA: Diagnosis not present

## 2014-07-08 DIAGNOSIS — R269 Unspecified abnormalities of gait and mobility: Secondary | ICD-10-CM | POA: Diagnosis not present

## 2014-07-11 ENCOUNTER — Ambulatory Visit: Payer: Medicare Other | Admitting: Neurology

## 2014-07-11 ENCOUNTER — Telehealth: Payer: Self-pay

## 2014-07-11 NOTE — Telephone Encounter (Signed)
Spoke to daughter Venida Jarvis (per DPR) and gave results of MRI of mild DDD. Sherri verbalized understanding.

## 2014-07-19 DIAGNOSIS — M961 Postlaminectomy syndrome, not elsewhere classified: Secondary | ICD-10-CM | POA: Diagnosis not present

## 2014-07-19 DIAGNOSIS — M79606 Pain in leg, unspecified: Secondary | ICD-10-CM | POA: Diagnosis not present

## 2014-07-19 DIAGNOSIS — G629 Polyneuropathy, unspecified: Secondary | ICD-10-CM | POA: Diagnosis not present

## 2014-07-19 DIAGNOSIS — E669 Obesity, unspecified: Secondary | ICD-10-CM | POA: Diagnosis not present

## 2014-07-26 DIAGNOSIS — E782 Mixed hyperlipidemia: Secondary | ICD-10-CM | POA: Diagnosis not present

## 2014-07-26 DIAGNOSIS — I200 Unstable angina: Secondary | ICD-10-CM | POA: Diagnosis not present

## 2014-07-26 DIAGNOSIS — E538 Deficiency of other specified B group vitamins: Secondary | ICD-10-CM | POA: Diagnosis not present

## 2014-07-26 DIAGNOSIS — L932 Other local lupus erythematosus: Secondary | ICD-10-CM | POA: Diagnosis not present

## 2014-07-26 DIAGNOSIS — C44602 Unspecified malignant neoplasm of skin of right upper limb, including shoulder: Secondary | ICD-10-CM | POA: Diagnosis not present

## 2014-08-11 DIAGNOSIS — R600 Localized edema: Secondary | ICD-10-CM | POA: Diagnosis not present

## 2014-08-11 DIAGNOSIS — I482 Chronic atrial fibrillation: Secondary | ICD-10-CM | POA: Diagnosis not present

## 2014-08-11 DIAGNOSIS — I10 Essential (primary) hypertension: Secondary | ICD-10-CM | POA: Diagnosis not present

## 2014-08-15 ENCOUNTER — Other Ambulatory Visit: Payer: Self-pay

## 2014-08-16 DIAGNOSIS — M47817 Spondylosis without myelopathy or radiculopathy, lumbosacral region: Secondary | ICD-10-CM | POA: Diagnosis not present

## 2014-08-16 DIAGNOSIS — R600 Localized edema: Secondary | ICD-10-CM | POA: Diagnosis not present

## 2014-08-16 DIAGNOSIS — M961 Postlaminectomy syndrome, not elsewhere classified: Secondary | ICD-10-CM | POA: Diagnosis not present

## 2014-08-16 DIAGNOSIS — G894 Chronic pain syndrome: Secondary | ICD-10-CM | POA: Diagnosis not present

## 2014-08-16 DIAGNOSIS — M488X6 Other specified spondylopathies, lumbar region: Secondary | ICD-10-CM | POA: Diagnosis not present

## 2014-08-16 DIAGNOSIS — Z79899 Other long term (current) drug therapy: Secondary | ICD-10-CM | POA: Diagnosis not present

## 2014-08-16 DIAGNOSIS — M79606 Pain in leg, unspecified: Secondary | ICD-10-CM | POA: Diagnosis not present

## 2014-08-17 DIAGNOSIS — R609 Edema, unspecified: Secondary | ICD-10-CM | POA: Diagnosis not present

## 2014-08-18 DIAGNOSIS — I10 Essential (primary) hypertension: Secondary | ICD-10-CM | POA: Diagnosis not present

## 2014-08-18 DIAGNOSIS — I482 Chronic atrial fibrillation: Secondary | ICD-10-CM | POA: Diagnosis not present

## 2014-08-18 DIAGNOSIS — R600 Localized edema: Secondary | ICD-10-CM | POA: Diagnosis not present

## 2014-08-18 DIAGNOSIS — I5031 Acute diastolic (congestive) heart failure: Secondary | ICD-10-CM | POA: Diagnosis not present

## 2014-08-31 DIAGNOSIS — M47817 Spondylosis without myelopathy or radiculopathy, lumbosacral region: Secondary | ICD-10-CM | POA: Diagnosis not present

## 2014-08-31 DIAGNOSIS — H01002 Unspecified blepharitis right lower eyelid: Secondary | ICD-10-CM | POA: Diagnosis not present

## 2014-08-31 DIAGNOSIS — M488X6 Other specified spondylopathies, lumbar region: Secondary | ICD-10-CM | POA: Diagnosis not present

## 2014-08-31 DIAGNOSIS — M961 Postlaminectomy syndrome, not elsewhere classified: Secondary | ICD-10-CM | POA: Diagnosis not present

## 2014-08-31 DIAGNOSIS — H1132 Conjunctival hemorrhage, left eye: Secondary | ICD-10-CM | POA: Diagnosis not present

## 2014-08-31 DIAGNOSIS — H01001 Unspecified blepharitis right upper eyelid: Secondary | ICD-10-CM | POA: Diagnosis not present

## 2014-08-31 DIAGNOSIS — G894 Chronic pain syndrome: Secondary | ICD-10-CM | POA: Diagnosis not present

## 2014-08-31 DIAGNOSIS — H01004 Unspecified blepharitis left upper eyelid: Secondary | ICD-10-CM | POA: Diagnosis not present

## 2014-09-13 DIAGNOSIS — M79606 Pain in leg, unspecified: Secondary | ICD-10-CM | POA: Diagnosis not present

## 2014-09-13 DIAGNOSIS — M961 Postlaminectomy syndrome, not elsewhere classified: Secondary | ICD-10-CM | POA: Diagnosis not present

## 2014-09-13 DIAGNOSIS — M488X6 Other specified spondylopathies, lumbar region: Secondary | ICD-10-CM | POA: Diagnosis not present

## 2014-09-13 DIAGNOSIS — M47817 Spondylosis without myelopathy or radiculopathy, lumbosacral region: Secondary | ICD-10-CM | POA: Diagnosis not present

## 2014-10-11 IMAGING — CR DG CHEST 2V
2 series · 2 of 2 positions shown · non-contrast
Comparison: 05/18/2012

CLINICAL DATA: Shortness of breath, chest burning sensation

CHEST - 2 VIEW

[w chest pa]
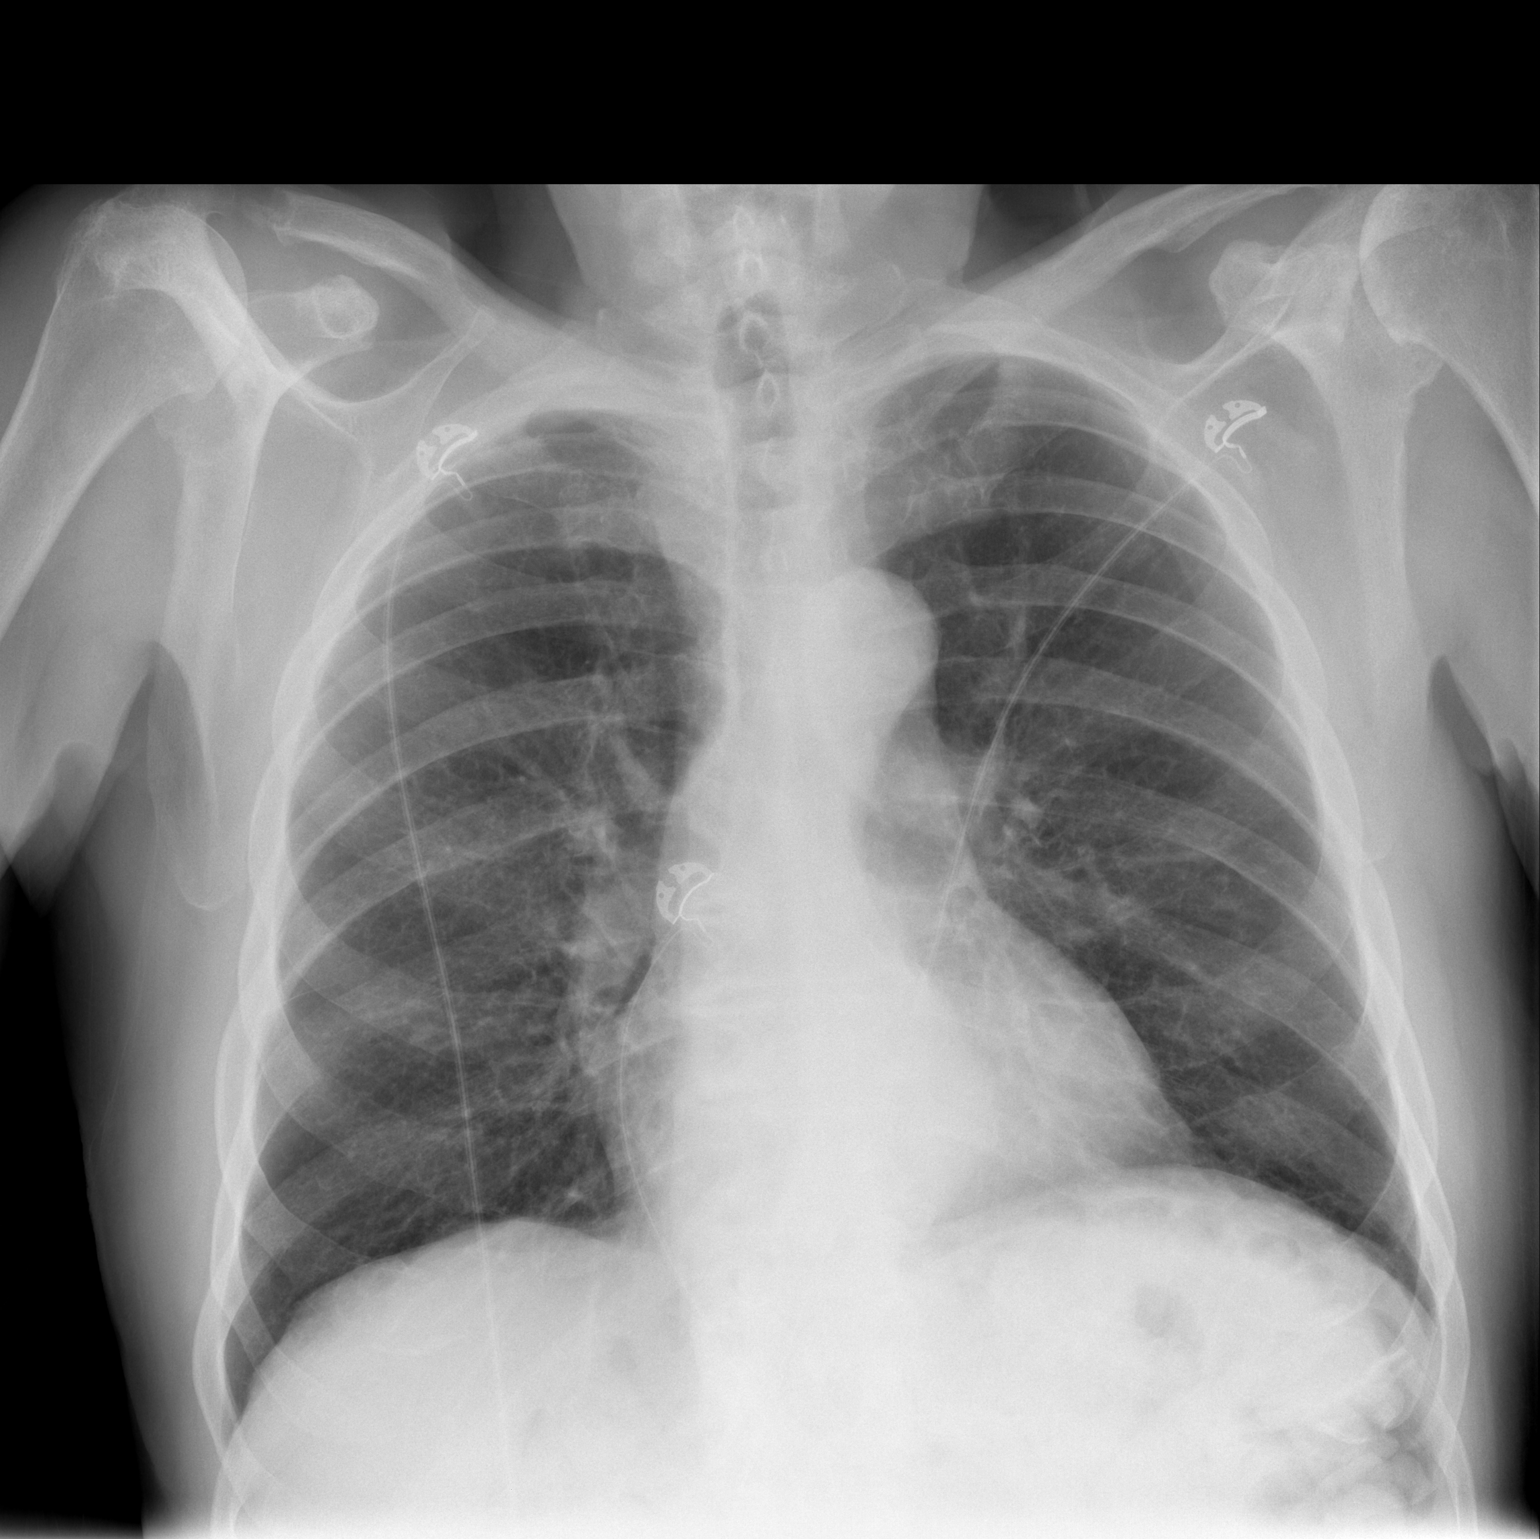

[w chest lat]
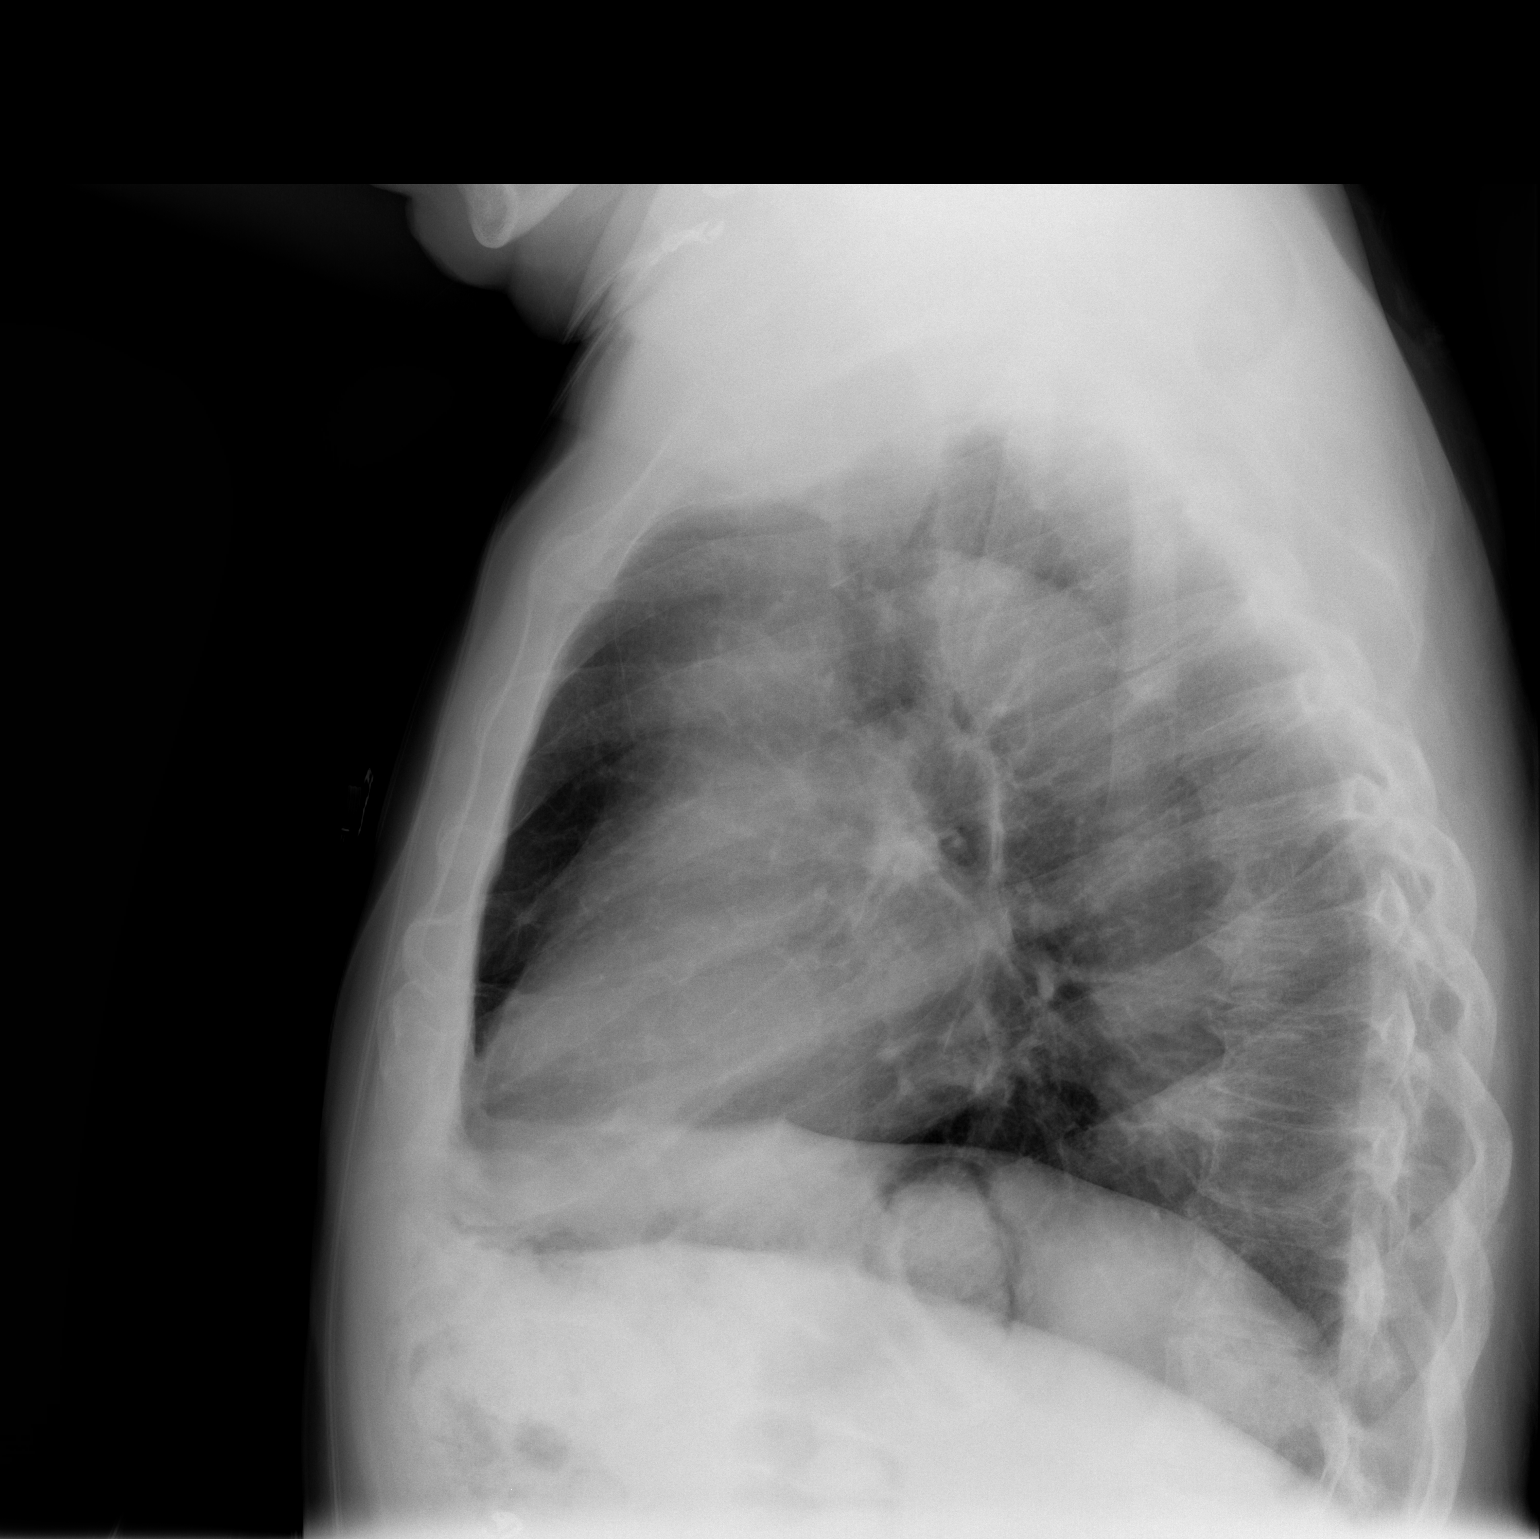

[2 of 2 positions shown; findings below may reference images not displayed]

FINDINGS: The heart size and mediastinal contours are within
normal limits.  Both lungs are clear.  The visualized skeletal
structures are unremarkable. Stable degenerative changes of the
thoracic spine.
IMPRESSION: No active cardiopulmonary disease.

## 2014-10-12 DIAGNOSIS — E669 Obesity, unspecified: Secondary | ICD-10-CM | POA: Diagnosis not present

## 2014-10-12 DIAGNOSIS — G894 Chronic pain syndrome: Secondary | ICD-10-CM | POA: Diagnosis not present

## 2014-10-12 DIAGNOSIS — M961 Postlaminectomy syndrome, not elsewhere classified: Secondary | ICD-10-CM | POA: Diagnosis not present

## 2014-10-12 DIAGNOSIS — M488X6 Other specified spondylopathies, lumbar region: Secondary | ICD-10-CM | POA: Diagnosis not present

## 2014-10-12 DIAGNOSIS — Z79899 Other long term (current) drug therapy: Secondary | ICD-10-CM | POA: Diagnosis not present

## 2014-10-12 DIAGNOSIS — M47817 Spondylosis without myelopathy or radiculopathy, lumbosacral region: Secondary | ICD-10-CM | POA: Diagnosis not present

## 2014-10-20 DIAGNOSIS — I10 Essential (primary) hypertension: Secondary | ICD-10-CM | POA: Diagnosis not present

## 2014-10-20 DIAGNOSIS — D531 Other megaloblastic anemias, not elsewhere classified: Secondary | ICD-10-CM | POA: Diagnosis not present

## 2014-10-20 DIAGNOSIS — R600 Localized edema: Secondary | ICD-10-CM | POA: Diagnosis not present

## 2014-10-20 DIAGNOSIS — I482 Chronic atrial fibrillation: Secondary | ICD-10-CM | POA: Diagnosis not present

## 2014-10-20 DIAGNOSIS — I5031 Acute diastolic (congestive) heart failure: Secondary | ICD-10-CM | POA: Diagnosis not present

## 2014-12-07 DIAGNOSIS — M961 Postlaminectomy syndrome, not elsewhere classified: Secondary | ICD-10-CM | POA: Diagnosis not present

## 2014-12-07 DIAGNOSIS — Z79899 Other long term (current) drug therapy: Secondary | ICD-10-CM | POA: Diagnosis not present

## 2014-12-07 DIAGNOSIS — G894 Chronic pain syndrome: Secondary | ICD-10-CM | POA: Diagnosis not present

## 2014-12-07 DIAGNOSIS — M1288 Other specific arthropathies, not elsewhere classified, other specified site: Secondary | ICD-10-CM | POA: Diagnosis not present

## 2014-12-07 DIAGNOSIS — M47817 Spondylosis without myelopathy or radiculopathy, lumbosacral region: Secondary | ICD-10-CM | POA: Diagnosis not present

## 2014-12-07 DIAGNOSIS — E669 Obesity, unspecified: Secondary | ICD-10-CM | POA: Diagnosis not present

## 2015-01-20 DIAGNOSIS — M12812 Other specific arthropathies, not elsewhere classified, left shoulder: Secondary | ICD-10-CM | POA: Diagnosis not present

## 2015-01-20 DIAGNOSIS — S42292A Other displaced fracture of upper end of left humerus, initial encounter for closed fracture: Secondary | ICD-10-CM | POA: Diagnosis not present

## 2015-01-31 DIAGNOSIS — M25519 Pain in unspecified shoulder: Secondary | ICD-10-CM | POA: Diagnosis not present

## 2015-01-31 DIAGNOSIS — G894 Chronic pain syndrome: Secondary | ICD-10-CM | POA: Diagnosis not present

## 2015-01-31 DIAGNOSIS — M961 Postlaminectomy syndrome, not elsewhere classified: Secondary | ICD-10-CM | POA: Diagnosis not present

## 2015-01-31 DIAGNOSIS — M1288 Other specific arthropathies, not elsewhere classified, other specified site: Secondary | ICD-10-CM | POA: Diagnosis not present

## 2015-01-31 DIAGNOSIS — E669 Obesity, unspecified: Secondary | ICD-10-CM | POA: Diagnosis not present

## 2015-01-31 DIAGNOSIS — Z79899 Other long term (current) drug therapy: Secondary | ICD-10-CM | POA: Diagnosis not present

## 2015-02-06 DIAGNOSIS — S42292A Other displaced fracture of upper end of left humerus, initial encounter for closed fracture: Secondary | ICD-10-CM | POA: Diagnosis not present

## 2015-02-06 DIAGNOSIS — M12812 Other specific arthropathies, not elsewhere classified, left shoulder: Secondary | ICD-10-CM | POA: Diagnosis not present

## 2015-03-23 DIAGNOSIS — M47817 Spondylosis without myelopathy or radiculopathy, lumbosacral region: Secondary | ICD-10-CM | POA: Diagnosis not present

## 2015-04-06 DIAGNOSIS — Z79899 Other long term (current) drug therapy: Secondary | ICD-10-CM | POA: Diagnosis not present

## 2015-04-06 DIAGNOSIS — G894 Chronic pain syndrome: Secondary | ICD-10-CM | POA: Diagnosis not present

## 2015-04-06 DIAGNOSIS — M47817 Spondylosis without myelopathy or radiculopathy, lumbosacral region: Secondary | ICD-10-CM | POA: Diagnosis not present

## 2015-04-24 DIAGNOSIS — I5031 Acute diastolic (congestive) heart failure: Secondary | ICD-10-CM | POA: Diagnosis not present

## 2015-04-24 DIAGNOSIS — I482 Chronic atrial fibrillation: Secondary | ICD-10-CM | POA: Diagnosis not present

## 2015-04-24 DIAGNOSIS — R600 Localized edema: Secondary | ICD-10-CM | POA: Diagnosis not present

## 2015-04-24 DIAGNOSIS — I10 Essential (primary) hypertension: Secondary | ICD-10-CM | POA: Diagnosis not present

## 2015-05-21 DIAGNOSIS — R7303 Prediabetes: Secondary | ICD-10-CM | POA: Insufficient documentation

## 2015-05-21 DIAGNOSIS — I2510 Atherosclerotic heart disease of native coronary artery without angina pectoris: Secondary | ICD-10-CM | POA: Insufficient documentation

## 2015-05-21 DIAGNOSIS — Z79899 Other long term (current) drug therapy: Secondary | ICD-10-CM | POA: Insufficient documentation

## 2015-05-21 DIAGNOSIS — E785 Hyperlipidemia, unspecified: Secondary | ICD-10-CM | POA: Insufficient documentation

## 2015-05-21 DIAGNOSIS — K219 Gastro-esophageal reflux disease without esophagitis: Secondary | ICD-10-CM | POA: Insufficient documentation

## 2015-05-21 DIAGNOSIS — E559 Vitamin D deficiency, unspecified: Secondary | ICD-10-CM | POA: Insufficient documentation

## 2015-05-21 NOTE — Patient Instructions (Signed)

## 2015-05-21 NOTE — Progress Notes (Signed)
Patient ID: [name], male   DOB: 05-04-1940, 75 y.o.   MRN: [id number]  Annual  Screening/Preventative Visit And Comprehensive Evaluation & Examination  This very nice 75 y.o. MWM presents as a new patient for a Wellness/Preventative Visit & comprehensive evaluation and management of multiple medical co-morbidities.  Patient has been previously dx'd with HTN, ASHD/Afib, Prediabetes, Hyperlipidemia and Vitamin D Deficiency. Patient has hx/o a progressive demyelinating peripheral polyeuropathy followed by Dr Krista Blue. Patient apparently also has mild Dementia and is supervised by his daughter(s) who transport him and supervise his medications.     Patient also is followed at [facility] for pain attributed to DJD/DDD and spinal stenosis.He also is s/p Bilat TKR.   HTN predates circa 1980's. Patient's BP has been controlled at home.Today's BP: 110/72 mmHg. Patient has hx/o ASCAD with MI's x 2 in 1997 & 1988 and in 2012 had PTCA & Stent by Dr Nadyne Coombes. Patient denies any cardiac symptoms as chest pain, palpitations, shortness of breath, dizziness or ankle swelling.   Patient's hyperlipidemia is controlled with diet and medications. Patient denies myalgias or other medication SE's. Last lipids in Epic in 2012  were at goal with Total Chol 114, HDL 25, Trig 201 and LDL 59   Patient is being screened expectantly for prediabetes  and patient denies reactive hypoglycemic symptoms, visual blurring, diabetic polys or paresthesias.   Finally, patient is not on a Vit D supplement and is anticipated to be deficient.   Medication Sig  . apixaban / ELIQUIS 5 MG  Take 5 mg by mouth 2 (two) times daily.  Marland Kitchen atorvastatin  10 MG  Take 10 mg by mouth daily.  . carvedilol  6.25 MG  Take 6.25 mg by mouth 2 (two) times daily with a meal.  . escitalopram  10 MG  Take 10 mg by mouth daily.  . Torosemide 20 MG   . gabapentin 100 MG  Take 100 mg by mouth 3 (three) times daily.  . NORCO 10-325 MG  Take 1 tablet by  mouth. Take one tab 4 times daily.  May take 1 additional tablet if needed.  Marland Kitchen NITROSTAT 0.4 MG SL    . OLANZapine / ZYPREXA 5 MG    . ZEGERID 40-1100 MG  Take 1 capsule by mouth daily before breakfast.  . ALDACTAZIDE 25-25 MG     Allergies  Allergen Reactions  . Acetaminophen Other (See Comments)    LIVER INFLAMMATION IN HIGH DOSES  . Lasix [Furosemide] Other (See Comments)    Could not function / could not get out of bed.   Past Medical History  Diagnosis Date  . Hypertension   . Hyperlipidemia   . Preoperative cardiovascular examination   . Spinal stenosis, lumbar   . CAS (cerebral atherosclerosis)   . Erectile dysfunction   . Preventative health care   . Benign prostatic hypertrophy with urinary obstruction   . Family history of early CAD     male 1st degree relative <50  . Peptic ulcer disease   . Low back pain   . Superficial spreading melanoma   . Sleep apnea, obstructive     CPAP-does not use  . HA (headache)     sinus headaches  . Arthritis     knees  . Myocardial infarction ([place]) 1997    Hx [place] and 1998  . Anxiety   . Pneumonia 2005  . Anemia   . Depression     takes Zoloft  . Neuropathy,  autonomic, idiopathic peripheral, other   . Blood transfusion 1 yr. ago    jerking,fever-after blood transfusion  . Blood transfusion     given wrong blood  . Coronary artery disease 1998    cardiac stent/ Clearance Dr Arnoldo Morale and Einar Gip with note on chart  . Dysrhythmia     hx of atrial fib per ov note of 12/12   . Bradycardia     hx of bradycardia in past per ov note of 12/12  . GERD (gastroesophageal reflux disease)    Health Maintenance  Topic Date Due  . ZOSTAVAX  01/11/1999  . PNA vac Low Risk Adult (2 of 2 - PPSV23) 01/01/2014  . INFLUENZA VACCINE  09/19/2015  . TETANUS/TDAP  11/24/2022   Immunization History  Administered Date(s) Administered  . Influenza Split 11/07/2011  . Influenza Whole 02/18/1997, 11/24/2006, 12/19/2008  . Influenza, High  Dose Seasonal PF 11/19/2012  . Pneumococcal Conjugate-13 01/01/2013  . Td 02/19/1995, 09/25/2007  . Tdap 11/23/2012   Past Surgical History  Procedure Laterality Date  . Esophagogastroduodenoscopy  2004  . Colonoscopy  2004  . Lumbar laminectomy    . Lumbar fusion    . Knee arthroscopy      left  . Shoulder arthroscopy      right  . Knee arthroscopy      right  . Decompression of the median nerve      left wrist and hand  . Revision of tkr  2011    on left  . Coronary stent placement    . Back surgery  2005-last    lumbar x2  . Excisional total knee arthroplasty  12/25/2010    Procedure: EXCISIONAL TOTAL KNEE ARTHROPLASTY;  Surgeon: Mauri Pole;  Location: [facility];  Service: Orthopedics;  Laterality: Right;  repeat irrigation   and debridement, removal and reinsertation of spacer block  . Joint replacement  2011    left knee x 2 ; 08/2010-right knee-infected  . I&d extremity  03/25/2011    Procedure: IRRIGATION AND DEBRIDEMENT EXTREMITY;  Surgeon: Mauri Pole, MD;  Location: [facility];  Service: Orthopedics;  Laterality: Right;  . Coronary angioplasty  [place]   . Total knee revision  05/13/2011    Procedure: TOTAL KNEE REVISION;  Surgeon: Mauri Pole, MD;  Location: [facility];  Service: Orthopedics;  Laterality: Right;  Reimplantation   Family History  Problem Relation Age of Onset  . Heart disease Father   . Stroke Mother   . Diabetes Sister   . Heart disease Brother   . Breast cancer Sister   . Breast cancer Sister    Social History   Social History  . Marital Status: Married    Spouse Name: Vaughan Basta  . Number of Children: 4  . Years of Education: HS   Occupational History  . diesel repair     part time  . worked at [employer]   . 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 Smoking status: Former Smoker -- 1.00 packs/day for 15 years    Types: Cigars    Quit date: 12/23/2009  . Smokeless tobacco: Never Used     Comment: smoked a pipe for 6-7 y  ears  . Alcohol Use: No  . Drug Use: No  . Sexual Activity: Not on file   Other Topics Concern  . Not on file   Social History Narrative   Caffeine 2 cups daily avg.  ROS Constitutional: Denies fever, chills, weight loss/gain, headaches, insomnia,  night sweats or change in appetite. Does c/o fatigue. Eyes: Denies redness, blurred vision, diplopia, discharge, itchy or watery eyes.  ENT: Denies discharge, congestion, post nasal drip, epistaxis, sore throat, earache, hearing loss, dental pain, Tinnitus, Vertigo, Sinus pain or snoring.  Cardio: Denies chest pain, palpitations, irregular heartbeat, syncope, dyspnea, diaphoresis, orthopnea, PND, claudication or edema Respiratory: denies cough, dyspnea, DOE, pleurisy, hoarseness, laryngitis or wheezing.  Gastrointestinal: Denies dysphagia, heartburn, reflux, water brash, pain, cramps, nausea, vomiting, bloating, diarrhea, constipation, hematemesis, melena, hematochezia, jaundice or hemorrhoids Genitourinary: Denies dysuria, frequency, urgency, nocturia, hesitancy, discharge, hematuria or flank pain Musculoskeletal: Denies arthralgia, myalgia, stiffness, Jt. Swelling, pain, limp or strain/sprain. Denies Falls. Skin: Denies puritis, rash, hives, warts, acne, eczema or change in skin lesion Neuro: No tremor, incoordination, spasms, paresthesia, but does have chronic LBP. Psychiatric: Denies confusion, memory loss or sensory loss. Denies Depression. Endocrine: Denies change in weight, skin, hair change, nocturia, and paresthesia, diabetic polys, visual blurring or hyper / hypo glycemic episodes.  Heme/Lymph: No excessive bleeding, bruising or enlarged lymph nodes.  Physical Exam  BP 110/72 mmHg  Pulse 72  Temp(Src) 97.3 F (36.3 C)  Resp 16  Ht 5\' 9"  (1.753 m)  Wt 208 lb (94.348 kg)  BMI 30.70 kg/m2  General Appearance: Well nourished, in no apparent distress. Eyes: PERRLA, EOMs, conjunctiva no swelling or erythema, normal fundi and  vessels. Sinuses: No frontal/maxillary tenderness ENT/Mouth: EACs patent / TMs  nl. Nares clear without erythema, swelling, mucoid exudates. Oral hygiene is good. No erythema, swelling, or exudate. Tongue normal, non-obstructing. Tonsils not swollen or erythematous. Hearing normal.  Neck: Supple, thyroid normal. No bruits, nodes or JVD. Respiratory: Respiratory effort normal.  BS equal and clear bilateral without rales, rhonci, wheezing or stridor. Cardio: Heart sounds are normal with regular rate and rhythm and no murmurs, rubs or gallops. Peripheral pulses are normal and equal bilaterally without edema. No aortic or femoral bruits. Chest: symmetric with normal excursions and percussion.  Abdomen: Soft, with Nl bowel sounds. Nontender, no guarding, rebound, hernias, masses, or organomegaly.  Lymphatics: Non tender without lymphadenopathy.  Genitourinary: deferred by patient request Musculoskeletal: Full ROM all peripheral extremities, joint stability and gait with bilat footdrop. Skin: Warm and dry without rashes, lesions, cyanosis, clubbing or  ecchymosis.  Neuro: Cranial nerves intact, reflexes flat to absent. Decreased muscle power, tone & bulk. Sensation decreased to monofilament & Vibratory in a stocking/glove distribution (Lower>Upper).  Pysch: Alert and oriented X 3 with flat affect, insight and judgment concrete and limited.   Assessment and Plan  1. Annual Preventative/Screening Exam    2. Essential hypertension  - Microalbumin / creatinine urine ratio - EKG 12-Lead - [place], RETROPERITNL ABD,  LTD - TSH  3. Hyperlipidemia  - Lipid panel - TSH  4. Prediabetes  - Hemoglobin A1c - Insulin, random  5. Vitamin D deficiency  - VITAMIN D 25 Hydroxy   6. Chronic atrial fibrillation (HCC)   7. Hereditary and idiopathic peripheral neuropathy  - LOW EXTREMITY NEUR EXAM DOCUM  8. Chronic low back pain   9. Screening for rectal cancer  - POC Hemoccult Bld/Stl   10.  Prostate cancer screening  - PSA  11. Medication management  - Urinalysis, Routine w reflex microscopic  - CBC with Differential/Platelet - BASIC METABOLIC PANEL WITH GFR - Hepatic function panel - Magnesium  12. Gastroesophageal reflux disease   13. ASHD     Continue prudent diet as discussed,  weight control, BP monitoring, regular exercise, and medications as discussed.  Discussed med effects and SE's. Routine screening labs and tests as requested with regular follow-up as recommended. Over 40 minutes of exam, counseling, chart review and high complex critical decision making was performed. Discussed with daughter who manages his meds tapering off of the [place]

## 2015-05-22 ENCOUNTER — Ambulatory Visit (INDEPENDENT_AMBULATORY_CARE_PROVIDER_SITE_OTHER): Payer: Medicare Other | Admitting: Internal Medicine

## 2015-05-22 ENCOUNTER — Encounter: Payer: Self-pay | Admitting: Internal Medicine

## 2015-05-22 VITALS — BP 110/72 | HR 72 | Temp 97.3°F | Resp 16 | Ht 69.0 in | Wt 208.0 lb

## 2015-05-22 DIAGNOSIS — I2510 Atherosclerotic heart disease of native coronary artery without angina pectoris: Secondary | ICD-10-CM

## 2015-05-22 DIAGNOSIS — E785 Hyperlipidemia, unspecified: Secondary | ICD-10-CM

## 2015-05-22 DIAGNOSIS — M545 Low back pain, unspecified: Secondary | ICD-10-CM

## 2015-05-22 DIAGNOSIS — Z125 Encounter for screening for malignant neoplasm of prostate: Secondary | ICD-10-CM | POA: Diagnosis not present

## 2015-05-22 DIAGNOSIS — I10 Essential (primary) hypertension: Secondary | ICD-10-CM

## 2015-05-22 DIAGNOSIS — I482 Chronic atrial fibrillation, unspecified: Secondary | ICD-10-CM

## 2015-05-22 DIAGNOSIS — E559 Vitamin D deficiency, unspecified: Secondary | ICD-10-CM | POA: Diagnosis not present

## 2015-05-22 DIAGNOSIS — Z0000 Encounter for general adult medical examination without abnormal findings: Secondary | ICD-10-CM

## 2015-05-22 DIAGNOSIS — G609 Hereditary and idiopathic neuropathy, unspecified: Secondary | ICD-10-CM

## 2015-05-22 DIAGNOSIS — Z136 Encounter for screening for cardiovascular disorders: Secondary | ICD-10-CM | POA: Diagnosis not present

## 2015-05-22 DIAGNOSIS — Z79899 Other long term (current) drug therapy: Secondary | ICD-10-CM

## 2015-05-22 DIAGNOSIS — R7303 Prediabetes: Secondary | ICD-10-CM | POA: Diagnosis not present

## 2015-05-22 DIAGNOSIS — G8929 Other chronic pain: Secondary | ICD-10-CM

## 2015-05-22 DIAGNOSIS — R7309 Other abnormal glucose: Secondary | ICD-10-CM | POA: Diagnosis not present

## 2015-05-22 DIAGNOSIS — K219 Gastro-esophageal reflux disease without esophagitis: Secondary | ICD-10-CM

## 2015-05-22 DIAGNOSIS — Z0001 Encounter for general adult medical examination with abnormal findings: Secondary | ICD-10-CM

## 2015-05-22 DIAGNOSIS — Z1212 Encounter for screening for malignant neoplasm of rectum: Secondary | ICD-10-CM

## 2015-05-22 LAB — HEPATIC FUNCTION PANEL
ALT: 12 U/L (ref 9–46)
AST: 16 U/L (ref 10–35)
Albumin: 4 g/dL (ref 3.6–5.1)
Alkaline Phosphatase: 124 U/L — ABNORMAL HIGH (ref 40–115)
BILIRUBIN DIRECT: 0.1 mg/dL (ref <=0.2)
BILIRUBIN INDIRECT: 0.3 mg/dL (ref 0.2–1.2)
BILIRUBIN TOTAL: 0.4 mg/dL (ref 0.2–1.2)
Total Protein: 7 g/dL (ref 6.1–8.1)

## 2015-05-22 LAB — BASIC METABOLIC PANEL WITH GFR
BUN: 33 mg/dL — ABNORMAL HIGH (ref 7–25)
CALCIUM: 9.4 mg/dL (ref 8.6–10.3)
CO2: 24 mmol/L (ref 20–31)
CREATININE: 1.4 mg/dL — AB (ref 0.70–1.18)
Chloride: 101 mmol/L (ref 98–110)
GFR, EST AFRICAN AMERICAN: 56 mL/min — AB (ref >=60)
GFR, Est Non African American: 48 mL/min — ABNORMAL LOW (ref >=60)
GLUCOSE: 94 mg/dL (ref 65–99)
Potassium: 5 mmol/L (ref 3.5–5.3)
SODIUM: 136 mmol/L (ref 135–146)

## 2015-05-22 LAB — LIPID PANEL
CHOL/HDL RATIO: 4.7 ratio (ref <=5.0)
Cholesterol: 112 mg/dL — ABNORMAL LOW (ref 125–200)
HDL: 24 mg/dL — ABNORMAL LOW (ref >=40)
LDL Cholesterol: 41 mg/dL (ref <130)
Triglycerides: 236 mg/dL — ABNORMAL HIGH (ref <150)
VLDL: 47 mg/dL — AB (ref <30)

## 2015-05-22 LAB — CBC WITH DIFFERENTIAL/PLATELET
BASOS ABS: 96 {cells}/uL (ref 0–200)
Basophils Relative: 1 %
EOS PCT: 5 %
Eosinophils Absolute: 480 cells/uL (ref 15–500)
HCT: 36.7 % — ABNORMAL LOW (ref 38.5–50.0)
HEMOGLOBIN: 11.1 g/dL — AB (ref 13.2–17.1)
LYMPHS ABS: 2112 {cells}/uL (ref 850–3900)
LYMPHS PCT: 22 %
MCH: 23.4 pg — AB (ref 27.0–33.0)
MCHC: 30.2 g/dL — AB (ref 32.0–36.0)
MCV: 77.4 fL — ABNORMAL LOW (ref 80.0–100.0)
MPV: 7.7 fL (ref 7.5–12.5)
Monocytes Absolute: 576 cells/uL (ref 200–950)
Monocytes Relative: 6 %
NEUTROS PCT: 66 %
Neutro Abs: 6336 cells/uL (ref 1500–7800)
Platelets: 281 10*3/uL (ref 140–400)
RBC: 4.74 MIL/uL (ref 4.20–5.80)
RDW: 17.5 % — AB (ref 11.0–15.0)
WBC: 9.6 10*3/uL (ref 3.8–10.8)

## 2015-05-22 LAB — MAGNESIUM: Magnesium: 2.1 mg/dL (ref 1.5–2.5)

## 2015-05-22 LAB — TSH: TSH: 0.63 mIU/L (ref 0.40–4.50)

## 2015-05-22 LAB — HEMOGLOBIN A1C
Hgb A1c MFr Bld: 6.1 % — ABNORMAL HIGH (ref <5.7)
Mean Plasma Glucose: 128 mg/dL

## 2015-05-23 LAB — URINALYSIS, ROUTINE W REFLEX MICROSCOPIC
BILIRUBIN URINE: NEGATIVE
Glucose, UA: NEGATIVE
HGB URINE DIPSTICK: NEGATIVE
KETONES UR: NEGATIVE
Leukocytes, UA: NEGATIVE
NITRITE: NEGATIVE
PROTEIN: NEGATIVE
SPECIFIC GRAVITY, URINE: 1.013 (ref 1.001–1.035)
pH: 6.5 (ref 5.0–8.0)

## 2015-05-23 LAB — MICROALBUMIN / CREATININE URINE RATIO
CREATININE, URINE: 39 mg/dL (ref 20–370)
MICROALB UR: 0.3 mg/dL
MICROALB/CREAT RATIO: 8 ug/mg{creat} (ref <30)

## 2015-05-23 LAB — INSULIN, RANDOM: Insulin: 56.3 u[IU]/mL — ABNORMAL HIGH (ref 2.0–19.6)

## 2015-05-23 LAB — VITAMIN D 25 HYDROXY (VIT D DEFICIENCY, FRACTURES): Vit D, 25-Hydroxy: 14 ng/mL — ABNORMAL LOW (ref 30–100)

## 2015-05-23 LAB — PSA: PSA: 0.44 ng/mL (ref <=4.00)

## 2015-06-01 DIAGNOSIS — G894 Chronic pain syndrome: Secondary | ICD-10-CM | POA: Diagnosis not present

## 2015-06-01 DIAGNOSIS — M1288 Other specific arthropathies, not elsewhere classified, other specified site: Secondary | ICD-10-CM | POA: Diagnosis not present

## 2015-06-01 DIAGNOSIS — E669 Obesity, unspecified: Secondary | ICD-10-CM | POA: Diagnosis not present

## 2015-06-01 DIAGNOSIS — M25559 Pain in unspecified hip: Secondary | ICD-10-CM | POA: Diagnosis not present

## 2015-06-01 DIAGNOSIS — Z79891 Long term (current) use of opiate analgesic: Secondary | ICD-10-CM | POA: Diagnosis not present

## 2015-06-01 DIAGNOSIS — M961 Postlaminectomy syndrome, not elsewhere classified: Secondary | ICD-10-CM | POA: Diagnosis not present

## 2015-06-01 DIAGNOSIS — Z79899 Other long term (current) drug therapy: Secondary | ICD-10-CM | POA: Diagnosis not present

## 2015-06-26 ENCOUNTER — Other Ambulatory Visit: Payer: Self-pay | Admitting: Anesthesiology

## 2015-06-26 ENCOUNTER — Ambulatory Visit
Admission: RE | Admit: 2015-06-26 | Discharge: 2015-06-26 | Disposition: A | Discharge status: Home / Self Care | Payer: Medicare Other | Source: Ambulatory Visit | Attending: Anesthesiology | Admitting: Anesthesiology

## 2015-06-26 DIAGNOSIS — M25552 Pain in left hip: Secondary | ICD-10-CM

## 2015-06-26 DIAGNOSIS — S79912A Unspecified injury of left hip, initial encounter: Secondary | ICD-10-CM | POA: Diagnosis not present

## 2015-07-25 ENCOUNTER — Ambulatory Visit (INDEPENDENT_AMBULATORY_CARE_PROVIDER_SITE_OTHER): Payer: Medicare Other | Admitting: Internal Medicine

## 2015-07-25 ENCOUNTER — Encounter: Payer: Self-pay | Admitting: Internal Medicine

## 2015-07-25 ENCOUNTER — Ambulatory Visit (HOSPITAL_COMMUNITY)
Admission: RE | Admit: 2015-07-25 | Discharge: 2015-07-25 | Disposition: A | Discharge status: Home / Self Care | Payer: Medicare Other | Source: Ambulatory Visit | Attending: Internal Medicine | Admitting: Internal Medicine

## 2015-07-25 VITALS — BP 106/64 | HR 76 | Temp 97.3°F | Resp 16 | Ht 69.0 in | Wt 202.0 lb

## 2015-07-25 DIAGNOSIS — M545 Low back pain: Secondary | ICD-10-CM

## 2015-07-25 NOTE — Progress Notes (Signed)
Subjective:    Patient ID: [name], male    DOB: [birth year], 75 y.o.   MRN: [id number]  HPI  Patient is a 75 yo MWM with Dementai who was 1st seen 05/22/2015 as a new patient. Patient hx/o HTN, ASHD/pAfib, HLD, GERD, preDM, peripheral Neuropathy and moderate Dementia and is also followed at the [facility] and he is on a moderate amount of Hydrocodone  (10 mg qid) and in discussion with a daughter it was recommend that he be tapered off of his Zyprexa with concern that it was additive to his mental dulling along with Gabapentin 300 mg tid. Now he's brought in today by his other daughter reporting that for the last 2 weeks he has more c/o LBP.  Medication Sig  . apixaban (ELIQUIS) 5 MG Take 5 mg by mouth 2 (two) times daily.  Marland Kitchen atorvastatin  10 MG  Take 10 mg by mouth daily.  Marland Kitchen azaTHIOprine  50 MG One po bid xone week, then 2 tabs po bid  . carvedilol  6.25 MG  Take 6.25 mg by mouth 2 (two) times daily with a meal.  . escitalopram 10 MG Take 10 mg by mouth daily.  [name] 10-325  Take 1 tablet by mouth. Take one tab 4 times daily.  Marland Kitchen NITROSTAT 0.4 MG SL   . OLANZapine (ZYPREXA) 5 MG Stopped   . ZEGERID 40-1100 MG  Take 1 capsule by mouth daily before breakfast.  . spironolactone-hctz 25-25 MG    . torsemide  20 MG tablet   . gabapentin  300 MG capsule Take 100 mg by mouth 3 (three) times daily.   Allergies  Allergen Reactions  . Acetaminophen Other (See Comments)    LIVER INFLAMMATION IN HIGH DOSES  . Lasix [Furosemide] Other (See Comments)    Could not function / could not get out of bed.   Past Medical History  Diagnosis Date  . Hypertension   . Hyperlipidemia   . Preoperative cardiovascular examination   . Spinal stenosis, lumbar   . CAS (cerebral atherosclerosis)   . Erectile dysfunction   . Preventative health care   . Benign prostatic hypertrophy with urinary obstruction   . Family history of early CAD     male 1st degree relative <50  . Peptic ulcer  disease   . Low back pain   . Superficial spreading melanoma   . Sleep apnea, obstructive     CPAP-does not use  . HA (headache)     sinus headaches  . Arthritis     knees  . Myocardial infarction ([place]) 1997    Hx Short and 1998  . Anxiety   . Pneumonia 2005  . Anemia   . Depression     takes Zoloft  . Neuropathy, autonomic, idiopathic peripheral, other   . Blood transfusion 1 yr. ago    jerking,fever-after blood transfusion  . Blood transfusion     given wrong blood  . Coronary artery disease 1998    cardiac stent/ Clearance Dr Arnoldo Morale and Einar Gip with note on chart  . Dysrhythmia     hx of atrial fib per ov note of 12/12   . Bradycardia     hx of bradycardia in past per ov note of 12/12  . GERD (gastroesophageal reflux disease)    Past Surgical History  Procedure Laterality Date  . Esophagogastroduodenoscopy  2004  . Colonoscopy  2004  . Lumbar laminectomy    . Lumbar fusion    .  Knee arthroscopy      left  . Shoulder arthroscopy      right  . Knee arthroscopy      right  . Decompression of the median nerve      left wrist and hand  . Revision of tkr  2011    on left  . Coronary stent placement    . Back surgery  2005-last    lumbar x2  . Excisional total knee arthroplasty  12/25/2010    Procedure: EXCISIONAL TOTAL KNEE ARTHROPLASTY; Mauri Pole -  repeat irrigation   and debridement, removal and reinsert of spacer block  . Joint replacement  2011    left knee x 2 ; 08/2010-right knee-infected  . I&d extremity  03/25/2011    Procedure: IRRIGATION AND DEBRIDEMENT EXTREMITY;  Surgeon: Mauri Pole, MD  . Coronary angioplasty  [place]   . Total knee revision  05/13/2011    Procedure: TOTAL KNEE REVISION;  Surgeon: Mauri Pole, MD   Review of Systems 10 point systems review negative except as above.    Objective:   Physical Exam  BP 106/64 mmHg  Pulse 76  Temp(Src) 97.3 F (36.3 C)  Resp 16  Ht 5\' 9"  (1.753 m)  Wt 202 lb (91.627 kg)   BMI 29.82 kg/m2  HEENT - Eac's patent. TM's Nl. EOM's full. PERRLA. NasoOroPharynx clear. Neck - supple. Nl Thyroid. Carotids 2+ & No bruits, nodes, JVD Chest - Clear equal BS w/o Rales, rhonchi, wheezes. Cor - Nl HS. RRR w/o sig MGR. PP 1(+). No edema. Abd - soft , benign, non-tender MS- FROM w/o deformities.  In a wheel chair. Sl tender in mid to low lumbar area.  Neuro - No obvious Cr N abnormalities. Sensory, motor and Cerebellar functions appear Nl w/o focal abnormalities. Psyche - Mental status - dulled.  Poor ST recall.     Assessment & Plan:   1. Low back pain without sciatica, unspecified back pain laterality  - DG Lumbar Spine Complete; Future- recommended to daughter to restart Zyprexa to see if helps with his discomfort.

## 2015-07-25 NOTE — Patient Instructions (Signed)
Restart Zyprexa

## 2015-07-27 DIAGNOSIS — M47817 Spondylosis without myelopathy or radiculopathy, lumbosacral region: Secondary | ICD-10-CM | POA: Diagnosis not present

## 2015-07-27 DIAGNOSIS — Z79899 Other long term (current) drug therapy: Secondary | ICD-10-CM | POA: Diagnosis not present

## 2015-07-27 DIAGNOSIS — M1288 Other specific arthropathies, not elsewhere classified, other specified site: Secondary | ICD-10-CM | POA: Diagnosis not present

## 2015-07-27 DIAGNOSIS — G894 Chronic pain syndrome: Secondary | ICD-10-CM | POA: Diagnosis not present

## 2015-07-27 DIAGNOSIS — Z79891 Long term (current) use of opiate analgesic: Secondary | ICD-10-CM | POA: Diagnosis not present

## 2015-07-27 DIAGNOSIS — M961 Postlaminectomy syndrome, not elsewhere classified: Secondary | ICD-10-CM | POA: Diagnosis not present

## 2015-08-08 ENCOUNTER — Telehealth: Payer: Self-pay | Admitting: *Deleted

## 2015-08-08 NOTE — Telephone Encounter (Signed)
Called the patient's daughter in regard to the Cologuard test.  Per the daughter, she made several attempts to persuade the patient to do the test, but he refused and disposed of the kit.  Dr Melford Aase advised.

## 2015-08-09 ENCOUNTER — Other Ambulatory Visit: Payer: Self-pay | Admitting: *Deleted

## 2015-08-09 MED ORDER — AZATHIOPRINE 50 MG PO TABS: ORAL_TABLET | ORAL | Status: DC

## 2015-08-14 ENCOUNTER — Other Ambulatory Visit: Payer: Self-pay | Admitting: *Deleted

## 2015-08-14 MED ORDER — OLANZAPINE 5 MG PO TABS: 5.0000 mg | ORAL_TABLET | Freq: Every day | ORAL | Status: DC

## 2015-08-25 ENCOUNTER — Ambulatory Visit: Payer: Self-pay | Admitting: Internal Medicine

## 2015-09-04 DIAGNOSIS — Z79899 Other long term (current) drug therapy: Secondary | ICD-10-CM | POA: Diagnosis not present

## 2015-09-04 DIAGNOSIS — M47817 Spondylosis without myelopathy or radiculopathy, lumbosacral region: Secondary | ICD-10-CM | POA: Diagnosis not present

## 2015-09-04 DIAGNOSIS — M961 Postlaminectomy syndrome, not elsewhere classified: Secondary | ICD-10-CM | POA: Diagnosis not present

## 2015-09-04 DIAGNOSIS — G894 Chronic pain syndrome: Secondary | ICD-10-CM | POA: Diagnosis not present

## 2015-09-04 DIAGNOSIS — Z79891 Long term (current) use of opiate analgesic: Secondary | ICD-10-CM | POA: Diagnosis not present

## 2015-09-04 DIAGNOSIS — M1288 Other specific arthropathies, not elsewhere classified, other specified site: Secondary | ICD-10-CM | POA: Diagnosis not present

## 2015-09-04 DIAGNOSIS — M545 Low back pain: Secondary | ICD-10-CM | POA: Diagnosis not present

## 2015-09-12 ENCOUNTER — Ambulatory Visit: Payer: Self-pay | Admitting: Physician Assistant

## 2015-10-03 DIAGNOSIS — G894 Chronic pain syndrome: Secondary | ICD-10-CM | POA: Diagnosis not present

## 2015-10-03 DIAGNOSIS — M545 Low back pain: Secondary | ICD-10-CM | POA: Diagnosis not present

## 2015-10-03 DIAGNOSIS — Z79899 Other long term (current) drug therapy: Secondary | ICD-10-CM | POA: Diagnosis not present

## 2015-10-03 DIAGNOSIS — Z79891 Long term (current) use of opiate analgesic: Secondary | ICD-10-CM | POA: Diagnosis not present

## 2015-10-03 DIAGNOSIS — M961 Postlaminectomy syndrome, not elsewhere classified: Secondary | ICD-10-CM | POA: Diagnosis not present

## 2015-10-03 DIAGNOSIS — M1288 Other specific arthropathies, not elsewhere classified, other specified site: Secondary | ICD-10-CM | POA: Diagnosis not present

## 2015-10-03 DIAGNOSIS — M47817 Spondylosis without myelopathy or radiculopathy, lumbosacral region: Secondary | ICD-10-CM | POA: Diagnosis not present

## 2015-11-16 ENCOUNTER — Ambulatory Visit: Payer: Self-pay | Admitting: Internal Medicine

## 2015-11-30 ENCOUNTER — Ambulatory Visit: Payer: Self-pay | Admitting: Internal Medicine

## 2015-12-01 ENCOUNTER — Ambulatory Visit (INDEPENDENT_AMBULATORY_CARE_PROVIDER_SITE_OTHER): Payer: Medicare Other | Admitting: Internal Medicine

## 2015-12-01 VITALS — BP 124/72 | HR 72 | Temp 97.0°F | Resp 16 | Ht 69.0 in

## 2015-12-01 DIAGNOSIS — I482 Chronic atrial fibrillation, unspecified: Secondary | ICD-10-CM

## 2015-12-01 DIAGNOSIS — Z79899 Other long term (current) drug therapy: Secondary | ICD-10-CM

## 2015-12-01 DIAGNOSIS — N3000 Acute cystitis without hematuria: Secondary | ICD-10-CM | POA: Diagnosis not present

## 2015-12-01 DIAGNOSIS — I10 Essential (primary) hypertension: Secondary | ICD-10-CM

## 2015-12-01 DIAGNOSIS — E782 Mixed hyperlipidemia: Secondary | ICD-10-CM | POA: Diagnosis not present

## 2015-12-01 DIAGNOSIS — R7303 Prediabetes: Secondary | ICD-10-CM | POA: Diagnosis not present

## 2015-12-01 DIAGNOSIS — R5383 Other fatigue: Secondary | ICD-10-CM | POA: Diagnosis not present

## 2015-12-01 DIAGNOSIS — E559 Vitamin D deficiency, unspecified: Secondary | ICD-10-CM | POA: Diagnosis not present

## 2015-12-01 LAB — CBC WITH DIFFERENTIAL/PLATELET
BASOS ABS: 0 {cells}/uL (ref 0–200)
Basophils Relative: 0 %
EOS PCT: 4 %
Eosinophils Absolute: 420 cells/uL (ref 15–500)
HCT: 35.4 % — ABNORMAL LOW (ref 38.5–50.0)
Hemoglobin: 11.2 g/dL — ABNORMAL LOW (ref 13.2–17.1)
Lymphocytes Relative: 21 %
Lymphs Abs: 2205 cells/uL (ref 850–3900)
MCH: 24.2 pg — AB (ref 27.0–33.0)
MCHC: 31.6 g/dL — AB (ref 32.0–36.0)
MCV: 76.6 fL — ABNORMAL LOW (ref 80.0–100.0)
MONOS PCT: 11 %
MPV: 7.8 fL (ref 7.5–12.5)
Monocytes Absolute: 1155 cells/uL — ABNORMAL HIGH (ref 200–950)
NEUTROS PCT: 64 %
Neutro Abs: 6720 cells/uL (ref 1500–7800)
PLATELETS: 273 10*3/uL (ref 140–400)
RBC: 4.62 MIL/uL (ref 4.20–5.80)
RDW: 20.5 % — AB (ref 11.0–15.0)
WBC: 10.5 10*3/uL (ref 3.8–10.8)

## 2015-12-01 LAB — BASIC METABOLIC PANEL WITH GFR
BUN: 24 mg/dL (ref 7–25)
CALCIUM: 9.1 mg/dL (ref 8.6–10.3)
CO2: 25 mmol/L (ref 20–31)
CREATININE: 1.39 mg/dL — AB (ref 0.70–1.18)
Chloride: 97 mmol/L — ABNORMAL LOW (ref 98–110)
GFR, EST AFRICAN AMERICAN: 57 mL/min — AB (ref >=60)
GFR, EST NON AFRICAN AMERICAN: 49 mL/min — AB (ref >=60)
Glucose, Bld: 76 mg/dL (ref 65–99)
Potassium: 4.6 mmol/L (ref 3.5–5.3)
SODIUM: 132 mmol/L — AB (ref 135–146)

## 2015-12-01 LAB — HEPATIC FUNCTION PANEL
ALT: 16 U/L (ref 9–46)
AST: 20 U/L (ref 10–35)
Albumin: 3.7 g/dL (ref 3.6–5.1)
Alkaline Phosphatase: 104 U/L (ref 40–115)
BILIRUBIN DIRECT: 0.1 mg/dL (ref <=0.2)
BILIRUBIN TOTAL: 0.4 mg/dL (ref 0.2–1.2)
Indirect Bilirubin: 0.3 mg/dL (ref 0.2–1.2)
Total Protein: 6.8 g/dL (ref 6.1–8.1)

## 2015-12-01 LAB — URINALYSIS, ROUTINE W REFLEX MICROSCOPIC
BILIRUBIN URINE: NEGATIVE
GLUCOSE, UA: NEGATIVE
HGB URINE DIPSTICK: NEGATIVE
Ketones, ur: NEGATIVE
Leukocytes, UA: NEGATIVE
Nitrite: NEGATIVE
PROTEIN: NEGATIVE
Specific Gravity, Urine: 1.007 (ref 1.001–1.035)
pH: 6 (ref 5.0–8.0)

## 2015-12-01 LAB — MAGNESIUM: MAGNESIUM: 2.2 mg/dL (ref 1.5–2.5)

## 2015-12-01 LAB — TSH: TSH: 0.39 mIU/L — ABNORMAL LOW (ref 0.40–4.50)

## 2015-12-01 LAB — LIPID PANEL
CHOL/HDL RATIO: 3.8 ratio (ref <=5.0)
CHOLESTEROL: 110 mg/dL — AB (ref 125–200)
HDL: 29 mg/dL — AB (ref >=40)
LDL Cholesterol: 44 mg/dL (ref <130)
TRIGLYCERIDES: 183 mg/dL — AB (ref <150)
VLDL: 37 mg/dL — ABNORMAL HIGH (ref <30)

## 2015-12-01 NOTE — Patient Instructions (Signed)

## 2015-12-01 NOTE — Progress Notes (Signed)
[facility] Unk Pinto, M.D.        Uvaldo Bristle. Silverio Lay, P.A.-C       Starlyn Skeans, P.A.-C  [facility]                [address] [place] [id number] Telephone [phone] Telefax [phone] ______________________________________________________________________     This very nice 75 y.o. MWM presents for follow up with Hypertension, Hyperlipidemia, Pre-Diabetes and Vitamin D Deficiency.  Patient is followed by Dr Krista Blue for SDAT and  a progressive demyelinating peripheral polyeuropathy . Patient is s/p Bilat TKR and is also followed at [facility] for pain attributed to DJD/DDD and spinal stenosis. Patient is brought in by a son today. Son reports he seems more confused and is sleeping more during the daytime.      Patient is treated for HTN since the 1980's and  has hx/o ASCAD with MI's x 2 in 1997 & 1988 and in 2012 had PTCA & Stent by Dr Nadyne Coombes.  BP has been controlled and today's BP is  124/72. Patient has had no complaints of any cardiac type chest pain, palpitations, dyspnea/orthopnea/PND, dizziness, claudication, or dependent edema.     Hyperlipidemia is controlled with diet & meds. Patient denies myalgias or other med SE's. Last Lipids were at goal albeit elevated Trig's: Lab Results  Component Value Date   CHOL 112 (L) 05/22/2015   HDL 24 (L) 05/22/2015   LDLCALC 41 05/22/2015   LDLDIRECT 59.1 03/28/2010   TRIG 236 (H) 05/22/2015   CHOLHDL 4.7 05/22/2015      Also, the patient has history of PreDiabetes and has had no symptoms of reactive hypoglycemia, diabetic polys, paresthesias or visual blurring.  Last A1c was not at goal: Lab Results  Component Value Date   HGBA1C 6.1 (H) 05/22/2015      Further, the patient also has history of Vitamin D Deficiency and supplements vitamin D without any suspected side-effects. Last vitamin D was   Lab Results  Component Value Date   VD25OH 14 (L) 05/22/2015   Current Outpatient Prescriptions on File Prior to Visit  Medication Sig  . apixaban (ELIQUIS) 5 MG TABS tablet Take 5 mg by mouth 2 (two) times daily.  Marland Kitchen atorvastatin (LIPITOR) 10 MG tablet Take 10 mg by mouth daily.  Marland Kitchen azaTHIOprine (IMURAN) 50 MG tablet 2 tabs po bid  . carvedilol (COREG) 6.25 MG tablet Take 6.25 mg by mouth 2 (two) times daily with a meal.  . escitalopram (LEXAPRO) 10 MG tablet Take 10 mg by mouth daily.  . furosemide (LASIX) 40 MG tablet   . gabapentin (NEURONTIN) 300 MG capsule Take 300 mg by mouth 3 (three) times daily.  Marland Kitchen HYDROcodone-acetaminophen (NORCO) 10-325 MG per tablet Take 1 tablet by mouth. Take one tab 4 times daily.  May take 1 additional tablet if needed.  Marland Kitchen HYDROcodone-acetaminophen (NORCO/VICODIN) 5-325 MG tablet   . NITROSTAT 0.4 MG SL tablet   . OLANZapine (ZYPREXA) 5 MG tablet Take 1 tablet (5 mg total) by mouth daily.  Marland Kitchen omeprazole-sodium bicarbonate (ZEGERID) 40-1100 MG per capsule Take 1 capsule by mouth daily before breakfast.  . spironolactone-hydrochlorothiazide (ALDACTAZIDE) 25-25 MG per tablet   . torsemide (DEMADEX) 20 MG tablet    Allergies  Allergen Reactions  . Acetaminophen Other (See Comments)    LIVER INFLAMMATION IN HIGH DOSES  . Lasix [  Furosemide] Other (See Comments)    Could not function / could not get out of bed.   PMHx:   Past Medical History:  Diagnosis Date  . Anemia   . Anxiety   . Arthritis    knees  . Benign prostatic hypertrophy with urinary obstruction   . Blood transfusion 1 yr. ago   jerking,fever-after blood transfusion  . Blood transfusion    given wrong blood  . Bradycardia    hx of bradycardia in past per ov note of 12/12  . CAS (cerebral atherosclerosis)   . Coronary artery disease 1998   cardiac stent/ Clearance Dr Arnoldo Morale and Einar Gip with note on chart  . Depression    takes Zoloft  . Dysrhythmia    hx of atrial fib per ov note of 12/12   . Erectile dysfunction   .  Family history of early CAD    male 1st degree relative <50  . GERD (gastroesophageal reflux disease)   . HA (headache)    sinus headaches  . Hyperlipidemia   . Hypertension   . Low back pain   . Myocardial infarction 1997   Hx MI 1997 and 1998  . Neuropathy, autonomic, idiopathic peripheral, other   . Peptic ulcer disease   . Pneumonia 2005  . Preoperative cardiovascular examination   . Preventative health care   . Sleep apnea, obstructive    CPAP-does not use  . Spinal stenosis, lumbar   . Superficial spreading melanoma    Immunization History  Administered Date(s) Administered  . Influenza Split 11/07/2011  . Influenza Whole 02/18/1997, 11/24/2006, 12/19/2008  . Influenza, High Dose Seasonal PF 11/19/2012  . Pneumococcal Conjugate-13 01/01/2013  . Td 02/19/1995, 09/25/2007  . Tdap 11/23/2012   Past Surgical History:  Procedure Laterality Date  . BACK SURGERY  2005-last   lumbar x2  . COLONOSCOPY  2004  . [place]   . CORONARY STENT PLACEMENT    . decompression of the median nerve     left wrist and hand  . ESOPHAGOGASTRODUODENOSCOPY  2004  . EXCISIONAL TOTAL KNEE ARTHROPLASTY  12/25/2010   Procedure: EXCISIONAL TOTAL KNEE ARTHROPLASTY;  Surgeon: Mauri Pole;  Location: [facility];  Service: Orthopedics;  Laterality: Right;  repeat irrigation   and debridement, removal and reinsertation of spacer block  . I&D EXTREMITY  03/25/2011   Procedure: IRRIGATION AND DEBRIDEMENT EXTREMITY;  Surgeon: Mauri Pole, MD;  Location: [facility];  Service: Orthopedics;  Laterality: Right;  . JOINT REPLACEMENT  2011   left knee x 2 ; 08/2010-right knee-infected  . KNEE ARTHROSCOPY     left  . KNEE ARTHROSCOPY     right  . LUMBAR FUSION    . LUMBAR LAMINECTOMY    . revision of tkr  2011   on left  . SHOULDER ARTHROSCOPY     right  . TOTAL KNEE REVISION  05/13/2011   Procedure: TOTAL KNEE REVISION;  Surgeon: Mauri Pole, MD;  Location: [facility];   Service: Orthopedics;  Laterality: Right;  Reimplantation   FHx:    Reviewed / unchanged  SHx:    Reviewed / unchanged  Systems Review:  Constitutional: Denies fever, chills, wt changes, headaches, insomnia, fatigue, night sweats, change in appetite. Eyes: Denies redness, blurred vision, diplopia, discharge, itchy, watery eyes.  ENT: Denies discharge, congestion, post nasal drip, epistaxis, sore throat, earache, hearing loss, dental pain, tinnitus, vertigo, sinus pain, snoring.  CV: Denies chest pain, palpitations,  irregular heartbeat, syncope, dyspnea, diaphoresis, orthopnea, PND, claudication or edema. Respiratory: denies cough, dyspnea, DOE, pleurisy, hoarseness, laryngitis, wheezing.  Gastrointestinal: Denies dysphagia, odynophagia, heartburn, reflux, water brash, abdominal pain or cramps, nausea, vomiting, bloating, diarrhea, constipation, hematemesis, melena, hematochezia  or hemorrhoids. Genitourinary: Denies dysuria, urgency, nocturia, hesitancy, discharge, hematuria or flank pain. Son reports patient is having urinary frequency and nocturia 3-4 x.  Musculoskeletal: Denies arthralgias, myalgias, stiffness, jt. swelling, pain, limping or strain/sprain.  Skin: Denies pruritus, rash, hives, warts, acne, eczema or change in skin lesion(s). Neuro: No weakness, tremor, incoordination, spasms, paresthesia or pain. Psychiatric: Denies confusion, memory loss or sensory loss. Endo: Denies change in weight, skin or hair change.  Heme/Lymph: No excessive bleeding, bruising or enlarged lymph nodes.  Physical Exam BP 124/72   Pulse 72   Temp 97 F (36.1 C)   Resp 16   Ht 5\' 9"  (1.753 m)   Appears well nourished and in no distress.  Eyes: PERRLA, EOMs, conjunctiva no swelling or erythema. Sinuses: No frontal/maxillary tenderness ENT/Mouth: EAC's clear, TM's nl w/o erythema, bulging. Nares clear w/o erythema, swelling, exudates. Oropharynx clear without erythema or exudates. Oral hygiene is  good. Tongue normal, non obstructing. Hearing intact.  Neck: Supple. Thyroid nl. Car 2+/2+ without bruits, nodes or JVD. Chest: Respirations nl with BS clear & equal w/o rales, rhonchi, wheezing or stridor.  Cor: Heart sounds normal w/ sl irregular rate and rhythm with gr 2 sys murmur, no gallops, clicks, or rubs. Peripheral pulses normal and equal  without edema.  Abdomen: Soft & bowel sounds normal. Non-tender w/o guarding, rebound, hernias, masses, or organomegaly.  Lymphatics: Unremarkable.  Musculoskeletal: Full ROM all peripheral extremities, joint stability, 5/5 strength, and normal gait.  Skin: Warm, dry without exposed rashes, lesions or ecchymosis apparent.  Neuro: Cranial nerves intact, reflexes equal bilaterally. Sensory-motor testing grossly intact. Tendon reflexes grossly intact.  Pysch: Alert & oriented x 3.  Insight and judgement nl & appropriate. No ideations.  Assessment and Plan:   1. Essential hypertension  - Continue medication, monitor blood pressure at home. Continue DASH diet. Reminder to go to the ER if any CP, SOB, nausea, dizziness, severe HA, changes vision/speech, left arm numbness and tingling and jaw pain. - TSH  2. Mixed hyperlipidemia  - Continue diet/meds, exercise,& lifestyle modifications. Continue monitor periodic cholesterol/liver & renal functions - Lipid panel - TSH  3. Prediabetes  - Continue diet, exercise, lifestyle modifications. Monitor appropriate labs. - Hemoglobin A1c - Insulin, random  4. Vitamin D deficiency  - Continue supplementation. - VITAMIN D 25 Hydroxy   5. Chronic atrial fibrillation (HCC)   6. Lethargy  - TSH  7. Acute cystitis without hematuria  - Urinalysis, Routine w reflex microscopic  - Urine culture  8. Medication management  - CBC with Differential/Platelet - BASIC METABOLIC PANEL WITH GFR - Hepatic function panel - Magnesium      Recommended regular exercise, BP monitoring, weight control, and  discussed med and SE's. Recommended labs to assess and monitor clinical status. Further disposition pending results of labs. Over 30 minutes of exam, counseling, chart review was performed

## 2015-12-02 ENCOUNTER — Encounter: Payer: Self-pay | Admitting: Internal Medicine

## 2015-12-02 LAB — HEMOGLOBIN A1C
HEMOGLOBIN A1C: 5.6 % (ref <5.7)
Mean Plasma Glucose: 114 mg/dL

## 2015-12-02 LAB — INSULIN, RANDOM: INSULIN: 18.8 u[IU]/mL (ref 2.0–19.6)

## 2015-12-02 LAB — VITAMIN D 25 HYDROXY (VIT D DEFICIENCY, FRACTURES): VIT D 25 HYDROXY: 20 ng/mL — AB (ref 30–100)

## 2015-12-03 LAB — URINE CULTURE: Organism ID, Bacteria: NO GROWTH

## 2015-12-06 DIAGNOSIS — Z79899 Other long term (current) drug therapy: Secondary | ICD-10-CM | POA: Diagnosis not present

## 2015-12-06 DIAGNOSIS — G894 Chronic pain syndrome: Secondary | ICD-10-CM | POA: Diagnosis not present

## 2015-12-06 DIAGNOSIS — Z79891 Long term (current) use of opiate analgesic: Secondary | ICD-10-CM | POA: Diagnosis not present

## 2015-12-06 DIAGNOSIS — M961 Postlaminectomy syndrome, not elsewhere classified: Secondary | ICD-10-CM | POA: Diagnosis not present

## 2015-12-06 DIAGNOSIS — M47817 Spondylosis without myelopathy or radiculopathy, lumbosacral region: Secondary | ICD-10-CM | POA: Diagnosis not present

## 2015-12-06 DIAGNOSIS — M1288 Other specific arthropathies, not elsewhere classified, other specified site: Secondary | ICD-10-CM | POA: Diagnosis not present

## 2015-12-11 DIAGNOSIS — I5032 Chronic diastolic (congestive) heart failure: Secondary | ICD-10-CM | POA: Diagnosis not present

## 2015-12-11 DIAGNOSIS — I482 Chronic atrial fibrillation: Secondary | ICD-10-CM | POA: Diagnosis not present

## 2015-12-11 DIAGNOSIS — R600 Localized edema: Secondary | ICD-10-CM | POA: Diagnosis not present

## 2015-12-11 DIAGNOSIS — I10 Essential (primary) hypertension: Secondary | ICD-10-CM | POA: Diagnosis not present

## 2016-01-23 ENCOUNTER — Encounter: Payer: Self-pay | Admitting: Internal Medicine

## 2016-01-23 ENCOUNTER — Ambulatory Visit (INDEPENDENT_AMBULATORY_CARE_PROVIDER_SITE_OTHER): Payer: Medicare Other | Admitting: Internal Medicine

## 2016-01-23 VITALS — BP 108/60 | HR 78 | Temp 98.0°F | Resp 16 | Ht 69.0 in

## 2016-01-23 DIAGNOSIS — R7303 Prediabetes: Secondary | ICD-10-CM

## 2016-01-23 DIAGNOSIS — R270 Ataxia, unspecified: Secondary | ICD-10-CM

## 2016-01-23 DIAGNOSIS — M545 Low back pain: Secondary | ICD-10-CM

## 2016-01-23 DIAGNOSIS — I2510 Atherosclerotic heart disease of native coronary artery without angina pectoris: Secondary | ICD-10-CM | POA: Diagnosis not present

## 2016-01-23 DIAGNOSIS — Z0001 Encounter for general adult medical examination with abnormal findings: Secondary | ICD-10-CM | POA: Diagnosis not present

## 2016-01-23 DIAGNOSIS — G6289 Other specified polyneuropathies: Secondary | ICD-10-CM

## 2016-01-23 DIAGNOSIS — Z96651 Presence of right artificial knee joint: Secondary | ICD-10-CM

## 2016-01-23 DIAGNOSIS — R6889 Other general symptoms and signs: Secondary | ICD-10-CM | POA: Diagnosis not present

## 2016-01-23 DIAGNOSIS — L97921 Non-pressure chronic ulcer of unspecified part of left lower leg limited to breakdown of skin: Secondary | ICD-10-CM | POA: Diagnosis not present

## 2016-01-23 DIAGNOSIS — G4733 Obstructive sleep apnea (adult) (pediatric): Secondary | ICD-10-CM | POA: Diagnosis not present

## 2016-01-23 DIAGNOSIS — M21371 Foot drop, right foot: Secondary | ICD-10-CM

## 2016-01-23 DIAGNOSIS — R29898 Other symptoms and signs involving the musculoskeletal system: Secondary | ICD-10-CM | POA: Diagnosis not present

## 2016-01-23 DIAGNOSIS — G609 Hereditary and idiopathic neuropathy, unspecified: Secondary | ICD-10-CM

## 2016-01-23 DIAGNOSIS — Z79899 Other long term (current) drug therapy: Secondary | ICD-10-CM

## 2016-01-23 DIAGNOSIS — I482 Chronic atrial fibrillation, unspecified: Secondary | ICD-10-CM

## 2016-01-23 DIAGNOSIS — G6181 Chronic inflammatory demyelinating polyneuritis: Secondary | ICD-10-CM | POA: Diagnosis not present

## 2016-01-23 DIAGNOSIS — K219 Gastro-esophageal reflux disease without esophagitis: Secondary | ICD-10-CM | POA: Diagnosis not present

## 2016-01-23 DIAGNOSIS — I10 Essential (primary) hypertension: Secondary | ICD-10-CM

## 2016-01-23 DIAGNOSIS — Z0000 Encounter for general adult medical examination without abnormal findings: Secondary | ICD-10-CM

## 2016-01-23 DIAGNOSIS — M21372 Foot drop, left foot: Secondary | ICD-10-CM

## 2016-01-23 DIAGNOSIS — E559 Vitamin D deficiency, unspecified: Secondary | ICD-10-CM

## 2016-01-23 DIAGNOSIS — G8929 Other chronic pain: Secondary | ICD-10-CM

## 2016-01-23 DIAGNOSIS — E782 Mixed hyperlipidemia: Secondary | ICD-10-CM

## 2016-01-23 MED ORDER — ESCITALOPRAM OXALATE 20 MG PO TABS: 20.0000 mg | ORAL_TABLET | Freq: Every day | ORAL | 0 refills | Status: DC

## 2016-01-23 MED ORDER — MUPIROCIN 2 % EX OINT: 1.0000 "application " | TOPICAL_OINTMENT | Freq: Every day | CUTANEOUS | 0 refills | Status: DC

## 2016-01-23 NOTE — Progress Notes (Signed)
MEDICARE ANNUAL WELLNESS VISIT AND FOLLOW UP Assessment:    1. Skin ulcer of left lower leg, limited to breakdown of skin ([place]) -bactroban -dressed today with tegaderm -family to leave dressed until home health arrives -no need for oral abx currently - Ambulatory referral to [facility]  2. ASHD (arteriosclerotic heart disease) -cont statin -cont BP control -cont CBG control  3. Chronic atrial fibrillation (HCC) -on eliquis -cont rate control -followed by cards  4. Essential hypertension -cont meds -dash diet -exercise as tolerated -monitor at home -goal 140/85 or less  5. Obstructive sleep apnea -cont CPAP use  6. Gastroesophageal reflux disease, esophagitis presence not specified -cont meds -avoid trigger foods  7. Axonal sensorimotor neuropathy ([place]) -followed by neuro  8. CIDP (chronic inflammatory demyelinating polyneuropathy) (HCC) -followed by neuro  9. Hereditary and idiopathic peripheral neuropathy -followed by neuro  10. Weakness of both lower extremities -followed by neuro -will not accept PT  11. Ataxia -high fall risk -follows with neuro for the above  12. Chronic low back pain, unspecified back pain laterality, with sciatica presence unspecified -followed by ortho  13. Foot drop, bilateral -followed by neuro -patient aware to use walker  14. Mixed hyperlipidemia -cont statin -cont diet and exercise as tolerated  15. Medication management   16. Prediabetes -cont diet and exercise -monitor a1c  17. Status post revision of total replacement of right knee -followed by ortho  18. Vitamin D deficiency -cont Vit D   Over 30 minutes of exam, counseling, chart review, and critical decision making was performed  Future Appointments Date Time Provider [place]  03/07/2016 11:00 AM Starlyn Skeans, PA-C [facility] None  06/28/2016 9:00 AM Unk Pinto, MD [facility] None     Plan:   During the course of the visit  the patient was educated and counseled about appropriate screening and preventive services including:    Pneumococcal vaccine   Influenza vaccine  Prevnar 13  Td vaccine  Screening electrocardiogram  Colorectal cancer screening  Diabetes screening  Glaucoma screening  Nutrition counseling    Subjective:  [name] is a 75 y.o. male who presents for Medicare Annual Wellness Visit and 3 month follow up for a leg wound which has been going on for the last couple weeks.  He reports that he has had some thick drainage.  Mild redness.  No fevers, chills, nausea, or vomiting. He has had some increase in the size of the wound. He has been leaving it open.  His wife has been putting some kenalog cream on there.    Medication Review: Current Outpatient Prescriptions on File Prior to Visit  Medication Sig Dispense Refill  . apixaban (ELIQUIS) 5 MG TABS tablet Take 5 mg by mouth 2 (two) times daily.    Marland Kitchen atorvastatin (LIPITOR) 10 MG tablet Take 10 mg by mouth daily.    Marland Kitchen azaTHIOprine (IMURAN) 50 MG tablet 2 tabs po bid 120 tablet 6  . carvedilol (COREG) 6.25 MG tablet Take 6.25 mg by mouth 2 (two) times daily with a meal.    . escitalopram (LEXAPRO) 10 MG tablet Take 10 mg by mouth daily.    . furosemide (LASIX) 40 MG tablet     . gabapentin (NEURONTIN) 300 MG capsule Take 300 mg by mouth 3 (three) times daily.    Marland Kitchen HYDROcodone-acetaminophen (NORCO) 10-325 MG per tablet Take 1 tablet by mouth. Take one tab 4 times daily.  May take 1 additional tablet if needed.    Marland Kitchen HYDROcodone-acetaminophen (  NORCO/VICODIN) 5-325 MG tablet   0  . NITROSTAT 0.4 MG SL tablet     . OLANZapine (ZYPREXA) 5 MG tablet Take 1 tablet (5 mg total) by mouth daily. 90 tablet 1  . omeprazole-sodium bicarbonate (ZEGERID) 40-1100 MG per capsule Take 1 capsule by mouth daily before breakfast. 90 capsule 3  . spironolactone-hydrochlorothiazide (ALDACTAZIDE) 25-25 MG per tablet     . torsemide (DEMADEX) 20 MG tablet    2  . [DISCONTINUED] zolpidem (AMBIEN) 5 MG tablet Take 1 tablet (5 mg total) by mouth at bedtime as needed for sleep. 20 tablet 0   No current facility-administered medications on file prior to visit.     Allergies: Allergies  Allergen Reactions  . Acetaminophen Other (See Comments)    LIVER INFLAMMATION IN HIGH DOSES  . Lasix [Furosemide] Other (See Comments)    Could not function / could not get out of bed.    Current Problems (verified) has SLEEP APNEA, OBSTRUCTIVE, MODERATE; Essential hypertension; S/P right TK revision; Chronic low back pain; Foot drop, bilateral; Sacroiliac joint disease; Atrial fibrillation ([place]); Extremity muscle atrophy; Hereditary and idiopathic peripheral neuropathy; Ataxia; Axonal sensorimotor neuropathy (HCC); CIDP (chronic inflammatory demyelinating polyneuropathy) ([place]); Hyperlipidemia; Prediabetes; Vitamin D deficiency; Medication management; GERD (gastroesophageal reflux disease); and ASHD (arteriosclerotic heart disease) on his problem list.  Screening Tests Immunization History  Administered Date(s) Administered  . Influenza Split 11/07/2011  . Influenza Whole 02/18/1997, 11/24/2006, 12/19/2008  . Influenza, High Dose Seasonal PF 11/19/2012  . Pneumococcal Conjugate-13 01/01/2013  . Td 02/19/1995, 09/25/2007  . Tdap 11/23/2012    Preventative care: Last colonoscopy: Declines further  Names of Other Physician/Practitioners you currently use: 1. [facility] here for primary care 2. Dr. Bing Plume, eye doctor, last visit 2017 3. [facility], dentist, last visit  Patient Care Team: Unk Pinto, MD as PCP - General (Internal Medicine)  Surgical: He  has a past surgical history that includes Esophagogastroduodenoscopy (2004); Colonoscopy (2004); Lumbar laminectomy; Lumbar fusion; Knee arthroscopy; Shoulder arthroscopy; Knee arthroscopy; decompression of the median nerve; revision of tkr (2011); Coronary stent  placement; Back surgery (2005-last); Excisional total knee arthroplasty (12/25/2010); Joint replacement (2011); I&D extremity (03/25/2011); Coronary angioplasty (1998); and Total knee revision (05/13/2011). Family His family history includes Breast cancer in his sister and sister; Diabetes in his sister; Heart disease in his brother and father; Stroke in his mother. Social history  He reports that he quit smoking about 6 years ago. His smoking use included Cigars. He has a 15.00 pack-year smoking history. He has never used smokeless tobacco. He reports that he does not drink alcohol or use drugs.  MEDICARE WELLNESS OBJECTIVES: Physical activity:   Cardiac risk factors:   Depression/mood screen:   Depression screen [facility] 2/9 12/02/2015  Decreased Interest 0  Down, Depressed, Hopeless 0  PHQ - 2 Score 0  Altered sleeping -  Tired, decreased energy -  Change in appetite -  Feeling bad or failure about yourself  -  Trouble concentrating -  Moving slowly or fidgety/restless -  Suicidal thoughts -  PHQ-9 Score -  Some recent data might be hidden    ADLs:  In your present state of health, do you have any difficulty performing the following activities: 12/02/2015  Hearing? N  Vision? N  Difficulty concentrating or making decisions? N  Walking or climbing stairs? N  Dressing or bathing? N  Doing errands, shopping? N  Some recent data might be hidden     Cognitive Testing  Alert? Yes  Normal Appearance?Yes  Oriented to person? Yes  Place? Yes   Time? Yes  Recall of three objects?  Yes  Can perform simple calculations? Yes  Displays appropriate judgment?Yes  Can read the correct time from a watch face?Yes  EOL planning:     Objective:   Today's Vitals   01/23/16 1514  BP: 108/60  Pulse: 78  Resp: 16  Temp: 98 F (36.7 C)  TempSrc: Temporal  Height: 5\' 9"  (1.753 m)   There is no height or weight on file to calculate BMI.  General appearance: alert, no distress, WD/WN,  male HEENT: normocephalic, sclerae anicteric, TMs pearly, nares patent, no discharge or erythema, pharynx normal Oral cavity: MMM, no lesions Neck: supple, no lymphadenopathy, no thyromegaly, no masses Heart: RRR, normal S1, S2, no murmurs Lungs: CTA bilaterally, no wheezes, rhonchi, or rales Abdomen: +bs, soft, non tender, non distended, no masses, no hepatomegaly, no splenomegaly Musculoskeletal: nontender, no swelling, no obvious deformity Extremities: no edema, no cyanosis, no clubbing Pulses: 2+ symmetric, upper and lower extremities, normal cap refill Neurological: alert, oriented x 3, CN2-12 intact, strength normal upper extremities and lower extremities, sensation normal throughout, DTRs 2+ throughout, no cerebellar signs, gait normal Psychiatric: normal affect, behavior normal, pleasant  SKIN:  4 cm x 2 cm ulcerated abrasion to the left shin with a 2 cm x 2 cm scab to the lateral portion of the wound.  No drainage.  No palpable warmth or surrounding redness.  Mildly tender to palpation.    Medicare Attestation I have personally reviewed: The patient's medical and social history Their use of alcohol, tobacco or illicit drugs Their current medications and supplements The patient's functional ability including ADLs,fall risks, home safety risks, cognitive, and hearing and visual impairment Diet and physical activities Evidence for depression or mood disorders  The patient's weight, height, BMI, and visual acuity have been recorded in the chart.  I have made referrals, counseling, and provided education to the patient based on review of the above and I have provided the patient with a written personalized care plan for preventive services.     Starlyn Skeans, PA-C   01/23/2016

## 2016-01-29 ENCOUNTER — Telehealth: Payer: Self-pay | Admitting: *Deleted

## 2016-01-29 NOTE — Telephone Encounter (Signed)
Lisa,an RN with [facility], called and reported the patient's wound is healing well and is wearing the compression stockings and keeping legs elevated. In regard to teaching the patient about his cardiovascular meds, the patient has memory issues and his daughters manage his medication, so teaching needed at this time per Dr Melford Aase.

## 2016-01-31 DIAGNOSIS — Z79891 Long term (current) use of opiate analgesic: Secondary | ICD-10-CM | POA: Diagnosis not present

## 2016-01-31 DIAGNOSIS — M961 Postlaminectomy syndrome, not elsewhere classified: Secondary | ICD-10-CM | POA: Diagnosis not present

## 2016-01-31 DIAGNOSIS — Z79899 Other long term (current) drug therapy: Secondary | ICD-10-CM | POA: Diagnosis not present

## 2016-01-31 DIAGNOSIS — M47817 Spondylosis without myelopathy or radiculopathy, lumbosacral region: Secondary | ICD-10-CM | POA: Diagnosis not present

## 2016-01-31 DIAGNOSIS — M545 Low back pain: Secondary | ICD-10-CM | POA: Diagnosis not present

## 2016-01-31 DIAGNOSIS — G894 Chronic pain syndrome: Secondary | ICD-10-CM | POA: Diagnosis not present

## 2016-03-07 ENCOUNTER — Ambulatory Visit: Payer: Self-pay | Admitting: Internal Medicine

## 2016-03-09 ENCOUNTER — Encounter (HOSPITAL_BASED_OUTPATIENT_CLINIC_OR_DEPARTMENT_OTHER): Payer: Self-pay | Admitting: Emergency Medicine

## 2016-03-09 DIAGNOSIS — I10 Essential (primary) hypertension: Secondary | ICD-10-CM | POA: Diagnosis not present

## 2016-03-09 DIAGNOSIS — M549 Dorsalgia, unspecified: Secondary | ICD-10-CM | POA: Diagnosis not present

## 2016-03-09 DIAGNOSIS — Z79899 Other long term (current) drug therapy: Secondary | ICD-10-CM | POA: Insufficient documentation

## 2016-03-09 DIAGNOSIS — Z5321 Procedure and treatment not carried out due to patient leaving prior to being seen by health care provider: Secondary | ICD-10-CM | POA: Insufficient documentation

## 2016-03-09 DIAGNOSIS — Z87891 Personal history of nicotine dependence: Secondary | ICD-10-CM | POA: Insufficient documentation

## 2016-03-09 NOTE — ED Triage Notes (Signed)
Patient is on chronic pain medications. Was unable to get pills filled and has been without his pain medication since noon. Patient is rocking back and forth in the chair in pain

## 2016-03-10 ENCOUNTER — Emergency Department (HOSPITAL_BASED_OUTPATIENT_CLINIC_OR_DEPARTMENT_OTHER)
Admission: EM | Admit: 2016-03-10 | Discharge: 2016-03-10 | Disposition: A | Discharge status: Home / Self Care | Payer: Medicare Other | Attending: Dermatology | Admitting: Dermatology

## 2016-03-10 DIAGNOSIS — G8929 Other chronic pain: Secondary | ICD-10-CM | POA: Diagnosis not present

## 2016-03-10 DIAGNOSIS — I509 Heart failure, unspecified: Secondary | ICD-10-CM | POA: Diagnosis not present

## 2016-03-10 DIAGNOSIS — I10 Essential (primary) hypertension: Secondary | ICD-10-CM | POA: Diagnosis not present

## 2016-03-10 DIAGNOSIS — Z79899 Other long term (current) drug therapy: Secondary | ICD-10-CM | POA: Diagnosis not present

## 2016-03-10 DIAGNOSIS — M5442 Lumbago with sciatica, left side: Secondary | ICD-10-CM | POA: Diagnosis not present

## 2016-03-10 DIAGNOSIS — M5441 Lumbago with sciatica, right side: Secondary | ICD-10-CM | POA: Diagnosis not present

## 2016-03-10 DIAGNOSIS — I2510 Atherosclerotic heart disease of native coronary artery without angina pectoris: Secondary | ICD-10-CM | POA: Diagnosis not present

## 2016-03-10 DIAGNOSIS — M545 Low back pain: Secondary | ICD-10-CM | POA: Diagnosis not present

## 2016-03-10 DIAGNOSIS — Z7901 Long term (current) use of anticoagulants: Secondary | ICD-10-CM | POA: Diagnosis not present

## 2016-03-10 NOTE — ED Notes (Signed)
Registration reports pt left the building.

## 2016-03-27 DIAGNOSIS — M961 Postlaminectomy syndrome, not elsewhere classified: Secondary | ICD-10-CM | POA: Diagnosis not present

## 2016-03-27 DIAGNOSIS — Z79891 Long term (current) use of opiate analgesic: Secondary | ICD-10-CM | POA: Diagnosis not present

## 2016-03-27 DIAGNOSIS — G894 Chronic pain syndrome: Secondary | ICD-10-CM | POA: Diagnosis not present

## 2016-03-27 DIAGNOSIS — M47817 Spondylosis without myelopathy or radiculopathy, lumbosacral region: Secondary | ICD-10-CM | POA: Diagnosis not present

## 2016-03-27 DIAGNOSIS — Z79899 Other long term (current) drug therapy: Secondary | ICD-10-CM | POA: Diagnosis not present

## 2016-04-10 DIAGNOSIS — M47817 Spondylosis without myelopathy or radiculopathy, lumbosacral region: Secondary | ICD-10-CM | POA: Diagnosis not present

## 2016-04-22 ENCOUNTER — Other Ambulatory Visit: Payer: Self-pay | Admitting: Internal Medicine

## 2016-04-24 DIAGNOSIS — M47817 Spondylosis without myelopathy or radiculopathy, lumbosacral region: Secondary | ICD-10-CM | POA: Diagnosis not present

## 2016-05-16 ENCOUNTER — Encounter: Payer: Self-pay | Admitting: Internal Medicine

## 2016-05-22 DIAGNOSIS — G894 Chronic pain syndrome: Secondary | ICD-10-CM | POA: Diagnosis not present

## 2016-05-22 DIAGNOSIS — Z79899 Other long term (current) drug therapy: Secondary | ICD-10-CM | POA: Diagnosis not present

## 2016-05-22 DIAGNOSIS — M47817 Spondylosis without myelopathy or radiculopathy, lumbosacral region: Secondary | ICD-10-CM | POA: Diagnosis not present

## 2016-05-22 DIAGNOSIS — Z79891 Long term (current) use of opiate analgesic: Secondary | ICD-10-CM | POA: Diagnosis not present

## 2016-05-22 DIAGNOSIS — M961 Postlaminectomy syndrome, not elsewhere classified: Secondary | ICD-10-CM | POA: Diagnosis not present

## 2016-05-22 DIAGNOSIS — M545 Low back pain: Secondary | ICD-10-CM | POA: Diagnosis not present

## 2016-06-19 DIAGNOSIS — Z79899 Other long term (current) drug therapy: Secondary | ICD-10-CM | POA: Diagnosis not present

## 2016-06-19 DIAGNOSIS — M47817 Spondylosis without myelopathy or radiculopathy, lumbosacral region: Secondary | ICD-10-CM | POA: Diagnosis not present

## 2016-06-19 DIAGNOSIS — M545 Low back pain: Secondary | ICD-10-CM | POA: Diagnosis not present

## 2016-06-19 DIAGNOSIS — Z79891 Long term (current) use of opiate analgesic: Secondary | ICD-10-CM | POA: Diagnosis not present

## 2016-06-19 DIAGNOSIS — G894 Chronic pain syndrome: Secondary | ICD-10-CM | POA: Diagnosis not present

## 2016-06-19 DIAGNOSIS — M961 Postlaminectomy syndrome, not elsewhere classified: Secondary | ICD-10-CM | POA: Diagnosis not present

## 2016-06-27 DIAGNOSIS — M75100 Unspecified rotator cuff tear or rupture of unspecified shoulder, not specified as traumatic: Secondary | ICD-10-CM | POA: Diagnosis not present

## 2016-06-27 DIAGNOSIS — M19019 Primary osteoarthritis, unspecified shoulder: Secondary | ICD-10-CM | POA: Diagnosis not present

## 2016-06-28 ENCOUNTER — Encounter: Payer: Self-pay | Admitting: Internal Medicine

## 2016-06-28 ENCOUNTER — Ambulatory Visit (INDEPENDENT_AMBULATORY_CARE_PROVIDER_SITE_OTHER): Payer: Medicare Other | Admitting: Internal Medicine

## 2016-06-28 VITALS — BP 126/80 | HR 80 | Temp 97.5°F | Resp 16 | Ht 69.0 in | Wt 210.0 lb

## 2016-06-28 DIAGNOSIS — I482 Chronic atrial fibrillation, unspecified: Secondary | ICD-10-CM

## 2016-06-28 DIAGNOSIS — R7303 Prediabetes: Secondary | ICD-10-CM

## 2016-06-28 DIAGNOSIS — Z23 Encounter for immunization: Secondary | ICD-10-CM | POA: Diagnosis not present

## 2016-06-28 DIAGNOSIS — Z136 Encounter for screening for cardiovascular disorders: Secondary | ICD-10-CM

## 2016-06-28 DIAGNOSIS — Z0001 Encounter for general adult medical examination with abnormal findings: Secondary | ICD-10-CM

## 2016-06-28 DIAGNOSIS — Z0000 Encounter for general adult medical examination without abnormal findings: Secondary | ICD-10-CM

## 2016-06-28 DIAGNOSIS — D519 Vitamin B12 deficiency anemia, unspecified: Secondary | ICD-10-CM | POA: Diagnosis not present

## 2016-06-28 DIAGNOSIS — Z79899 Other long term (current) drug therapy: Secondary | ICD-10-CM

## 2016-06-28 DIAGNOSIS — E782 Mixed hyperlipidemia: Secondary | ICD-10-CM

## 2016-06-28 DIAGNOSIS — Z125 Encounter for screening for malignant neoplasm of prostate: Secondary | ICD-10-CM

## 2016-06-28 DIAGNOSIS — E559 Vitamin D deficiency, unspecified: Secondary | ICD-10-CM | POA: Diagnosis not present

## 2016-06-28 DIAGNOSIS — K219 Gastro-esophageal reflux disease without esophagitis: Secondary | ICD-10-CM

## 2016-06-28 DIAGNOSIS — I10 Essential (primary) hypertension: Secondary | ICD-10-CM | POA: Diagnosis not present

## 2016-06-28 DIAGNOSIS — Z1212 Encounter for screening for malignant neoplasm of rectum: Secondary | ICD-10-CM

## 2016-06-28 DIAGNOSIS — I2510 Atherosclerotic heart disease of native coronary artery without angina pectoris: Secondary | ICD-10-CM

## 2016-06-28 LAB — BASIC METABOLIC PANEL WITH GFR
BUN: 34 mg/dL — ABNORMAL HIGH (ref 7–25)
CALCIUM: 9.7 mg/dL (ref 8.6–10.3)
CO2: 18 mmol/L — AB (ref 20–31)
CREATININE: 1.6 mg/dL — AB (ref 0.70–1.18)
Chloride: 97 mmol/L — ABNORMAL LOW (ref 98–110)
GFR, Est African American: 47 mL/min — ABNORMAL LOW (ref >=60)
GFR, Est Non African American: 41 mL/min — ABNORMAL LOW (ref >=60)
GLUCOSE: 205 mg/dL — AB (ref 65–99)
Potassium: 4.6 mmol/L (ref 3.5–5.3)
SODIUM: 131 mmol/L — AB (ref 135–146)

## 2016-06-28 LAB — HEPATIC FUNCTION PANEL
ALBUMIN: 4.2 g/dL (ref 3.6–5.1)
ALT: 12 U/L (ref 9–46)
AST: 18 U/L (ref 10–35)
Alkaline Phosphatase: 114 U/L (ref 40–115)
BILIRUBIN DIRECT: 0.1 mg/dL (ref <=0.2)
Indirect Bilirubin: 0.4 mg/dL (ref 0.2–1.2)
TOTAL PROTEIN: 7.5 g/dL (ref 6.1–8.1)
Total Bilirubin: 0.5 mg/dL (ref 0.2–1.2)

## 2016-06-28 LAB — CBC WITH DIFFERENTIAL/PLATELET
Basophils Absolute: 0 cells/uL (ref 0–200)
Basophils Relative: 0 %
EOS PCT: 0 %
Eosinophils Absolute: 0 cells/uL — ABNORMAL LOW (ref 15–500)
HCT: 34.3 % — ABNORMAL LOW (ref 38.5–50.0)
HEMOGLOBIN: 10.6 g/dL — AB (ref 13.2–17.1)
LYMPHS ABS: 1044 {cells}/uL (ref 850–3900)
Lymphocytes Relative: 9 %
MCH: 24.7 pg — AB (ref 27.0–33.0)
MCHC: 30.9 g/dL — ABNORMAL LOW (ref 32.0–36.0)
MCV: 79.8 fL — ABNORMAL LOW (ref 80.0–100.0)
MONOS PCT: 1 %
MPV: 7.7 fL (ref 7.5–12.5)
Monocytes Absolute: 116 cells/uL — ABNORMAL LOW (ref 200–950)
NEUTROS PCT: 90 %
Neutro Abs: 10440 cells/uL — ABNORMAL HIGH (ref 1500–7800)
Platelets: 240 10*3/uL (ref 140–400)
RBC: 4.3 MIL/uL (ref 4.20–5.80)
RDW: 18.6 % — ABNORMAL HIGH (ref 11.0–15.0)
WBC: 11.6 10*3/uL — ABNORMAL HIGH (ref 3.8–10.8)

## 2016-06-28 LAB — LIPID PANEL
CHOLESTEROL: 114 mg/dL (ref <200)
HDL: 35 mg/dL — ABNORMAL LOW (ref >40)
LDL Cholesterol: 52 mg/dL (ref <100)
Total CHOL/HDL Ratio: 3.3 Ratio (ref <5.0)
Triglycerides: 136 mg/dL (ref <150)
VLDL: 27 mg/dL (ref <30)

## 2016-06-28 LAB — TSH: TSH: 0.64 mIU/L (ref 0.40–4.50)

## 2016-06-28 NOTE — Progress Notes (Signed)
[facility]   Unk Pinto, M.D.      Uvaldo Bristle. Silverio Lay, P.A.-C [facility]                [address] [place] [address] Telephone [phone] Telefax [phone] Annual  Screening/Preventative Visit  & Comprehensive Evaluation & Examination     This very nice 76 y.o. MWM presents for a Screening/Preventative Visit & comprehensive evaluation and management of multiple medical co-morbidities.  Patient has been followed in this office since April 2017  for HTN, ASHD/cAfib, SDAT, Prediabetes, Hyperlipidemia and Vitamin D Deficiency. Patient ha shx/o GERD apparently controlled with diet & meds.     Patient has SDAT and a peripheral sensory neuropathy and is followed by Dr Krista Blue and also at [facility] - the latter for LBP from DJD/DDD and spinal stenosis.      HTN predates circa 1980's. Patient also has ASHD with hx/o MI x 2 (1997 & 1998) with a PCA/Stent in 2012 by Dr Nadyne Coombes.  Patient's BP has been controlled at home.  Today's BP is at goal - 126/80. Patient denies any cardiac symptoms as chest pain, palpitations, shortness of breath, dizziness or ankle swelling.     Patient's hyperlipidemia is controlled with diet and medications. Patient denies myalgias or other medication SE's. Last lipids were at goal albeit sl elevated Trig's: Lab Results  Component Value Date   CHOL 110 (L) 12/01/2015   HDL 29 (L) 12/01/2015   LDLCALC 44 12/01/2015   LDLDIRECT 59.1 03/28/2010   TRIG 183 (H) 12/01/2015   CHOLHDL 3.8 12/01/2015      Patient has prediabetes with A1c 6.1% in Apr 2017 and patient denies reactive hypoglycemic symptoms, visual blurring, diabetic polys or paresthesias. Last A1c was at goal:  Lab Results  Component Value Date   HGBA1C 5.6 12/01/2015       Finally, patient has history of Vitamin D Deficiency of    and last vitamin D was still low: Lab Results  Component Value Date    VD25OH 20 (L) 12/01/2015   Current Outpatient Prescriptions on File Prior to Visit  Medication Sig  . apixaban (ELIQUIS) 5 MG  Take  2 x  daily.  Marland Kitchen atorvastatin  10 MG  Take  daily.  . carvedilol 6.25 MG  Take  2 x  daily   . escitalopram  20 MG  TAKE 1 TAB DAILY.  Marland Kitchen gabapentin  300 MG  Take  3 x  daily.  Marland Kitchen VICODIN 5-325 MG  Takes 4 x / day from pain  clinic  . mupirocin oint  2 % Apply 1 application topically daily.  Marland Kitchen NITROSTAT 0.4 MG SL  As needed  . OLANZapine (ZYPREXA) 5 MG Take 1 tab daily.  Marland Kitchen ZEGERID 40-1100 MG  Take 1 cap daily before breakfast.  . torsemide  20 MG tablet Takes daily   Allergies  Allergen Reactions  . Acetaminophen Other (See Comments)    LIVER INFLAMMATION IN HIGH DOSES  . Lasix [Furosemide] Other (See Comments)    Could not function / could not get out of bed.   Past Medical History:  Diagnosis Date  . Anemia   . Anxiety   . Arthritis    knees  . Benign prostatic hypertrophy with urinary obstruction   . Blood transfusion 1 yr. ago   jerking,fever-after blood transfusion  .  Blood transfusion    given wrong blood  . Bradycardia    hx of bradycardia in past per ov note of 12/12  . CAS (cerebral atherosclerosis)   . Coronary artery disease 1998   cardiac stent/ Clearance Dr Arnoldo Morale and Einar Gip with note on chart  . Depression    takes Zoloft  . Dysrhythmia    hx of atrial fib per ov note of 12/12   . Erectile dysfunction   . Family history of early CAD    male 1st degree relative <50  . GERD (gastroesophageal reflux disease)   . HA (headache)    sinus headaches  . Hyperlipidemia   . Hypertension   . Low back pain   . Myocardial infarction 1997   Hx MI 1997 and 1998  . Neuropathy, autonomic, idiopathic peripheral, other   . Peptic ulcer disease   . Pneumonia 2005  . Preoperative cardiovascular examination   . Preventative health care   . Sleep apnea, obstructive    CPAP-does not use  . Spinal stenosis, lumbar   . Superficial spreading  melanoma    Health Maintenance  Topic Date Due  . PNA vac Low Risk Adult (2 of 2 - PPSV23) 01/01/2014  . INFLUENZA VACCINE  09/18/2016  . TETANUS/TDAP  11/24/2022   Immunization History  Administered Date(s) Administered  . Influenza Split 11/07/2011  . Influenza Whole 02/18/1997, 11/24/2006, 12/19/2008  . Influenza, High Dose Seasonal PF 11/19/2012  . Pneumococcal Conjugate-13 01/01/2013  . Td 02/19/1995, 09/25/2007  . Tdap 11/23/2012   Past Surgical History:  Procedure Laterality Date  . BACK SURGERY  2005-last   lumbar x2  . COLONOSCOPY  2004  . [place]   . CORONARY STENT PLACEMENT    . decompression of the median nerve     left wrist and hand  . ESOPHAGOGASTRODUODENOSCOPY  2004  . EXCISIONAL TOTAL KNEE ARTHROPLASTY  12/25/2010   Procedure: EXCISIONAL TOTAL KNEE ARTHROPLASTY;  Surgeon: Mauri Pole;  Location: [facility];  Service: Orthopedics;  Laterality: Right;  repeat irrigation   and debridement, removal and reinsertation of spacer block  . I&D EXTREMITY  03/25/2011   Procedure: IRRIGATION AND DEBRIDEMENT EXTREMITY;  Surgeon: Mauri Pole, MD;  Location: [facility];  Service: Orthopedics;  Laterality: Right;  . JOINT REPLACEMENT  2011   left knee x 2 ; 08/2010-right knee-infected  . KNEE ARTHROSCOPY     left  . KNEE ARTHROSCOPY     right  . LUMBAR FUSION    . LUMBAR LAMINECTOMY    . revision of tkr  2011   on left  . SHOULDER ARTHROSCOPY     right  . TOTAL KNEE REVISION  05/13/2011   Procedure: TOTAL KNEE REVISION;  Surgeon: Mauri Pole, MD;  Location: [facility];  Service: Orthopedics;  Laterality: Right;  Reimplantation   Family History  Problem Relation Age of Onset  . Heart disease Father   . Stroke Mother   . Diabetes Sister   . Breast cancer Sister   . Heart disease Brother   . Breast cancer Sister    Social History   Social History  . Marital status: Married    Spouse name: Vaughan Basta  . Number of children: 4  . Years of  education: HS   Occupational History  . diesel repair Retired    part time  . worked at [employer]   . 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  Smoking status: Former Smoker    Packs/day: 1.00    Years: 15.00    Types: Cigars    Quit date: 12/23/2009  . Smokeless tobacco: Never Used     Comment: smoked a pipe for 6-7 y ears  . Alcohol use No  . Drug use: No  . Sexual activity: Not on file   Social History Narrative   Caffeine 2 cups daily avg.     ROS Constitutional: Denies fever, chills, weight loss/gain, headaches, insomnia,  night sweats or change in appetite. Does c/o fatigue. Eyes: Denies redness, blurred vision, diplopia, discharge, itchy or watery eyes.  ENT: Denies discharge, congestion, post nasal drip, epistaxis, sore throat, earache, hearing loss, dental pain, Tinnitus, Vertigo, Sinus pain or snoring.  Cardio: Denies chest pain, palpitations, irregular heartbeat, syncope, dyspnea, diaphoresis, orthopnea, PND, claudication or edema Respiratory: denies cough, dyspnea, DOE, pleurisy, hoarseness, laryngitis or wheezing.  Gastrointestinal: Denies dysphagia, heartburn, reflux, water brash, pain, cramps, nausea, vomiting, bloating, diarrhea, constipation, hematemesis, melena, hematochezia, jaundice or hemorrhoids Genitourinary: Denies dysuria, frequency, urgency, nocturia, hesitancy, discharge, hematuria or flank pain Musculoskeletal: Denies arthralgia, myalgia, stiffness, Jt. Swelling, pain, limp or strain/sprain. Denies Falls. Skin: Denies puritis, rash, hives, warts, acne, eczema or change in skin lesion Neuro: No weakness, tremor, incoordination, spasms, paresthesia or pain Psychiatric: Denies confusion, memory loss or sensory loss. Denies Depression. Endocrine: Denies change in weight, skin, hair change, nocturia, and paresthesia, diabetic polys, visual blurring or hyper / hypo glycemic episodes.  Heme/Lymph: No excessive bleeding, bruising or enlarged lymph  nodes.  Physical Exam  BP 126/80   Pulse 80   Temp 97.5 F (36.4 C)   Resp 16   Ht 5\' 9"  (1.753 m)   Wt 210 lb (95.3 kg)   BMI 31.01 kg/m   General Appearance: Well nourished and well groomed and in no apparent distress.  Eyes: PERRLA, EOMs, conjunctiva no swelling or erythema, normal fundi and vessels. Sinuses: No frontal/maxillary tenderness ENT/Mouth: EACs patent / TMs  nl. Nares clear without erythema, swelling, mucoid exudates. Oral hygiene is good. No erythema, swelling, or exudate. Tongue normal, non-obstructing. Tonsils not swollen or erythematous. Hearing normal.  Neck: Supple, thyroid normal. No bruits, nodes or JVD. Respiratory: Respiratory effort normal.  BS equal and clear bilateral without rales, rhonci, wheezing or stridor. Cardio: Heart sounds are normal with regular rate and rhythm and no murmurs, rubs or gallops. Peripheral pulses are normal and equal bilaterally without edema. No aortic or femoral bruits. Chest: symmetric with normal excursions and percussion. Moderate kyphosis.  Abdomen: Soft, with Nl bowel sounds. Nontender, no guarding, rebound, hernias, masses, or organomegaly.  Lymphatics: Non tender without lymphadenopathy.  Genitourinary:  Deferred.  Musculoskeletal: Generalized decrease in muscle, power, tone & bulk and broad based gait stabilized with a walker. Skin: Warm and dry without rashes, lesions, cyanosis, clubbing or  ecchymosis.  Neuro: Cranial nerves intact, reflexes equal bilaterally. Normal muscle tone, no cerebellar symptoms. Sensation intact.  Pysch: Alert and oriented X 3 with normal affect, insight and judgment limited.   Assessment and Plan  1. Annual Preventative/Screening Exam   2. Essential hypertension  - EKG 12-Lead - [place], RETROPERITNL ABD,  LTD - Urinalysis, Routine w reflex microscopic - Microalbumin / creatinine urine ratio - CBC with Differential/Platelet - BASIC METABOLIC PANEL WITH GFR - Magnesium - TSH  3.  Hyperlipidemia, mixed  - EKG 12-Lead - [place], RETROPERITNL ABD,  LTD - Hepatic function panel - Lipid panel - TSH  4. Prediabetes  - EKG 12-Lead -  [place], RETROPERITNL ABD,  LTD - Hemoglobin A1c - Insulin, random  5. Vitamin D deficiency  - VITAMIN D 25 Hydroxy (  6. ASHD (arteriosclerotic heart disease)  - EKG 12-Lead  7. Chronic atrial fibrillation (HCC)  - EKG 12-Lead  8. Gastroesophageal reflux disease   9. Screening for rectal cancer  - POC Hemoccult Bld/Stl   10. Prostate cancer screening  - PSA  11. Screening for ischemic heart disease  - EKG 12-Lead  12. Screening for AAA (aortic abdominal aneurysm)  - [place], RETROPERITNL ABD,  LTD  13. Medication management  - Urinalysis, Routine w reflex microscopic - Microalbumin / creatinine urine ratio - CBC with Differential/Platelet - BASIC METABOLIC PANEL WITH GFR - Hepatic function panel - Magnesium - Lipid panel - TSH - Hemoglobin A1c - Insulin, random - VITAMIN D 25 Hydroxy (Vit-D Deficiency, Fractures)  14. Anemia due to vitamin B12 deficiency, unspecified B12 deficiency type  - Vitamin B12  15. Need for prophylactic vaccination against Streptococcus pneumoniae (pneumococcus)  - Pneumococcal polysaccharide vaccine 23-valent greater than or equal to 2yo subcutaneous/IM       Patient  And caretaker daughter were counseled in prudent diet, weight control to achieve/maintain BMI less than 25, BP monitoring, regular exercise and medications as discussed.  Discussed med effects and SE's. Routine screening labs and tests as requested with regular follow-up as recommended. Over 40 minutes of exam, counseling, chart review and high complex critical decision making was performed

## 2016-06-28 NOTE — Patient Instructions (Signed)

## 2016-06-29 LAB — MICROALBUMIN / CREATININE URINE RATIO
CREATININE, URINE: 20 mg/dL (ref 20–370)
MICROALB UR: 0.3 mg/dL
Microalb Creat Ratio: 15 mcg/mg creat (ref <30)

## 2016-06-29 LAB — URINALYSIS, ROUTINE W REFLEX MICROSCOPIC
Bilirubin Urine: NEGATIVE
Glucose, UA: NEGATIVE
Hgb urine dipstick: NEGATIVE
Ketones, ur: NEGATIVE
LEUKOCYTES UA: NEGATIVE
NITRITE: NEGATIVE
Protein, ur: NEGATIVE
SPECIFIC GRAVITY, URINE: 1.007 (ref 1.001–1.035)
pH: 6.5 (ref 5.0–8.0)

## 2016-06-29 LAB — MAGNESIUM: MAGNESIUM: 2 mg/dL (ref 1.5–2.5)

## 2016-06-29 LAB — HEMOGLOBIN A1C
HEMOGLOBIN A1C: 5.7 % — AB (ref <5.7)
MEAN PLASMA GLUCOSE: 117 mg/dL

## 2016-06-29 LAB — PSA: PSA: 0.3 ng/mL (ref <=4.0)

## 2016-06-29 LAB — VITAMIN B12: VITAMIN B 12: 593 pg/mL (ref 200–1100)

## 2016-06-29 LAB — VITAMIN D 25 HYDROXY (VIT D DEFICIENCY, FRACTURES): VIT D 25 HYDROXY: 43 ng/mL (ref 30–100)

## 2016-07-01 LAB — INSULIN, RANDOM: Insulin: 484.9 u[IU]/mL — ABNORMAL HIGH (ref 2.0–19.6)

## 2016-07-19 ENCOUNTER — Other Ambulatory Visit: Payer: Self-pay | Admitting: Internal Medicine

## 2016-08-15 DIAGNOSIS — R079 Chest pain, unspecified: Secondary | ICD-10-CM | POA: Diagnosis not present

## 2016-08-18 DIAGNOSIS — R0602 Shortness of breath: Secondary | ICD-10-CM | POA: Diagnosis not present

## 2016-08-18 DIAGNOSIS — M25476 Effusion, unspecified foot: Secondary | ICD-10-CM | POA: Diagnosis not present

## 2016-08-18 DIAGNOSIS — M6281 Muscle weakness (generalized): Secondary | ICD-10-CM | POA: Diagnosis not present

## 2016-08-26 DIAGNOSIS — Z79899 Other long term (current) drug therapy: Secondary | ICD-10-CM | POA: Diagnosis not present

## 2016-08-26 DIAGNOSIS — M47817 Spondylosis without myelopathy or radiculopathy, lumbosacral region: Secondary | ICD-10-CM | POA: Diagnosis not present

## 2016-08-26 DIAGNOSIS — M961 Postlaminectomy syndrome, not elsewhere classified: Secondary | ICD-10-CM | POA: Diagnosis not present

## 2016-08-26 DIAGNOSIS — Z79891 Long term (current) use of opiate analgesic: Secondary | ICD-10-CM | POA: Diagnosis not present

## 2016-08-26 DIAGNOSIS — M545 Low back pain: Secondary | ICD-10-CM | POA: Diagnosis not present

## 2016-08-26 DIAGNOSIS — G894 Chronic pain syndrome: Secondary | ICD-10-CM | POA: Diagnosis not present

## 2016-08-28 ENCOUNTER — Other Ambulatory Visit: Payer: Self-pay | Admitting: *Deleted

## 2016-08-28 MED ORDER — OLANZAPINE 5 MG PO TABS: ORAL_TABLET | ORAL | 0 refills | Status: DC

## 2016-08-29 ENCOUNTER — Other Ambulatory Visit: Payer: Self-pay | Admitting: *Deleted

## 2016-08-29 MED ORDER — OLANZAPINE 7.5 MG PO TABS: 7.5000 mg | ORAL_TABLET | Freq: Every day | ORAL | 1 refills | Status: DC

## 2016-09-27 DIAGNOSIS — M47817 Spondylosis without myelopathy or radiculopathy, lumbosacral region: Secondary | ICD-10-CM | POA: Diagnosis not present

## 2016-09-27 DIAGNOSIS — M961 Postlaminectomy syndrome, not elsewhere classified: Secondary | ICD-10-CM | POA: Diagnosis not present

## 2016-09-27 DIAGNOSIS — Z79891 Long term (current) use of opiate analgesic: Secondary | ICD-10-CM | POA: Diagnosis not present

## 2016-09-27 DIAGNOSIS — G894 Chronic pain syndrome: Secondary | ICD-10-CM | POA: Diagnosis not present

## 2016-09-27 DIAGNOSIS — M545 Low back pain: Secondary | ICD-10-CM | POA: Diagnosis not present

## 2016-09-27 DIAGNOSIS — Z79899 Other long term (current) drug therapy: Secondary | ICD-10-CM | POA: Diagnosis not present

## 2016-10-09 DIAGNOSIS — M47817 Spondylosis without myelopathy or radiculopathy, lumbosacral region: Secondary | ICD-10-CM | POA: Diagnosis not present

## 2016-10-23 DIAGNOSIS — M47817 Spondylosis without myelopathy or radiculopathy, lumbosacral region: Secondary | ICD-10-CM | POA: Diagnosis not present

## 2016-11-19 DIAGNOSIS — G894 Chronic pain syndrome: Secondary | ICD-10-CM | POA: Diagnosis not present

## 2016-11-19 DIAGNOSIS — M961 Postlaminectomy syndrome, not elsewhere classified: Secondary | ICD-10-CM | POA: Diagnosis not present

## 2016-11-19 DIAGNOSIS — M545 Low back pain: Secondary | ICD-10-CM | POA: Diagnosis not present

## 2016-11-19 DIAGNOSIS — M47817 Spondylosis without myelopathy or radiculopathy, lumbosacral region: Secondary | ICD-10-CM | POA: Diagnosis not present

## 2016-12-16 DIAGNOSIS — N183 Chronic kidney disease, stage 3 (moderate): Secondary | ICD-10-CM | POA: Diagnosis not present

## 2016-12-16 DIAGNOSIS — I2510 Atherosclerotic heart disease of native coronary artery without angina pectoris: Secondary | ICD-10-CM | POA: Diagnosis not present

## 2016-12-16 DIAGNOSIS — I482 Chronic atrial fibrillation: Secondary | ICD-10-CM | POA: Diagnosis not present

## 2016-12-16 DIAGNOSIS — I5032 Chronic diastolic (congestive) heart failure: Secondary | ICD-10-CM | POA: Diagnosis not present

## 2016-12-17 DIAGNOSIS — M545 Low back pain: Secondary | ICD-10-CM | POA: Diagnosis not present

## 2016-12-17 DIAGNOSIS — M961 Postlaminectomy syndrome, not elsewhere classified: Secondary | ICD-10-CM | POA: Diagnosis not present

## 2016-12-17 DIAGNOSIS — M47817 Spondylosis without myelopathy or radiculopathy, lumbosacral region: Secondary | ICD-10-CM | POA: Diagnosis not present

## 2016-12-17 DIAGNOSIS — G894 Chronic pain syndrome: Secondary | ICD-10-CM | POA: Diagnosis not present

## 2017-01-14 DIAGNOSIS — Z79891 Long term (current) use of opiate analgesic: Secondary | ICD-10-CM | POA: Diagnosis not present

## 2017-01-14 DIAGNOSIS — G894 Chronic pain syndrome: Secondary | ICD-10-CM | POA: Diagnosis not present

## 2017-01-14 DIAGNOSIS — M47817 Spondylosis without myelopathy or radiculopathy, lumbosacral region: Secondary | ICD-10-CM | POA: Diagnosis not present

## 2017-01-14 DIAGNOSIS — Z79899 Other long term (current) drug therapy: Secondary | ICD-10-CM | POA: Diagnosis not present

## 2017-01-14 DIAGNOSIS — M545 Low back pain: Secondary | ICD-10-CM | POA: Diagnosis not present

## 2017-01-14 DIAGNOSIS — M961 Postlaminectomy syndrome, not elsewhere classified: Secondary | ICD-10-CM | POA: Diagnosis not present

## 2017-03-01 ENCOUNTER — Other Ambulatory Visit: Payer: Self-pay | Admitting: Internal Medicine

## 2017-03-11 ENCOUNTER — Telehealth: Payer: Self-pay | Admitting: *Deleted

## 2017-03-11 NOTE — Telephone Encounter (Signed)
I CALLED TO SCHEDULE THE PT A FU APPOINTMENT & SHERRY DECLINED TO SCHEDULE SAID SHE HAS THE FLU & CANT GET HER DAD HERE. WILL CALL IN FEB 2 SCHEDULE

## 2017-03-15 ENCOUNTER — Other Ambulatory Visit: Payer: Self-pay | Admitting: Internal Medicine

## 2017-03-18 DIAGNOSIS — M545 Low back pain: Secondary | ICD-10-CM | POA: Diagnosis not present

## 2017-03-18 DIAGNOSIS — Z79891 Long term (current) use of opiate analgesic: Secondary | ICD-10-CM | POA: Diagnosis not present

## 2017-03-18 DIAGNOSIS — M961 Postlaminectomy syndrome, not elsewhere classified: Secondary | ICD-10-CM | POA: Diagnosis not present

## 2017-03-18 DIAGNOSIS — G894 Chronic pain syndrome: Secondary | ICD-10-CM | POA: Diagnosis not present

## 2017-03-18 DIAGNOSIS — M47817 Spondylosis without myelopathy or radiculopathy, lumbosacral region: Secondary | ICD-10-CM | POA: Diagnosis not present

## 2017-03-20 ENCOUNTER — Telehealth: Payer: Self-pay | Admitting: *Deleted

## 2017-03-20 NOTE — Telephone Encounter (Signed)
Judeen Hammans, the patient's daughter and POA, called regarding nursing home placement for the patient.  Per Dr Melford Aase, the family can call social services and request a social worker to help with placement. When they decide on a facility, Dr Melford Aase will fill out the [facility] form. The patient lives close to [facility] in [place] and they will ask to take a tour there.

## 2017-03-20 NOTE — Telephone Encounter (Signed)
Enter in error

## 2017-04-05 ENCOUNTER — Other Ambulatory Visit: Payer: Self-pay | Admitting: Internal Medicine

## 2017-04-22 ENCOUNTER — Encounter (HOSPITAL_COMMUNITY): Payer: Self-pay | Admitting: Internal Medicine

## 2017-04-22 ENCOUNTER — Inpatient Hospital Stay (HOSPITAL_COMMUNITY)
Admission: EM | Admit: 2017-04-22 | Discharge: 2017-04-26 | DRG: 481 | Disposition: A | Discharge status: Skilled Nursing Facility | Payer: Medicare Other | Attending: Internal Medicine | Admitting: Internal Medicine

## 2017-04-22 ENCOUNTER — Inpatient Hospital Stay (HOSPITAL_COMMUNITY): Payer: Medicare Other

## 2017-04-22 ENCOUNTER — Emergency Department (HOSPITAL_COMMUNITY): Payer: Medicare Other

## 2017-04-22 DIAGNOSIS — Y9389 Activity, other specified: Secondary | ICD-10-CM | POA: Diagnosis not present

## 2017-04-22 DIAGNOSIS — E785 Hyperlipidemia, unspecified: Secondary | ICD-10-CM | POA: Diagnosis present

## 2017-04-22 DIAGNOSIS — R2681 Unsteadiness on feet: Secondary | ICD-10-CM | POA: Diagnosis not present

## 2017-04-22 DIAGNOSIS — Z823 Family history of stroke: Secondary | ICD-10-CM | POA: Diagnosis not present

## 2017-04-22 DIAGNOSIS — Z01811 Encounter for preprocedural respiratory examination: Secondary | ICD-10-CM

## 2017-04-22 DIAGNOSIS — I2510 Atherosclerotic heart disease of native coronary artery without angina pectoris: Secondary | ICD-10-CM | POA: Diagnosis not present

## 2017-04-22 DIAGNOSIS — N179 Acute kidney failure, unspecified: Secondary | ICD-10-CM | POA: Diagnosis not present

## 2017-04-22 DIAGNOSIS — M25552 Pain in left hip: Secondary | ICD-10-CM | POA: Diagnosis not present

## 2017-04-22 DIAGNOSIS — T1490XA Injury, unspecified, initial encounter: Secondary | ICD-10-CM

## 2017-04-22 DIAGNOSIS — S72452D Displaced supracondylar fracture without intracondylar extension of lower end of left femur, subsequent encounter for closed fracture with routine healing: Secondary | ICD-10-CM | POA: Diagnosis not present

## 2017-04-22 DIAGNOSIS — S3992XA Unspecified injury of lower back, initial encounter: Secondary | ICD-10-CM | POA: Diagnosis not present

## 2017-04-22 DIAGNOSIS — I10 Essential (primary) hypertension: Secondary | ICD-10-CM | POA: Insufficient documentation

## 2017-04-22 DIAGNOSIS — Z7901 Long term (current) use of anticoagulants: Secondary | ICD-10-CM | POA: Diagnosis not present

## 2017-04-22 DIAGNOSIS — S79912A Unspecified injury of left hip, initial encounter: Secondary | ICD-10-CM | POA: Diagnosis not present

## 2017-04-22 DIAGNOSIS — I252 Old myocardial infarction: Secondary | ICD-10-CM

## 2017-04-22 DIAGNOSIS — K5900 Constipation, unspecified: Secondary | ICD-10-CM | POA: Diagnosis not present

## 2017-04-22 DIAGNOSIS — Z955 Presence of coronary angioplasty implant and graft: Secondary | ICD-10-CM

## 2017-04-22 DIAGNOSIS — Y92019 Unspecified place in single-family (private) house as the place of occurrence of the external cause: Secondary | ICD-10-CM

## 2017-04-22 DIAGNOSIS — E559 Vitamin D deficiency, unspecified: Secondary | ICD-10-CM | POA: Diagnosis present

## 2017-04-22 DIAGNOSIS — R278 Other lack of coordination: Secondary | ICD-10-CM | POA: Diagnosis not present

## 2017-04-22 DIAGNOSIS — Z4789 Encounter for other orthopedic aftercare: Secondary | ICD-10-CM | POA: Diagnosis not present

## 2017-04-22 DIAGNOSIS — M79605 Pain in left leg: Secondary | ICD-10-CM | POA: Diagnosis not present

## 2017-04-22 DIAGNOSIS — K219 Gastro-esophageal reflux disease without esophagitis: Secondary | ICD-10-CM | POA: Diagnosis present

## 2017-04-22 DIAGNOSIS — I129 Hypertensive chronic kidney disease with stage 1 through stage 4 chronic kidney disease, or unspecified chronic kidney disease: Secondary | ICD-10-CM | POA: Diagnosis present

## 2017-04-22 DIAGNOSIS — G8929 Other chronic pain: Secondary | ICD-10-CM | POA: Diagnosis not present

## 2017-04-22 DIAGNOSIS — G4733 Obstructive sleep apnea (adult) (pediatric): Secondary | ICD-10-CM | POA: Diagnosis present

## 2017-04-22 DIAGNOSIS — R296 Repeated falls: Secondary | ICD-10-CM | POA: Diagnosis not present

## 2017-04-22 DIAGNOSIS — W08XXXA Fall from other furniture, initial encounter: Secondary | ICD-10-CM | POA: Diagnosis present

## 2017-04-22 DIAGNOSIS — Z85820 Personal history of malignant melanoma of skin: Secondary | ICD-10-CM | POA: Diagnosis not present

## 2017-04-22 DIAGNOSIS — S72452A Displaced supracondylar fracture without intracondylar extension of lower end of left femur, initial encounter for closed fracture: Secondary | ICD-10-CM | POA: Diagnosis not present

## 2017-04-22 DIAGNOSIS — Z7982 Long term (current) use of aspirin: Secondary | ICD-10-CM | POA: Diagnosis not present

## 2017-04-22 DIAGNOSIS — I4891 Unspecified atrial fibrillation: Secondary | ICD-10-CM | POA: Diagnosis not present

## 2017-04-22 DIAGNOSIS — G8911 Acute pain due to trauma: Secondary | ICD-10-CM | POA: Diagnosis not present

## 2017-04-22 DIAGNOSIS — Z8249 Family history of ischemic heart disease and other diseases of the circulatory system: Secondary | ICD-10-CM | POA: Diagnosis not present

## 2017-04-22 DIAGNOSIS — R079 Chest pain, unspecified: Secondary | ICD-10-CM | POA: Diagnosis not present

## 2017-04-22 DIAGNOSIS — Z87891 Personal history of nicotine dependence: Secondary | ICD-10-CM

## 2017-04-22 DIAGNOSIS — S199XXA Unspecified injury of neck, initial encounter: Secondary | ICD-10-CM | POA: Diagnosis not present

## 2017-04-22 DIAGNOSIS — S0990XA Unspecified injury of head, initial encounter: Secondary | ICD-10-CM | POA: Diagnosis not present

## 2017-04-22 DIAGNOSIS — F0390 Unspecified dementia without behavioral disturbance: Secondary | ICD-10-CM | POA: Diagnosis present

## 2017-04-22 DIAGNOSIS — N183 Chronic kidney disease, stage 3 (moderate): Secondary | ICD-10-CM | POA: Diagnosis present

## 2017-04-22 DIAGNOSIS — M542 Cervicalgia: Secondary | ICD-10-CM | POA: Diagnosis not present

## 2017-04-22 DIAGNOSIS — I480 Paroxysmal atrial fibrillation: Secondary | ICD-10-CM | POA: Diagnosis not present

## 2017-04-22 DIAGNOSIS — G6181 Chronic inflammatory demyelinating polyneuritis: Secondary | ICD-10-CM | POA: Diagnosis present

## 2017-04-22 DIAGNOSIS — M9712XA Periprosthetic fracture around internal prosthetic left knee joint, initial encounter: Secondary | ICD-10-CM | POA: Diagnosis present

## 2017-04-22 DIAGNOSIS — S7292XA Unspecified fracture of left femur, initial encounter for closed fracture: Secondary | ICD-10-CM | POA: Diagnosis present

## 2017-04-22 DIAGNOSIS — S72352A Displaced comminuted fracture of shaft of left femur, initial encounter for closed fracture: Secondary | ICD-10-CM | POA: Diagnosis not present

## 2017-04-22 DIAGNOSIS — M25562 Pain in left knee: Secondary | ICD-10-CM | POA: Diagnosis not present

## 2017-04-22 DIAGNOSIS — S8992XA Unspecified injury of left lower leg, initial encounter: Secondary | ICD-10-CM | POA: Diagnosis not present

## 2017-04-22 DIAGNOSIS — D631 Anemia in chronic kidney disease: Secondary | ICD-10-CM | POA: Diagnosis present

## 2017-04-22 DIAGNOSIS — I482 Chronic atrial fibrillation: Secondary | ICD-10-CM | POA: Diagnosis not present

## 2017-04-22 DIAGNOSIS — M6281 Muscle weakness (generalized): Secondary | ICD-10-CM | POA: Diagnosis not present

## 2017-04-22 DIAGNOSIS — Z419 Encounter for procedure for purposes other than remedying health state, unspecified: Secondary | ICD-10-CM

## 2017-04-22 DIAGNOSIS — S72402A Unspecified fracture of lower end of left femur, initial encounter for closed fracture: Secondary | ICD-10-CM | POA: Diagnosis not present

## 2017-04-22 HISTORY — DX: Unspecified fracture of left femur, initial encounter for closed fracture: S72.92XA

## 2017-04-22 LAB — CBC WITH DIFFERENTIAL/PLATELET
BASOS PCT: 0 %
Basophils Absolute: 0 10*3/uL (ref 0.0–0.1)
EOS ABS: 0.1 10*3/uL (ref 0.0–0.7)
Eosinophils Relative: 1 %
HEMATOCRIT: 31.9 % — AB (ref 39.0–52.0)
HEMOGLOBIN: 10 g/dL — AB (ref 13.0–17.0)
Lymphocytes Relative: 15 %
Lymphs Abs: 1.9 10*3/uL (ref 0.7–4.0)
MCH: 26.8 pg (ref 26.0–34.0)
MCHC: 31.3 g/dL (ref 30.0–36.0)
MCV: 85.5 fL (ref 78.0–100.0)
Monocytes Absolute: 0.8 10*3/uL (ref 0.1–1.0)
Monocytes Relative: 7 %
NEUTROS ABS: 9.9 10*3/uL — AB (ref 1.7–7.7)
NEUTROS PCT: 77 %
Platelets: 220 10*3/uL (ref 150–400)
RBC: 3.73 MIL/uL — AB (ref 4.22–5.81)
RDW: 18.2 % — ABNORMAL HIGH (ref 11.5–15.5)
WBC: 12.7 10*3/uL — AB (ref 4.0–10.5)

## 2017-04-22 LAB — BASIC METABOLIC PANEL
ANION GAP: 11 (ref 5–15)
BUN: 26 mg/dL — ABNORMAL HIGH (ref 6–20)
CALCIUM: 9.2 mg/dL (ref 8.9–10.3)
CO2: 22 mmol/L (ref 22–32)
CREATININE: 1.7 mg/dL — AB (ref 0.61–1.24)
Chloride: 103 mmol/L (ref 101–111)
GFR, EST AFRICAN AMERICAN: 43 mL/min — AB (ref >60)
GFR, EST NON AFRICAN AMERICAN: 37 mL/min — AB (ref >60)
Glucose, Bld: 115 mg/dL — ABNORMAL HIGH (ref 65–99)
Potassium: 4.1 mmol/L (ref 3.5–5.1)
Sodium: 136 mmol/L (ref 135–145)

## 2017-04-22 LAB — VITAMIN B12: Vitamin B-12: 399 pg/mL (ref 180–914)

## 2017-04-22 LAB — PROTIME-INR
INR: 1.74
Prothrombin Time: 20.2 seconds — ABNORMAL HIGH (ref 11.4–15.2)

## 2017-04-22 MED ORDER — HYDROCODONE-ACETAMINOPHEN 5-325 MG PO TABS
1.0000 | ORAL_TABLET | ORAL | Status: DC | PRN
Start: 2017-04-22 — End: 2017-04-22

## 2017-04-22 MED ORDER — HYDROCODONE-ACETAMINOPHEN 7.5-325 MG PO TABS
1.0000 | ORAL_TABLET | Freq: Every day | ORAL | Status: DC
  Administered 2017-04-23 – 2017-04-26 (×13): 1 via ORAL
  Filled 2017-04-22 (×14): qty 1

## 2017-04-22 MED ORDER — METHOCARBAMOL 1000 MG/10ML IJ SOLN
500.0000 mg | Freq: Four times a day (QID) | INTRAVENOUS | Status: DC | PRN
  Filled 2017-04-22: qty 5

## 2017-04-22 MED ORDER — HYDROCODONE-ACETAMINOPHEN 5-325 MG PO TABS
1.0000 | ORAL_TABLET | Freq: Four times a day (QID) | ORAL | Status: DC | PRN
  Administered 2017-04-22 (×2): 1 via ORAL
  Filled 2017-04-22 (×2): qty 1

## 2017-04-22 MED ORDER — OLANZAPINE 7.5 MG PO TABS
7.5000 mg | ORAL_TABLET | Freq: Every day | ORAL | Status: DC
  Administered 2017-04-22 – 2017-04-26 (×5): 7.5 mg via ORAL
  Filled 2017-04-22 (×5): qty 1

## 2017-04-22 MED ORDER — SODIUM CHLORIDE 0.9 % IV BOLUS (SEPSIS)
500.0000 mL | Freq: Once | INTRAVENOUS | Status: AC
  Administered 2017-04-22: 500 mL via INTRAVENOUS

## 2017-04-22 MED ORDER — MUPIROCIN 2 % EX OINT
1.0000 "application " | TOPICAL_OINTMENT | Freq: Every day | CUTANEOUS | Status: DC
  Filled 2017-04-22: qty 22

## 2017-04-22 MED ORDER — ATORVASTATIN CALCIUM 10 MG PO TABS
10.0000 mg | ORAL_TABLET | Freq: Every day | ORAL | Status: DC
  Administered 2017-04-23 – 2017-04-26 (×4): 10 mg via ORAL
  Filled 2017-04-22 (×4): qty 1

## 2017-04-22 MED ORDER — ESCITALOPRAM OXALATE 20 MG PO TABS
20.0000 mg | ORAL_TABLET | Freq: Every day | ORAL | Status: DC
  Administered 2017-04-22 – 2017-04-26 (×5): 20 mg via ORAL
  Filled 2017-04-22 (×4): qty 1
  Filled 2017-04-22: qty 2

## 2017-04-22 MED ORDER — FENTANYL CITRATE (PF) 100 MCG/2ML IJ SOLN
100.0000 ug | Freq: Once | INTRAMUSCULAR | Status: AC
  Administered 2017-04-22: 100 ug via INTRAVENOUS
  Filled 2017-04-22: qty 2

## 2017-04-22 MED ORDER — CARVEDILOL 6.25 MG PO TABS
6.2500 mg | ORAL_TABLET | Freq: Two times a day (BID) | ORAL | Status: DC
  Administered 2017-04-22: 6.25 mg via ORAL
  Filled 2017-04-22: qty 1

## 2017-04-22 MED ORDER — METHOCARBAMOL 500 MG PO TABS
500.0000 mg | ORAL_TABLET | Freq: Four times a day (QID) | ORAL | Status: DC | PRN
  Administered 2017-04-22 – 2017-04-25 (×5): 500 mg via ORAL
  Filled 2017-04-22 (×5): qty 1

## 2017-04-22 MED ORDER — SENNOSIDES-DOCUSATE SODIUM 8.6-50 MG PO TABS: 1.0000 | ORAL_TABLET | Freq: Every evening | ORAL | Status: DC | PRN

## 2017-04-22 MED ORDER — GABAPENTIN 300 MG PO CAPS
300.0000 mg | ORAL_CAPSULE | Freq: Three times a day (TID) | ORAL | Status: DC
Start: 2017-04-22 — End: 2017-04-26
  Administered 2017-04-22 – 2017-04-26 (×11): 300 mg via ORAL
  Filled 2017-04-22 (×11): qty 1

## 2017-04-22 MED ORDER — MORPHINE SULFATE (PF) 4 MG/ML IV SOLN: 0.5000 mg | INTRAVENOUS | Status: DC | PRN

## 2017-04-22 MED ORDER — MORPHINE SULFATE (PF) 4 MG/ML IV SOLN
2.0000 mg | INTRAVENOUS | Status: DC | PRN
  Administered 2017-04-22 – 2017-04-26 (×8): 2 mg via INTRAVENOUS
  Filled 2017-04-22 (×8): qty 1

## 2017-04-22 MED ORDER — PANTOPRAZOLE SODIUM 40 MG PO TBEC
40.0000 mg | DELAYED_RELEASE_TABLET | Freq: Every day | ORAL | Status: DC
  Administered 2017-04-22 – 2017-04-26 (×5): 40 mg via ORAL
  Filled 2017-04-22 (×5): qty 1

## 2017-04-22 NOTE — ED Triage Notes (Signed)
Pt arrived via [facility] after falling last night and again this morning, per family. Pt has pain to right knee and states he is unable to bear weight to the leg. Pt struck head, bruising noted to right eye. Pt takes daily ASA and Eliquis. Pt is alerty and oriented x4. No pain medication given PTA.

## 2017-04-22 NOTE — H&P (View-Only) (Signed)
Reason for Consult:Femur fx Referring Physician: E Schlossman  [name] is an 77 y.o. male.  HPI: [name] fell coming out of the bathroom last night. He was brought to the ED where x-rays showed a left periprosthetic femur fx and orthopedic surgery was consulted. He has fairly advanced dementia and history provided by family at bedside.  Past Medical History:  Diagnosis Date  . Anemia   . Anxiety   . Arthritis    knees  . Benign prostatic hypertrophy with urinary obstruction   . Blood transfusion 1 yr. ago   jerking,fever-after blood transfusion  . Blood transfusion    given wrong blood  . Bradycardia    hx of bradycardia in past per ov note of 12/12  . CAS (cerebral atherosclerosis)   . Coronary artery disease 1998   cardiac stent/ Clearance Dr Arnoldo Morale and Einar Gip with note on chart  . Depression    takes Zoloft  . Dysrhythmia    hx of atrial fib per ov note of 12/12   . Erectile dysfunction   . Family history of early CAD    male 1st degree relative <50  . GERD (gastroesophageal reflux disease)   . HA (headache)    sinus headaches  . Hyperlipidemia   . Hypertension   . Low back pain   . Myocardial infarction ([place]) 1997   Hx [place] and 1998  . Neuropathy, autonomic, idiopathic peripheral, other   . Peptic ulcer disease   . Pneumonia 2005  . Preoperative cardiovascular examination   . Preventative health care   . Sleep apnea, obstructive    CPAP-does not use  . Spinal stenosis, lumbar   . Superficial spreading melanoma     Past Surgical History:  Procedure Laterality Date  . BACK SURGERY  2005-last   lumbar x2  . COLONOSCOPY  2004  . [place]   . CORONARY STENT PLACEMENT    . decompression of the median nerve     left wrist and hand  . ESOPHAGOGASTRODUODENOSCOPY  2004  . EXCISIONAL TOTAL KNEE ARTHROPLASTY  12/25/2010   Procedure: EXCISIONAL TOTAL KNEE ARTHROPLASTY;  Surgeon: Mauri Pole;  Location: [facility];  Service:  Orthopedics;  Laterality: Right;  repeat irrigation   and debridement, removal and reinsertation of spacer block  . I&D EXTREMITY  03/25/2011   Procedure: IRRIGATION AND DEBRIDEMENT EXTREMITY;  Surgeon: Mauri Pole, MD;  Location: [facility];  Service: Orthopedics;  Laterality: Right;  . JOINT REPLACEMENT  2011   left knee x 2 ; 08/2010-right knee-infected  . KNEE ARTHROSCOPY     left  . KNEE ARTHROSCOPY     right  . LUMBAR FUSION    . LUMBAR LAMINECTOMY    . revision of tkr  2011   on left  . SHOULDER ARTHROSCOPY     right  . TOTAL KNEE REVISION  05/13/2011   Procedure: TOTAL KNEE REVISION;  Surgeon: Mauri Pole, MD;  Location: [facility];  Service: Orthopedics;  Laterality: Right;  Reimplantation    Family History  Problem Relation Age of Onset  . Heart disease Father   . Stroke Mother   . Diabetes Sister   . Breast cancer Sister   . Heart disease Brother   . Breast cancer Sister     Social History:  reports that he quit smoking about 7 years ago. His smoking use included cigars. He has a 15.00 pack-year smoking history. he has never  used smokeless tobacco. He reports that he does not drink alcohol or use drugs.  Allergies:  Allergies  Allergen Reactions  . Acetaminophen Other (See Comments)    LIVER INFLAMMATION IN HIGH DOSES  . Lasix [Furosemide] Other (See Comments)    Could not function / could not get out of bed.    Medications: I have reviewed the patient's current medications.  Results for orders placed or performed during the hospital encounter of 04/22/17 (from the past 48 hour(s))  Basic metabolic panel     Status: Abnormal   Collection Time: 04/22/17 12:04 PM  Result Value Ref Range   Sodium 136 135 - 145 mmol/L   Potassium 4.1 3.5 - 5.1 mmol/L   Chloride 103 101 - 111 mmol/L   CO2 22 22 - 32 mmol/L   Glucose, Bld 115 (H) 65 - 99 mg/dL   BUN 26 (H) 6 - 20 mg/dL   Creatinine, Ser 1.70 (H) 0.61 - 1.24 mg/dL   Calcium 9.2 8.9 - 10.3 mg/dL   GFR calc non Af  Amer 37 (L) >60 mL/min   GFR calc Af Amer 43 (L) >60 mL/min    Comment: (NOTE) The eGFR has been calculated using the CKD EPI equation. This calculation has not been validated in all clinical situations. eGFR's persistently <60 mL/min signify possible Chronic Kidney Disease.    Anion gap 11 5 - 15    Comment: Performed at [facility] [address] [place], [place] [address]  CBC with Differential     Status: Abnormal   Collection Time: 04/22/17 12:04 PM  Result Value Ref Range   WBC 12.7 (H) 4.0 - 10.5 K/uL   RBC 3.73 (L) 4.22 - 5.81 MIL/uL   Hemoglobin 10.0 (L) 13.0 - 17.0 g/dL   HCT 31.9 (L) 39.0 - 52.0 %   MCV 85.5 78.0 - 100.0 fL   MCH 26.8 26.0 - 34.0 pg   MCHC 31.3 30.0 - 36.0 g/dL   RDW 18.2 (H) 11.5 - 15.5 %   Platelets 220 150 - 400 K/uL   Neutrophils Relative % 77 %   Neutro Abs 9.9 (H) 1.7 - 7.7 K/uL   Lymphocytes Relative 15 %   Lymphs Abs 1.9 0.7 - 4.0 K/uL   Monocytes Relative 7 %   Monocytes Absolute 0.8 0.1 - 1.0 K/uL   Eosinophils Relative 1 %   Eosinophils Absolute 0.1 0.0 - 0.7 K/uL   Basophils Relative 0 %   Basophils Absolute 0.0 0.0 - 0.1 K/uL    Comment: Performed at [facility] [address] [place], [place] [address]    Dg Lumbar Spine Complete  Result Date: 04/22/2017 CLINICAL DATA:  [place] last night, LEFT knee pain. History of lumbar spine surgery. EXAM: LUMBAR SPINE - COMPLETE 4+ VIEW COMPARISON:  Lumbar spine radiographs July 25, 2015 FINDINGS: Stable broad dextroscoliosis. No acute fracture deformity. No malalignment. Severe L1-2 disc height loss with endplate sclerosis in subsidence. Severe L2-3 and L3-4 similar disc height loss with vacuum disc, endplate sclerosis and marginal spurring. Severe lower lumbar facet arthropathy. No destructive bony lesions. Ankylosis of the sacroiliac joints. Prevertebral and paraspinal soft tissue planes are nonacute. 3.3 cm heavily calcified infrarenal aortic aneurysm. IMPRESSION: No acute fracture  deformity or malalignment. Stable marked endplate irregularity and subsidence L1-2 seen with old discitis osteomyelitis and/or severe degenerative disc. Stable overall degenerative change of the lumbar spine. 3.3 cm infrarenal aortic aneurysm for which follow-up abdominal ultrasound or CT angiogram  of the abdomen is recommended. Aortic Atherosclerosis (ICD10-I70.0). Electronically Signed   By: Elon Alas M.D.   On: 04/22/2017 13:38   Ct Head Wo Contrast  Result Date: 04/22/2017 CLINICAL DATA:  Patient with neck pain.  Patient status post fall. EXAM: CT HEAD WITHOUT CONTRAST CT CERVICAL SPINE WITHOUT CONTRAST TECHNIQUE: Multidetector CT imaging of the head and cervical spine was performed following the standard protocol without intravenous contrast. Multiplanar CT image reconstructions of the cervical spine were also generated. COMPARISON:  Brain CT 07/12/2013. FINDINGS: CT HEAD FINDINGS Brain: Ventricles and sulci are appropriate for patient's age. No evidence for acute cortically based infarct, intracranial hemorrhage, mass lesion or mass-effect. Vascular: Internal carotid arterial vascular calcifications. Skull: Intact. Sinuses/Orbits: Paranasal sinuses are well aerated. Mastoid air cells are unremarkable. Orbits are unremarkable. Other: None. CT CERVICAL SPINE FINDINGS Alignment: Grade 1 anterolisthesis of C3 on C4. Skull base and vertebrae: Intact. Soft tissues and spinal canal: No prevertebral fluid or swelling. No visible canal hematoma. Disc levels: Multilevel degenerative disc disease. Fusion at the C3-4 level. No acute fracture. Multilevel facet degenerative changes. Upper chest: Unremarkable. Other: None. IMPRESSION: No acute intracranial process. No acute cervical spine fracture. Multilevel degenerative disc disease. Osseous fusion C3-4. Electronically Signed   By: Lovey Newcomer M.D.   On: 04/22/2017 13:16   Ct Cervical Spine Wo Contrast  Result Date: 04/22/2017 CLINICAL DATA:  Patient with  neck pain.  Patient status post fall. EXAM: CT HEAD WITHOUT CONTRAST CT CERVICAL SPINE WITHOUT CONTRAST TECHNIQUE: Multidetector CT imaging of the head and cervical spine was performed following the standard protocol without intravenous contrast. Multiplanar CT image reconstructions of the cervical spine were also generated. COMPARISON:  Brain CT 07/12/2013. FINDINGS: CT HEAD FINDINGS Brain: Ventricles and sulci are appropriate for patient's age. No evidence for acute cortically based infarct, intracranial hemorrhage, mass lesion or mass-effect. Vascular: Internal carotid arterial vascular calcifications. Skull: Intact. Sinuses/Orbits: Paranasal sinuses are well aerated. Mastoid air cells are unremarkable. Orbits are unremarkable. Other: None. CT CERVICAL SPINE FINDINGS Alignment: Grade 1 anterolisthesis of C3 on C4. Skull base and vertebrae: Intact. Soft tissues and spinal canal: No prevertebral fluid or swelling. No visible canal hematoma. Disc levels: Multilevel degenerative disc disease. Fusion at the C3-4 level. No acute fracture. Multilevel facet degenerative changes. Upper chest: Unremarkable. Other: None. IMPRESSION: No acute intracranial process. No acute cervical spine fracture. Multilevel degenerative disc disease. Osseous fusion C3-4. Electronically Signed   By: Lovey Newcomer M.D.   On: 04/22/2017 13:16   Dg Knee Complete 4 Views Left  Result Date: 04/22/2017 CLINICAL DATA:  Recent fall with left knee pain, initial encounter EXAM: LEFT KNEE - COMPLETE 4+ VIEW COMPARISON:  None. FINDINGS: There is a comminuted fracture identified in the distal left femoral metaphysis with lateral displacement of the distal fracture fragments as well as the femoral portion of the knee prosthesis. Some impaction is also noted at the fracture site. The proximal tibia and fibula appear within normal limits with the tibial prosthesis is intact. IMPRESSION: Comminuted distal metaphyseal fracture of the left femur as described.  Electronically Signed   By: Inez Catalina M.D.   On: 04/22/2017 13:42   Dg Hip Unilat W Or Wo Pelvis 2-3 Views Left  Result Date: 04/22/2017 CLINICAL DATA:  Recent falls with left hip pain, initial encounter EXAM: DG HIP (WITH OR WITHOUT PELVIS) 2-3V LEFT COMPARISON:  06/26/2015 FINDINGS: Pelvic ring is well visualized and intact. No dislocation is seen. Degenerative changes of lumbar spine and hip  joints are noted bilaterally. There is a vertical lucency through the intratrochanteric region seen only on the oblique image. The possibility of an undisplaced fracture could not be totally excluded. If clinical suspicion is high a CT of the pelvis may be helpful for further evaluation. IMPRESSION: Degenerative changes of lumbar spine and hip joints. Vertical lucency through the proximal femur as described only on the oblique image. If clinically indicated CT of the pelvis may be helpful for further evaluation. Electronically Signed   By: Inez Catalina M.D.   On: 04/22/2017 13:41    Review of Systems  Unable to perform ROS: Dementia  Musculoskeletal: Positive for joint pain (Left knee).   Blood pressure (!) 106/53, pulse 86, temperature 98.5 F (36.9 C), temperature source Oral, resp. rate 18, SpO2 95 %. Physical Exam  Constitutional: He appears well-developed and well-nourished. No distress.  HENT:  Head: Normocephalic and atraumatic.  Eyes: Conjunctivae are normal. Right eye exhibits no discharge. Left eye exhibits no discharge. No scleral icterus.  Neck: Normal range of motion.  Cardiovascular: Normal rate and regular rhythm.  Respiratory: Effort normal. No respiratory distress.  Musculoskeletal:  RLE No traumatic wounds, ecchymosis, or rash  Nontender  No knee or ankle effusion  Knee stable to varus/ valgus and anterior/posterior stress  Sens DPN, SPN, TN paresthetic  Motor EHL, ext, flex, evers 5/5  DP 2+, PT 2+, No significant edema  LLE No traumatic wounds, ecchymosis, or rash  Mod TTP  knee  No ankle effusion  Sens DPN, TN paresthetic, SPN absent  Motor EHL 2/5, ext, flex, evers 5/5  DP 2+, PT 2+, No significant edema  Neurological: He is alert.  Skin: Skin is warm and dry. He is not diaphoretic.  Psychiatric: He has a normal mood and affect. His behavior is normal.    Assessment/Plan: Fall Left distal periprosthetic femur fx -- Will need ORIF, likely tomorrow with either Dr. Griffin Basil or Dr. Doreatha Martin. Please keep NPO after MN. Will place in [facility] traction overnight. Multiple medical problems -- per IM who will admit    Lisette Abu, PA-C Orthopedic Surgery [phone] 04/22/2017, 2:12 PM

## 2017-04-22 NOTE — ED Notes (Signed)
Patient transported to X-ray 

## 2017-04-22 NOTE — ED Notes (Signed)
Patient transported to x-ray. ?

## 2017-04-22 NOTE — ED Provider Notes (Signed)
[facility] EMERGENCY DEPARTMENT Provider Note   CSN: [id number] Arrival date & time: 04/22/17  1131     History   Chief Complaint Chief Complaint  Patient presents with  . Fall    HPI [name] is a 77 y.o. male past medical history of anemia, CAD, GERD, dysrhythmia who presents for evaluation of mechanical fall that occurred yesterday.  Patient reports that he was at home hand was hanging pictures on a wall.  He reports that he was on a small step stool when he got his feet tangled in the stool, causing him to fall. He denies any preceding chest pain, dizziness.  Reports that he Hit his head but did not lose any consciousness.  Patient reports that he also hit his left lower extremity.  He reports that he was able to get up and walk was able to get up and walk around.  Patient reports that today he had another minor fall.  Patient reports that since fall today, he has not been able to ambulate or bear weight on his left lower leg.  He reports pain to the left knee.  He reports difficulty moving left knee secondary to pain. He also reports some associated numbness to the medial aspect of the left lower leg. Patient is currently on blood thinners. Patient denies any vision changes, chest pain, difficulty breathing, abdominal pain, nausea/vomiting.  The history is provided by the patient.    Past Medical History:  Diagnosis Date  . Anemia   . Anxiety   . Arthritis    knees  . Benign prostatic hypertrophy with urinary obstruction   . Blood transfusion 1 yr. ago   jerking,fever-after blood transfusion  . Blood transfusion    given wrong blood  . Bradycardia    hx of bradycardia in past per ov note of 12/12  . CAS (cerebral atherosclerosis)   . Coronary artery disease 1998   cardiac stent/ Clearance Dr Arnoldo Morale and Einar Gip with note on chart  . Depression    takes Zoloft  . Dysrhythmia    hx of atrial fib per ov note of 12/12   . Erectile dysfunction   .  Family history of early CAD    male 1st degree relative <50  . Femur fracture, left ([place])   . GERD (gastroesophageal reflux disease)   . HA (headache)    sinus headaches  . Hyperlipidemia   . Hypertension   . Low back pain   . Myocardial infarction ([place]) 1997   Hx [place] and 1998  . Neuropathy, autonomic, idiopathic peripheral, other   . Peptic ulcer disease   . Pneumonia 2005  . Preoperative cardiovascular examination   . Preventative health care   . Sleep apnea, obstructive    CPAP-does not use  . Spinal stenosis, lumbar   . Superficial spreading melanoma     Patient Active Problem List   Diagnosis Date Noted  . Femur fracture, left ([place]) 04/22/2017  . Hypertension   . Hyperlipidemia 05/21/2015  . Prediabetes 05/21/2015  . Vitamin D deficiency 05/21/2015  . Medication management 05/21/2015  . GERD (gastroesophageal reflux disease) 05/21/2015  . ASHD (arteriosclerotic heart disease) 05/21/2015  . CIDP (chronic inflammatory demyelinating polyneuropathy) ([place]) 04/28/2014  . Lower extremity weakness 03/16/2014  . Hereditary and idiopathic peripheral neuropathy 03/16/2014  . Ataxia 03/16/2014  . Axonal sensorimotor neuropathy 03/16/2014  . Atrial fibrillation ([place]) 07/12/2013  . Foot drop, bilateral 09/08/2012  . Chronic low back pain 06/23/2012  .  S/P right TK revision 05/13/2011  . Obstructive sleep apnea 12/16/2006  . Essential hypertension 11/24/2006  . Coronary artery disease 02/19/1996    Past Surgical History:  Procedure Laterality Date  . BACK SURGERY  2005-last   lumbar x2  . COLONOSCOPY  2004  . [place]   . CORONARY STENT PLACEMENT    . decompression of the median nerve     left wrist and hand  . ESOPHAGOGASTRODUODENOSCOPY  2004  . EXCISIONAL TOTAL KNEE ARTHROPLASTY  12/25/2010   Procedure: EXCISIONAL TOTAL KNEE ARTHROPLASTY;  Surgeon: Mauri Pole;  Location: [facility];  Service: Orthopedics;  Laterality: Right;  repeat  irrigation   and debridement, removal and reinsertation of spacer block  . I&D EXTREMITY  03/25/2011   Procedure: IRRIGATION AND DEBRIDEMENT EXTREMITY;  Surgeon: Mauri Pole, MD;  Location: [facility];  Service: Orthopedics;  Laterality: Right;  . JOINT REPLACEMENT  2011   left knee x 2 ; 08/2010-right knee-infected  . KNEE ARTHROSCOPY     left  . KNEE ARTHROSCOPY     right  . LUMBAR FUSION    . LUMBAR LAMINECTOMY    . revision of tkr  2011   on left  . SHOULDER ARTHROSCOPY     right  . TOTAL KNEE REVISION  05/13/2011   Procedure: TOTAL KNEE REVISION;  Surgeon: Mauri Pole, MD;  Location: [facility];  Service: Orthopedics;  Laterality: Right;  Reimplantation       Home Medications    Prior to Admission medications   Medication Sig Start Date End Date Taking? Authorizing Provider  apixaban (ELIQUIS) 5 MG TABS tablet Take 5 mg by mouth 2 (two) times daily.   Yes [provider]  atorvastatin (LIPITOR) 10 MG tablet Take 10 mg by mouth daily.   Yes [provider]  carvedilol (COREG) 6.25 MG tablet Take 6.25 mg by mouth 2 (two) times daily with a meal. 12p and 9p   Yes [provider]  escitalopram (LEXAPRO) 20 MG tablet TAKE 1 TABLET BY MOUTH DAILY. Patient taking differently: TAKE 20mg  BY MOUTH DAILY. 03/15/17  Yes Unk Pinto, MD  gabapentin (NEURONTIN) 300 MG capsule Take 300 mg by mouth 3 (three) times daily.   Yes [provider]  HYDROcodone-acetaminophen (NORCO) 7.5-325 MG tablet Take 1 tablet by mouth 5 (five) times daily.  04/27/15  Yes [provider]  NITROSTAT 0.4 MG SL tablet Place 0.4 mg under the tongue every 5 (five) minutes as needed for chest pain.  06/01/14  Yes [provider]  OLANZapine (ZYPREXA) 7.5 MG tablet TAKE 1 TABLET BY MOUTH DAILY Patient taking differently: TAKE 7.5mg  BY MOUTH once DAILY 04/05/17  Yes Unk Pinto, MD  omeprazole-sodium bicarbonate (ZEGERID) 40-1100 MG per capsule Take 1 capsule by  mouth daily before breakfast. 04/19/13  Yes Pyrtle, Lajuan Lines, MD  torsemide (DEMADEX) 20 MG tablet Take 20 mg by mouth every other day.  05/11/15  Yes [provider]  zolpidem (AMBIEN) 5 MG tablet Take 1 tablet (5 mg total) by mouth at bedtime as needed for sleep. 03/27/11 04/30/11  Danae Orleans, PA-C    Family History Family History  Problem Relation Age of Onset  . Heart disease Father   . Stroke Mother   . Diabetes Sister   . Breast cancer Sister   . Heart disease Brother   . Breast cancer Sister     Social History Social History   Tobacco Use  .  Smoking status: Former Smoker    Packs/day: 1.00    Years: 15.00    Pack years: 15.00    Types: Cigars    Last attempt to quit: 12/23/2009    Years since quitting: 7.3  . Smokeless tobacco: Never Used  . Tobacco comment: smoked a pipe for 6-7 y ears  Substance Use Topics  . Alcohol use: No  . Drug use: No     Allergies   Acetaminophen and Lasix [furosemide]   Review of Systems Review of Systems  Constitutional: Negative for chills and fever.  HENT: Negative for congestion.   Eyes: Negative for visual disturbance.  Respiratory: Negative for cough and shortness of breath.   Cardiovascular: Negative for chest pain.  Gastrointestinal: Negative for abdominal pain, diarrhea, nausea and vomiting.  Genitourinary: Negative for dysuria and hematuria.  Musculoskeletal: Positive for back pain. Negative for neck pain.       Left knee pain  Skin: Negative for rash.  Neurological: Positive for headaches. Negative for dizziness, weakness and numbness.  Psychiatric/Behavioral: Negative for confusion.     Physical Exam Updated Vital Signs BP (!) 106/53 (BP Location: Left Arm)   Pulse 86   Temp 98.5 F (36.9 C) (Oral)   Resp 18   SpO2 95%   Physical Exam  Constitutional: He is oriented to person, place, and time. He appears well-developed and well-nourished.  HENT:  Head: Normocephalic and atraumatic.     Mouth/Throat: Oropharynx is clear and moist and mucous membranes are normal.  Eyes: Conjunctivae, EOM and lids are normal. Pupils are equal, round, and reactive to light.  EOMs intact without difficulty.  Neck: Full passive range of motion without pain.  Full flexion/extension and lateral movement of neck fully intact. No bony midline tenderness. No deformities or crepitus.   Cardiovascular: Normal rate, regular rhythm, normal heart sounds and normal pulses. Exam reveals no gallop and no friction rub.  No murmur heard. Pulses:      Dorsalis pedis pulses are 2+ on the right side, and 2+ on the left side.  Pulmonary/Chest: Effort normal and breath sounds normal.  Abdominal: Soft. Normal appearance. There is no tenderness. There is no rigidity and no guarding.  Musculoskeletal: Normal range of motion.  Diffuse tenderness palpation overlying the lumbar region.  No deformity or crepitus noted.  Tenderness palpation overlying the anterior aspect of left knee.  Unable to flex or extend knee secondary to pain. Flexion/extension of right lower extremity intact without any difficulty.   Neurological: He is alert and oriented to person, place, and time.  Some decreased sensation noted to the medial aspect of the distal left leg.  Normal sensation noted to bilateral feet.  Skin: Skin is warm and dry. Capillary refill takes less than 2 seconds.  Good distal cap refill.  LLE is not dusky in appearance or cool to touch.  Psychiatric: He has a normal mood and affect. His speech is normal.  Nursing note and vitals reviewed.    ED Treatments / Results  Labs (all labs ordered are listed, but only abnormal results are displayed) Labs Reviewed  BASIC METABOLIC PANEL - Abnormal; Notable for the following components:      Result Value   Glucose, Bld 115 (*)    BUN 26 (*)    Creatinine, Ser 1.70 (*)    GFR calc non Af Amer 37 (*)    GFR calc Af Amer 43 (*)    All other components within normal limits   CBC WITH  DIFFERENTIAL/PLATELET - Abnormal; Notable for the following components:   WBC 12.7 (*)    RBC 3.73 (*)    Hemoglobin 10.0 (*)    HCT 31.9 (*)    RDW 18.2 (*)    Neutro Abs 9.9 (*)    All other components within normal limits  PROTIME-INR - Abnormal; Notable for the following components:   Prothrombin Time 20.2 (*)    All other components within normal limits  VITAMIN B12  PROTIME-INR    EKG  EKG Interpretation None       Radiology Dg Chest 1 View  Result Date: 04/22/2017 CLINICAL DATA:  Preop.  Congestion for 1 day.  Former smoker. EXAM: CHEST 1 VIEW COMPARISON:  01/18/2014 FINDINGS: The cardiac silhouette remains enlarged. Aortic atherosclerosis is noted. There are chronic scratched of there is chronic peribronchial thickening. No confluent airspace opacity, overt pulmonary edema, sizable pleural effusion, or pneumothorax is identified. There are chronic degenerative changes about both shoulders with postoperative changes to the distal right clavicle. IMPRESSION: Chronic bronchitic changes. Electronically Signed   By: Logan Bores M.D.   On: 04/22/2017 16:00   Dg Lumbar Spine Complete  Result Date: 04/22/2017 CLINICAL DATA:  [place] last night, LEFT knee pain. History of lumbar spine surgery. EXAM: LUMBAR SPINE - COMPLETE 4+ VIEW COMPARISON:  Lumbar spine radiographs July 25, 2015 FINDINGS: Stable broad dextroscoliosis. No acute fracture deformity. No malalignment. Severe L1-2 disc height loss with endplate sclerosis in subsidence. Severe L2-3 and L3-4 similar disc height loss with vacuum disc, endplate sclerosis and marginal spurring. Severe lower lumbar facet arthropathy. No destructive bony lesions. Ankylosis of the sacroiliac joints. Prevertebral and paraspinal soft tissue planes are nonacute. 3.3 cm heavily calcified infrarenal aortic aneurysm. IMPRESSION: No acute fracture deformity or malalignment. Stable marked endplate irregularity and subsidence L1-2 seen with old  discitis osteomyelitis and/or severe degenerative disc. Stable overall degenerative change of the lumbar spine. 3.3 cm infrarenal aortic aneurysm for which follow-up abdominal ultrasound or CT angiogram of the abdomen is recommended. Aortic Atherosclerosis (ICD10-I70.0). Electronically Signed   By: Elon Alas M.D.   On: 04/22/2017 13:38   Ct Head Wo Contrast  Result Date: 04/22/2017 CLINICAL DATA:  Patient with neck pain.  Patient status post fall. EXAM: CT HEAD WITHOUT CONTRAST CT CERVICAL SPINE WITHOUT CONTRAST TECHNIQUE: Multidetector CT imaging of the head and cervical spine was performed following the standard protocol without intravenous contrast. Multiplanar CT image reconstructions of the cervical spine were also generated. COMPARISON:  Brain CT 07/12/2013. FINDINGS: CT HEAD FINDINGS Brain: Ventricles and sulci are appropriate for patient's age. No evidence for acute cortically based infarct, intracranial hemorrhage, mass lesion or mass-effect. Vascular: Internal carotid arterial vascular calcifications. Skull: Intact. Sinuses/Orbits: Paranasal sinuses are well aerated. Mastoid air cells are unremarkable. Orbits are unremarkable. Other: None. CT CERVICAL SPINE FINDINGS Alignment: Grade 1 anterolisthesis of C3 on C4. Skull base and vertebrae: Intact. Soft tissues and spinal canal: No prevertebral fluid or swelling. No visible canal hematoma. Disc levels: Multilevel degenerative disc disease. Fusion at the C3-4 level. No acute fracture. Multilevel facet degenerative changes. Upper chest: Unremarkable. Other: None. IMPRESSION: No acute intracranial process. No acute cervical spine fracture. Multilevel degenerative disc disease. Osseous fusion C3-4. Electronically Signed   By: Lovey Newcomer M.D.   On: 04/22/2017 13:16   Ct Cervical Spine Wo Contrast  Result Date: 04/22/2017 CLINICAL DATA:  Patient with neck pain.  Patient status post fall. EXAM: CT HEAD WITHOUT CONTRAST CT CERVICAL SPINE WITHOUT  CONTRAST TECHNIQUE:  Multidetector CT imaging of the head and cervical spine was performed following the standard protocol without intravenous contrast. Multiplanar CT image reconstructions of the cervical spine were also generated. COMPARISON:  Brain CT 07/12/2013. FINDINGS: CT HEAD FINDINGS Brain: Ventricles and sulci are appropriate for patient's age. No evidence for acute cortically based infarct, intracranial hemorrhage, mass lesion or mass-effect. Vascular: Internal carotid arterial vascular calcifications. Skull: Intact. Sinuses/Orbits: Paranasal sinuses are well aerated. Mastoid air cells are unremarkable. Orbits are unremarkable. Other: None. CT CERVICAL SPINE FINDINGS Alignment: Grade 1 anterolisthesis of C3 on C4. Skull base and vertebrae: Intact. Soft tissues and spinal canal: No prevertebral fluid or swelling. No visible canal hematoma. Disc levels: Multilevel degenerative disc disease. Fusion at the C3-4 level. No acute fracture. Multilevel facet degenerative changes. Upper chest: Unremarkable. Other: None. IMPRESSION: No acute intracranial process. No acute cervical spine fracture. Multilevel degenerative disc disease. Osseous fusion C3-4. Electronically Signed   By: Lovey Newcomer M.D.   On: 04/22/2017 13:16   Ct Hip Left Wo Contrast  Result Date: 04/22/2017 CLINICAL DATA:  Recent fall with left hip pain. Questionable left femur fracture radiographs. EXAM: CT OF THE LEFT HIP WITHOUT CONTRAST TECHNIQUE: Multidetector CT imaging of the left hip was performed according to the standard protocol. Multiplanar CT image reconstructions were also generated. COMPARISON:  Left hip radiographs 04/22/2017 and 06/26/2015. FINDINGS: Bones/Joint/Cartilage The mineralization is adequate. There is no evidence of acute fracture, dislocation or left femoral head avascular necrosis. Mild left hip degenerative changes are present without significant joint effusion. Mild degenerative changes are also present at the left  sacroiliac joint and L5-S1 disc space. Ligaments Suboptimally assessed by CT. Muscles and Tendons There is a least partial tearing of the left common hamstring tendon with partial tendon retraction into the posterior thigh. There is associated proximal left hamstring muscular fatty atrophy. There is some fatty atrophy within the left rectus femoris muscle as well. There is ossification along the insertion of the iliopsoas tendon. Soft tissues Iliac and femoral atherosclerosis. No evidence of periarticular hematoma. IMPRESSION: 1. No evidence of acute left hip fracture or dislocation. 2. No definite acute posttraumatic findings. 3. Partial tearing of the left common hamstring tendon with associated muscular atrophy. Electronically Signed   By: Richardean Sale M.D.   On: 04/22/2017 15:27   Dg Knee Complete 4 Views Left  Result Date: 04/22/2017 CLINICAL DATA:  Recent fall with left knee pain, initial encounter EXAM: LEFT KNEE - COMPLETE 4+ VIEW COMPARISON:  None. FINDINGS: There is a comminuted fracture identified in the distal left femoral metaphysis with lateral displacement of the distal fracture fragments as well as the femoral portion of the knee prosthesis. Some impaction is also noted at the fracture site. The proximal tibia and fibula appear within normal limits with the tibial prosthesis is intact. IMPRESSION: Comminuted distal metaphyseal fracture of the left femur as described. Electronically Signed   By: Inez Catalina M.D.   On: 04/22/2017 13:42   Dg Hip Unilat W Or Wo Pelvis 2-3 Views Left  Result Date: 04/22/2017 CLINICAL DATA:  Recent falls with left hip pain, initial encounter EXAM: DG HIP (WITH OR WITHOUT PELVIS) 2-3V LEFT COMPARISON:  06/26/2015 FINDINGS: Pelvic ring is well visualized and intact. No dislocation is seen. Degenerative changes of lumbar spine and hip joints are noted bilaterally. There is a vertical lucency through the intratrochanteric region seen only on the oblique image. The  possibility of an undisplaced fracture could not be totally excluded. If clinical suspicion is  high a CT of the pelvis may be helpful for further evaluation. IMPRESSION: Degenerative changes of lumbar spine and hip joints. Vertical lucency through the proximal femur as described only on the oblique image. If clinically indicated CT of the pelvis may be helpful for further evaluation. Electronically Signed   By: Inez Catalina M.D.   On: 04/22/2017 13:41    Procedures Procedures (including critical care time)  Medications Ordered in ED Medications  atorvastatin (LIPITOR) tablet 10 mg (not administered)  carvedilol (COREG) tablet 6.25 mg (not administered)  escitalopram (LEXAPRO) tablet 20 mg (not administered)  gabapentin (NEURONTIN) capsule 300 mg (not administered)  mupirocin ointment (BACTROBAN) 2 % 1 application (not administered)  OLANZapine (ZYPREXA) tablet 7.5 mg (not administered)  pantoprazole (PROTONIX) EC tablet 40 mg (not administered)  HYDROcodone-acetaminophen (NORCO/VICODIN) 5-325 MG per tablet 1-2 tablet (not administered)  morphine 4 MG/ML injection 0.52 mg (not administered)  methocarbamol (ROBAXIN) tablet 500 mg (not administered)    Or  methocarbamol (ROBAXIN) 500 mg in dextrose 5 % 50 mL IVPB (not administered)  senna-docusate (Senokot-S) tablet 1 tablet (not administered)  fentaNYL (SUBLIMAZE) injection 100 mcg (100 mcg Intravenous Given 04/22/17 1407)  sodium chloride 0.9 % bolus 500 mL (0 mLs Intravenous Stopped 04/22/17 1629)     Initial Impression / Assessment and Plan / ED Course  I have reviewed the triage vital signs and the nursing notes.  Pertinent labs & imaging results that were available during my care of the patient were reviewed by me and considered in my medical decision making (see chart for details).     77 y.o. male who presents for evaluation of mechanical falls.  Patient reports that he was hanging a picture yesterday and fell from a step stool.   Did hit his head but denies any LOC.  Patient reports that again he was hanging some pictures this morning and reports that he tripped, causing him to fall and land on his knee.  He reports that since the fall this morning, he has not been able to ambulate or move his left lower extremity.  He does report that he has a history of a prosthetic knee to the left knee.  Patient reports that he is currently on blood thinners.  He denies any vision changes, abdominal pain, nausea/vomiting.  Patient states that he did not have any preceding chest pain, dizziness prior to onset of fall. Patient is afebrile, non-toxic appearing, sitting comfortably on examination table. Vital signs reviewed and stable. Patient is neurovascularly intact. On exam, patient has diffuse tenderness palpation noted to the left knee.  He has limited range of motion secondary to pain.  No abnormalities of the right lower extremity.  Given that patient is on blood thinners, will plan for CT head, C spine. Will also obtain XR imaging for LLE. Analgesics provided in the department.  CT head is negative for any acute abnormality. No evidence of bony fracture. CT C-spine shows degenerative disc disease but otherwise no acute fracture.  X-ray shows a comminuted distal metaphyseal fracture of the left femur with lateral displacement of the distal fragment.  The fragments also incorporate part of the knee prosthesis.  We will plan to consult orthopedics.  Discussed patient with Hilbert Odor (Ortho PA). He will see patient in the ED.   Discussed with ortho PA. Recommends medical admission with plans for surgical intervention tomorrow.   CBC shows leukocytosis. Also with anemia but appears to be consistent with patient's baseline. BMP shows slight bump in  BUN/Cr.   Discussed with hospitalist. Will admit.    Final Clinical Impressions(s) / ED Diagnoses   Final diagnoses:  Closed displaced comminuted fracture of shaft of left femur, initial  encounter [facility])    ED Discharge Orders    None       Volanda Napoleon, PA-C 04/22/17 1636    Gareth Morgan, MD 04/23/17 1511

## 2017-04-22 NOTE — H&P (Signed)
History and Physical    [name] ZDG:[id number] DOB: 1941/02/16 DOA: 04/22/2017  PCP: Unk Pinto, MD Patient coming from: home  Chief Complaint: fall  HPI: [name] is a 77 y.o. male with medical history significant for Atrial fibrillation, chronic inflammatory demyelinating cidp, hypertension, dementia obstructive sleep apnea presents emergency department chief complaint left leg pain after a mechanical fall. Initial evaluation reveals left distal femur fracture. [facility] are asked to admit  Information is obtained from the daughter who is at the bedside and the patient noting that information from patient may be unreliable due to dementia.. She reports patient has a history pain to his right knee over the last several weeks and has been unable to bear weight. He fell last night and again this morning striking his head. Patient is on daily aspirin Eliquis. No reports of any headache dizziness syncope near syncope. He reports chest pain palpitation shortness of breath. No recent nausea vomiting diarrhea or decreased oral intake. He states he was on a small step stool seen got tangled causing him to fall. Daughters indicate that he actually fell on his way to the bathroom. He has had an unsteady gait for "quite a while". Daughters report he has pain numbness tingling decreased sensation bilateral lower extremities from the knee to the feet. They report frequent falls but this is the first time he's had injury   ED Course: In the emergency department he's afebrile hemodynamically stable and not hypoxic  Review of Systems: As per HPI otherwise all other systems reviewed and are negative.   Ambulatory Status:  with a walker occasionally unsteady gait due to chronic pain in his right knee independent with ADLs  Past Medical History:  Diagnosis Date  . Anemia   . Anxiety   . Arthritis    knees  . Benign prostatic hypertrophy with urinary obstruction   . Blood  transfusion 1 yr. ago   jerking,fever-after blood transfusion  . Blood transfusion    given wrong blood  . Bradycardia    hx of bradycardia in past per ov note of 12/12  . CAS (cerebral atherosclerosis)   . Coronary artery disease 1998   cardiac stent/ Clearance Dr Arnoldo Morale and Einar Gip with note on chart  . Depression    takes Zoloft  . Dysrhythmia    hx of atrial fib per ov note of 12/12   . Erectile dysfunction   . Family history of early CAD    male 1st degree relative <50  . Femur fracture, left ([place])   . GERD (gastroesophageal reflux disease)   . HA (headache)    sinus headaches  . Hyperlipidemia   . Hypertension   . Low back pain   . Myocardial infarction ([place]) 1997   Hx [place] and 1998  . Neuropathy, autonomic, idiopathic peripheral, other   . Peptic ulcer disease   . Pneumonia 2005  . Preoperative cardiovascular examination   . Preventative health care   . Sleep apnea, obstructive    CPAP-does not use  . Spinal stenosis, lumbar   . Superficial spreading melanoma     Past Surgical History:  Procedure Laterality Date  . BACK SURGERY  2005-last   lumbar x2  . COLONOSCOPY  2004  . [place]   . CORONARY STENT PLACEMENT    . decompression of the median nerve     left wrist and hand  . ESOPHAGOGASTRODUODENOSCOPY  2004  . EXCISIONAL TOTAL  KNEE ARTHROPLASTY  12/25/2010   Procedure: EXCISIONAL TOTAL KNEE ARTHROPLASTY;  Surgeon: Mauri Pole;  Location: [facility];  Service: Orthopedics;  Laterality: Right;  repeat irrigation   and debridement, removal and reinsertation of spacer block  . I&D EXTREMITY  03/25/2011   Procedure: IRRIGATION AND DEBRIDEMENT EXTREMITY;  Surgeon: Mauri Pole, MD;  Location: [facility];  Service: Orthopedics;  Laterality: Right;  . JOINT REPLACEMENT  2011   left knee x 2 ; 08/2010-right knee-infected  . KNEE ARTHROSCOPY     left  . KNEE ARTHROSCOPY     right  . LUMBAR FUSION    . LUMBAR LAMINECTOMY    .  revision of tkr  2011   on left  . SHOULDER ARTHROSCOPY     right  . TOTAL KNEE REVISION  05/13/2011   Procedure: TOTAL KNEE REVISION;  Surgeon: Mauri Pole, MD;  Location: [facility];  Service: Orthopedics;  Laterality: Right;  Reimplantation    Social History   Socioeconomic History  . Marital status: Married    Spouse name: Vaughan Basta  . Number of children: 4  . Years of education: HS  . Highest education level: Not on file  Social Needs  . Financial resource strain: Not on file  . Food insecurity - worry: Not on file  . Food insecurity - inability: Not on file  . Transportation needs - medical: Not on file  . Transportation needs - non-medical: Not on file  Occupational History  . Occupation: Engineer, technical sales: RETIRED    Comment: part time  . Occupation: worked at [employer]  . Occupation: retired 03-16-14  Tobacco Use  . Smoking status: Former Smoker    Packs/day: 1.00    Years: 15.00    Pack years: 15.00    Types: Cigars    Last attempt to quit: 12/23/2009    Years since quitting: 7.3  . Smokeless tobacco: Never Used  . Tobacco comment: smoked a pipe for 6-7 y ears  Substance and Sexual Activity  . Alcohol use: No  . Drug use: No  . Sexual activity: Not on file  Other Topics Concern  . Not on file  Social History Narrative   Caffeine 2 cups daily avg.     Allergies  Allergen Reactions  . Acetaminophen Other (See Comments)    LIVER INFLAMMATION IN HIGH DOSES  . Lasix [Furosemide] Other (See Comments)    Could not function / could not get out of bed.    Family History  Problem Relation Age of Onset  . Heart disease Father   . Stroke Mother   . Diabetes Sister   . Breast cancer Sister   . Heart disease Brother   . Breast cancer Sister     Prior to Admission medications   Medication Sig Start Date End Date Taking? Authorizing Provider  apixaban (ELIQUIS) 5 MG TABS tablet Take 5 mg by mouth 2 (two) times daily.   Yes [provider]    atorvastatin (LIPITOR) 10 MG tablet Take 10 mg by mouth daily.   Yes [provider]  carvedilol (COREG) 6.25 MG tablet Take 6.25 mg by mouth 2 (two) times daily with a meal. 12p and 9p   Yes [provider]  escitalopram (LEXAPRO) 20 MG tablet TAKE 1 TABLET BY MOUTH DAILY. Patient taking differently: TAKE 20mg  BY MOUTH DAILY. 03/15/17  Yes Unk Pinto, MD  gabapentin (NEURONTIN) 300 MG capsule Take 300 mg by mouth 3 (three) times  daily.   Yes [provider]  HYDROcodone-acetaminophen (NORCO) 7.5-325 MG tablet Take 1 tablet by mouth 5 (five) times daily.  04/27/15  Yes [provider]  NITROSTAT 0.4 MG SL tablet Place 0.4 mg under the tongue every 5 (five) minutes as needed for chest pain.  06/01/14  Yes [provider]  OLANZapine (ZYPREXA) 7.5 MG tablet TAKE 1 TABLET BY MOUTH DAILY Patient taking differently: TAKE 7.5mg  BY MOUTH once DAILY 04/05/17  Yes Unk Pinto, MD  omeprazole-sodium bicarbonate (ZEGERID) 40-1100 MG per capsule Take 1 capsule by mouth daily before breakfast. 04/19/13  Yes Pyrtle, Lajuan Lines, MD  torsemide (DEMADEX) 20 MG tablet Take 20 mg by mouth every other day.  05/11/15  Yes [provider]  zolpidem (AMBIEN) 5 MG tablet Take 1 tablet (5 mg total) by mouth at bedtime as needed for sleep. 03/27/11 04/30/11  Danae Orleans, PA-C    Physical Exam: Vitals:   04/22/17 1131  BP: (!) 106/53  Pulse: 86  Resp: 18  Temp: 98.5 F (36.9 C)  TempSrc: Oral  SpO2: 95%     General:  Appears calm and comfortable in no acute distress Eyes:  PERRL, EOMI, normal lids, i ecchymosis right. Orbital area ENT:  grossly normal hearing, lips & tongue, mucous membranes of his mouth are pink but dry Neck:  no LAD, masses or thyromegaly Cardiovascular:  RRR, no m/r/g. No LE edema.  Respiratory:  CTA bilaterally, no w/r/r. Normal respiratory effort. Abdomen:  soft, ntnd, positive bowel sounds no guarding or rebounding Skin:  no rash or  induration seen on limited exam Musculoskeletal:  Joints without swelling/erythema left lower extremity no swelling some tenderness to the left knee Simona Huh to that left upper leg. Psychiatric:  grossly normal mood and affect, speech fluent and appropriate, AOx3 Neurologic:  CN 2-12 grossly intact, moves all extremities in coordinated fashion, sensation intact  Labs on Admission: I have personally reviewed following labs and imaging studies  CBC: Recent Labs  Lab 04/22/17 1204  WBC 12.7*  NEUTROABS 9.9*  HGB 10.0*  HCT 31.9*  MCV 85.5  PLT 413   Basic Metabolic Panel: Recent Labs  Lab 04/22/17 1204  NA 136  K 4.1  CL 103  CO2 22  GLUCOSE 115*  BUN 26*  CREATININE 1.70*  CALCIUM 9.2   GFR: CrCl cannot be calculated (Unknown ideal weight.). Liver Function Tests: No results for input(s): AST, ALT, ALKPHOS, BILITOT, PROT, ALBUMIN in the last 168 hours. No results for input(s): LIPASE, AMYLASE in the last 168 hours. No results for input(s): AMMONIA in the last 168 hours. Coagulation Profile: Recent Labs  Lab 04/22/17 1517  INR 1.74   Cardiac Enzymes: No results for input(s): CKTOTAL, CKMB, CKMBINDEX, TROPONINI in the last 168 hours. BNP (last 3 results) No results for input(s): PROBNP in the last 8760 hours. HbA1C: No results for input(s): HGBA1C in the last 72 hours. CBG: No results for input(s): GLUCAP in the last 168 hours. Lipid Profile: No results for input(s): CHOL, HDL, LDLCALC, TRIG, CHOLHDL, LDLDIRECT in the last 72 hours. Thyroid Function Tests: No results for input(s): TSH, T4TOTAL, FREET4, T3FREE, THYROIDAB in the last 72 hours. Anemia Panel: No results for input(s): VITAMINB12, FOLATE, FERRITIN, TIBC, IRON, RETICCTPCT in the last 72 hours. Urine analysis:    Component Value Date/Time   COLORURINE YELLOW 06/28/2016 1011   APPEARANCEUR CLEAR 06/28/2016 1011   LABSPEC 1.007 06/28/2016 1011   PHURINE 6.5 06/28/2016 1011   GLUCOSEU NEGATIVE 06/28/2016  1011  HGBUR NEGATIVE 06/28/2016 1011   HGBUR negative 09/18/2007 0834   BILIRUBINUR NEGATIVE 06/28/2016 1011   BILIRUBINUR neg 04/11/2013 1836   KETONESUR NEGATIVE 06/28/2016 1011   PROTEINUR NEGATIVE 06/28/2016 1011   UROBILINOGEN 0.2 07/12/2013 0802   NITRITE NEGATIVE 06/28/2016 1011   LEUKOCYTESUR NEGATIVE 06/28/2016 1011    Creatinine Clearance: CrCl cannot be calculated (Unknown ideal weight.).  Sepsis Labs: @LABRCNTIP (procalcitonin:4,lacticidven:4) )No results found for this or any previous visit (from the past 240 hour(s)).   Radiological Exams on Admission: Dg Lumbar Spine Complete  Result Date: 04/22/2017 CLINICAL DATA:  [place] last night, LEFT knee pain. History of lumbar spine surgery. EXAM: LUMBAR SPINE - COMPLETE 4+ VIEW COMPARISON:  Lumbar spine radiographs July 25, 2015 FINDINGS: Stable broad dextroscoliosis. No acute fracture deformity. No malalignment. Severe L1-2 disc height loss with endplate sclerosis in subsidence. Severe L2-3 and L3-4 similar disc height loss with vacuum disc, endplate sclerosis and marginal spurring. Severe lower lumbar facet arthropathy. No destructive bony lesions. Ankylosis of the sacroiliac joints. Prevertebral and paraspinal soft tissue planes are nonacute. 3.3 cm heavily calcified infrarenal aortic aneurysm. IMPRESSION: No acute fracture deformity or malalignment. Stable marked endplate irregularity and subsidence L1-2 seen with old discitis osteomyelitis and/or severe degenerative disc. Stable overall degenerative change of the lumbar spine. 3.3 cm infrarenal aortic aneurysm for which follow-up abdominal ultrasound or CT angiogram of the abdomen is recommended. Aortic Atherosclerosis (ICD10-I70.0). Electronically Signed   By: Elon Alas M.D.   On: 04/22/2017 13:38   Ct Head Wo Contrast  Result Date: 04/22/2017 CLINICAL DATA:  Patient with neck pain.  Patient status post fall. EXAM: CT HEAD WITHOUT CONTRAST CT CERVICAL SPINE WITHOUT CONTRAST  TECHNIQUE: Multidetector CT imaging of the head and cervical spine was performed following the standard protocol without intravenous contrast. Multiplanar CT image reconstructions of the cervical spine were also generated. COMPARISON:  Brain CT 07/12/2013. FINDINGS: CT HEAD FINDINGS Brain: Ventricles and sulci are appropriate for patient's age. No evidence for acute cortically based infarct, intracranial hemorrhage, mass lesion or mass-effect. Vascular: Internal carotid arterial vascular calcifications. Skull: Intact. Sinuses/Orbits: Paranasal sinuses are well aerated. Mastoid air cells are unremarkable. Orbits are unremarkable. Other: None. CT CERVICAL SPINE FINDINGS Alignment: Grade 1 anterolisthesis of C3 on C4. Skull base and vertebrae: Intact. Soft tissues and spinal canal: No prevertebral fluid or swelling. No visible canal hematoma. Disc levels: Multilevel degenerative disc disease. Fusion at the C3-4 level. No acute fracture. Multilevel facet degenerative changes. Upper chest: Unremarkable. Other: None. IMPRESSION: No acute intracranial process. No acute cervical spine fracture. Multilevel degenerative disc disease. Osseous fusion C3-4. Electronically Signed   By: Lovey Newcomer M.D.   On: 04/22/2017 13:16   Ct Cervical Spine Wo Contrast  Result Date: 04/22/2017 CLINICAL DATA:  Patient with neck pain.  Patient status post fall. EXAM: CT HEAD WITHOUT CONTRAST CT CERVICAL SPINE WITHOUT CONTRAST TECHNIQUE: Multidetector CT imaging of the head and cervical spine was performed following the standard protocol without intravenous contrast. Multiplanar CT image reconstructions of the cervical spine were also generated. COMPARISON:  Brain CT 07/12/2013. FINDINGS: CT HEAD FINDINGS Brain: Ventricles and sulci are appropriate for patient's age. No evidence for acute cortically based infarct, intracranial hemorrhage, mass lesion or mass-effect. Vascular: Internal carotid arterial vascular calcifications. Skull: Intact.  Sinuses/Orbits: Paranasal sinuses are well aerated. Mastoid air cells are unremarkable. Orbits are unremarkable. Other: None. CT CERVICAL SPINE FINDINGS Alignment: Grade 1 anterolisthesis of C3 on C4. Skull base and vertebrae: Intact. Soft tissues and spinal canal:  No prevertebral fluid or swelling. No visible canal hematoma. Disc levels: Multilevel degenerative disc disease. Fusion at the C3-4 level. No acute fracture. Multilevel facet degenerative changes. Upper chest: Unremarkable. Other: None. IMPRESSION: No acute intracranial process. No acute cervical spine fracture. Multilevel degenerative disc disease. Osseous fusion C3-4. Electronically Signed   By: Lovey Newcomer M.D.   On: 04/22/2017 13:16   Ct Hip Left Wo Contrast  Result Date: 04/22/2017 CLINICAL DATA:  Recent fall with left hip pain. Questionable left femur fracture radiographs. EXAM: CT OF THE LEFT HIP WITHOUT CONTRAST TECHNIQUE: Multidetector CT imaging of the left hip was performed according to the standard protocol. Multiplanar CT image reconstructions were also generated. COMPARISON:  Left hip radiographs 04/22/2017 and 06/26/2015. FINDINGS: Bones/Joint/Cartilage The mineralization is adequate. There is no evidence of acute fracture, dislocation or left femoral head avascular necrosis. Mild left hip degenerative changes are present without significant joint effusion. Mild degenerative changes are also present at the left sacroiliac joint and L5-S1 disc space. Ligaments Suboptimally assessed by CT. Muscles and Tendons There is a least partial tearing of the left common hamstring tendon with partial tendon retraction into the posterior thigh. There is associated proximal left hamstring muscular fatty atrophy. There is some fatty atrophy within the left rectus femoris muscle as well. There is ossification along the insertion of the iliopsoas tendon. Soft tissues Iliac and femoral atherosclerosis. No evidence of periarticular hematoma. IMPRESSION: 1.  No evidence of acute left hip fracture or dislocation. 2. No definite acute posttraumatic findings. 3. Partial tearing of the left common hamstring tendon with associated muscular atrophy. Electronically Signed   By: Richardean Sale M.D.   On: 04/22/2017 15:27   Dg Knee Complete 4 Views Left  Result Date: 04/22/2017 CLINICAL DATA:  Recent fall with left knee pain, initial encounter EXAM: LEFT KNEE - COMPLETE 4+ VIEW COMPARISON:  None. FINDINGS: There is a comminuted fracture identified in the distal left femoral metaphysis with lateral displacement of the distal fracture fragments as well as the femoral portion of the knee prosthesis. Some impaction is also noted at the fracture site. The proximal tibia and fibula appear within normal limits with the tibial prosthesis is intact. IMPRESSION: Comminuted distal metaphyseal fracture of the left femur as described. Electronically Signed   By: Inez Catalina M.D.   On: 04/22/2017 13:42   Dg Hip Unilat W Or Wo Pelvis 2-3 Views Left  Result Date: 04/22/2017 CLINICAL DATA:  Recent falls with left hip pain, initial encounter EXAM: DG HIP (WITH OR WITHOUT PELVIS) 2-3V LEFT COMPARISON:  06/26/2015 FINDINGS: Pelvic ring is well visualized and intact. No dislocation is seen. Degenerative changes of lumbar spine and hip joints are noted bilaterally. There is a vertical lucency through the intratrochanteric region seen only on the oblique image. The possibility of an undisplaced fracture could not be totally excluded. If clinical suspicion is high a CT of the pelvis may be helpful for further evaluation. IMPRESSION: Degenerative changes of lumbar spine and hip joints. Vertical lucency through the proximal femur as described only on the oblique image. If clinically indicated CT of the pelvis may be helpful for further evaluation. Electronically Signed   By: Inez Catalina M.D.   On: 04/22/2017 13:41    EKG: Pending at time of admission  Assessment/Plan Principal Problem:    Femur fracture, left (HCC) Active Problems:   Obstructive sleep apnea   Essential hypertension   Atrial fibrillation (HCC)   CIDP (chronic inflammatory demyelinating polyneuropathy) ([place])  Vitamin D deficiency   GERD (gastroesophageal reflux disease)   #1 left femur fracture related to mechanical fall. X-ray reveals left. Prosthetic femur fracture. Evaluated by orthopedic surgery who request a CT of that joint and noted likely surgical repair tomorrow -Admit to [facility] a chest x-ray -EKG -INR -Pain management -Buck's traction per flow -Nothing by mouth past midnight  #2. Hypertension. Blood pressure low end of normal in the emergency department. Home medications include Coreg, and Demadex. -Hold Demadex for now -Continue Coreg with parameters -Monitor  #3. Atrial fibrillation. [place] score 4. Patient home medications include L request. -hold eliquis for now -Obtain an EKG -Continue beta blocker  #4. Obstructive sleep apnea  #5. Hyperlipidemia/ CAD/history of MI 2 in 1997 and 1998 status post PCA/stent in 2012. Chart review indicates patient's hyperlipidemia is controlled with diet and medications. No chest pain. Rate controlled. EKG pending -Continue home meds  #6. GERD. -PPI     DVT prophylaxis: scd  Code Status: full  Family Communication: daughter at bedside  Disposition Plan: snf  Consults called: micheal jeff[facility] ortho  Admission status: inpatient    Radene Gunning MD [facility]  If 7PM-7AM, please contact night-coverage [email or web address] Password [email or web address]  04/22/2017, 3:35 PM

## 2017-04-22 NOTE — ED Notes (Signed)
Pt returned from xray, family at bedside now

## 2017-04-22 NOTE — Consult Note (Signed)
Reason for Consult:Femur fx Referring Physician: E Schlossman  [name] is an 77 y.o. male.  HPI: [name] fell coming out of the bathroom last night. He was brought to the ED where x-rays showed a left periprosthetic femur fx and orthopedic surgery was consulted. He has fairly advanced dementia and history provided by family at bedside.  Past Medical History:  Diagnosis Date  . Anemia   . Anxiety   . Arthritis    knees  . Benign prostatic hypertrophy with urinary obstruction   . Blood transfusion 1 yr. ago   jerking,fever-after blood transfusion  . Blood transfusion    given wrong blood  . Bradycardia    hx of bradycardia in past per ov note of 12/12  . CAS (cerebral atherosclerosis)   . Coronary artery disease 1998   cardiac stent/ Clearance Dr Arnoldo Morale and Einar Gip with note on chart  . Depression    takes Zoloft  . Dysrhythmia    hx of atrial fib per ov note of 12/12   . Erectile dysfunction   . Family history of early CAD    male 1st degree relative <50  . GERD (gastroesophageal reflux disease)   . HA (headache)    sinus headaches  . Hyperlipidemia   . Hypertension   . Low back pain   . Myocardial infarction ([place]) 1997   Hx [place] and 1998  . Neuropathy, autonomic, idiopathic peripheral, other   . Peptic ulcer disease   . Pneumonia 2005  . Preoperative cardiovascular examination   . Preventative health care   . Sleep apnea, obstructive    CPAP-does not use  . Spinal stenosis, lumbar   . Superficial spreading melanoma     Past Surgical History:  Procedure Laterality Date  . BACK SURGERY  2005-last   lumbar x2  . COLONOSCOPY  2004  . [place]   . CORONARY STENT PLACEMENT    . decompression of the median nerve     left wrist and hand  . ESOPHAGOGASTRODUODENOSCOPY  2004  . EXCISIONAL TOTAL KNEE ARTHROPLASTY  12/25/2010   Procedure: EXCISIONAL TOTAL KNEE ARTHROPLASTY;  Surgeon: Mauri Pole;  Location: [facility];  Service:  Orthopedics;  Laterality: Right;  repeat irrigation   and debridement, removal and reinsertation of spacer block  . I&D EXTREMITY  03/25/2011   Procedure: IRRIGATION AND DEBRIDEMENT EXTREMITY;  Surgeon: Mauri Pole, MD;  Location: [facility];  Service: Orthopedics;  Laterality: Right;  . JOINT REPLACEMENT  2011   left knee x 2 ; 08/2010-right knee-infected  . KNEE ARTHROSCOPY     left  . KNEE ARTHROSCOPY     right  . LUMBAR FUSION    . LUMBAR LAMINECTOMY    . revision of tkr  2011   on left  . SHOULDER ARTHROSCOPY     right  . TOTAL KNEE REVISION  05/13/2011   Procedure: TOTAL KNEE REVISION;  Surgeon: Mauri Pole, MD;  Location: [facility];  Service: Orthopedics;  Laterality: Right;  Reimplantation    Family History  Problem Relation Age of Onset  . Heart disease Father   . Stroke Mother   . Diabetes Sister   . Breast cancer Sister   . Heart disease Brother   . Breast cancer Sister     Social History:  reports that he quit smoking about 7 years ago. His smoking use included cigars. He has a 15.00 pack-year smoking history. he has never  used smokeless tobacco. He reports that he does not drink alcohol or use drugs.  Allergies:  Allergies  Allergen Reactions  . Acetaminophen Other (See Comments)    LIVER INFLAMMATION IN HIGH DOSES  . Lasix [Furosemide] Other (See Comments)    Could not function / could not get out of bed.    Medications: I have reviewed the patient's current medications.  Results for orders placed or performed during the hospital encounter of 04/22/17 (from the past 48 hour(s))  Basic metabolic panel     Status: Abnormal   Collection Time: 04/22/17 12:04 PM  Result Value Ref Range   Sodium 136 135 - 145 mmol/L   Potassium 4.1 3.5 - 5.1 mmol/L   Chloride 103 101 - 111 mmol/L   CO2 22 22 - 32 mmol/L   Glucose, Bld 115 (H) 65 - 99 mg/dL   BUN 26 (H) 6 - 20 mg/dL   Creatinine, Ser 1.70 (H) 0.61 - 1.24 mg/dL   Calcium 9.2 8.9 - 10.3 mg/dL   GFR calc non Af  Amer 37 (L) >60 mL/min   GFR calc Af Amer 43 (L) >60 mL/min    Comment: (NOTE) The eGFR has been calculated using the CKD EPI equation. This calculation has not been validated in all clinical situations. eGFR's persistently <60 mL/min signify possible Chronic Kidney Disease.    Anion gap 11 5 - 15    Comment: Performed at [facility] [address], [place], [place] [address]  CBC with Differential     Status: Abnormal   Collection Time: 04/22/17 12:04 PM  Result Value Ref Range   WBC 12.7 (H) 4.0 - 10.5 K/uL   RBC 3.73 (L) 4.22 - 5.81 MIL/uL   Hemoglobin 10.0 (L) 13.0 - 17.0 g/dL   HCT 31.9 (L) 39.0 - 52.0 %   MCV 85.5 78.0 - 100.0 fL   MCH 26.8 26.0 - 34.0 pg   MCHC 31.3 30.0 - 36.0 g/dL   RDW 18.2 (H) 11.5 - 15.5 %   Platelets 220 150 - 400 K/uL   Neutrophils Relative % 77 %   Neutro Abs 9.9 (H) 1.7 - 7.7 K/uL   Lymphocytes Relative 15 %   Lymphs Abs 1.9 0.7 - 4.0 K/uL   Monocytes Relative 7 %   Monocytes Absolute 0.8 0.1 - 1.0 K/uL   Eosinophils Relative 1 %   Eosinophils Absolute 0.1 0.0 - 0.7 K/uL   Basophils Relative 0 %   Basophils Absolute 0.0 0.0 - 0.1 K/uL    Comment: Performed at [facility] [address] [place], [place] [address]    Dg Lumbar Spine Complete  Result Date: 04/22/2017 CLINICAL DATA:  [place] last night, LEFT knee pain. History of lumbar spine surgery. EXAM: LUMBAR SPINE - COMPLETE 4+ VIEW COMPARISON:  Lumbar spine radiographs July 25, 2015 FINDINGS: Stable broad dextroscoliosis. No acute fracture deformity. No malalignment. Severe L1-2 disc height loss with endplate sclerosis in subsidence. Severe L2-3 and L3-4 similar disc height loss with vacuum disc, endplate sclerosis and marginal spurring. Severe lower lumbar facet arthropathy. No destructive bony lesions. Ankylosis of the sacroiliac joints. Prevertebral and paraspinal soft tissue planes are nonacute. 3.3 cm heavily calcified infrarenal aortic aneurysm. IMPRESSION: No acute fracture  deformity or malalignment. Stable marked endplate irregularity and subsidence L1-2 seen with old discitis osteomyelitis and/or severe degenerative disc. Stable overall degenerative change of the lumbar spine. 3.3 cm infrarenal aortic aneurysm for which follow-up abdominal ultrasound or CT angiogram  of the abdomen is recommended. Aortic Atherosclerosis (ICD10-I70.0). Electronically Signed   By: Elon Alas M.D.   On: 04/22/2017 13:38   Ct Head Wo Contrast  Result Date: 04/22/2017 CLINICAL DATA:  Patient with neck pain.  Patient status post fall. EXAM: CT HEAD WITHOUT CONTRAST CT CERVICAL SPINE WITHOUT CONTRAST TECHNIQUE: Multidetector CT imaging of the head and cervical spine was performed following the standard protocol without intravenous contrast. Multiplanar CT image reconstructions of the cervical spine were also generated. COMPARISON:  Brain CT 07/12/2013. FINDINGS: CT HEAD FINDINGS Brain: Ventricles and sulci are appropriate for patient's age. No evidence for acute cortically based infarct, intracranial hemorrhage, mass lesion or mass-effect. Vascular: Internal carotid arterial vascular calcifications. Skull: Intact. Sinuses/Orbits: Paranasal sinuses are well aerated. Mastoid air cells are unremarkable. Orbits are unremarkable. Other: None. CT CERVICAL SPINE FINDINGS Alignment: Grade 1 anterolisthesis of C3 on C4. Skull base and vertebrae: Intact. Soft tissues and spinal canal: No prevertebral fluid or swelling. No visible canal hematoma. Disc levels: Multilevel degenerative disc disease. Fusion at the C3-4 level. No acute fracture. Multilevel facet degenerative changes. Upper chest: Unremarkable. Other: None. IMPRESSION: No acute intracranial process. No acute cervical spine fracture. Multilevel degenerative disc disease. Osseous fusion C3-4. Electronically Signed   By: Lovey Newcomer M.D.   On: 04/22/2017 13:16   Ct Cervical Spine Wo Contrast  Result Date: 04/22/2017 CLINICAL DATA:  Patient with  neck pain.  Patient status post fall. EXAM: CT HEAD WITHOUT CONTRAST CT CERVICAL SPINE WITHOUT CONTRAST TECHNIQUE: Multidetector CT imaging of the head and cervical spine was performed following the standard protocol without intravenous contrast. Multiplanar CT image reconstructions of the cervical spine were also generated. COMPARISON:  Brain CT 07/12/2013. FINDINGS: CT HEAD FINDINGS Brain: Ventricles and sulci are appropriate for patient's age. No evidence for acute cortically based infarct, intracranial hemorrhage, mass lesion or mass-effect. Vascular: Internal carotid arterial vascular calcifications. Skull: Intact. Sinuses/Orbits: Paranasal sinuses are well aerated. Mastoid air cells are unremarkable. Orbits are unremarkable. Other: None. CT CERVICAL SPINE FINDINGS Alignment: Grade 1 anterolisthesis of C3 on C4. Skull base and vertebrae: Intact. Soft tissues and spinal canal: No prevertebral fluid or swelling. No visible canal hematoma. Disc levels: Multilevel degenerative disc disease. Fusion at the C3-4 level. No acute fracture. Multilevel facet degenerative changes. Upper chest: Unremarkable. Other: None. IMPRESSION: No acute intracranial process. No acute cervical spine fracture. Multilevel degenerative disc disease. Osseous fusion C3-4. Electronically Signed   By: Lovey Newcomer M.D.   On: 04/22/2017 13:16   Dg Knee Complete 4 Views Left  Result Date: 04/22/2017 CLINICAL DATA:  Recent fall with left knee pain, initial encounter EXAM: LEFT KNEE - COMPLETE 4+ VIEW COMPARISON:  None. FINDINGS: There is a comminuted fracture identified in the distal left femoral metaphysis with lateral displacement of the distal fracture fragments as well as the femoral portion of the knee prosthesis. Some impaction is also noted at the fracture site. The proximal tibia and fibula appear within normal limits with the tibial prosthesis is intact. IMPRESSION: Comminuted distal metaphyseal fracture of the left femur as described.  Electronically Signed   By: Inez Catalina M.D.   On: 04/22/2017 13:42   Dg Hip Unilat W Or Wo Pelvis 2-3 Views Left  Result Date: 04/22/2017 CLINICAL DATA:  Recent falls with left hip pain, initial encounter EXAM: DG HIP (WITH OR WITHOUT PELVIS) 2-3V LEFT COMPARISON:  06/26/2015 FINDINGS: Pelvic ring is well visualized and intact. No dislocation is seen. Degenerative changes of lumbar spine and hip  joints are noted bilaterally. There is a vertical lucency through the intratrochanteric region seen only on the oblique image. The possibility of an undisplaced fracture could not be totally excluded. If clinical suspicion is high a CT of the pelvis may be helpful for further evaluation. IMPRESSION: Degenerative changes of lumbar spine and hip joints. Vertical lucency through the proximal femur as described only on the oblique image. If clinically indicated CT of the pelvis may be helpful for further evaluation. Electronically Signed   By: Inez Catalina M.D.   On: 04/22/2017 13:41    Review of Systems  Unable to perform ROS: Dementia  Musculoskeletal: Positive for joint pain (Left knee).   Blood pressure (!) 106/53, pulse 86, temperature 98.5 F (36.9 C), temperature source Oral, resp. rate 18, SpO2 95 %. Physical Exam  Constitutional: He appears well-developed and well-nourished. No distress.  HENT:  Head: Normocephalic and atraumatic.  Eyes: Conjunctivae are normal. Right eye exhibits no discharge. Left eye exhibits no discharge. No scleral icterus.  Neck: Normal range of motion.  Cardiovascular: Normal rate and regular rhythm.  Respiratory: Effort normal. No respiratory distress.  Musculoskeletal:  RLE No traumatic wounds, ecchymosis, or rash  Nontender  No knee or ankle effusion  Knee stable to varus/ valgus and anterior/posterior stress  Sens DPN, SPN, TN paresthetic  Motor EHL, ext, flex, evers 5/5  DP 2+, PT 2+, No significant edema  LLE No traumatic wounds, ecchymosis, or rash  Mod TTP  knee  No ankle effusion  Sens DPN, TN paresthetic, SPN absent  Motor EHL 2/5, ext, flex, evers 5/5  DP 2+, PT 2+, No significant edema  Neurological: He is alert.  Skin: Skin is warm and dry. He is not diaphoretic.  Psychiatric: He has a normal mood and affect. His behavior is normal.    Assessment/Plan: Fall Left distal periprosthetic femur fx -- Will need ORIF, likely tomorrow with either Dr. Griffin Basil or Dr. Doreatha Martin. Please keep NPO after MN. Will place in [place] traction overnight. Multiple medical problems -- per IM who will admit    Lisette Abu, PA-C Orthopedic Surgery [phone] 04/22/2017, 2:12 PM

## 2017-04-22 NOTE — ED Notes (Signed)
Paged admitting 2x, RN requesting order for type & screen

## 2017-04-23 ENCOUNTER — Other Ambulatory Visit: Payer: Self-pay

## 2017-04-23 ENCOUNTER — Inpatient Hospital Stay (HOSPITAL_COMMUNITY): Payer: Medicare Other

## 2017-04-23 ENCOUNTER — Inpatient Hospital Stay (HOSPITAL_COMMUNITY): Payer: Medicare Other | Admitting: Certified Registered Nurse Anesthetist

## 2017-04-23 ENCOUNTER — Encounter (HOSPITAL_COMMUNITY)
Admission: EM | Disposition: A | Discharge status: Skilled Nursing Facility | Payer: Self-pay | Source: Home / Self Care | Attending: Internal Medicine

## 2017-04-23 ENCOUNTER — Encounter (HOSPITAL_COMMUNITY): Payer: Self-pay | Admitting: Surgery

## 2017-04-23 DIAGNOSIS — I482 Chronic atrial fibrillation: Secondary | ICD-10-CM

## 2017-04-23 HISTORY — PX: ORIF FEMUR FRACTURE: SHX2119

## 2017-04-23 LAB — BASIC METABOLIC PANEL
Anion gap: 11 (ref 5–15)
BUN: 29 mg/dL — AB (ref 6–20)
CALCIUM: 8.7 mg/dL — AB (ref 8.9–10.3)
CO2: 25 mmol/L (ref 22–32)
CREATININE: 2.01 mg/dL — AB (ref 0.61–1.24)
Chloride: 100 mmol/L — ABNORMAL LOW (ref 101–111)
GFR calc Af Amer: 35 mL/min — ABNORMAL LOW (ref >60)
GFR, EST NON AFRICAN AMERICAN: 30 mL/min — AB (ref >60)
GLUCOSE: 137 mg/dL — AB (ref 65–99)
Potassium: 3.8 mmol/L (ref 3.5–5.1)
SODIUM: 136 mmol/L (ref 135–145)

## 2017-04-23 LAB — CBC
HCT: 29.6 % — ABNORMAL LOW (ref 39.0–52.0)
Hemoglobin: 9.1 g/dL — ABNORMAL LOW (ref 13.0–17.0)
MCH: 26.4 pg (ref 26.0–34.0)
MCHC: 30.7 g/dL (ref 30.0–36.0)
MCV: 85.8 fL (ref 78.0–100.0)
Platelets: 208 10*3/uL (ref 150–400)
RBC: 3.45 MIL/uL — AB (ref 4.22–5.81)
RDW: 18.4 % — AB (ref 11.5–15.5)
WBC: 11 10*3/uL — ABNORMAL HIGH (ref 4.0–10.5)

## 2017-04-23 LAB — PROTIME-INR
INR: 1.72
PROTHROMBIN TIME: 20 s — AB (ref 11.4–15.2)

## 2017-04-23 LAB — SURGICAL PCR SCREEN
MRSA, PCR: NEGATIVE
STAPHYLOCOCCUS AUREUS: NEGATIVE

## 2017-04-23 SURGERY — OPEN REDUCTION INTERNAL FIXATION (ORIF) DISTAL FEMUR FRACTURE
Anesthesia: General | Laterality: Left

## 2017-04-23 MED ORDER — KETOROLAC TROMETHAMINE 15 MG/ML IJ SOLN
15.0000 mg | Freq: Once | INTRAMUSCULAR | Status: AC
  Administered 2017-04-23: 15 mg via INTRAVENOUS
  Filled 2017-04-23: qty 1

## 2017-04-23 MED ORDER — PHENYLEPHRINE HCL 10 MG/ML IJ SOLN
INTRAMUSCULAR | Status: DC | PRN
  Administered 2017-04-23: 25 ug/min via INTRAVENOUS

## 2017-04-23 MED ORDER — VANCOMYCIN HCL 1000 MG IV SOLR
INTRAVENOUS | Status: AC
  Filled 2017-04-23: qty 1000

## 2017-04-23 MED ORDER — FENTANYL CITRATE (PF) 250 MCG/5ML IJ SOLN
INTRAMUSCULAR | Status: AC
Start: 2017-04-23 — End: 2017-04-23
  Filled 2017-04-23: qty 5

## 2017-04-23 MED ORDER — CHLORHEXIDINE GLUCONATE 4 % EX LIQD
60.0000 mL | Freq: Once | CUTANEOUS | Status: AC
  Administered 2017-04-23: 4 via TOPICAL
  Filled 2017-04-23: qty 60

## 2017-04-23 MED ORDER — CEFAZOLIN SODIUM-DEXTROSE 2-4 GM/100ML-% IV SOLN
2.0000 g | INTRAVENOUS | Status: DC
  Filled 2017-04-23: qty 100

## 2017-04-23 MED ORDER — FENTANYL CITRATE (PF) 100 MCG/2ML IJ SOLN
INTRAMUSCULAR | Status: DC | PRN
  Administered 2017-04-23 (×2): 50 ug via INTRAVENOUS

## 2017-04-23 MED ORDER — LACTATED RINGERS IV SOLN: INTRAVENOUS | Status: DC | PRN

## 2017-04-23 MED ORDER — VANCOMYCIN HCL 1000 MG IV SOLR
INTRAVENOUS | Status: DC | PRN
  Administered 2017-04-23: 1000 mg via TOPICAL

## 2017-04-23 MED ORDER — EPHEDRINE 5 MG/ML INJ
INTRAVENOUS | Status: AC
  Filled 2017-04-23: qty 10

## 2017-04-23 MED ORDER — SODIUM CHLORIDE 0.9 % IV BOLUS (SEPSIS)
500.0000 mL | Freq: Once | INTRAVENOUS | Status: AC
  Administered 2017-04-23: 500 mL via INTRAVENOUS

## 2017-04-23 MED ORDER — PROPOFOL 10 MG/ML IV BOLUS
INTRAVENOUS | Status: DC | PRN
  Administered 2017-04-23: 60 mg via INTRAVENOUS

## 2017-04-23 MED ORDER — ROCURONIUM BROMIDE 10 MG/ML (PF) SYRINGE
PREFILLED_SYRINGE | INTRAVENOUS | Status: AC
  Filled 2017-04-23: qty 5

## 2017-04-23 MED ORDER — PHENYLEPHRINE 40 MCG/ML (10ML) SYRINGE FOR IV PUSH (FOR BLOOD PRESSURE SUPPORT)
PREFILLED_SYRINGE | INTRAVENOUS | Status: DC | PRN
  Administered 2017-04-23 (×2): 120 ug via INTRAVENOUS
  Administered 2017-04-23 (×2): 80 ug via INTRAVENOUS

## 2017-04-23 MED ORDER — ALBUMIN HUMAN 5 % IV SOLN
INTRAVENOUS | Status: DC | PRN
  Administered 2017-04-23: 16:00:00 via INTRAVENOUS

## 2017-04-23 MED ORDER — 0.9 % SODIUM CHLORIDE (POUR BTL) OPTIME
TOPICAL | Status: DC | PRN
  Administered 2017-04-23: 1000 mL

## 2017-04-23 MED ORDER — CEFAZOLIN SODIUM-DEXTROSE 2-4 GM/100ML-% IV SOLN
2.0000 g | Freq: Three times a day (TID) | INTRAVENOUS | Status: AC
  Administered 2017-04-23 – 2017-04-24 (×3): 2 g via INTRAVENOUS
  Filled 2017-04-23 (×3): qty 100

## 2017-04-23 MED ORDER — SODIUM CHLORIDE 0.9 % IV SOLN
INTRAVENOUS | Status: DC | PRN
  Administered 2017-04-23: 15:00:00 via INTRAVENOUS

## 2017-04-23 MED ORDER — EPHEDRINE SULFATE-NACL 50-0.9 MG/10ML-% IV SOSY
PREFILLED_SYRINGE | INTRAVENOUS | Status: DC | PRN
  Administered 2017-04-23 (×2): 10 mg via INTRAVENOUS

## 2017-04-23 MED ORDER — DEXTROSE-NACL 5-0.9 % IV SOLN
INTRAVENOUS | Status: DC
Start: 2017-04-23 — End: 2017-04-24
  Administered 2017-04-23: 10:00:00 via INTRAVENOUS

## 2017-04-23 MED ORDER — CEFAZOLIN SODIUM-DEXTROSE 2-3 GM-%(50ML) IV SOLR
INTRAVENOUS | Status: DC | PRN
  Administered 2017-04-23: 2 g via INTRAVENOUS

## 2017-04-23 MED ORDER — BACITRACIN ZINC 500 UNIT/GM EX OINT
TOPICAL_OINTMENT | CUTANEOUS | Status: DC | PRN
  Administered 2017-04-23: 1 via TOPICAL

## 2017-04-23 MED ORDER — SODIUM CHLORIDE 0.9 % IV SOLN
INTRAVENOUS | Status: DC
Start: 2017-04-23 — End: 2017-04-26
  Administered 2017-04-23: 14:00:00 via INTRAVENOUS

## 2017-04-23 MED ORDER — LACTATED RINGERS IV SOLN
INTRAVENOUS | Status: DC | PRN
  Administered 2017-04-23: 16:00:00 via INTRAVENOUS

## 2017-04-23 MED ORDER — NEOSTIGMINE METHYLSULFATE 5 MG/5ML IV SOSY
PREFILLED_SYRINGE | INTRAVENOUS | Status: DC | PRN
  Administered 2017-04-23: 3 mg via INTRAVENOUS

## 2017-04-23 MED ORDER — TOBRAMYCIN SULFATE 1.2 G IJ SOLR
INTRAMUSCULAR | Status: AC
  Filled 2017-04-23: qty 1.2

## 2017-04-23 MED ORDER — ROCURONIUM BROMIDE 10 MG/ML (PF) SYRINGE
PREFILLED_SYRINGE | INTRAVENOUS | Status: DC | PRN
  Administered 2017-04-23: 10 mg via INTRAVENOUS
  Administered 2017-04-23: 50 mg via INTRAVENOUS

## 2017-04-23 MED ORDER — LIDOCAINE 2% (20 MG/ML) 5 ML SYRINGE
INTRAMUSCULAR | Status: DC | PRN
  Administered 2017-04-23: 30 mg via INTRAVENOUS

## 2017-04-23 MED ORDER — ADULT MULTIVITAMIN W/MINERALS CH
1.0000 | ORAL_TABLET | Freq: Every day | ORAL | Status: DC
  Administered 2017-04-24 – 2017-04-26 (×3): 1 via ORAL
  Filled 2017-04-23 (×3): qty 1

## 2017-04-23 MED ORDER — PHENYLEPHRINE 40 MCG/ML (10ML) SYRINGE FOR IV PUSH (FOR BLOOD PRESSURE SUPPORT)
PREFILLED_SYRINGE | INTRAVENOUS | Status: AC
  Filled 2017-04-23: qty 10

## 2017-04-23 MED ORDER — DEXAMETHASONE SODIUM PHOSPHATE 10 MG/ML IJ SOLN
INTRAMUSCULAR | Status: DC | PRN
  Administered 2017-04-23: 5 mg via INTRAVENOUS

## 2017-04-23 MED ORDER — BACITRACIN ZINC 500 UNIT/GM EX OINT
TOPICAL_OINTMENT | CUTANEOUS | Status: AC
  Filled 2017-04-23: qty 28.35

## 2017-04-23 MED ORDER — POVIDONE-IODINE 10 % EX SWAB
2.0000 "application " | Freq: Once | CUTANEOUS | Status: AC
  Administered 2017-04-23: 2 via TOPICAL

## 2017-04-23 MED ORDER — GLYCOPYRROLATE 0.2 MG/ML IV SOSY
PREFILLED_SYRINGE | INTRAVENOUS | Status: DC | PRN
  Administered 2017-04-23: .6 mg via INTRAVENOUS
  Administered 2017-04-23: .2 mg via INTRAVENOUS

## 2017-04-23 MED ORDER — LIDOCAINE HCL 2 % EX GEL
1.0000 "application " | Freq: Once | CUTANEOUS | Status: AC
  Administered 2017-04-23: 1 via URETHRAL
  Filled 2017-04-23: qty 5

## 2017-04-23 SURGICAL SUPPLY — 69 items
BANDAGE ACE 6X5 VEL STRL LF (GAUZE/BANDAGES/DRESSINGS) ×2 IMPLANT
BANDAGE ELASTIC 4 VELCRO ST LF (GAUZE/BANDAGES/DRESSINGS) ×2 IMPLANT
BIT DRILL 4.3 (BIT) ×2
BIT DRILL 4.3MM (BIT) ×1
BIT DRILL 4.3X300MM (BIT) IMPLANT
BIT DRILL LONG 3.3 (BIT) ×1 IMPLANT
BIT DRILL LONG 3.3MM (BIT) ×1
BIT DRILL NCB-PP FEMUR MIS 3 (DRILL) IMPLANT
BIT DRILL QC 3.3X195 (BIT) ×2 IMPLANT
BLADE CLIPPER SURG (BLADE) IMPLANT
BNDG CMPR MED 10X6 ELC LF (GAUZE/BANDAGES/DRESSINGS) ×1
BNDG COHESIVE 6X5 TAN STRL LF (GAUZE/BANDAGES/DRESSINGS) ×3 IMPLANT
BNDG ELASTIC 6X10 VLCR STRL LF (GAUZE/BANDAGES/DRESSINGS) ×3 IMPLANT
BNDG GAUZE ELAST 4 BULKY (GAUZE/BANDAGES/DRESSINGS) ×2 IMPLANT
BRUSH SCRUB SURG 4.25 DISP (MISCELLANEOUS) ×6 IMPLANT
CANISTER SUCT 3000ML PPV (MISCELLANEOUS) ×3 IMPLANT
CAP LOCK NCB (Cap) ×18 IMPLANT
CHLORAPREP W/TINT 26ML (MISCELLANEOUS) ×5 IMPLANT
COVER SURGICAL LIGHT HANDLE (MISCELLANEOUS) ×3 IMPLANT
DRAPE C-ARM 42X72 X-RAY (DRAPES) ×3 IMPLANT
DRAPE C-ARMOR (DRAPES) ×3 IMPLANT
DRAPE ORTHO SPLIT 77X108 STRL (DRAPES) ×9
DRAPE SURG 17X23 STRL (DRAPES) ×3 IMPLANT
DRAPE SURG ORHT 6 SPLT 77X108 (DRAPES) ×2 IMPLANT
DRAPE U-SHAPE 47X51 STRL (DRAPES) ×3 IMPLANT
DRILL NCB-PP FEMUR MIS 3 (DRILL) ×3
DRSG ADAPTIC 3X8 NADH LF (GAUZE/BANDAGES/DRESSINGS) ×2 IMPLANT
DRSG MEPILEX BORDER 4X12 (GAUZE/BANDAGES/DRESSINGS) ×2 IMPLANT
DRSG MEPILEX BORDER 4X4 (GAUZE/BANDAGES/DRESSINGS) IMPLANT
DRSG MEPILEX BORDER 4X8 (GAUZE/BANDAGES/DRESSINGS) IMPLANT
DRSG PAD ABDOMINAL 8X10 ST (GAUZE/BANDAGES/DRESSINGS) ×8 IMPLANT
ELECT REM PT RETURN 9FT ADLT (ELECTROSURGICAL) ×3
ELECTRODE REM PT RTRN 9FT ADLT (ELECTROSURGICAL) ×1 IMPLANT
GAUZE SPONGE 4X4 12PLY STRL (GAUZE/BANDAGES/DRESSINGS) ×3 IMPLANT
GLOVE BIO SURGEON STRL SZ7.5 (GLOVE) ×12 IMPLANT
GLOVE BIOGEL PI IND STRL 7.5 (GLOVE) ×1 IMPLANT
GLOVE BIOGEL PI INDICATOR 7.5 (GLOVE) ×2
GOWN STRL REUS W/ TWL LRG LVL3 (GOWN DISPOSABLE) ×3 IMPLANT
GOWN STRL REUS W/TWL LRG LVL3 (GOWN DISPOSABLE) ×9
K-WIRE 2.0 (WIRE) ×3
K-WIRE FXSTD 280X2XNS SS (WIRE) ×1
KIT BASIN OR (CUSTOM PROCEDURE TRAY) ×3 IMPLANT
KIT ROOM TURNOVER OR (KITS) ×3 IMPLANT
KWIRE FXSTD 280X2XNS SS (WIRE) IMPLANT
NS IRRIG 1000ML POUR BTL (IV SOLUTION) ×3 IMPLANT
PACK TOTAL JOINT (CUSTOM PROCEDURE TRAY) ×3 IMPLANT
PAD ARMBOARD 7.5X6 YLW CONV (MISCELLANEOUS) ×6 IMPLANT
PAD CAST 4YDX4 CTTN HI CHSV (CAST SUPPLIES) ×1 IMPLANT
PADDING CAST COTTON 4X4 STRL (CAST SUPPLIES) ×3
PADDING CAST COTTON 6X4 STRL (CAST SUPPLIES) ×3 IMPLANT
PLATE DIST FEM 12H (Plate) ×2 IMPLANT
SCREW 5.0 80MM (Screw) ×4 IMPLANT
SCREW CORT NCB SELFTAP 5.0X42 (Screw) ×4 IMPLANT
SCREW CORTICAL NCB 5.0X40 (Screw) ×2 IMPLANT
SCREW NCB 4.0MX42M (Screw) ×2 IMPLANT
SCREW NCB 4.0X40MM (Screw) ×2 IMPLANT
SCREW NCB 5.0X85MM (Screw) ×8 IMPLANT
SPONGE LAP 18X18 X RAY DECT (DISPOSABLE) ×3 IMPLANT
STAPLER VISISTAT 35W (STAPLE) ×3 IMPLANT
SUCTION FRAZIER HANDLE 10FR (MISCELLANEOUS) ×2
SUCTION TUBE FRAZIER 10FR DISP (MISCELLANEOUS) ×1 IMPLANT
SUT ETHILON 3 0 PS 1 (SUTURE) ×8 IMPLANT
SUT VIC AB 1 CT1 27 (SUTURE) ×6
SUT VIC AB 1 CT1 27XBRD ANBCTR (SUTURE) ×2 IMPLANT
SUT VIC AB 2-0 CT1 27 (SUTURE) ×6
SUT VIC AB 2-0 CT1 TAPERPNT 27 (SUTURE) ×2 IMPLANT
TOWEL OR 17X26 10 PK STRL BLUE (TOWEL DISPOSABLE) ×6 IMPLANT
TRAY FOLEY W/METER SILVER 16FR (SET/KITS/TRAYS/PACK) IMPLANT
WATER STERILE IRR 1000ML POUR (IV SOLUTION) ×6 IMPLANT

## 2017-04-23 NOTE — Progress Notes (Signed)
Orthopedic Tech Progress Note Patient Details:  [name] [birth year] [id number]  Musculoskeletal Traction Type of Traction: [place] Skin Traction Traction Location: lle Traction Weight: 5 lbs   Post Interventions Patient Tolerated: Well Instructions Provided: Care of device   Karolee Stamps 04/23/2017, 1:16 AM

## 2017-04-23 NOTE — Progress Notes (Signed)
Orthopedic Tech Progress Note Patient Details:  [name] November 29, 1940 [id number]  Patient ID: [name], male   DOB: [birth year], 77 y.o.   MRN: [id number] Pt cant have ohf due to age restrictions  Karolee Stamps 04/23/2017, 3:30 AM

## 2017-04-23 NOTE — Transfer of Care (Signed)
Immediate Anesthesia Transfer of Care Note  Patient: [name]  Procedure(s) Performed: OPEN REDUCTION INTERNAL FIXATION (ORIF) DISTAL FEMUR FRACTURE (Left )  Patient Location: PACU  Anesthesia Type:General  Level of Consciousness: awake and alert   Airway & Oxygen Therapy: Patient Spontanous Breathing and Patient connected to face mask oxygen  Post-op Assessment: Report given to RN and Post -op Vital signs reviewed and stable  Post vital signs: Reviewed and stable  Last Vitals:  Vitals:   04/23/17 1315 04/23/17 1752  BP: 107/64   Pulse: 72 77  Resp: 18 (!) 22  Temp: 36.9 C 36.8 C  SpO2: 97% 97%    Last Pain:  Vitals:   04/23/17 1315  TempSrc: Oral  PainSc:       Patients Stated Pain Goal: 2 (14/97/02 6378)  Complications: No apparent anesthesia complications

## 2017-04-23 NOTE — Anesthesia Procedure Notes (Signed)
Procedure Name: Intubation Date/Time: 04/23/2017 3:28 PM Performed by: Colin Benton, CRNA Pre-anesthesia Checklist: Patient identified, Emergency Drugs available, Suction available and Patient being monitored Patient Re-evaluated:Patient Re-evaluated prior to induction Oxygen Delivery Method: Circle system utilized Preoxygenation: Pre-oxygenation with 100% oxygen Induction Type: IV induction Ventilation: Mask ventilation without difficulty Laryngoscope Size: Mac and 3 Grade View: Grade I Tube type: Oral Tube size: 7.5 mm Number of attempts: 1 Airway Equipment and Method: Stylet Placement Confirmation: ETT inserted through vocal cords under direct vision,  positive ETCO2 and breath sounds checked- equal and bilateral Secured at: 23 cm Tube secured with: Tape Dental Injury: Teeth and Oropharynx as per pre-operative assessment  Comments: Intubation performed by Elliot Dally, paramedic student under supervision of Dr. Glennon Mac.

## 2017-04-23 NOTE — Progress Notes (Signed)
Patient ID: [name], male   DOB: Dec 13, 1940, 77 y.o.   MRN: [id number]   LOS: 1 day   Subjective: Doing fine, pain controlled   Objective: Vital signs in last 24 hours: Temp:  [98.5 F (36.9 C)-99.6 F (37.6 C)] 98.8 F (37.1 C) (03/06 0439) Pulse Rate:  [64-93] 64 (03/06 0600) Resp:  [15-22] 22 (03/06 0439) BP: (73-156)/(48-93) 118/61 (03/06 0600) SpO2:  [92 %-95 %] 93 % (03/06 0439) Weight:  [91.2 kg (201 lb 1 oz)] 91.2 kg (201 lb 1 oz) (03/05 2316) Last BM Date: 04/22/17   Laboratory  CBC Recent Labs    04/22/17 1204 04/23/17 0017  WBC 12.7* 11.0*  HGB 10.0* 9.1*  HCT 31.9* 29.6*  PLT 220 208   BMET Recent Labs    04/22/17 1204 04/23/17 0017  NA 136 136  K 4.1 3.8  CL 103 100*  CO2 22 25  GLUCOSE 115* 137*  BUN 26* 29*  CREATININE 1.70* 2.01*  CALCIUM 9.2 8.7*     Physical Exam General appearance: alert and no distress  LLE: DP 2+, sensation parasthetic but unchanged, EHL intact   Assessment/Plan: Fall Left distal periprosthetic femur fx -- ORIF today by Dr. Doreatha Martin about 1600. Please keep NPO. AKI -- Primary team to address Multiple medical problems -- per primary team     Lisette Abu, PA-C Orthopedic Surgery [phone] 04/23/2017

## 2017-04-23 NOTE — Anesthesia Postprocedure Evaluation (Signed)
Anesthesia Post Note  Patient: [name]  Procedure(s) Performed: OPEN REDUCTION INTERNAL FIXATION (ORIF) DISTAL FEMUR FRACTURE (Left )     Patient location during evaluation: PACU Anesthesia Type: General Level of consciousness: awake and alert Pain management: pain level controlled Vital Signs Assessment: post-procedure vital signs reviewed and stable Respiratory status: spontaneous breathing, nonlabored ventilation, respiratory function stable and patient connected to nasal cannula oxygen Cardiovascular status: blood pressure returned to baseline and stable Postop Assessment: no apparent nausea or vomiting Anesthetic complications: no    Last Vitals:  Vitals:   04/23/17 1830 04/23/17 1845  BP: 119/69 118/74  Pulse: 78 66  Resp: 19 15  Temp:  (!) 36.3 C  SpO2: 95% 98%    Last Pain:  Vitals:   04/23/17 1845  TempSrc:   PainSc: 0-No pain    LLE Motor Response: Purposeful movement;Responds to commands (04/23/17 1845) LLE Sensation: Full sensation (04/23/17 1845)          Tyreese Thain

## 2017-04-23 NOTE — Anesthesia Preprocedure Evaluation (Addendum)
Anesthesia Evaluation  Patient identified by MRN, date of birth, ID band Patient awake    Reviewed: Allergy & Precautions, NPO status , Patient's Chart, lab work & pertinent test results  History of Anesthesia Complications Negative for: history of anesthetic complications  Airway Mallampati: II  TM Distance: >3 FB Neck ROM: Full    Dental  (+) Edentulous Upper, Edentulous Lower   Pulmonary shortness of breath, sleep apnea (has CPAP, does not use) , former smoker (quit 2011),    breath sounds clear to auscultation       Cardiovascular hypertension, Pt. on medications (-) angina+ CAD, + Past MI and + Cardiac Stents  + dysrhythmias Atrial Fibrillation  Rhythm:Irregular Rate:Normal  '15 ECHO: EF 50-55%, valves OK   Neuro/Psych  Headaches, Anxiety Depression    GI/Hepatic Neg liver ROS, GERD  Medicated and Controlled,  Endo/Other  negative endocrine ROS  Renal/GU Renal InsufficiencyRenal disease (creat 2.01)     Musculoskeletal  (+) Arthritis , Osteoarthritis,    Abdominal   Peds  Hematology  (+) Blood dyscrasia (eliquis, Hb 9.1), ,   Anesthesia Other Findings   Reproductive/Obstetrics                            Anesthesia Physical Anesthesia Plan  ASA: III  Anesthesia Plan: General   Post-op Pain Management:    Induction: Intravenous  PONV Risk Score and Plan: 3 and Ondansetron, Dexamethasone and Treatment may vary due to age or medical condition  Airway Management Planned: Oral ETT  Additional Equipment:   Intra-op Plan:   Post-operative Plan: Extubation in OR  Informed Consent: I have reviewed the patients History and Physical, chart, labs and discussed the procedure including the risks, benefits and alternatives for the proposed anesthesia with the patient or authorized representative who has indicated his/her understanding and acceptance.   Dental advisory given and Consent  reviewed with POA  Plan Discussed with: CRNA and Surgeon  Anesthesia Plan Comments: (Plan routine monitors, GETA Daughter and patient understand and accept resuscitation, if needed, peri-op  )       Anesthesia Quick Evaluation

## 2017-04-23 NOTE — Progress Notes (Signed)
PROGRESS NOTE  [name]  HAL:[id number] DOB: 1940/09/19 DOA: 04/22/2017 PCP: Unk Pinto, MD  Brief Narrative:  77 y.o. male with history of chronic back pain; neuropathy, HTN, HLD, afib, BPH, and CAD s/p PCI who presented with left hip pain s/p mechanical fall.  He was found to have a left distal periprosthetic femur fracture.  He is undergoing surgical correction today.    Assessment & Plan:  Left periprosthetic femur fracture related to mechanical fall.  -  To OR today for correction -  DVT proph per surgery -  PT eval post operatively  -  Anticipate he will need SNF for acute rehab   Hypertension. Blood pressure remains low normal -Hold Demadex for now -hold Coreg  -  Start IVF   Paroxysmal atrial fibrillation.  Currently rate controlled a-fib.  Mali score 4, needs anticoagulation -holding eliquis for now -Continue beta blocker  Obstructive sleep apnea   Hyperlipidemia/ CAD/history of MI 2 in 1997 and 1998 status post PCA/stent in 2012.  -  Holding eliquis  -  Holding BB but will resume once BP improved -  Continue statin  CKD stage III, unclear baseline creatinine, but probably between 1.4-1.8, currently 2 -  Start IVF -  Minimize nephrotoxins and renally dose medications  Neuropathy, stable, continue gabapentin  GERD, stable, continue PPI   DVT prophylaxis:  SCDS Code Status:  Partial:  Cardioversion, medications, and bipap acceptable, but not chest compressions or intubation Family Communication:  Patient alone Disposition Plan:  Anticipate discharge to SNF in a few days   Consultants:   Orthopedic surgery, Dr. Jacqulynn Cadet  Procedures:  To OR today  Antimicrobials:  Anti-infectives (From admission, onward)   Start     Dose/Rate Route Frequency Ordered Stop   04/23/17 1558  vancomycin (VANCOCIN) powder       As needed 04/23/17 1558     04/23/17 0800  ceFAZolin (ANCEF) IVPB 2g/100 mL premix     2 g 200 mL/hr over 30 Minutes Intravenous  To [name] Stay 04/23/17 0740 04/24/17 0800       Subjective:  Denies lightheadedness, chest pains, shortness of breath worse than baseline.  As long as he remains still he does not have significant pain in his leg.  Objective: Vitals:   04/23/17 0204 04/23/17 0439 04/23/17 0600 04/23/17 1315  BP: 96/66 (!) 91/49 118/61 107/64  Pulse: 64 82 64 72  Resp:  (!) 22  18  Temp:  98.8 F (37.1 C)  98.4 F (36.9 C)  TempSrc:  Axillary  Oral  SpO2:  93%  97%  Weight:      Height:        Intake/Output Summary (Last 24 hours) at 04/23/2017 1626 Last data filed at 04/23/2017 1615 Gross per 24 hour  Intake 600 ml  Output 575 ml  Net 25 ml   Filed Weights   04/22/17 2316  Weight: 91.2 kg (201 lb 1 oz)    Examination:  General exam:  Adult male.  No acute distress.  HEENT:  NCAT, MMM Respiratory system: Clear to auscultation bilaterally Cardiovascular system: IRRR, normal S1/S2. No murmurs, rubs, gallops or clicks.  Warm extremities Gastrointestinal system: Normal active bowel sounds, soft, nondistended, nontender. MSK:  Normal tone and bulk, left leg in traction, warm, < 2 sec CR, able to wiggle toes Neuro:  Grossly intact    Data Reviewed: I have personally reviewed following labs and imaging studies  CBC: Recent Labs  Lab 04/22/17 1204 04/23/17 0017  WBC 12.7* 11.0*  NEUTROABS 9.9*  --   HGB 10.0* 9.1*  HCT 31.9* 29.6*  MCV 85.5 85.8  PLT 220 053   Basic Metabolic Panel: Recent Labs  Lab 04/22/17 1204 04/23/17 0017  NA 136 136  K 4.1 3.8  CL 103 100*  CO2 22 25  GLUCOSE 115* 137*  BUN 26* 29*  CREATININE 1.70* 2.01*  CALCIUM 9.2 8.7*   GFR: Estimated Creatinine Clearance: 33.8 mL/min (A) (by C-G formula based on SCr of 2.01 mg/dL (H)). Liver Function Tests: No results for input(s): AST, ALT, ALKPHOS, BILITOT, PROT, ALBUMIN in the last 168 hours. No results for input(s): LIPASE, AMYLASE in the last 168 hours. No results for input(s): AMMONIA in the last  168 hours. Coagulation Profile: Recent Labs  Lab 04/22/17 1517 04/22/17 2338  INR 1.74 1.72   Cardiac Enzymes: No results for input(s): CKTOTAL, CKMB, CKMBINDEX, TROPONINI in the last 168 hours. BNP (last 3 results) No results for input(s): PROBNP in the last 8760 hours. HbA1C: No results for input(s): HGBA1C in the last 72 hours. CBG: No results for input(s): GLUCAP in the last 168 hours. Lipid Profile: No results for input(s): CHOL, HDL, LDLCALC, TRIG, CHOLHDL, LDLDIRECT in the last 72 hours. Thyroid Function Tests: No results for input(s): TSH, T4TOTAL, FREET4, T3FREE, THYROIDAB in the last 72 hours. Anemia Panel: Recent Labs    04/22/17 1517  VITAMINB12 399   Urine analysis:    Component Value Date/Time   COLORURINE YELLOW 06/28/2016 1011   APPEARANCEUR CLEAR 06/28/2016 1011   LABSPEC 1.007 06/28/2016 1011   PHURINE 6.5 06/28/2016 1011   GLUCOSEU NEGATIVE 06/28/2016 1011   HGBUR NEGATIVE 06/28/2016 1011   HGBUR negative 09/18/2007 0834   BILIRUBINUR NEGATIVE 06/28/2016 1011   BILIRUBINUR neg 04/11/2013 1836   KETONESUR NEGATIVE 06/28/2016 1011   PROTEINUR NEGATIVE 06/28/2016 1011   UROBILINOGEN 0.2 07/12/2013 0802   NITRITE NEGATIVE 06/28/2016 1011   LEUKOCYTESUR NEGATIVE 06/28/2016 1011   Sepsis Labs: @LABRCNTIP (procalcitonin:4,lacticidven:4)  ) Recent Results (from the past 240 hour(s))  Surgical pcr screen     Status: None   Collection Time: 04/22/17 11:29 PM  Result Value Ref Range Status   MRSA, PCR NEGATIVE NEGATIVE Final   Staphylococcus aureus NEGATIVE NEGATIVE Final    Comment: (NOTE) The Xpert SA Assay (FDA approved for NASAL specimens in patients 58 years of age and older), is one component of a comprehensive surveillance program. It is not intended to diagnose infection nor to guide or monitor treatment. Performed at [facility], [place] [address] [place],  [address]       Radiology Studies: Dg Chest 1 View  Result Date:  04/22/2017 CLINICAL DATA:  Preop.  Congestion for 1 day.  Former smoker. EXAM: CHEST 1 VIEW COMPARISON:  01/18/2014 FINDINGS: The cardiac silhouette remains enlarged. Aortic atherosclerosis is noted. There are chronic scratched of there is chronic peribronchial thickening. No confluent airspace opacity, overt pulmonary edema, sizable pleural effusion, or pneumothorax is identified. There are chronic degenerative changes about both shoulders with postoperative changes to the distal right clavicle. IMPRESSION: Chronic bronchitic changes. Electronically Signed   By: Logan Bores M.D.   On: 04/22/2017 16:00   Dg Lumbar Spine Complete  Result Date: 04/22/2017 CLINICAL DATA:  [place] last night, LEFT knee pain. History of lumbar spine surgery. EXAM: LUMBAR SPINE - COMPLETE 4+ VIEW COMPARISON:  Lumbar spine radiographs July 25, 2015 FINDINGS: Stable broad dextroscoliosis. No acute fracture deformity. No malalignment. Severe L1-2 disc height  loss with endplate sclerosis in subsidence. Severe L2-3 and L3-4 similar disc height loss with vacuum disc, endplate sclerosis and marginal spurring. Severe lower lumbar facet arthropathy. No destructive bony lesions. Ankylosis of the sacroiliac joints. Prevertebral and paraspinal soft tissue planes are nonacute. 3.3 cm heavily calcified infrarenal aortic aneurysm. IMPRESSION: No acute fracture deformity or malalignment. Stable marked endplate irregularity and subsidence L1-2 seen with old discitis osteomyelitis and/or severe degenerative disc. Stable overall degenerative change of the lumbar spine. 3.3 cm infrarenal aortic aneurysm for which follow-up abdominal ultrasound or CT angiogram of the abdomen is recommended. Aortic Atherosclerosis (ICD10-I70.0). Electronically Signed   By: Elon Alas M.D.   On: 04/22/2017 13:38   Ct Head Wo Contrast  Result Date: 04/22/2017 CLINICAL DATA:  Patient with neck pain.  Patient status post fall. EXAM: CT HEAD WITHOUT CONTRAST CT CERVICAL  SPINE WITHOUT CONTRAST TECHNIQUE: Multidetector CT imaging of the head and cervical spine was performed following the standard protocol without intravenous contrast. Multiplanar CT image reconstructions of the cervical spine were also generated. COMPARISON:  Brain CT 07/12/2013. FINDINGS: CT HEAD FINDINGS Brain: Ventricles and sulci are appropriate for patient's age. No evidence for acute cortically based infarct, intracranial hemorrhage, mass lesion or mass-effect. Vascular: Internal carotid arterial vascular calcifications. Skull: Intact. Sinuses/Orbits: Paranasal sinuses are well aerated. Mastoid air cells are unremarkable. Orbits are unremarkable. Other: None. CT CERVICAL SPINE FINDINGS Alignment: Grade 1 anterolisthesis of C3 on C4. Skull base and vertebrae: Intact. Soft tissues and spinal canal: No prevertebral fluid or swelling. No visible canal hematoma. Disc levels: Multilevel degenerative disc disease. Fusion at the C3-4 level. No acute fracture. Multilevel facet degenerative changes. Upper chest: Unremarkable. Other: None. IMPRESSION: No acute intracranial process. No acute cervical spine fracture. Multilevel degenerative disc disease. Osseous fusion C3-4. Electronically Signed   By: Lovey Newcomer M.D.   On: 04/22/2017 13:16   Ct Cervical Spine Wo Contrast  Result Date: 04/22/2017 CLINICAL DATA:  Patient with neck pain.  Patient status post fall. EXAM: CT HEAD WITHOUT CONTRAST CT CERVICAL SPINE WITHOUT CONTRAST TECHNIQUE: Multidetector CT imaging of the head and cervical spine was performed following the standard protocol without intravenous contrast. Multiplanar CT image reconstructions of the cervical spine were also generated. COMPARISON:  Brain CT 07/12/2013. FINDINGS: CT HEAD FINDINGS Brain: Ventricles and sulci are appropriate for patient's age. No evidence for acute cortically based infarct, intracranial hemorrhage, mass lesion or mass-effect. Vascular: Internal carotid arterial vascular  calcifications. Skull: Intact. Sinuses/Orbits: Paranasal sinuses are well aerated. Mastoid air cells are unremarkable. Orbits are unremarkable. Other: None. CT CERVICAL SPINE FINDINGS Alignment: Grade 1 anterolisthesis of C3 on C4. Skull base and vertebrae: Intact. Soft tissues and spinal canal: No prevertebral fluid or swelling. No visible canal hematoma. Disc levels: Multilevel degenerative disc disease. Fusion at the C3-4 level. No acute fracture. Multilevel facet degenerative changes. Upper chest: Unremarkable. Other: None. IMPRESSION: No acute intracranial process. No acute cervical spine fracture. Multilevel degenerative disc disease. Osseous fusion C3-4. Electronically Signed   By: Lovey Newcomer M.D.   On: 04/22/2017 13:16   Ct Hip Left Wo Contrast  Result Date: 04/22/2017 CLINICAL DATA:  Recent fall with left hip pain. Questionable left femur fracture radiographs. EXAM: CT OF THE LEFT HIP WITHOUT CONTRAST TECHNIQUE: Multidetector CT imaging of the left hip was performed according to the standard protocol. Multiplanar CT image reconstructions were also generated. COMPARISON:  Left hip radiographs 04/22/2017 and 06/26/2015. FINDINGS: Bones/Joint/Cartilage The mineralization is adequate. There is no evidence of acute fracture,  dislocation or left femoral head avascular necrosis. Mild left hip degenerative changes are present without significant joint effusion. Mild degenerative changes are also present at the left sacroiliac joint and L5-S1 disc space. Ligaments Suboptimally assessed by CT. Muscles and Tendons There is a least partial tearing of the left common hamstring tendon with partial tendon retraction into the posterior thigh. There is associated proximal left hamstring muscular fatty atrophy. There is some fatty atrophy within the left rectus femoris muscle as well. There is ossification along the insertion of the iliopsoas tendon. Soft tissues Iliac and femoral atherosclerosis. No evidence of  periarticular hematoma. IMPRESSION: 1. No evidence of acute left hip fracture or dislocation. 2. No definite acute posttraumatic findings. 3. Partial tearing of the left common hamstring tendon with associated muscular atrophy. Electronically Signed   By: Richardean Sale M.D.   On: 04/22/2017 15:27   Dg Knee Complete 4 Views Left  Result Date: 04/22/2017 CLINICAL DATA:  Recent fall with left knee pain, initial encounter EXAM: LEFT KNEE - COMPLETE 4+ VIEW COMPARISON:  None. FINDINGS: There is a comminuted fracture identified in the distal left femoral metaphysis with lateral displacement of the distal fracture fragments as well as the femoral portion of the knee prosthesis. Some impaction is also noted at the fracture site. The proximal tibia and fibula appear within normal limits with the tibial prosthesis is intact. IMPRESSION: Comminuted distal metaphyseal fracture of the left femur as described. Electronically Signed   By: Inez Catalina M.D.   On: 04/22/2017 13:42   Dg Hip Unilat W Or Wo Pelvis 2-3 Views Left  Result Date: 04/22/2017 CLINICAL DATA:  Recent falls with left hip pain, initial encounter EXAM: DG HIP (WITH OR WITHOUT PELVIS) 2-3V LEFT COMPARISON:  06/26/2015 FINDINGS: Pelvic ring is well visualized and intact. No dislocation is seen. Degenerative changes of lumbar spine and hip joints are noted bilaterally. There is a vertical lucency through the intratrochanteric region seen only on the oblique image. The possibility of an undisplaced fracture could not be totally excluded. If clinical suspicion is high a CT of the pelvis may be helpful for further evaluation. IMPRESSION: Degenerative changes of lumbar spine and hip joints. Vertical lucency through the proximal femur as described only on the oblique image. If clinically indicated CT of the pelvis may be helpful for further evaluation. Electronically Signed   By: Inez Catalina M.D.   On: 04/22/2017 13:41     Scheduled Meds: . [MAR Hold]  atorvastatin  10 mg Oral Daily  . [MAR Hold] escitalopram  20 mg Oral Daily  . [MAR Hold] gabapentin  300 mg Oral TID  . [MAR Hold] HYDROcodone-acetaminophen  1 tablet Oral 5 X Daily  . [MAR Hold] multivitamin with minerals  1 tablet Oral Daily  . [MAR Hold] mupirocin ointment  1 application Topical Daily  . [MAR Hold] OLANZapine  7.5 mg Oral Daily  . [MAR Hold] pantoprazole  40 mg Oral Daily   Continuous Infusions: . sodium chloride 10 mL/hr at 04/23/17 1427  .  ceFAZolin (ANCEF) IV    . dextrose 5 % and 0.9% NaCl 75 mL/hr at 04/23/17 0959  . [MAR Hold] methocarbamol (ROBAXIN)  IV       LOS: 1 day    Time spent: 30 min    Janece Canterbury, MD [facility] Pager [phone]  If 7PM-7AM, please contact night-coverage [email or web address] Password [facility] 04/23/2017, 4:26 PM

## 2017-04-23 NOTE — Interval H&P Note (Signed)
History and Physical Interval Note:  04/23/2017 2:42 PM  [name]  has presented today for surgery, with the diagnosis of LEFT DISTAL FEMUR FRACTURE  The various methods of treatment have been discussed with the patient and family. After consideration of risks, benefits and other options for treatment, the patient has consented to  Procedure(s): OPEN REDUCTION INTERNAL FIXATION (ORIF) DISTAL FEMUR FRACTURE (Left) as a surgical intervention .  The patient's history has been reviewed, patient examined, no change in status, stable for surgery.  I have reviewed the patient's chart and labs.  Questions were answered to the patient's satisfaction.     Lennette Bihari P Juanangel Soderholm

## 2017-04-23 NOTE — Progress Notes (Signed)
Initial Nutrition Assessment  DOCUMENTATION CODES:   Not applicable  INTERVENTION:   -MVI daily  NUTRITION DIAGNOSIS:   Increased nutrient needs related to post-op healing as evidenced by estimated needs.  GOAL:   Patient will meet greater than or equal to 90% of their needs  MONITOR:   Diet advancement, Labs, Weight trends, Skin, I & O's  REASON FOR ASSESSMENT:   Consult Hip fracture protocol  ASSESSMENT:   [name] is a 77 y.o. male with a Past Medical History of chronic back pain; neuropathy (limited rehab potential, according to family); HTN; HLD; afib; BPH; and CAD s/p PCI who presents with left hip pain s/p mechanical fall  Pt admitted with lt femur fx s/p fall.   Case discussed with RN, who confirms plan for ORIF later today.   Pt very drowsy at time of visit. Pt daughter at bedside provided hx. She reports pt with fair appetite at home. He generally consumes 3 small meals and 2-3 snacks daily. Typical intake consists of Breakfast: cereal or oatmeal with eggs, Lunch: sandwich: Dinner: meat, starch, and vegetable. Pt consumes mainly fruit for snacks. Per pt daughter, pt has lost weight over the past several years, due to lifestyle modifications, however, noted wt to be stable over the past year. UBW around 190-200#.   Pt with mobility difficulty at baseline; pt with in-home caregivers to assist. Daughter has noticed atrophy in upper and lower extremities.   Discussed with pt daughter importance of good meal completion to assist with healing. Daughter with no nutritional concerns at this time, but very appreciative of visit.   Labs reviewed.   NUTRITION - FOCUSED PHYSICAL EXAM:    Most Recent Value  Orbital Region  No depletion  Upper Arm Region  Mild depletion  Thoracic and Lumbar Region  No depletion  Buccal Region  No depletion  Temple Region  No depletion  Clavicle Bone Region  No depletion  Clavicle and Acromion Bone Region  No depletion  Scapular  Bone Region  No depletion  Dorsal Hand  No depletion  Patellar Region  Mild depletion  Anterior Thigh Region  Mild depletion  Posterior Calf Region  Mild depletion  Edema (RD Assessment)  Mild  Hair  Reviewed  Eyes  Reviewed  Mouth  Reviewed  Skin  Reviewed  Nails  Reviewed       Diet Order:  Diet NPO time specified  EDUCATION NEEDS:   Education needs have been addressed  Skin:  Skin Assessment: Reviewed RN Assessment  Last BM:  04/22/17  Height:   Ht Readings from Last 1 Encounters:  04/22/17 5\' 9"  (1.753 m)    Weight:   Wt Readings from Last 1 Encounters:  04/22/17 201 lb 1 oz (91.2 kg)    Ideal Body Weight:  72.7 kg  BMI:  Body mass index is 29.69 kg/m.  Estimated Nutritional Needs:   Kcal:  1800-2000  Protein:  90-105 grams  Fluid:  1.8-2.0 L   [name] [name], RD, LDN, CDE Pager: [phone] After hours Pager: [phone]

## 2017-04-23 NOTE — Care Management Note (Signed)
Case Management Note  Patient Details  Name: [name] MRN: [id number] Date of Birth: 07/20/1940  Subjective/Objective:                    Action/Plan:  Await post op PT evaluation  Expected Discharge Date:                  Expected Discharge Plan:     In-House Referral:     Discharge planning Services  CM Consult  Post Acute Care Choice:  Home Health, Durable Medical Equipment Choice offered to:     DME Arranged:    DME Agency:     HH Arranged:    [place] Agency:     Status of Service:  In process, will continue to follow  If discussed at Long Length of Stay Meetings, dates discussed:    Additional Comments:  Marilu Favre, RN 04/23/2017, 12:23 PM

## 2017-04-23 NOTE — Op Note (Signed)
OrthopaedicSurgeryOperativeNote (TTS:[id number]) Date of Surgery: 04/23/2017  Admit Date: 04/22/2017   Diagnoses: Pre-Op Diagnoses: Left periprosthetic femur fracture  Post-Op Diagnosis: Same  Procedures: CPT 27511  Surgeons: Primary: Shona Needles, MD   Location:[facility] OR ROOM 07   AnesthesiaGeneral   Antibiotics:Ancef 2g preop  Tourniquettime:* No tourniquets in log * .  [id number]:625 mL   Complications:None  Specimens:None  Implants: Implant Name Type Inv. Item Serial No. Manufacturer Lot No. LRB No. Used Action  PLATE DIST FEM 63S - [id number] Plate PLATE DIST FEM 81L  [employer](ORTH,TRAU,BIO,SG)  Left 1 Implanted  SCREW 5.0 80MM - [id number] Screw SCREW 5.0 80MM  [employer](ORTH,TRAU,BIO,SG)  Left 2 Implanted  SCREW NCB 5.0X85MM - [id number] Screw SCREW NCB 5.0X85MM  [employer](ORTH,TRAU,BIO,SG)  Left 4 Implanted  SCREW CORT NCB SELFTAP 5.0X42 - [id number] Screw SCREW CORT NCB SELFTAP 5.0X42  [employer](ORTH,TRAU,BIO,SG)  Left 2 Implanted  SCREW CORTICAL NCB 5.0X40 - [id number] Screw SCREW CORTICAL NCB 5.0X40  [employer](ORTH,TRAU,BIO,SG)  Left 1 Implanted  SCREW NCB 4.0X40MM - [id number] Screw SCREW NCB 4.0X40MM  [employer](ORTH,TRAU,BIO,SG)  Left 1 Implanted  SCREW NCB 4.0MX42M - [id number] Screw SCREW NCB 4.0MX42M  [employer](ORTH,TRAU,BIO,SG)  Left 1 Implanted  CAP LOCK NCB - [id number] Cap CAP LOCK NCB  [employer](ORTH,TRAU,BIO,SG)  Left 9 Implanted    IndicationsforSurgery: 77 year old male who had a ground-level fall.  He sustained a left displaced periprosthetic distal femur fracture.  I felt that proceeding with ORIF was the most appropriate course of action.  Risks and benefits were discussed with the patient's family and him. Risks discussed included bleeding requiring blood transfusion, bleeding causing a hematoma, infection, malunion, nonunion, damage to surrounding nerves and blood vessels, pain, hardware prominence or  irritation, hardware failure, stiffness, post-traumatic arthritis, DVT/PE, compartment syndrome, and even death.  The family agreed to proceed and consent was obtained.  Operative Findings: Successful open reduction internal fixation of left periprosthetic distal femur fracture using [employer] 12 hole NCB periprosthetic plate.  Procedure: The patient was identified in the preoperative holding area. Consent was confirmed with the patient and all questions were answered. The operative extremity was marked after confirmation with the patient. The patientwas then brought back to the operating room by our anesthesia colleagues. The patient was placed under general anesthesia and was carefully transferred over to a radiolucent flattop table. They weresecured to the bed and all bony prominences were padded. A bump was placed under the operativehip. The operative extremity was then prepped and draped in usual sterile fashion. A timeout was performed to verify the patient, the procedure and extremity. Preoperative antibiotics were dosed.  Fluoroscopy was used to identify the fracture. A lateral approach was made to the distal femur. It was taken down through the skin and the IT band was incised in line with the incision. The distal portion of the vastus lateralis was reflected off the IT band and the distal articular block of the lateral femoral condyle was exposed. Subperiosteal dissection was carried to the edges of the articular surface of theknee prosthesis.  A reduction maneuver was performed to align the fracture in the AP and lateral planes.   I used fluoroscopy to identify the correct length plate to bridge the fracture with a significant working length. I chose a 12-hole plate. The aiming arm was attached to the plate and slide submuscularly under the vastus to the proximal femur. A K-wire was used to appropriately align the distal portion of the plate. A 3.101mm drill bit was  used to  provisionally fix the proximal portion of the plate with a unicortical K-wire. A perfect lateral was obtained to show appropriate positioning of the plate.  The distal segment was drilled and 5.90mm cortical screws were placed in the distal fracture segment. A total of 3 screws were placed. Bicortical 5.52mm cortical screws were placed in the femoral shaft to correct the residual plate translation. The most proximal screw was a 4.0 millimeter screw that was left unlocked to prevent a stress riser. These were all placed through the targeting guide with percutaneous incisions. Once all of the screws were in place in the femoral shaft,locking caps were placed over to the 2 distal screws in the shaft. The targeting guide was removed. Three more screws were placed in the distal segment. Fluoroscopy was used to confirm adequate reduction of the fracture and appropriate position of the plate and screws.  Locking caps were placed on all distal screws in the articular block.   The incisions were irrigated with normal saline. A gram of vancomycin powder was placed in the incision around the plate. The IT band was closed with 0-vicryl. The skin was closed with 2-vicryl, 3-0 nylon. The incisions were dressed with bacitracin ointment, Adaptic, 4 x 4's and sterile cast padding.. The patient was then transferred to the regular floor bed and taken to the PACU in stable condition.  Post Op Plan/Instructions: The patient will be weightbearing as tolerated to left lower extremity.  He received postoperative Ancef for surgical prophylaxis.  He may be restarted on his Eliquis on postoperative day 1.  He will mobilize with physical and occupational therapy.  I was present and performed the entire surgery.  Katha Hamming, MD [facility]

## 2017-04-24 LAB — BASIC METABOLIC PANEL
ANION GAP: 7 (ref 5–15)
BUN: 25 mg/dL — ABNORMAL HIGH (ref 6–20)
CHLORIDE: 106 mmol/L (ref 101–111)
CO2: 23 mmol/L (ref 22–32)
Calcium: 7.8 mg/dL — ABNORMAL LOW (ref 8.9–10.3)
Creatinine, Ser: 1.54 mg/dL — ABNORMAL HIGH (ref 0.61–1.24)
GFR calc non Af Amer: 41 mL/min — ABNORMAL LOW (ref >60)
GFR, EST AFRICAN AMERICAN: 48 mL/min — AB (ref >60)
Glucose, Bld: 152 mg/dL — ABNORMAL HIGH (ref 65–99)
POTASSIUM: 4.4 mmol/L (ref 3.5–5.1)
SODIUM: 136 mmol/L (ref 135–145)

## 2017-04-24 LAB — CBC
HEMATOCRIT: 26.4 % — AB (ref 39.0–52.0)
HEMOGLOBIN: 8 g/dL — AB (ref 13.0–17.0)
MCH: 26.4 pg (ref 26.0–34.0)
MCHC: 30.3 g/dL (ref 30.0–36.0)
MCV: 87.1 fL (ref 78.0–100.0)
Platelets: 188 10*3/uL (ref 150–400)
RBC: 3.03 MIL/uL — ABNORMAL LOW (ref 4.22–5.81)
RDW: 18 % — ABNORMAL HIGH (ref 11.5–15.5)
WBC: 10.5 10*3/uL (ref 4.0–10.5)

## 2017-04-24 MED ORDER — SENNA 8.6 MG PO TABS
2.0000 | ORAL_TABLET | Freq: Every day | ORAL | Status: DC
  Administered 2017-04-24 – 2017-04-25 (×2): 17.2 mg via ORAL
  Filled 2017-04-24 (×2): qty 2

## 2017-04-24 MED ORDER — DOCUSATE SODIUM 100 MG PO CAPS
100.0000 mg | ORAL_CAPSULE | Freq: Two times a day (BID) | ORAL | Status: DC
  Administered 2017-04-24 – 2017-04-26 (×4): 100 mg via ORAL
  Filled 2017-04-24 (×4): qty 1

## 2017-04-24 MED ORDER — APIXABAN 5 MG PO TABS
5.0000 mg | ORAL_TABLET | Freq: Two times a day (BID) | ORAL | Status: DC
  Administered 2017-04-24 – 2017-04-26 (×4): 5 mg via ORAL
  Filled 2017-04-24 (×4): qty 1

## 2017-04-24 MED ORDER — POLYETHYLENE GLYCOL 3350 17 G PO PACK
17.0000 g | PACK | Freq: Every day | ORAL | Status: DC
  Administered 2017-04-24 – 2017-04-26 (×3): 17 g via ORAL
  Filled 2017-04-24 (×3): qty 1

## 2017-04-24 MED ORDER — CARVEDILOL 6.25 MG PO TABS
6.2500 mg | ORAL_TABLET | Freq: Two times a day (BID) | ORAL | Status: DC
  Administered 2017-04-24 – 2017-04-26 (×4): 6.25 mg via ORAL
  Filled 2017-04-24 (×4): qty 1

## 2017-04-24 NOTE — Discharge Instructions (Addendum)
Information on my medicine - ELIQUIS (apixaban)  Why was Eliquis prescribed for you? Eliquis was prescribed for you to reduce the risk of a blood clot forming that can cause a stroke if you have a medical condition called atrial fibrillation (a type of irregular heartbeat).  What do You need to know about Eliquis ? Take your Eliquis TWICE DAILY - one tablet in the morning and one tablet in the evening with or without food. If you have difficulty swallowing the tablet whole please discuss with your pharmacist how to take the medication safely.  Take Eliquis exactly as prescribed by your doctor and DO NOT stop taking Eliquis without talking to the doctor who prescribed the medication.  Stopping may increase your risk of developing a stroke.  Refill your prescription before you run out.  After discharge, you should have regular check-up appointments with your healthcare provider that is prescribing your Eliquis.  In the future your dose may need to be changed if your kidney function or weight changes by a significant amount or as you get older.  What do you do if you miss a dose? If you miss a dose, take it as soon as you remember on the same day and resume taking twice daily.  Do not take more than one dose of ELIQUIS at the same time to make up a missed dose.  Important Safety Information A possible side effect of Eliquis is bleeding. You should call your healthcare provider right away if you experience any of the following: ? Bleeding from an injury or your nose that does not stop. ? Unusual colored urine (red or dark brown) or unusual colored stools (red or black). ? Unusual bruising for unknown reasons. ? A serious fall or if you hit your head (even if there is no bleeding).  Some medicines may interact with Eliquis and might increase your risk of bleeding or clotting while on Eliquis. To help avoid this, consult your healthcare provider or pharmacist prior to using any new  prescription or non-prescription medications, including herbals, vitamins, non-steroidal anti-inflammatory drugs (NSAIDs) and supplements.  This website has more information on Eliquis (apixaban): [email or web address].com/[email or web address]  [facility] Discharge Instructions   General Discharge Instructions  WEIGHT BEARING STATUS: Weight bearing as tolerated  RANGE OF MOTION/ACTIVITY:No restrictions  Wound Care: Keep wound covered if there is any drainage, if no drainage may leave open to air  DVT/PE prophylaxis: Eliquis  Diet: as you were eating previously.  Can use over the counter stool softeners and bowel preparations, such as Miralax, to help with bowel movements.  Narcotics can be constipating.  Be sure to drink plenty of fluids  PAIN MEDICATION USE AND EXPECTATIONS  You have likely been given narcotic medications to help control your pain.  After a traumatic event that results in an fracture (broken bone) with or without surgery, it is ok to use narcotic pain medications to help control one's pain.  We understand that everyone responds to pain differently and each individual patient will be evaluated on a regular basis for the continued need for narcotic medications. Ideally, narcotic medication use should last no more than 6-8 weeks (coinciding with fracture healing).   As a patient it is your responsibility as well to monitor narcotic medication use and report the amount and frequency you use these medications when you come to your office visit.   We would also advise that if you are using narcotic medications, you should take a dose prior to therapy to  maximize you participation.  IF YOU ARE ON NARCOTIC MEDICATIONS IT IS NOT PERMISSIBLE TO OPERATE A MOTOR VEHICLE (MOTORCYCLE/CAR/TRUCK/MOPED) OR HEAVY MACHINERY DO NOT MIX NARCOTICS WITH OTHER CNS (CENTRAL NERVOUS SYSTEM) DEPRESSANTS SUCH AS ALCOHOL   STOP SMOKING OR USING NICOTINE PRODUCTS!!!!  As discussed nicotine  severely impairs your body's ability to heal surgical and traumatic wounds but also impairs bone healing.  Wounds and bone heal by forming microscopic blood vessels (angiogenesis) and nicotine is a vasoconstrictor (essentially, shrinks blood vessels).  Therefore, if vasoconstriction occurs to these microscopic blood vessels they essentially disappear and are unable to deliver necessary nutrients to the healing tissue.  This is one modifiable factor that you can do to dramatically increase your chances of healing your injury.    (This means no smoking, no nicotine gum, patches, etc)  DO NOT USE NONSTEROIDAL ANTI-INFLAMMATORY DRUGS (NSAID'S)  Using products such as Advil (ibuprofen), Aleve (naproxen), Motrin (ibuprofen) for additional pain control during fracture healing can delay and/or prevent the healing response.  If you would like to take over the counter (OTC) medication, Tylenol (acetaminophen) is ok.  However, some narcotic medications that are given for pain control contain acetaminophen as well. Therefore, you should not exceed more than 4000 mg of tylenol in a day if you do not have liver disease.  Also note that there are may OTC medicines, such as cold medicines and allergy medicines that my contain tylenol as well.  If you have any questions about medications and/or interactions please ask your doctor/PA or your pharmacist.      ICE AND ELEVATE INJURED/OPERATIVE EXTREMITY  Using ice and elevating the injured extremity above your heart can help with swelling and pain control.  Icing in a pulsatile fashion, such as 20 minutes on and 20 minutes off, can be followed.    Do not place ice directly on skin. Make sure there is a barrier between to skin and the ice pack.    Using frozen items such as frozen peas works well as the conform nicely to the are that needs to be iced.  USE AN ACE WRAP OR TED HOSE FOR SWELLING CONTROL  In addition to icing and elevation, Ace wraps or TED hose are used to  help limit and resolve swelling.  It is recommended to use Ace wraps or TED hose until you are informed to stop.    When using Ace Wraps start the wrapping distally (farthest away from the body) and wrap proximally (closer to the body)   Example: If you had surgery on your leg or thing and you do not have a splint on, start the ace wrap at the toes and work your way up to the thigh        If you had surgery on your upper extremity and do not have a splint on, start the ace wrap at your fingers and work your way up to the upper arm   [place]: [phone]

## 2017-04-24 NOTE — Progress Notes (Signed)
Orthopaedic Trauma Progress Note  S: Doing well, pain controlled  O:  Vitals:   04/24/17 0248 04/24/17 0531  BP: 109/69 116/69  Pulse: 97 81  Resp: 16 16  Temp: 97.7 F (36.5 C) (!) 97.5 F (36.4 C)  SpO2: 92% 93%  Gen: NAD LLE: Dressing in place, compartments soft and compressible. Neuro exam is at baseline  Imaging: Stable postop films  Labs:  CBC    Component Value Date/Time   WBC 10.5 04/24/2017 0439   RBC 3.03 (L) 04/24/2017 0439   HGB 8.0 (L) 04/24/2017 0439   HCT 26.4 (L) 04/24/2017 0439   PLT 188 04/24/2017 0439   MCV 87.1 04/24/2017 0439   MCV 95.6 04/11/2013 1836   MCH 26.4 04/24/2017 0439   MCHC 30.3 04/24/2017 0439   RDW 18.0 (H) 04/24/2017 0439   RDW 17.4 (H) 03/16/2014 1101   LYMPHSABS 1.9 04/22/2017 1204   LYMPHSABS 2.9 03/16/2014 1101   MONOABS 0.8 04/22/2017 1204   EOSABS 0.1 04/22/2017 1204   EOSABS 0.6 (H) 03/16/2014 1101   BASOSABS 0.0 04/22/2017 1204   BASOSABS 0.1 03/16/2014 11095    A/P: 77 year old male with left periprosthetic femur fracture  -WBAT LLE -PT/OT -Pain control -Okay to restart eliquis from orthopaedic standpoint this evening  Shona Needles, MD [facility] [phone] (phone)

## 2017-04-24 NOTE — Progress Notes (Addendum)
[place] NOTE  DENG [name]  WPY:[id number] DOB: 05-09-1940 DOA: 04/22/2017 PCP: Unk Pinto, MD  Brief Narrative:  77 y.o. male with history of chronic back pain; neuropathy, HTN, HLD, afib, BPH, and CAD s/p PCI who presented with left hip pain s/p mechanical fall.  He was found to have a left distal periprosthetic femur fracture.  Underwent ORIF of his left periprosthetic distal femur fracture on 3/6 by Dr. Doreatha Martin.  Assessment & Plan:  Left periprosthetic femur fracture related to mechanical fall status post ORIF on 3/6 by Dr. Doreatha Martin -  Resume Eliquis for DVT proph -  PT commending skilled nursing facility, social work aware -DC Foley   Hypertension. Blood pressure improved -Hold Demadex for now -resume Coreg  -DC IVF   Paroxysmal atrial fibrillation.  Currently rate controlled a-fib.  Mali score 4, needs anticoagulation -Resume Eliquis -resume beta blocker  Obstructive sleep apnea   Hyperlipidemia/ CAD/history of MI 2 in 1997 and 1998 status post PCA/stent in 2012.  -  Resume eliquis and BB -  Continue statin  CKD stage III, unclear baseline creatinine, but probably between 1.4-1.8, trended down with IVF -  D/c IVF -  Minimize nephrotoxins and renally dose medications  Neuropathy, stable, continue gabapentin  GERD, stable, continue PPI  Postoperative anemia superimposed on anemia of renal disease -No need for blood transfusion today -Repeat CBC in a.m. -Consider iron supplementation at discharge  Constipation Start Colace, senna and schedule MiraLAX-   DVT prophylaxis: Apixaban Code Status:  Partial:  Cardioversion, medications, and bipap acceptable, but not chest compressions or intubation Family Communication:  Patient alone Disposition Plan:  Anticipate discharge to SNF in 1-2 days barring complications   Consultants:   Orthopedic surgery, Dr. Jacqulynn Cadet  Procedures:  ORIF on 3/6  Antimicrobials:  Anti-infectives (From admission, onward)     Start     Dose/Rate Route Frequency Ordered Stop   04/23/17 2200  ceFAZolin (ANCEF) IVPB 2g/100 mL premix     2 g 200 mL/hr over 30 Minutes Intravenous Every 8 hours 04/23/17 1919 04/24/17 1431   04/23/17 1558  vancomycin (VANCOCIN) powder  Status:  Discontinued       As needed 04/23/17 1558 04/23/17 1744   04/23/17 0800  ceFAZolin (ANCEF) IVPB 2g/100 mL premix  Status:  Discontinued     2 g 200 mL/hr over 30 Minutes Intravenous To Mayra Brahm Stay 04/23/17 0740 04/23/17 1904       Subjective:  Denies lightheadedness, chest pains, shortness of breath.  States that his leg feels much better since surgery.  Objective: Vitals:   04/24/17 0248 04/24/17 0531 04/24/17 1028 04/24/17 1254  BP: 109/69 116/69 (!) 121/56 (!) 101/56  Pulse: 97 81 87 87  Resp: 16 16 18 18   Temp: 97.7 F (36.5 C) (!) 97.5 F (36.4 C) (!) 97.5 F (36.4 C) 98.1 F (36.7 C)  TempSrc: Oral Oral Oral Oral  SpO2: 92% 93% 94% 93%  Weight:      Height:        Intake/Output Summary (Last 24 hours) at 04/24/2017 1744 Last data filed at 04/24/2017 1425 Gross per 24 hour  Intake 1840 ml  Output 950 ml  Net 890 ml   Filed Weights   04/22/17 2316  Weight: 91.2 kg (201 lb 1 oz)    Examination:  General exam:  Adult male.  No acute distress.  HEENT:  NCAT, MMM Respiratory system: Clear to auscultation bilaterally Cardiovascular system: IRRR, normal S1/S2. No murmurs, rubs, gallops or clicks.  Warm extremities Gastrointestinal system: Normal active bowel sounds, soft, nondistended, nontender. MSK:  Normal tone and bulk, left leg wrapped from thigh to foot.  Able to wiggle toes, < 2 sec CR.   Neuro:  Grossly intact    Data Reviewed: I have personally reviewed following labs and imaging studies  CBC: Recent Labs  Lab 04/22/17 1204 04/23/17 0017 04/24/17 0439  WBC 12.7* 11.0* 10.5  NEUTROABS 9.9*  --   --   HGB 10.0* 9.1* 8.0*  HCT 31.9* 29.6* 26.4*  MCV 85.5 85.8 87.1  PLT 220 208 671   Basic  Metabolic Panel: Recent Labs  Lab 04/22/17 1204 04/23/17 0017 04/24/17 0439  NA 136 136 136  K 4.1 3.8 4.4  CL 103 100* 106  CO2 22 25 23   GLUCOSE 115* 137* 152*  BUN 26* 29* 25*  CREATININE 1.70* 2.01* 1.54*  CALCIUM 9.2 8.7* 7.8*   GFR: Estimated Creatinine Clearance: 44.1 mL/min (A) (by C-G formula based on SCr of 1.54 mg/dL (H)). Liver Function Tests: No results for input(s): AST, ALT, ALKPHOS, BILITOT, PROT, ALBUMIN in the last 168 hours. No results for input(s): LIPASE, AMYLASE in the last 168 hours. No results for input(s): AMMONIA in the last 168 hours. Coagulation Profile: Recent Labs  Lab 04/22/17 1517 04/22/17 2338  INR 1.74 1.72   Cardiac Enzymes: No results for input(s): CKTOTAL, CKMB, CKMBINDEX, TROPONINI in the last 168 hours. BNP (last 3 results) No results for input(s): PROBNP in the last 8760 hours. HbA1C: No results for input(s): HGBA1C in the last 72 hours. CBG: No results for input(s): GLUCAP in the last 168 hours. Lipid Profile: No results for input(s): CHOL, HDL, LDLCALC, TRIG, CHOLHDL, LDLDIRECT in the last 72 hours. Thyroid Function Tests: No results for input(s): TSH, T4TOTAL, FREET4, T3FREE, THYROIDAB in the last 72 hours. Anemia Panel: Recent Labs    04/22/17 1517  VITAMINB12 399   Urine analysis:    Component Value Date/Time   COLORURINE YELLOW 06/28/2016 1011   APPEARANCEUR CLEAR 06/28/2016 1011   LABSPEC 1.007 06/28/2016 1011   PHURINE 6.5 06/28/2016 1011   GLUCOSEU NEGATIVE 06/28/2016 [place] 06/28/2016 1011   HGBUR negative 09/18/2007 0834   BILIRUBINUR NEGATIVE 06/28/2016 1011   BILIRUBINUR neg 04/11/2013 1836   KETONESUR NEGATIVE 06/28/2016 1011   PROTEINUR NEGATIVE 06/28/2016 1011   UROBILINOGEN 0.2 07/12/2013 0802   NITRITE NEGATIVE 06/28/2016 1011   LEUKOCYTESUR NEGATIVE 06/28/2016 1011   Sepsis Labs: @LABRCNTIP (procalcitonin:4,lacticidven:4)  ) Recent Results (from the past 240 hour(s))  Surgical  pcr screen     Status: None   Collection Time: 04/22/17 11:29 PM  Result Value Ref Range Status   MRSA, PCR NEGATIVE NEGATIVE Final   Staphylococcus aureus NEGATIVE NEGATIVE Final    Comment: (NOTE) The Xpert SA Assay (FDA approved for NASAL specimens in patients 53 years of age and older), is one component of a comprehensive surveillance program. It is not intended to diagnose infection nor to guide or monitor treatment. Performed at [facility], [place] [address] [place], [place] [address]       Radiology Studies: Dg C-arm 1-60 Min  Result Date: 04/23/2017 CLINICAL DATA:  ORIF of distal femoral fracture EXAM: DG C-ARM 61-120 MIN; LEFT FEMUR 2 VIEWS COMPARISON:  04/22/2017 FINDINGS: 1 minute 37 seconds of fluoroscopic time was utilized during fixation of an acute, valgus angulated supracondylar fracture of the left femur. Subsequent placement of lateral femoral plate and screw fixation hardware is noted with  improved alignment demonstrated. Pre-existing total knee arthroplasty is maintained. IMPRESSION: New lateral plate and screw fixation of a supracondylar fracture of the left femur with improved alignment. Electronically Signed   By: Ashley Royalty M.D.   On: 04/23/2017 18:04   Dg C-arm 1-60 Min  Result Date: 04/23/2017 CLINICAL DATA:  ORIF of distal femoral fracture EXAM: DG C-ARM 61-120 MIN; LEFT FEMUR 2 VIEWS COMPARISON:  04/22/2017 FINDINGS: 1 minute 37 seconds of fluoroscopic time was utilized during fixation of an acute, valgus angulated supracondylar fracture of the left femur. Subsequent placement of lateral femoral plate and screw fixation hardware is noted with improved alignment demonstrated. Pre-existing total knee arthroplasty is maintained. IMPRESSION: New lateral plate and screw fixation of a supracondylar fracture of the left femur with improved alignment. Electronically Signed   By: Ashley Royalty M.D.   On: 04/23/2017 18:04   Dg Femur Min 2 Views Left  Result Date:  04/23/2017 CLINICAL DATA:  ORIF of distal femoral fracture EXAM: DG C-ARM 61-120 MIN; LEFT FEMUR 2 VIEWS COMPARISON:  04/22/2017 FINDINGS: 1 minute 37 seconds of fluoroscopic time was utilized during fixation of an acute, valgus angulated supracondylar fracture of the left femur. Subsequent placement of lateral femoral plate and screw fixation hardware is noted with improved alignment demonstrated. Pre-existing total knee arthroplasty is maintained. IMPRESSION: New lateral plate and screw fixation of a supracondylar fracture of the left femur with improved alignment. Electronically Signed   By: Ashley Royalty M.D.   On: 04/23/2017 18:04   Dg Femur Port Min 2 Views Left  Result Date: 04/23/2017 CLINICAL DATA:  History of recent ORIF of distal left femoral fracture. EXAM: LEFT FEMUR PORTABLE 2 VIEWS COMPARISON:  Intraoperative films from earlier in the same day. FINDINGS: Changes consistent with the recent distal left femoral fracture are noted. Lateral fixation sideplate with multiple fixation screws is seen. The fracture fragments are in near anatomic alignment. No other focal abnormality is noted. IMPRESSION: Status post ORIF of distal left femoral fracture. Electronically Signed   By: Inez Catalina M.D.   On: 04/23/2017 20:20     Scheduled Meds: . atorvastatin  10 mg Oral Daily  . escitalopram  20 mg Oral Daily  . gabapentin  300 mg Oral TID  . HYDROcodone-acetaminophen  1 tablet Oral 5 X Daily  . multivitamin with minerals  1 tablet Oral Daily  . mupirocin ointment  1 application Topical Daily  . OLANZapine  7.5 mg Oral Daily  . pantoprazole  40 mg Oral Daily   Continuous Infusions: . sodium chloride 10 mL/hr at 04/23/17 1427  . dextrose 5 % and 0.9% NaCl 75 mL/hr at 04/23/17 0959  . methocarbamol (ROBAXIN)  IV       LOS: 2 days    Time spent: 30 min    Janece Canterbury, MD [facility] Pager [phone]  If 7PM-7AM, please contact night-coverage [email or web address] Password  [facility] 04/24/2017, 5:44 PM

## 2017-04-24 NOTE — Evaluation (Signed)
Occupational Therapy Evaluation Patient Details Name: [name] MRN: [id number] DOB: May 30, 1940 Today's Date: 04/24/2017    History of Present Illness Pt is 77 y.o. male who recently underwent ORIF for a L distal femur fracture following a fall. Pt. has medical history of chronic back pain, HTN, CAD, atrial fibrillation, arthritis in both knees, anxiety, neuropathy, MI, and depression. PMHx also includes two lumbar surgeries, R TKA, R TKR, as well as a joint replacement in L knee.   Clinical Impression   Pt reports he was independent with ADL PTA; unsure of accuracy of information provided by pt. Currently pt requires max assist +2 for sit to stand and min assist +2 for short distance functional mobility. Pt requires min-max assist overall for ADL. Recommending SNF for follow up to maximize independence and safety with ADL and functional mobility prior to return home. Pt would benefit from continued skilled OT to address established goals.    Follow Up Recommendations  SNF;Supervision/Assistance - 24 hour    Equipment Recommendations  Other (comment)(TBD at next venue of care)    Recommendations for Other Services       Precautions / Restrictions Precautions Precautions: Fall Restrictions Weight Bearing Restrictions: No LLE Weight Bearing: Weight bearing as tolerated      Mobility Bed Mobility               General bed mobility comments: Pt OOB in chair upon arrival  Transfers Overall transfer level: Needs assistance Equipment used: Rolling walker (2 wheeled) Transfers: Sit to/from Stand Sit to Stand: Max assist;+2 physical assistance         General transfer comment: Cues for hand placement, max assist +2 to boost up from chair    Balance Overall balance assessment: Needs assistance Sitting-balance support: Feet supported Sitting balance-Leahy Scale: Fair     Standing balance support: Bilateral upper extremity supported Standing balance-Leahy Scale:  Poor                             ADL either performed or assessed with clinical judgement   ADL Overall ADL's : Needs assistance/impaired Eating/Feeding: Set up;Sitting   Grooming: Set up;Supervision/safety;Sitting   Upper Body Bathing: Minimal assistance;Sitting   Lower Body Bathing: Maximal assistance;Sit to/from stand   Upper Body Dressing : Minimal assistance;Sitting   Lower Body Dressing: Maximal assistance;Sit to/from stand   Toilet Transfer: Minimal assistance;+2 for physical assistance;Ambulation;BSC;RW Toilet Transfer Details (indicate cue type and reason): Simulated by sit to stand from chair with max assist +2 and functional mobility in room         Functional mobility during ADLs: Minimal assistance;+2 for physical assistance;Rolling walker       Vision         Perception     Praxis      Pertinent Vitals/Pain Pain Assessment: Faces Faces Pain Scale: Hurts even more Pain Location: L hip Pain Descriptors / Indicators: Aching;Grimacing Pain Intervention(s): Monitored during session     Hand Dominance     Extremity/Trunk Assessment Upper Extremity Assessment Upper Extremity Assessment: Generalized weakness   Lower Extremity Assessment Lower Extremity Assessment: Defer to PT evaluation       Communication Communication Communication: No difficulties   Cognition Arousal/Alertness: Awake/alert Behavior During Therapy: WFL for tasks assessed/performed Overall Cognitive Status: No family/caregiver present to determine baseline cognitive functioning  General Comments  SpO2 90% on RA following mobility; reapplied 1L supplemental O2 at end of session    Exercises     Shoulder Instructions      Home Living Family/patient expects to be discharged to:: Private residence Living Arrangements: Alone                                      Prior Functioning/Environment  Level of Independence: Independent        Comments: Pt reports he was independent with ADL PTA and did not use AD for mobility. Unsure of accuracy of information provided by pt--known hx of dementia.         OT Problem List: Decreased strength;Decreased activity tolerance;Impaired balance (sitting and/or standing);Decreased safety awareness;Decreased knowledge of use of DME or AE;Decreased knowledge of precautions;Pain      OT Treatment/Interventions: Self-care/ADL training;Energy conservation;DME and/or AE instruction;Therapeutic activities;Balance training;Patient/family education    OT Goals(Current goals can be found in the care plan section) Acute Rehab OT Goals Patient Stated Goal: get better and return home OT Goal Formulation: With patient Time For Goal Achievement: 05/08/17 Potential to Achieve Goals: Good ADL Goals Pt Will Perform Lower Body Bathing: with supervision;sit to/from stand(with or without AE) Pt Will Perform Lower Body Dressing: with supervision;sit to/from stand(with or without AE) Pt Will Transfer to Toilet: with supervision;ambulating;bedside commode Pt Will Perform Toileting - Clothing Manipulation and hygiene: with supervision;sit to/from stand  OT Frequency: Min 2X/week   Barriers to D/C: Decreased caregiver support  pt reports he lives alone       Co-evaluation PT/OT/SLP Co-Evaluation/Treatment: Yes Reason for Co-Treatment: For patient/therapist safety   OT goals addressed during session: ADL's and self-care      AM-PAC PT "6 Clicks" Daily Activity     Outcome Measure Help from another person eating meals?: A Little Help from another person taking care of personal grooming?: A Little Help from another person toileting, which includes using toliet, bedpan, or urinal?: A Lot Help from another person bathing (including washing, rinsing, drying)?: A Lot Help from another person to put on and taking off regular upper body clothing?: A Little Help  from another person to put on and taking off regular lower body clothing?: A Lot 6 Click Score: 15   End of Session Equipment Utilized During Treatment: Gait belt;Rolling walker;Left knee immobilizer;Oxygen  Activity Tolerance: Patient tolerated treatment well Patient left: in chair;with call bell/phone within reach  OT Visit Diagnosis: Unsteadiness on feet (R26.81);Other abnormalities of gait and mobility (R26.89);History of falling (Z91.81);Pain Pain - Right/Left: Left Pain - part of body: Hip                Time: 0263-7858 OT Time Calculation (min): 15 min Charges:  OT General Charges $OT Visit: 1 Visit OT Evaluation $OT Eval Moderate Complexity: 1 Mod G-Codes:     Mehtaab Mayeda A. Ulice Brilliant, M.S., OTR/L Pager: [place] 04/24/2017, 10:31 AM

## 2017-04-24 NOTE — Evaluation (Signed)
Physical Therapy Evaluation Patient Details Name: [name] MRN: [id number] DOB: 09/27/1940 Today's Date: 04/24/2017   History of Present Illness  Pt is 77 y.o. male who recently underwent ORIF for a L distal femur fracture following a fall. Pt. has medical history of chronic back pain, HTN, CAD, atrial fibrillation, arthritis in both knees, anxiety, neuropathy, MI, and depression. PMHx also includes two lumbar surgeries, R TKA, R TKR, as well as a joint replacement in L knee.    Clinical Impression  Patient is s/p above surgery resulting in functional limitations due to the deficits listed below (see PT Problem List). Pt with hx of dementia and unreliable historian, no family present. Pt reports independence with all mobility living alone at home. Upon eval pt presents with cognitive deficits, post op pain and generalized weakness that limit his mobility. Currently +2 physical assistance for mobility and able to ambulate short distances in hospital room with min A x2.  Patient will benefit from skilled PT to increase their independence and safety with mobility to allow discharge to the venue listed below.       Follow Up Recommendations SNF;Supervision/Assistance - 24 hour    Equipment Recommendations  Rolling walker with 5" wheels    Recommendations for Other Services       Precautions / Restrictions Precautions Precautions: Fall Restrictions Weight Bearing Restrictions: No LLE Weight Bearing: Weight bearing as tolerated      Mobility  Bed Mobility               General bed mobility comments: Pt OOB in chair upon arrival  Transfers Overall transfer level: Needs assistance Equipment used: Rolling walker (2 wheeled) Transfers: Sit to/from Stand Sit to Stand: Max assist;+2 physical assistance         General transfer comment: Cues for hand placement, max assist +2 to boost up from chair  Ambulation/Gait Ambulation/Gait assistance: Min assist;+2 physical  assistance Ambulation Distance (Feet): 20 Feet Assistive device: Rolling walker (2 wheeled) Gait Pattern/deviations: Step-to pattern Gait velocity: decreased      Stairs            Wheelchair Mobility    Modified Rankin (Stroke Patients Only)       Balance Overall balance assessment: Needs assistance Sitting-balance support: Feet supported Sitting balance-Leahy Scale: Fair     Standing balance support: Bilateral upper extremity supported Standing balance-Leahy Scale: Poor                               Pertinent Vitals/Pain Pain Assessment: Faces Faces Pain Scale: Hurts even more Pain Location: L hip Pain Descriptors / Indicators: Aching;Grimacing Pain Intervention(s): Limited activity within patient's tolerance;Monitored during session;Premedicated before session;Repositioned    Home Living Family/patient expects to be discharged to:: Skilled nursing facility Living Arrangements: Alone               Additional Comments: Pt unreliable historian     Prior Function Level of Independence: Independent         Comments: Pt reports he was independent with ADL PTA and did not use AD for mobility. Unsure of accuracy of information provided by pt--known hx of dementia.      Hand Dominance        Extremity/Trunk Assessment   Upper Extremity Assessment Upper Extremity Assessment: Generalized weakness    Lower Extremity Assessment Lower Extremity Assessment: (RLE strength 4/5, LLE strength weak post op pain)  Communication   Communication: No difficulties  Cognition Arousal/Alertness: Awake/alert Behavior During Therapy: WFL for tasks assessed/performed Overall Cognitive Status: No family/caregiver present to determine baseline cognitive functioning                                        General Comments General comments (skin integrity, edema, etc.): SpO2 90% on RA following moblity, reapplied 1L supplamental O2  at end of session    Exercises     Assessment/Plan    PT Assessment Patient needs continued PT services  PT Problem List Decreased strength;Decreased range of motion;Decreased activity tolerance;Decreased balance;Decreased mobility;Pain;Decreased cognition;Decreased knowledge of use of DME;Decreased safety awareness       PT Treatment Interventions DME instruction;Gait training;Stair training;Functional mobility training;Therapeutic activities;Therapeutic exercise;Balance training    PT Goals (Current goals can be found in the Care Plan section)  Acute Rehab PT Goals Patient Stated Goal: get better and return home PT Goal Formulation: With patient Time For Goal Achievement: 05/01/17 Potential to Achieve Goals: Fair    Frequency Min 3X/week   Barriers to discharge        Co-evaluation PT/OT/SLP Co-Evaluation/Treatment: Yes Reason for Co-Treatment: For patient/therapist safety PT goals addressed during session: Balance;Mobility/safety with mobility;Proper use of DME;Strengthening/ROM OT goals addressed during session: ADL's and self-care       AM-PAC PT "6 Clicks" Daily Activity  Outcome Measure Difficulty turning over in bed (including adjusting bedclothes, sheets and blankets)?: Unable Difficulty moving from lying on back to sitting on the side of the bed? : Unable Difficulty sitting down on and standing up from a chair with arms (e.g., wheelchair, bedside commode, etc,.)?: Unable Help needed moving to and from a bed to chair (including a wheelchair)?: A Lot Help needed walking in hospital room?: A Lot Help needed climbing 3-5 steps with a railing? : Total 6 Click Score: 8    End of Session Equipment Utilized During Treatment: Gait belt Activity Tolerance: Patient limited by fatigue Patient left: in chair;with call bell/phone within reach Nurse Communication: Mobility status PT Visit Diagnosis: Unsteadiness on feet (R26.81);Other abnormalities of gait and mobility  (R26.89);Muscle weakness (generalized) (M62.81);Repeated falls (R29.6);Pain Pain - Right/Left: Left Pain - part of body: Hip    Time: 6045-4098 PT Time Calculation (min) (ACUTE ONLY): 21 min   Charges:   PT Evaluation $PT Eval Moderate Complexity: 1 Mod     PT G Codes:        Reinaldo Berber, PT, DPT Acute Rehab Services Pager: [phone]    Reinaldo Berber 04/24/2017, 11:52 AM

## 2017-04-24 NOTE — NC FL2 (Signed)
[place] LEVEL OF CARE SCREENING TOOL     IDENTIFICATION  Patient Name: [name] Birthdate: [birth year] Sex: male Admission Date (Current Location): 04/22/2017  [facility] and [place] Number:  Herbalist and Address:  [facility]. [facility], [place] [address], [place], [place] [address]      Provider Number: [id number]  Attending Physician Name and Address:  Janece Canterbury, MD  Relative Name and Phone Number:   Demondre Aguas, spouse, [phone]    Current Level of Care: Hospital Recommended Level of Care: [place] Prior Approval Number:    Date Approved/Denied:   PASRR Number: [id number]   Discharge Plan: Home    Current Diagnoses: Patient Active Problem List   Diagnosis Date Noted  . Femur fracture, left ([place]) 04/22/2017  . Hypertension   . Hyperlipidemia 05/21/2015  . Prediabetes 05/21/2015  . Vitamin D deficiency 05/21/2015  . Medication management 05/21/2015  . GERD (gastroesophageal reflux disease) 05/21/2015  . ASHD (arteriosclerotic heart disease) 05/21/2015  . CIDP (chronic inflammatory demyelinating polyneuropathy) ([place]) 04/28/2014  . Lower extremity weakness 03/16/2014  . Hereditary and idiopathic peripheral neuropathy 03/16/2014  . Ataxia 03/16/2014  . Axonal sensorimotor neuropathy 03/16/2014  . Atrial fibrillation ([place]) 07/12/2013  . Foot drop, bilateral 09/08/2012  . Chronic low back pain 06/23/2012  . S/P right TK revision 05/13/2011  . Obstructive sleep apnea 12/16/2006  . Essential hypertension 11/24/2006  . Coronary artery disease 02/19/1996    Orientation RESPIRATION BLADDER Height & Weight     Self, Time, Situation, Place  O2 Incontinent, Indwelling catheter Weight: 201 lb 1 oz (91.2 kg) Height:  5\' 9"  (175.3 cm)  BEHAVIORAL SYMPTOMS/MOOD NEUROLOGICAL BOWEL NUTRITION STATUS      Continent Diet  AMBULATORY STATUS COMMUNICATION OF NEEDS Skin   Extensive Assist Verbally Surgical  wounds, Skin abrasions                       Personal Care Assistance Level of Assistance  Feeding, Bathing, Dressing Bathing Assistance: Maximum assistance Feeding assistance: Independent Dressing Assistance: Maximum assistance     Functional Limitations Info  Sight, Hearing, Speech Sight Info: Adequate Hearing Info: Adequate Speech Info: Adequate    SPECIAL CARE FACTORS FREQUENCY  PT (By licensed PT), OT (By licensed OT)     PT Frequency: 5x week OT Frequency: 5x week            Contractures Contractures Info: Not present    Additional Factors Info  Code Status, Allergies Code Status Info: Partial Code- No CPR/No Intubation Allergies Info: ACETAMINOPHEN, LASIX FUROSEMIDE            Current Medications (04/24/2017):  This is the current hospital active medication list Current Facility-Administered Medications  Medication Dose Route Frequency Provider Last Rate Last Dose  . 0.9 %  sodium chloride infusion   Intravenous Continuous Annye Asa, MD 10 mL/hr at 04/23/17 1427    . atorvastatin (LIPITOR) tablet 10 mg  10 mg Oral Daily Radene Gunning, NP   10 mg at 04/24/17 0935  . ceFAZolin (ANCEF) IVPB 2g/100 mL premix  2 g Intravenous Q8H Haddix, Thomasene Lot, MD 200 mL/hr at 04/24/17 0533 2 g at 04/24/17 0533  . dextrose 5 %-0.9 % sodium chloride infusion   Intravenous Continuous Janece Canterbury, MD 75 mL/hr at 04/23/17 0959    . escitalopram (LEXAPRO) tablet 20 mg  20 mg Oral Daily Black, Lezlie Octave, NP   20 mg at  04/24/17 0935  . gabapentin (NEURONTIN) capsule 300 mg  300 mg Oral TID Radene Gunning, NP   300 mg at 04/24/17 0935  . HYDROcodone-acetaminophen (NORCO) 7.5-325 MG per tablet 1 tablet  1 tablet Oral 5 X Daily Karmen Bongo, MD   1 tablet at 04/24/17 0935  . methocarbamol (ROBAXIN) tablet 500 mg  500 mg Oral Q6H PRN Radene Gunning, NP   500 mg at 04/24/17 0030   Or  . methocarbamol (ROBAXIN) 500 mg in dextrose 5 % 50 mL IVPB  500 mg Intravenous Q6H PRN  Radene Gunning, NP      . morphine 4 MG/ML injection 2 mg  2 mg Intravenous Q2H PRN Karmen Bongo, MD   2 mg at 04/24/17 0030  . multivitamin with minerals tablet 1 tablet  1 tablet Oral Daily Janece Canterbury, MD   1 tablet at 04/24/17 0935  . mupirocin ointment (BACTROBAN) 2 % 1 application  1 application Topical Daily Black, Karen M, NP      . OLANZapine [facility]) tablet 7.5 mg  7.5 mg Oral Daily Radene Gunning, NP   7.5 mg at 04/24/17 0935  . pantoprazole (PROTONIX) EC tablet 40 mg  40 mg Oral Daily Radene Gunning, NP   40 mg at 04/24/17 0935  . senna-docusate (Senokot-S) tablet 1 tablet  1 tablet Oral QHS PRN Black, Lezlie Octave, NP         Discharge Medications: Please see discharge summary for a list of discharge medications.  Relevant Imaging Results:  Relevant Lab Results:   Additional Information SS# [place], LCSW

## 2017-04-24 NOTE — Social Work (Addendum)
CSW sent offers to SNF's.  CSW will f/u for SNF placement.  2:29pm: [facility] has offered a SNF bed. CSW confirmed with patient. SNF will obtain Insurance Auth.  CSW will f/u for disposition.  4:42pm: SNF obtained Civil Service fast streamer.  Elissa Hefty, LCSW Clinical Social Worker [phone]

## 2017-04-25 ENCOUNTER — Encounter (HOSPITAL_COMMUNITY): Payer: Self-pay | Admitting: Student

## 2017-04-25 LAB — BASIC METABOLIC PANEL
ANION GAP: 8 (ref 5–15)
BUN: 27 mg/dL — ABNORMAL HIGH (ref 6–20)
CO2: 23 mmol/L (ref 22–32)
Calcium: 8.2 mg/dL — ABNORMAL LOW (ref 8.9–10.3)
Chloride: 106 mmol/L (ref 101–111)
Creatinine, Ser: 1.39 mg/dL — ABNORMAL HIGH (ref 0.61–1.24)
GFR, EST AFRICAN AMERICAN: 54 mL/min — AB (ref >60)
GFR, EST NON AFRICAN AMERICAN: 47 mL/min — AB (ref >60)
GLUCOSE: 104 mg/dL — AB (ref 65–99)
POTASSIUM: 4.6 mmol/L (ref 3.5–5.1)
Sodium: 137 mmol/L (ref 135–145)

## 2017-04-25 LAB — CBC
HEMATOCRIT: 23.6 % — AB (ref 39.0–52.0)
HEMOGLOBIN: 7.2 g/dL — AB (ref 13.0–17.0)
MCH: 26.8 pg (ref 26.0–34.0)
MCHC: 30.5 g/dL (ref 30.0–36.0)
MCV: 87.7 fL (ref 78.0–100.0)
Platelets: 194 10*3/uL (ref 150–400)
RBC: 2.69 MIL/uL — AB (ref 4.22–5.81)
RDW: 18.2 % — ABNORMAL HIGH (ref 11.5–15.5)
WBC: 11.4 10*3/uL — ABNORMAL HIGH (ref 4.0–10.5)

## 2017-04-25 LAB — HEMOGLOBIN AND HEMATOCRIT, BLOOD
HEMATOCRIT: 28.1 % — AB (ref 39.0–52.0)
Hemoglobin: 8.5 g/dL — ABNORMAL LOW (ref 13.0–17.0)

## 2017-04-25 LAB — PREPARE RBC (CROSSMATCH)

## 2017-04-25 MED ORDER — SODIUM CHLORIDE 0.9 % IV SOLN
Freq: Once | INTRAVENOUS | Status: AC
  Administered 2017-04-25: 12:00:00 via INTRAVENOUS

## 2017-04-25 MED ORDER — TORSEMIDE 20 MG PO TABS
20.0000 mg | ORAL_TABLET | ORAL | Status: DC
  Administered 2017-04-25: 20 mg via ORAL
  Filled 2017-04-25: qty 1

## 2017-04-25 MED ORDER — HYDROCODONE-ACETAMINOPHEN 7.5-325 MG PO TABS
1.0000 | ORAL_TABLET | Freq: Four times a day (QID) | ORAL | 0 refills | Status: AC | PRN
Start: 2017-04-25 — End: ?

## 2017-04-25 NOTE — Progress Notes (Signed)
PROGRESS NOTE  [name]  OAC:[id number] DOB: 1940-07-16 DOA: 04/22/2017 PCP: Unk Pinto, MD  Brief Narrative:  77 y.o. male with history of chronic back pain; neuropathy, HTN, HLD, afib, BPH, and CAD s/p PCI who presented with left hip pain s/p mechanical fall.  He was found to have a left distal periprosthetic femur fracture.  Underwent ORIF of his left periprosthetic distal femur fracture on 3/6 by Dr. Doreatha Martin.  Assessment & Plan:  Left periprosthetic femur fracture related to mechanical fall status post ORIF on 3/6 by Dr. Doreatha Martin -  Continue Eliquis for DVT proph -  PT recommending skilled nursing facility, social work aware -  DC Foley > voiding okay   Hypertension. Blood pressure improved - continue Coreg   Paroxysmal atrial fibrillation.  Currently rate controlled a-fib.  Mali score 4, needs anticoagulation - continue Eliquis - continue beta blocker  Obstructive sleep apnea   Hyperlipidemia/ CAD/history of MI 2 in 1997 and 1998 status post PCA/stent in 2012.  -  Continue eliquis and BB -  Continue statin  CKD stage III, unclear baseline creatinine, but probably between 1.4-1.8, trended down with IVF -  Minimize nephrotoxins and renally dose medications  Neuropathy, stable, continue gabapentin  GERD, stable, continue PPI  Postoperative anemia superimposed on anemia of renal disease -  Transfuse 1 unit PRBC -Consider iron supplementation at discharge  Constipation Continue Colace, senna and schedule MiraLAX-   DVT prophylaxis: Apixaban Code Status:  Partial:  Cardioversion, medications, and bipap acceptable, but not chest compressions or intubation Family Communication:  Patient alone Disposition Plan:  Anticipate discharge to SNF tomorrow   Consultants:   Orthopedic surgery, Dr. Jacqulynn Cadet  Procedures:  ORIF on 3/6  Antimicrobials:  Anti-infectives (From admission, onward)   Start     Dose/Rate Route Frequency Ordered Stop   04/23/17 2200   ceFAZolin (ANCEF) IVPB 2g/100 mL premix     2 g 200 mL/hr over 30 Minutes Intravenous Every 8 hours 04/23/17 1919 04/24/17 1431   04/23/17 1558  vancomycin (VANCOCIN) powder  Status:  Discontinued       As needed 04/23/17 1558 04/23/17 1744   04/23/17 0800  ceFAZolin (ANCEF) IVPB 2g/100 mL premix  Status:  Discontinued     2 g 200 mL/hr over 30 Minutes Intravenous To Tyrel Lex Stay 04/23/17 0740 04/23/17 1904       Subjective:  States he is not feeling very well today.  He is having some back pain.  His back pain is chronic but worse today and he feels like that pain is worse than his leg pain.  Ate some breakfast  Objective: Vitals:   04/25/17 1136 04/25/17 1151 04/25/17 1350 04/25/17 1430  BP: 96/60 (!) 99/49 117/63 112/62  Pulse: 77 72 77 74  Resp:   18 18  Temp: 97.7 F (36.5 C) 97.9 F (36.6 C) 97.6 F (36.4 C) 97.7 F (36.5 C)  TempSrc: Oral Oral Oral Oral  SpO2: 95% 95% 94% 92%  Weight:      Height:        Intake/Output Summary (Last 24 hours) at 04/25/2017 1725 Last data filed at 04/25/2017 1522 Gross per 24 hour  Intake 360 ml  Output 1425 ml  Net -1065 ml   Filed Weights   04/22/17 2316  Weight: 91.2 kg (201 lb 1 oz)    Examination:  General exam:  Adult male.  No acute distress.  HEENT:  NCAT, MMM Respiratory system: Clear to auscultation bilaterally Cardiovascular system:  IRRR, normal S1/S2. No murmurs, rubs, gallops or clicks.  Warm extremities Gastrointestinal system: Normal active bowel sounds, soft, nondistended, nontender. MSK:  Normal tone and bulk, trace left lower extremity edema.  Left thigh area bandage has some blood on the dressing, no palpable hematoma.  Less than 2-second cap refill of the left lower extremity.  Able to wiggle his toes bilaterally.    neuro:  Decreased sensation of the bilateral feet which is chronic and stable.  Sensation intact of her bilateral knees  Data Reviewed: I have personally reviewed following labs and imaging  studies  CBC: Recent Labs  Lab 04/22/17 1204 04/23/17 0017 04/24/17 0439 04/25/17 0612  WBC 12.7* 11.0* 10.5 11.4*  NEUTROABS 9.9*  --   --   --   HGB 10.0* 9.1* 8.0* 7.2*  HCT 31.9* 29.6* 26.4* 23.6*  MCV 85.5 85.8 87.1 87.7  PLT 220 208 188 242   Basic Metabolic Panel: Recent Labs  Lab 04/22/17 1204 04/23/17 0017 04/24/17 0439 04/25/17 0612  NA 136 136 136 137  K 4.1 3.8 4.4 4.6  CL 103 100* 106 106  CO2 22 25 23 23   GLUCOSE 115* 137* 152* 104*  BUN 26* 29* 25* 27*  CREATININE 1.70* 2.01* 1.54* 1.39*  CALCIUM 9.2 8.7* 7.8* 8.2*   GFR: Estimated Creatinine Clearance: 48.9 mL/min (A) (by C-G formula based on SCr of 1.39 mg/dL (H)). Liver Function Tests: No results for input(s): AST, ALT, ALKPHOS, BILITOT, PROT, ALBUMIN in the last 168 hours. No results for input(s): LIPASE, AMYLASE in the last 168 hours. No results for input(s): AMMONIA in the last 168 hours. Coagulation Profile: Recent Labs  Lab 04/22/17 1517 04/22/17 2338  INR 1.74 1.72   Cardiac Enzymes: No results for input(s): CKTOTAL, CKMB, CKMBINDEX, TROPONINI in the last 168 hours. BNP (last 3 results) No results for input(s): PROBNP in the last 8760 hours. HbA1C: No results for input(s): HGBA1C in the last 72 hours. CBG: No results for input(s): GLUCAP in the last 168 hours. Lipid Profile: No results for input(s): CHOL, HDL, LDLCALC, TRIG, CHOLHDL, LDLDIRECT in the last 72 hours. Thyroid Function Tests: No results for input(s): TSH, T4TOTAL, FREET4, T3FREE, THYROIDAB in the last 72 hours. Anemia Panel: No results for input(s): VITAMINB12, FOLATE, FERRITIN, TIBC, IRON, RETICCTPCT in the last 72 hours. Urine analysis:    Component Value Date/Time   COLORURINE YELLOW 06/28/2016 1011   APPEARANCEUR CLEAR 06/28/2016 1011   LABSPEC 1.007 06/28/2016 1011   PHURINE 6.5 06/28/2016 1011   GLUCOSEU NEGATIVE 06/28/2016 1011   HGBUR NEGATIVE 06/28/2016 1011   HGBUR negative 09/18/2007 0834    BILIRUBINUR NEGATIVE 06/28/2016 1011   BILIRUBINUR neg 04/11/2013 1836   KETONESUR NEGATIVE 06/28/2016 1011   PROTEINUR NEGATIVE 06/28/2016 1011   UROBILINOGEN 0.2 07/12/2013 0802   NITRITE NEGATIVE 06/28/2016 1011   LEUKOCYTESUR NEGATIVE 06/28/2016 1011   Sepsis Labs: @LABRCNTIP (procalcitonin:4,lacticidven:4)  ) Recent Results (from the past 240 hour(s))  Surgical pcr screen     Status: None   Collection Time: 04/22/17 11:29 PM  Result Value Ref Range Status   MRSA, PCR NEGATIVE NEGATIVE Final   Staphylococcus aureus NEGATIVE NEGATIVE Final    Comment: (NOTE) The Xpert SA Assay (FDA approved for NASAL specimens in patients 49 years of age and older), is one component of a comprehensive surveillance program. It is not intended to diagnose infection nor to guide or monitor treatment. Performed at [facility], [place] [address] [place], [place] [address]  Radiology Studies: Dg Femur Port Min 2 Views Left  Result Date: 04/23/2017 CLINICAL DATA:  History of recent ORIF of distal left femoral fracture. EXAM: LEFT FEMUR PORTABLE 2 VIEWS COMPARISON:  Intraoperative films from earlier in the same day. FINDINGS: Changes consistent with the recent distal left femoral fracture are noted. Lateral fixation sideplate with multiple fixation screws is seen. The fracture fragments are in near anatomic alignment. No other focal abnormality is noted. IMPRESSION: Status post ORIF of distal left femoral fracture. Electronically Signed   By: Inez Catalina M.D.   On: 04/23/2017 20:20     Scheduled Meds: . apixaban  5 mg Oral BID  . atorvastatin  10 mg Oral Daily  . carvedilol  6.25 mg Oral BID WC  . docusate sodium  100 mg Oral BID  . escitalopram  20 mg Oral Daily  . gabapentin  300 mg Oral TID  . HYDROcodone-acetaminophen  1 tablet Oral 5 X Daily  . multivitamin with minerals  1 tablet Oral Daily  . mupirocin ointment  1 application Topical Daily  . OLANZapine  7.5 mg Oral Daily  .  pantoprazole  40 mg Oral Daily  . polyethylene glycol  17 g Oral Daily  . senna  2 tablet Oral QHS  . torsemide  20 mg Oral Q48H   Continuous Infusions: . sodium chloride 10 mL/hr at 04/23/17 1427  . methocarbamol (ROBAXIN)  IV       LOS: 3 days    Time spent: 30 min    Janece Canterbury, MD [facility] Pager [phone]  If 7PM-7AM, please contact night-coverage [email or web address] Password [email or web address] 04/25/2017, 5:25 PM

## 2017-04-25 NOTE — Social Work (Addendum)
CSW has received confirmation of insurance authorization, facility is able to take pt when medically appropriate for discharge to [facility]. CSW has f/u with pt and pt daughter, they are both aware and amenable to plan including [facility] transport.  CSW continuing to follow.   Alexander Mt, [place] Work [phone]

## 2017-04-25 NOTE — Clinical Social Work Placement (Signed)
   CLINICAL SOCIAL WORK PLACEMENT  NOTE [facility]  Date:  04/25/2017  Patient Details  Name: [name] MRN: [id number] Date of Birth: 06-27-1940  Clinical Social Work is seeking post-discharge placement for this patient at the [place] level of care (*CSW will initial, date and re-position this form in  chart as items are completed):  Yes   Patient/family provided with [facility] Work Department's list of facilities offering this level of care within the geographic area requested by the patient (or if unable, by the patient's family).  Yes   Patient/family informed of their freedom to choose among providers that offer the needed level of care, that participate in Medicare, Medicaid or managed care program needed by the patient, have an available bed and are willing to accept the patient.  Yes   Patient/family informed of [facility]'s ownership interest in [facility] and [facility], as well as of the fact that they are under no obligation to receive care at these facilities.  PASRR submitted to EDS on       PASRR number received on 04/23/17     Existing PASRR number confirmed on       FL2 transmitted to all facilities in geographic area requested by pt/family on 04/23/17     FL2 transmitted to all facilities within larger geographic area on       Patient informed that his/her managed care company has contracts with or will negotiate with certain facilities, including the following:        Yes   Patient/family informed of bed offers received.  Patient chooses bed at [place], [place]     Physician recommends and patient chooses bed at      Patient to be transferred to [place], [place] on 04/26/17.  Patient to be transferred to facility by PTAR     Patient family notified on 04/25/17 of transfer.  Name of family member notified:  Vaughan Basta, daughter     PHYSICIAN Please prepare priority discharge  summary, including medications     Additional Comment:    _______________________________________________ Alexander Mt, [place] 04/25/2017, 5:09 PM

## 2017-04-25 NOTE — Progress Notes (Signed)
Orthopaedic Trauma Progress Note  S: Doing well, pain controlled, able to ambulate to door and back  O:  Vitals:   04/24/17 2120 04/25/17 0423  BP: 131/75 (!) 98/56  Pulse: 83 73  Resp: 18 18  Temp: 98.2 F (36.8 C) 98 F (36.7 C)  SpO2: 93% 96%  Gen: NAD LLE: Incision clean, dry and intact, compartments soft and compressible. Neuro exam is at baseline  Labs:  CBC    Component Value Date/Time   WBC 10.5 04/24/2017 0439   RBC 3.03 (L) 04/24/2017 0439   HGB 8.0 (L) 04/24/2017 0439   HCT 26.4 (L) 04/24/2017 0439   PLT 188 04/24/2017 0439   MCV 87.1 04/24/2017 0439   MCV 95.6 04/11/2013 1836   MCH 26.4 04/24/2017 0439   MCHC 30.3 04/24/2017 0439   RDW 18.0 (H) 04/24/2017 0439   RDW 17.4 (H) 03/16/2014 1101   LYMPHSABS 1.9 04/22/2017 1204   LYMPHSABS 2.9 03/16/2014 1101   MONOABS 0.8 04/22/2017 1204   EOSABS 0.1 04/22/2017 1204   EOSABS 0.6 (H) 03/16/2014 1101   BASOSABS 0.0 04/22/2017 1204   BASOSABS 0.1 03/16/2014 11068    A/P: 77 year old male with left periprosthetic femur fracture  -WBAT LLE -PT/OT -Pain control -Dry dressing PRN -Follow up in 2 weeks  Shona Needles, MD [facility] [phone] (phone)

## 2017-04-25 NOTE — Care Management Important Message (Signed)
Important Message  Patient Details  Name: IVIE MAESE MRN: [id number] Date of Birth: 03-10-1940   Medicare Important Message Given:  Yes    Kaisa Wofford 04/25/2017, 1:19 PM

## 2017-04-25 NOTE — Progress Notes (Signed)
Physical Therapy Treatment Patient Details Name: [name] MRN: [id number] DOB: [birth year] Today's Date: 04/25/2017    History of Present Illness Pt is 77 y.o. male who recently underwent ORIF for a L distal femur fracture following a fall. Pt. has medical history of chronic back pain, HTN, CAD, atrial fibrillation, arthritis in both knees, anxiety, neuropathy, MI, and depression. PMHx also includes two lumbar surgeries, R TKA, R TKR, as well as a joint replacement in L knee.    PT Comments    Pt was able to walk a few short feet, sit and rest and walk again.  He has very poor endurance, especially in his arms, making using them on the RW to support his body over his painful left leg difficult.  He remains appropriate for SNF level rehab at discharge.   Follow Up Recommendations  SNF;Supervision/Assistance - 24 hour     Equipment Recommendations  Rolling walker with 5" wheels    Recommendations for Other Services   NA     Precautions / Restrictions Precautions Precautions: Fall Restrictions LLE Weight Bearing: Weight bearing as tolerated    Mobility  Bed Mobility               General bed mobility comments: Pt was OOB in the chair.   Transfers Overall transfer level: Needs assistance Equipment used: Rolling walker (2 wheeled) Transfers: Sit to/from Stand Sit to Stand: +2 safety/equipment;Mod assist         General transfer comment: Two person heavy mod assist to transition to stand multiple times from low recliner chair.  Pt needed cues and manual assist for foot placement and hand placement during transitions.    Ambulation/Gait Ambulation/Gait assistance: +2 safety/equipment;Mod assist Ambulation Distance (Feet): 5 Feet(x2) Assistive device: Rolling walker (2 wheeled) Gait Pattern/deviations: Step-to pattern;Antalgic Gait velocity: decreased   General Gait Details: Pt needed cues for upright posture "look up", and tucking his bottom up under him.  He  has limited UE endurance and could only take a few steps before fatiguing.  Chair closely behind.           Balance Overall balance assessment: Needs assistance Sitting-balance support: Feet supported;Bilateral upper extremity supported Sitting balance-Leahy Scale: Fair Sitting balance - Comments: able to bring back off of recliner chair with UE assist.    Standing balance support: Bilateral upper extremity supported Standing balance-Leahy Scale: Poor Standing balance comment: needs external assist from RW and therapist.                             Cognition Arousal/Alertness: Awake/alert Behavior During Therapy: WFL for tasks assessed/performed Overall Cognitive Status: No family/caregiver present to determine baseline cognitive functioning                                        Exercises Total Joint Exercises Heel Slides: AAROM;Left;10 reps Hip ABduction/ADduction: AAROM;Left;10 reps Long Arc Quad: AROM;Both;10 reps        Pertinent Vitals/Pain Pain Assessment: Faces Faces Pain Scale: Hurts even more Pain Location: L hip Pain Descriptors / Indicators: Aching;Grimacing Pain Intervention(s): Limited activity within patient's tolerance;Monitored during session;Repositioned           PT Goals (current goals can now be found in the care plan section) Acute Rehab PT Goals Patient Stated Goal: get better and return home Progress towards PT goals: Progressing  toward goals    Frequency    Min 3X/week      PT Plan Current plan remains appropriate       AM-PAC PT "6 Clicks" Daily Activity  Outcome Measure  Difficulty turning over in bed (including adjusting bedclothes, sheets and blankets)?: Unable Difficulty moving from lying on back to sitting on the side of the bed? : Unable Difficulty sitting down on and standing up from a chair with arms (e.g., wheelchair, bedside commode, etc,.)?: Unable Help needed moving to and from a bed to  chair (including a wheelchair)?: A Lot Help needed walking in hospital room?: A Lot Help needed climbing 3-5 steps with a railing? : Total 6 Click Score: 8    End of Session Equipment Utilized During Treatment: Gait belt Activity Tolerance: Patient limited by fatigue;Patient limited by pain Patient left: in chair;with call bell/phone within reach;with chair alarm set   PT Visit Diagnosis: Unsteadiness on feet (R26.81);Other abnormalities of gait and mobility (R26.89);Muscle weakness (generalized) (M62.81);Repeated falls (R29.6);Pain Pain - Right/Left: Left Pain - part of body: Hip     Time: 1218-1230 PT Time Calculation (min) (ACUTE ONLY): 12 min  Charges:  $Gait Training: 8-22 mins     Joshlyn Beadle B. Sibley, [place], DPT [phone]          04/25/2017, 2:40 PM

## 2017-04-26 DIAGNOSIS — M6281 Muscle weakness (generalized): Secondary | ICD-10-CM | POA: Diagnosis not present

## 2017-04-26 DIAGNOSIS — I482 Chronic atrial fibrillation: Secondary | ICD-10-CM | POA: Diagnosis not present

## 2017-04-26 DIAGNOSIS — M79605 Pain in left leg: Secondary | ICD-10-CM | POA: Diagnosis not present

## 2017-04-26 DIAGNOSIS — I4891 Unspecified atrial fibrillation: Secondary | ICD-10-CM | POA: Diagnosis not present

## 2017-04-26 DIAGNOSIS — Z4789 Encounter for other orthopedic aftercare: Secondary | ICD-10-CM | POA: Diagnosis not present

## 2017-04-26 DIAGNOSIS — S7292XA Unspecified fracture of left femur, initial encounter for closed fracture: Secondary | ICD-10-CM | POA: Diagnosis not present

## 2017-04-26 DIAGNOSIS — S7290XA Unspecified fracture of unspecified femur, initial encounter for closed fracture: Secondary | ICD-10-CM | POA: Diagnosis not present

## 2017-04-26 DIAGNOSIS — I2510 Atherosclerotic heart disease of native coronary artery without angina pectoris: Secondary | ICD-10-CM | POA: Diagnosis not present

## 2017-04-26 DIAGNOSIS — G8911 Acute pain due to trauma: Secondary | ICD-10-CM | POA: Diagnosis not present

## 2017-04-26 DIAGNOSIS — R278 Other lack of coordination: Secondary | ICD-10-CM | POA: Diagnosis not present

## 2017-04-26 DIAGNOSIS — R2681 Unsteadiness on feet: Secondary | ICD-10-CM | POA: Diagnosis not present

## 2017-04-26 DIAGNOSIS — E559 Vitamin D deficiency, unspecified: Secondary | ICD-10-CM | POA: Diagnosis not present

## 2017-04-26 DIAGNOSIS — G4733 Obstructive sleep apnea (adult) (pediatric): Secondary | ICD-10-CM | POA: Diagnosis not present

## 2017-04-26 DIAGNOSIS — S72452D Displaced supracondylar fracture without intracondylar extension of lower end of left femur, subsequent encounter for closed fracture with routine healing: Secondary | ICD-10-CM | POA: Diagnosis not present

## 2017-04-26 DIAGNOSIS — S72452A Displaced supracondylar fracture without intracondylar extension of lower end of left femur, initial encounter for closed fracture: Secondary | ICD-10-CM

## 2017-04-26 DIAGNOSIS — I10 Essential (primary) hypertension: Secondary | ICD-10-CM | POA: Diagnosis not present

## 2017-04-26 LAB — TYPE AND SCREEN
ABO/RH(D): B POS
ANTIBODY SCREEN: NEGATIVE
DONOR AG TYPE: NEGATIVE
Donor AG Type: NEGATIVE
Unit division: 0
Unit division: 0

## 2017-04-26 LAB — BASIC METABOLIC PANEL
ANION GAP: 12 (ref 5–15)
BUN: 32 mg/dL — ABNORMAL HIGH (ref 6–20)
CALCIUM: 8.4 mg/dL — AB (ref 8.9–10.3)
CO2: 22 mmol/L (ref 22–32)
CREATININE: 1.54 mg/dL — AB (ref 0.61–1.24)
Chloride: 103 mmol/L (ref 101–111)
GFR calc Af Amer: 48 mL/min — ABNORMAL LOW (ref >60)
GFR, EST NON AFRICAN AMERICAN: 41 mL/min — AB (ref >60)
Glucose, Bld: 102 mg/dL — ABNORMAL HIGH (ref 65–99)
Potassium: 4.7 mmol/L (ref 3.5–5.1)
SODIUM: 137 mmol/L (ref 135–145)

## 2017-04-26 LAB — BPAM RBC
Blood Product Expiration Date: 201903142359
Blood Product Expiration Date: 201903152359
ISSUE DATE / TIME: 201903061543
ISSUE DATE / TIME: 201903081132
UNIT TYPE AND RH: 5100
UNIT TYPE AND RH: 7300

## 2017-04-26 LAB — CBC
HCT: 29.1 % — ABNORMAL LOW (ref 39.0–52.0)
Hemoglobin: 8.9 g/dL — ABNORMAL LOW (ref 13.0–17.0)
MCH: 26.8 pg (ref 26.0–34.0)
MCHC: 30.6 g/dL (ref 30.0–36.0)
MCV: 87.7 fL (ref 78.0–100.0)
PLATELETS: 253 10*3/uL (ref 150–400)
RBC: 3.32 MIL/uL — ABNORMAL LOW (ref 4.22–5.81)
RDW: 19.4 % — ABNORMAL HIGH (ref 11.5–15.5)
WBC: 11.7 10*3/uL — ABNORMAL HIGH (ref 4.0–10.5)

## 2017-04-26 MED ORDER — SENNA 8.6 MG PO TABS: 2.0000 | ORAL_TABLET | Freq: Every day | ORAL | 0 refills | Status: AC

## 2017-04-26 MED ORDER — POLYETHYLENE GLYCOL 3350 17 G PO PACK: 17.0000 g | PACK | Freq: Every day | ORAL | 0 refills | Status: AC

## 2017-04-26 MED ORDER — FERROUS SULFATE 325 (65 FE) MG PO TBEC: 325.0000 mg | DELAYED_RELEASE_TABLET | Freq: Every day | ORAL | 0 refills | Status: AC

## 2017-04-26 MED ORDER — DOCUSATE SODIUM 100 MG PO CAPS: 100.0000 mg | ORAL_CAPSULE | Freq: Two times a day (BID) | ORAL | 0 refills | Status: AC

## 2017-04-26 NOTE — Progress Notes (Signed)
Called and s/w Judeen Hammans, dtr and told her Corey Harold may be delayed and that I would call her once her dad had actually left.

## 2017-04-26 NOTE — Clinical Social Work Placement (Signed)
   CLINICAL SOCIAL WORK PLACEMENT  NOTE  Date:  04/26/2017  Patient Details  Name: [name] MRN: [id number] Date of Birth: 09-02-1940  Clinical Social Work is seeking post-discharge placement for this patient at the [place] level of care (*CSW will initial, date and re-position this form in  chart as items are completed):  Yes   Patient/family provided with [facility] Work Department's list of facilities offering this level of care within the geographic area requested by the patient (or if unable, by the patient's family).  Yes   Patient/family informed of their freedom to choose among providers that offer the needed level of care, that participate in Medicare, Medicaid or managed care program needed by the patient, have an available bed and are willing to accept the patient.  Yes   Patient/family informed of 's ownership interest in [facility] and [facility], as well as of the fact that they are under no obligation to receive care at these facilities.  PASRR submitted to EDS on       PASRR number received on 04/23/17     Existing PASRR number confirmed on       FL2 transmitted to all facilities in geographic area requested by pt/family on 04/23/17     FL2 transmitted to all facilities within larger geographic area on       Patient informed that his/her managed care company has contracts with or will negotiate with certain facilities, including the following:        Yes   Patient/family informed of bed offers received.  Patient chooses bed at [place], [place]     Physician recommends and patient chooses bed at      Patient to be transferred to [place], [place] on 04/26/17.  Patient to be transferred to facility by PTAR     Patient family notified on 04/26/17 of transfer.  Name of family member notified:  Vaughan Basta, daughter     PHYSICIAN Please prepare priority discharge summary, including medications      Additional Comment:    _______________________________________________ Benard Halsted, [place] 04/26/2017, 8:27 AM

## 2017-04-26 NOTE — Discharge Summary (Signed)
Physician Discharge Summary  [name] DGU:[id number] DOB: 1941/01/30 DOA: 04/22/2017  PCP: Unk Pinto, MD  Admit date: 04/22/2017 Discharge date: 04/26/2017  Admitted From: home  Disposition:  SNF   Recommendations for Outpatient Follow-up:   1. Follow up with Dr. Doreatha Martin in 2 weeks  2. Dry dressing changes as needed 3. WBAT LLE 4. Please obtain BMP/CBC in one week    Home Health:  Ongoing PT/OT at SNF  Equipment/Devices:  none  Discharge Condition:  Stable, improved CODE STATUS:  Partial:    Cardioversion, medications, and bipap acceptable, but not chest compressions or intubation   Diet recommendation:  Regular diet   Brief/Interim Summary:  77 y.o.malewith history of chronic back pain; neuropathy, HTN, HLD, afib, BPH, and CAD s/p PCI who presented with left hip pain s/p mechanical fall.  He was found to have a left distal periprosthetic femur fracture.  Underwent ORIF of his left periprosthetic distal femur fracture on 3/6 by Dr. Doreatha Martin.  Post-operative course complicated by anemia requiring a 1 unit PRBC transfusion.    Discharge Diagnoses:  Principal Problem:   Femur fracture, left ([place]) Active Problems:   Obstructive sleep apnea   Essential hypertension   Atrial fibrillation (HCC)   CIDP (chronic inflammatory demyelinating polyneuropathy) (HCC)   Vitamin D deficiency   GERD (gastroesophageal reflux disease)  Left periprosthetic femur fracture related to mechanical fall status post ORIF on 3/6 by Dr. Doreatha Martin -  Continue Eliquis for DVT proph -  PT recommended SNF for rehabilitation -  Passed voiding trial on 3/8   Hypertension. Blood pressure low normal - continue Coreg  Paroxysmal atrial fibrillation.  Currently rate controlled a-fib.  Mali score 4, needs anticoagulation - continue Eliquis - continue beta blocker  Obstructive sleep apnea, not on cpap.   Hyperlipidemia/ CAD/history of MI 2 in 1997 and 1998 status post PCA/stent in 2012. -   Continue eliquis and BB -  Continue statin  CKD stage III, unclear baseline creatinine, but probably between 1.4-1.8, trended down with IVF -  Minimize nephrotoxins and renally dose medications  Neuropathy, stable, continue gabapentin  GERD, stable, continue PPI  Postoperative anemia superimposed on anemia of renal disease -  Transfused 1 unit PRBC on 3/8 -  Start iron supplementation x 1 month  Constipation Continue Colace, senna and MiraLAX   Discharge Instructions   Allergies as of 04/26/2017      Reactions   Acetaminophen Other (See Comments)   LIVER INFLAMMATION IN HIGH DOSES   Lasix [furosemide] Other (See Comments)   Could not function / could not get out of bed.      Medication List    TAKE these medications   atorvastatin 10 MG tablet Commonly known as:  LIPITOR Take 10 mg by mouth daily.   carvedilol 6.25 MG tablet Commonly known as:  COREG Take 6.25 mg by mouth 2 (two) times daily with a meal. 12p and 9p   docusate sodium 100 MG capsule Commonly known as:  COLACE Take 1 capsule (100 mg total) by mouth 2 (two) times daily.   ELIQUIS 5 MG Tabs tablet Generic drug:  apixaban Take 5 mg by mouth 2 (two) times daily.   escitalopram 20 MG tablet Commonly known as:  LEXAPRO TAKE 1 TABLET BY MOUTH DAILY. What changed:    how much to take  how to take this  when to take this   ferrous sulfate 325 (65 FE) MG EC tablet Take 1 tablet (325 mg total) by mouth  daily with breakfast.   gabapentin 300 MG capsule Commonly known as:  NEURONTIN Take 300 mg by mouth 3 (three) times daily.   HYDROcodone-acetaminophen 7.5-325 MG tablet Commonly known as:  NORCO Take 1 tablet by mouth every 6 (six) hours as needed for moderate pain. What changed:    when to take this  reasons to take this   NITROSTAT 0.4 MG SL tablet Generic drug:  nitroGLYCERIN Place 0.4 mg under the tongue every 5 (five) minutes as needed for chest pain.   OLANZapine 7.5 MG  tablet Commonly known as:  ZYPREXA TAKE 1 TABLET BY MOUTH DAILY What changed:    how much to take  how to take this  when to take this   omeprazole-sodium bicarbonate 40-1100 MG capsule Commonly known as:  ZEGERID Take 1 capsule by mouth daily before breakfast.   polyethylene glycol packet Commonly known as:  MIRALAX / GLYCOLAX Take 17 g by mouth daily.   senna 8.6 MG Tabs tablet Commonly known as:  SENOKOT Take 2 tablets (17.2 mg total) by mouth at bedtime.   torsemide 20 MG tablet Commonly known as:  DEMADEX Take 20 mg by mouth every other day.       Contact information for follow-up providers    Haddix, Thomasene Lot, MD. Schedule an appointment as soon as possible for a visit in 2 week(s).   Specialty:  Orthopedic Surgery Contact information: [address] [place] [address] [phone]        Unk Pinto, MD Follow up.   Specialty:  Internal Medicine Contact information: [address] [place] [address] [phone]            Contact information for after-discharge care    Destination    [facility] Follow up.   Service:  Skilled Nursing Contact information: [place] [phone]                 Allergies  Allergen Reactions  . Acetaminophen Other (See Comments)    LIVER INFLAMMATION IN HIGH DOSES  . Lasix [Furosemide] Other (See Comments)    Could not function / could not get out of bed.    Consultations: Dr. Doreatha Martin, Orthopedic surgery    Procedures/Studies: Dg Chest 1 View  Result Date: 04/22/2017 CLINICAL DATA:  Preop.  Congestion for 1 day.  Former smoker. EXAM: CHEST 1 VIEW COMPARISON:  01/18/2014 FINDINGS: The cardiac silhouette remains enlarged. Aortic atherosclerosis is noted. There are chronic scratched of there is chronic peribronchial thickening. No confluent airspace opacity, overt pulmonary edema, sizable pleural  effusion, or pneumothorax is identified. There are chronic degenerative changes about both shoulders with postoperative changes to the distal right clavicle. IMPRESSION: Chronic bronchitic changes. Electronically Signed   By: Logan Bores M.D.   On: 04/22/2017 16:00   Dg Lumbar Spine Complete  Result Date: 04/22/2017 CLINICAL DATA:  [place] last night, LEFT knee pain. History of lumbar spine surgery. EXAM: LUMBAR SPINE - COMPLETE 4+ VIEW COMPARISON:  Lumbar spine radiographs July 25, 2015 FINDINGS: Stable broad dextroscoliosis. No acute fracture deformity. No malalignment. Severe L1-2 disc height loss with endplate sclerosis in subsidence. Severe L2-3 and L3-4 similar disc height loss with vacuum disc, endplate sclerosis and marginal spurring. Severe lower lumbar facet arthropathy. No destructive bony lesions. Ankylosis of the sacroiliac joints. Prevertebral and paraspinal soft tissue planes are nonacute. 3.3 cm heavily calcified infrarenal aortic aneurysm. IMPRESSION: No acute fracture deformity or  malalignment. Stable marked endplate irregularity and subsidence L1-2 seen with old discitis osteomyelitis and/or severe degenerative disc. Stable overall degenerative change of the lumbar spine. 3.3 cm infrarenal aortic aneurysm for which follow-up abdominal ultrasound or CT angiogram of the abdomen is recommended. Aortic Atherosclerosis (ICD10-I70.0). Electronically Signed   By: Elon Alas M.D.   On: 04/22/2017 13:38   Ct Head Wo Contrast  Result Date: 04/22/2017 CLINICAL DATA:  Patient with neck pain.  Patient status post fall. EXAM: CT HEAD WITHOUT CONTRAST CT CERVICAL SPINE WITHOUT CONTRAST TECHNIQUE: Multidetector CT imaging of the head and cervical spine was performed following the standard protocol without intravenous contrast. Multiplanar CT image reconstructions of the cervical spine were also generated. COMPARISON:  Brain CT 07/12/2013. FINDINGS: CT HEAD FINDINGS Brain: Ventricles and sulci are  appropriate for patient's age. No evidence for acute cortically based infarct, intracranial hemorrhage, mass lesion or mass-effect. Vascular: Internal carotid arterial vascular calcifications. Skull: Intact. Sinuses/Orbits: Paranasal sinuses are well aerated. Mastoid air cells are unremarkable. Orbits are unremarkable. Other: None. CT CERVICAL SPINE FINDINGS Alignment: Grade 1 anterolisthesis of C3 on C4. Skull base and vertebrae: Intact. Soft tissues and spinal canal: No prevertebral fluid or swelling. No visible canal hematoma. Disc levels: Multilevel degenerative disc disease. Fusion at the C3-4 level. No acute fracture. Multilevel facet degenerative changes. Upper chest: Unremarkable. Other: None. IMPRESSION: No acute intracranial process. No acute cervical spine fracture. Multilevel degenerative disc disease. Osseous fusion C3-4. Electronically Signed   By: Lovey Newcomer M.D.   On: 04/22/2017 13:16   Ct Cervical Spine Wo Contrast  Result Date: 04/22/2017 CLINICAL DATA:  Patient with neck pain.  Patient status post fall. EXAM: CT HEAD WITHOUT CONTRAST CT CERVICAL SPINE WITHOUT CONTRAST TECHNIQUE: Multidetector CT imaging of the head and cervical spine was performed following the standard protocol without intravenous contrast. Multiplanar CT image reconstructions of the cervical spine were also generated. COMPARISON:  Brain CT 07/12/2013. FINDINGS: CT HEAD FINDINGS Brain: Ventricles and sulci are appropriate for patient's age. No evidence for acute cortically based infarct, intracranial hemorrhage, mass lesion or mass-effect. Vascular: Internal carotid arterial vascular calcifications. Skull: Intact. Sinuses/Orbits: Paranasal sinuses are well aerated. Mastoid air cells are unremarkable. Orbits are unremarkable. Other: None. CT CERVICAL SPINE FINDINGS Alignment: Grade 1 anterolisthesis of C3 on C4. Skull base and vertebrae: Intact. Soft tissues and spinal canal: No prevertebral fluid or swelling. No visible canal  hematoma. Disc levels: Multilevel degenerative disc disease. Fusion at the C3-4 level. No acute fracture. Multilevel facet degenerative changes. Upper chest: Unremarkable. Other: None. IMPRESSION: No acute intracranial process. No acute cervical spine fracture. Multilevel degenerative disc disease. Osseous fusion C3-4. Electronically Signed   By: Lovey Newcomer M.D.   On: 04/22/2017 13:16   Ct Hip Left Wo Contrast  Result Date: 04/22/2017 CLINICAL DATA:  Recent fall with left hip pain. Questionable left femur fracture radiographs. EXAM: CT OF THE LEFT HIP WITHOUT CONTRAST TECHNIQUE: Multidetector CT imaging of the left hip was performed according to the standard protocol. Multiplanar CT image reconstructions were also generated. COMPARISON:  Left hip radiographs 04/22/2017 and 06/26/2015. FINDINGS: Bones/Joint/Cartilage The mineralization is adequate. There is no evidence of acute fracture, dislocation or left femoral head avascular necrosis. Mild left hip degenerative changes are present without significant joint effusion. Mild degenerative changes are also present at the left sacroiliac joint and L5-S1 disc space. Ligaments Suboptimally assessed by CT. Muscles and Tendons There is a least partial tearing of the left common hamstring tendon with partial tendon retraction  into the posterior thigh. There is associated proximal left hamstring muscular fatty atrophy. There is some fatty atrophy within the left rectus femoris muscle as well. There is ossification along the insertion of the iliopsoas tendon. Soft tissues Iliac and femoral atherosclerosis. No evidence of periarticular hematoma. IMPRESSION: 1. No evidence of acute left hip fracture or dislocation. 2. No definite acute posttraumatic findings. 3. Partial tearing of the left common hamstring tendon with associated muscular atrophy. Electronically Signed   By: Richardean Sale M.D.   On: 04/22/2017 15:27   Dg Knee Complete 4 Views Left  Result Date:  04/22/2017 CLINICAL DATA:  Recent fall with left knee pain, initial encounter EXAM: LEFT KNEE - COMPLETE 4+ VIEW COMPARISON:  None. FINDINGS: There is a comminuted fracture identified in the distal left femoral metaphysis with lateral displacement of the distal fracture fragments as well as the femoral portion of the knee prosthesis. Some impaction is also noted at the fracture site. The proximal tibia and fibula appear within normal limits with the tibial prosthesis is intact. IMPRESSION: Comminuted distal metaphyseal fracture of the left femur as described. Electronically Signed   By: Inez Catalina M.D.   On: 04/22/2017 13:42   Dg C-arm 1-60 Min  Result Date: 04/23/2017 CLINICAL DATA:  ORIF of distal femoral fracture EXAM: DG C-ARM 61-120 MIN; LEFT FEMUR 2 VIEWS COMPARISON:  04/22/2017 FINDINGS: 1 minute 37 seconds of fluoroscopic time was utilized during fixation of an acute, valgus angulated supracondylar fracture of the left femur. Subsequent placement of lateral femoral plate and screw fixation hardware is noted with improved alignment demonstrated. Pre-existing total knee arthroplasty is maintained. IMPRESSION: New lateral plate and screw fixation of a supracondylar fracture of the left femur with improved alignment. Electronically Signed   By: Ashley Royalty M.D.   On: 04/23/2017 18:04   Dg C-arm 1-60 Min  Result Date: 04/23/2017 CLINICAL DATA:  ORIF of distal femoral fracture EXAM: DG C-ARM 61-120 MIN; LEFT FEMUR 2 VIEWS COMPARISON:  04/22/2017 FINDINGS: 1 minute 37 seconds of fluoroscopic time was utilized during fixation of an acute, valgus angulated supracondylar fracture of the left femur. Subsequent placement of lateral femoral plate and screw fixation hardware is noted with improved alignment demonstrated. Pre-existing total knee arthroplasty is maintained. IMPRESSION: New lateral plate and screw fixation of a supracondylar fracture of the left femur with improved alignment. Electronically Signed    By: Ashley Royalty M.D.   On: 04/23/2017 18:04   Dg Hip Unilat W Or Wo Pelvis 2-3 Views Left  Result Date: 04/22/2017 CLINICAL DATA:  Recent falls with left hip pain, initial encounter EXAM: DG HIP (WITH OR WITHOUT PELVIS) 2-3V LEFT COMPARISON:  06/26/2015 FINDINGS: Pelvic ring is well visualized and intact. No dislocation is seen. Degenerative changes of lumbar spine and hip joints are noted bilaterally. There is a vertical lucency through the intratrochanteric region seen only on the oblique image. The possibility of an undisplaced fracture could not be totally excluded. If clinical suspicion is high a CT of the pelvis may be helpful for further evaluation. IMPRESSION: Degenerative changes of lumbar spine and hip joints. Vertical lucency through the proximal femur as described only on the oblique image. If clinically indicated CT of the pelvis may be helpful for further evaluation. Electronically Signed   By: Inez Catalina M.D.   On: 04/22/2017 13:41   Dg Femur Min 2 Views Left  Result Date: 04/23/2017 CLINICAL DATA:  ORIF of distal femoral fracture EXAM: DG C-ARM 61-120 MIN; LEFT FEMUR  2 VIEWS COMPARISON:  04/22/2017 FINDINGS: 1 minute 37 seconds of fluoroscopic time was utilized during fixation of an acute, valgus angulated supracondylar fracture of the left femur. Subsequent placement of lateral femoral plate and screw fixation hardware is noted with improved alignment demonstrated. Pre-existing total knee arthroplasty is maintained. IMPRESSION: New lateral plate and screw fixation of a supracondylar fracture of the left femur with improved alignment. Electronically Signed   By: Ashley Royalty M.D.   On: 04/23/2017 18:04   Dg Femur Port Min 2 Views Left  Result Date: 04/23/2017 CLINICAL DATA:  History of recent ORIF of distal left femoral fracture. EXAM: LEFT FEMUR PORTABLE 2 VIEWS COMPARISON:  Intraoperative films from earlier in the same day. FINDINGS: Changes consistent with the recent distal left femoral  fracture are noted. Lateral fixation sideplate with multiple fixation screws is seen. The fracture fragments are in near anatomic alignment. No other focal abnormality is noted. IMPRESSION: Status post ORIF of distal left femoral fracture. Electronically Signed   By: Inez Catalina M.D.   On: 04/23/2017 20:20      Subjective: Feeling better today.  Breathing is a little better today.  Denies lightheadedness, dizziness.  Left leg is feeling much better/less painful.    Discharge Exam: Vitals:   04/26/17 0440 04/26/17 0741  BP: 109/68 (!) 111/53  Pulse: 76 67  Resp: 18 18  Temp: 98.3 F (36.8 C) 97.8 F (36.6 C)  SpO2: 95% 99%   Vitals:   04/25/17 1430 04/25/17 2107 04/26/17 0440 04/26/17 0741  BP: 112/62 (!) 90/55 109/68 (!) 111/53  Pulse: 74 64 76 67  Resp: 18 18 18 18   Temp: 97.7 F (36.5 C) 98.8 F (37.1 C) 98.3 F (36.8 C) 97.8 F (36.6 C)  TempSrc: Oral Oral Oral Oral  SpO2: 92% 92% 95% 99%  Weight:      Height:        General: Pt is alert, awake, not in acute distress Cardiovascular: sounds regular even though in a-fib, S1/S2 +, no rubs, no gallops Respiratory: CTA bilaterally, no wheezing, no rhonchi Abdominal: Soft, NT, ND, bowel sounds + Extremities:  Nonpitting edema bilateral lower extremities.  Left knee is swollen, dry dressing in place.  Sutures on left lateral leg are intact, edges well approximated, no surrounding erythema or induration.      The results of significant diagnostics from this hospitalization (including imaging, microbiology, ancillary and laboratory) are listed below for reference.     Microbiology: Recent Results (from the past 240 hour(s))  Surgical pcr screen     Status: None   Collection Time: 04/22/17 11:29 PM  Result Value Ref Range Status   MRSA, PCR NEGATIVE NEGATIVE Final   Staphylococcus aureus NEGATIVE NEGATIVE Final    Comment: (NOTE) The Xpert SA Assay (FDA approved for NASAL specimens in patients 38 years of age and  older), is one component of a comprehensive surveillance program. It is not intended to diagnose infection nor to guide or monitor treatment. Performed at [facility], [place] [address] [place], [place] [address]      Labs: BNP (last 3 results) No results for input(s): BNP in the last 8760 hours. Basic Metabolic Panel: Recent Labs  Lab 04/22/17 1204 04/23/17 0017 04/24/17 0439 04/25/17 0612 04/26/17 0359  NA 136 136 136 137 137  K 4.1 3.8 4.4 4.6 4.7  CL 103 100* 106 106 103  CO2 22 25 23 23 22   GLUCOSE 115* 137* 152* 104* 102*  BUN 26*  29* 25* 27* 32*  CREATININE 1.70* 2.01* 1.54* 1.39* 1.54*  CALCIUM 9.2 8.7* 7.8* 8.2* 8.4*   Liver Function Tests: No results for input(s): AST, ALT, ALKPHOS, BILITOT, PROT, ALBUMIN in the last 168 hours. No results for input(s): LIPASE, AMYLASE in the last 168 hours. No results for input(s): AMMONIA in the last 168 hours. CBC: Recent Labs  Lab 04/22/17 1204 04/23/17 0017 04/24/17 0439 04/25/17 0612 04/25/17 1753 04/26/17 0359  WBC 12.7* 11.0* 10.5 11.4*  --  11.7*  NEUTROABS 9.9*  --   --   --   --   --   HGB 10.0* 9.1* 8.0* 7.2* 8.5* 8.9*  HCT 31.9* 29.6* 26.4* 23.6* 28.1* 29.1*  MCV 85.5 85.8 87.1 87.7  --  87.7  PLT 220 208 188 194  --  253   Cardiac Enzymes: No results for input(s): CKTOTAL, CKMB, CKMBINDEX, TROPONINI in the last 168 hours. BNP: Invalid input(s): POCBNP CBG: No results for input(s): GLUCAP in the last 168 hours. D-Dimer No results for input(s): DDIMER in the last 72 hours. Hgb A1c No results for input(s): HGBA1C in the last 72 hours. Lipid Profile No results for input(s): CHOL, HDL, LDLCALC, TRIG, CHOLHDL, LDLDIRECT in the last 72 hours. Thyroid function studies No results for input(s): TSH, T4TOTAL, T3FREE, THYROIDAB in the last 72 hours.  Invalid input(s): FREET3 Anemia work up No results for input(s): VITAMINB12, FOLATE, FERRITIN, TIBC, IRON, RETICCTPCT in the last 72 hours. Urinalysis     Component Value Date/Time   COLORURINE YELLOW 06/28/2016 1011   APPEARANCEUR CLEAR 06/28/2016 1011   LABSPEC 1.007 06/28/2016 1011   PHURINE 6.5 06/28/2016 1011   GLUCOSEU NEGATIVE 06/28/2016 1011   HGBUR NEGATIVE 06/28/2016 1011   HGBUR negative 09/18/2007 0834   BILIRUBINUR NEGATIVE 06/28/2016 1011   BILIRUBINUR neg 04/11/2013 1836   KETONESUR NEGATIVE 06/28/2016 1011   PROTEINUR NEGATIVE 06/28/2016 1011   UROBILINOGEN 0.2 07/12/2013 0802   NITRITE NEGATIVE 06/28/2016 1011   LEUKOCYTESUR NEGATIVE 06/28/2016 1011   Sepsis Labs Invalid input(s): PROCALCITONIN,  WBC,  LACTICIDVEN   Time coordinating discharge: Over 30 minutes  SIGNED:   Janece Canterbury, MD  [facility] 04/26/2017, 9:40 AM Pager   If 7PM-7AM, please contact night-coverage [email or web address] Password [email or web address]

## 2017-04-26 NOTE — Progress Notes (Signed)
Pt to [facility] by [facility].  DC and instructions given.  Called [place], Dtr and let her know.

## 2017-04-26 NOTE — Progress Notes (Signed)
Talked with pt's daughter, Judeen Hammans and told her I would call her when pt goes to [facility].

## 2017-04-26 NOTE — Progress Notes (Signed)
Patient will DC to: [facility] Anticipated DC date: 04/26/17 Family notified: Daughter, Surveyor, mining by: Corey Harold 11am (may be behind)   Per MD patient ready for DC to [facility]. RN, patient, patient's family, and facility notified of DC. Discharge Summary sent to facility. RN given number for report [phone] Room 402). DC packet on chart. Ambulance transport requested for patient.   CSW signing off.  Cedric Fishman, LCSW Clinical Social Worker [phone]

## 2017-04-26 NOTE — Progress Notes (Signed)
Called and gave report to Bonnielee Haff, nurse at [facility] for DC to SNF.

## 2017-04-26 NOTE — Progress Notes (Signed)
Texted Dr. Sheran Fava about pt allergy to acetaminophen and on Vicodin.

## 2017-04-27 DIAGNOSIS — E559 Vitamin D deficiency, unspecified: Secondary | ICD-10-CM | POA: Diagnosis not present

## 2017-04-27 DIAGNOSIS — I10 Essential (primary) hypertension: Secondary | ICD-10-CM | POA: Diagnosis not present

## 2017-04-27 DIAGNOSIS — S7290XA Unspecified fracture of unspecified femur, initial encounter for closed fracture: Secondary | ICD-10-CM | POA: Diagnosis not present

## 2017-04-27 DIAGNOSIS — G4733 Obstructive sleep apnea (adult) (pediatric): Secondary | ICD-10-CM | POA: Diagnosis not present

## 2017-04-27 DIAGNOSIS — I4891 Unspecified atrial fibrillation: Secondary | ICD-10-CM | POA: Diagnosis not present

## 2017-04-29 NOTE — Consult Note (Signed)
            [facility] CM Primary Care Navigator  04/29/2017  [name] October 08, 1940 [id number]   Attempt to seepatient at the bedsideto identify possible discharge needs but he was alreadydischarged to skilled nursing facility (SNF) for rehabilitation as per therapy recommendation.  PerMD note,patient presented with left hip pain status post mechanical fall and found to have a left distal periprosthetic femur fracture. He underwent open reduction and internal fixation (ORIF) of his left periprosthetic distal femur fracture on 3/6.  Patient has discharge instruction to follow-up with primary care provider post discharge.   For additional questions please contact:  Edwena Felty A. Khyrin Trevathan, BSN, RN-BC [facility] PRIMARY CARE Navigator Cell: [phone]

## 2017-05-06 DIAGNOSIS — S72452A Displaced supracondylar fracture without intracondylar extension of lower end of left femur, initial encounter for closed fracture: Secondary | ICD-10-CM | POA: Diagnosis not present

## 2017-05-06 DIAGNOSIS — S72452D Displaced supracondylar fracture without intracondylar extension of lower end of left femur, subsequent encounter for closed fracture with routine healing: Secondary | ICD-10-CM | POA: Diagnosis not present

## 2017-05-13 ENCOUNTER — Ambulatory Visit: Payer: Self-pay | Admitting: Adult Health

## 2017-05-18 DIAGNOSIS — N390 Urinary tract infection, site not specified: Secondary | ICD-10-CM | POA: Diagnosis not present

## 2017-05-18 DIAGNOSIS — E860 Dehydration: Secondary | ICD-10-CM | POA: Diagnosis not present

## 2017-05-21 DIAGNOSIS — R05 Cough: Secondary | ICD-10-CM | POA: Diagnosis not present

## 2017-05-28 DIAGNOSIS — M6389 Disorders of muscle in diseases classified elsewhere, multiple sites: Secondary | ICD-10-CM | POA: Diagnosis not present

## 2017-05-28 DIAGNOSIS — K219 Gastro-esophageal reflux disease without esophagitis: Secondary | ICD-10-CM | POA: Diagnosis not present

## 2017-05-28 DIAGNOSIS — R1310 Dysphagia, unspecified: Secondary | ICD-10-CM | POA: Diagnosis not present

## 2017-05-28 DIAGNOSIS — R278 Other lack of coordination: Secondary | ICD-10-CM | POA: Diagnosis not present

## 2017-05-28 DIAGNOSIS — R1312 Dysphagia, oropharyngeal phase: Secondary | ICD-10-CM | POA: Diagnosis not present

## 2017-05-28 DIAGNOSIS — I2510 Atherosclerotic heart disease of native coronary artery without angina pectoris: Secondary | ICD-10-CM | POA: Diagnosis not present

## 2017-05-28 DIAGNOSIS — R2681 Unsteadiness on feet: Secondary | ICD-10-CM | POA: Diagnosis not present

## 2017-05-29 DIAGNOSIS — I2510 Atherosclerotic heart disease of native coronary artery without angina pectoris: Secondary | ICD-10-CM | POA: Diagnosis not present

## 2017-05-29 DIAGNOSIS — R278 Other lack of coordination: Secondary | ICD-10-CM | POA: Diagnosis not present

## 2017-05-29 DIAGNOSIS — K219 Gastro-esophageal reflux disease without esophagitis: Secondary | ICD-10-CM | POA: Diagnosis not present

## 2017-05-29 DIAGNOSIS — R1310 Dysphagia, unspecified: Secondary | ICD-10-CM | POA: Diagnosis not present

## 2017-05-29 DIAGNOSIS — M6389 Disorders of muscle in diseases classified elsewhere, multiple sites: Secondary | ICD-10-CM | POA: Diagnosis not present

## 2017-05-29 DIAGNOSIS — R1312 Dysphagia, oropharyngeal phase: Secondary | ICD-10-CM | POA: Diagnosis not present

## 2017-05-29 DIAGNOSIS — R2681 Unsteadiness on feet: Secondary | ICD-10-CM | POA: Diagnosis not present

## 2017-05-30 DIAGNOSIS — R1312 Dysphagia, oropharyngeal phase: Secondary | ICD-10-CM | POA: Diagnosis not present

## 2017-05-30 DIAGNOSIS — K219 Gastro-esophageal reflux disease without esophagitis: Secondary | ICD-10-CM | POA: Diagnosis not present

## 2017-05-30 DIAGNOSIS — M6389 Disorders of muscle in diseases classified elsewhere, multiple sites: Secondary | ICD-10-CM | POA: Diagnosis not present

## 2017-05-30 DIAGNOSIS — R278 Other lack of coordination: Secondary | ICD-10-CM | POA: Diagnosis not present

## 2017-05-30 DIAGNOSIS — I2510 Atherosclerotic heart disease of native coronary artery without angina pectoris: Secondary | ICD-10-CM | POA: Diagnosis not present

## 2017-05-30 DIAGNOSIS — R2681 Unsteadiness on feet: Secondary | ICD-10-CM | POA: Diagnosis not present

## 2017-05-30 DIAGNOSIS — R1310 Dysphagia, unspecified: Secondary | ICD-10-CM | POA: Diagnosis not present

## 2017-06-01 DIAGNOSIS — E039 Hypothyroidism, unspecified: Secondary | ICD-10-CM | POA: Diagnosis not present

## 2017-06-02 DIAGNOSIS — R1312 Dysphagia, oropharyngeal phase: Secondary | ICD-10-CM | POA: Diagnosis not present

## 2017-06-02 DIAGNOSIS — K219 Gastro-esophageal reflux disease without esophagitis: Secondary | ICD-10-CM | POA: Diagnosis not present

## 2017-06-02 DIAGNOSIS — I2510 Atherosclerotic heart disease of native coronary artery without angina pectoris: Secondary | ICD-10-CM | POA: Diagnosis not present

## 2017-06-02 DIAGNOSIS — R2681 Unsteadiness on feet: Secondary | ICD-10-CM | POA: Diagnosis not present

## 2017-06-02 DIAGNOSIS — R278 Other lack of coordination: Secondary | ICD-10-CM | POA: Diagnosis not present

## 2017-06-02 DIAGNOSIS — R1310 Dysphagia, unspecified: Secondary | ICD-10-CM | POA: Diagnosis not present

## 2017-06-02 DIAGNOSIS — M6389 Disorders of muscle in diseases classified elsewhere, multiple sites: Secondary | ICD-10-CM | POA: Diagnosis not present

## 2017-06-03 DIAGNOSIS — R278 Other lack of coordination: Secondary | ICD-10-CM | POA: Diagnosis not present

## 2017-06-03 DIAGNOSIS — R2681 Unsteadiness on feet: Secondary | ICD-10-CM | POA: Diagnosis not present

## 2017-06-03 DIAGNOSIS — K219 Gastro-esophageal reflux disease without esophagitis: Secondary | ICD-10-CM | POA: Diagnosis not present

## 2017-06-03 DIAGNOSIS — M6389 Disorders of muscle in diseases classified elsewhere, multiple sites: Secondary | ICD-10-CM | POA: Diagnosis not present

## 2017-06-03 DIAGNOSIS — R1310 Dysphagia, unspecified: Secondary | ICD-10-CM | POA: Diagnosis not present

## 2017-06-03 DIAGNOSIS — I2510 Atherosclerotic heart disease of native coronary artery without angina pectoris: Secondary | ICD-10-CM | POA: Diagnosis not present

## 2017-06-03 DIAGNOSIS — R1312 Dysphagia, oropharyngeal phase: Secondary | ICD-10-CM | POA: Diagnosis not present

## 2017-06-03 DIAGNOSIS — L89612 Pressure ulcer of right heel, stage 2: Secondary | ICD-10-CM | POA: Diagnosis not present

## 2017-06-04 DIAGNOSIS — R2681 Unsteadiness on feet: Secondary | ICD-10-CM | POA: Diagnosis not present

## 2017-06-04 DIAGNOSIS — K219 Gastro-esophageal reflux disease without esophagitis: Secondary | ICD-10-CM | POA: Diagnosis not present

## 2017-06-04 DIAGNOSIS — R278 Other lack of coordination: Secondary | ICD-10-CM | POA: Diagnosis not present

## 2017-06-04 DIAGNOSIS — Z79899 Other long term (current) drug therapy: Secondary | ICD-10-CM | POA: Diagnosis not present

## 2017-06-04 DIAGNOSIS — R1312 Dysphagia, oropharyngeal phase: Secondary | ICD-10-CM | POA: Diagnosis not present

## 2017-06-04 DIAGNOSIS — R1310 Dysphagia, unspecified: Secondary | ICD-10-CM | POA: Diagnosis not present

## 2017-06-04 DIAGNOSIS — I2510 Atherosclerotic heart disease of native coronary artery without angina pectoris: Secondary | ICD-10-CM | POA: Diagnosis not present

## 2017-06-04 DIAGNOSIS — M6389 Disorders of muscle in diseases classified elsewhere, multiple sites: Secondary | ICD-10-CM | POA: Diagnosis not present

## 2017-06-05 DIAGNOSIS — I2510 Atherosclerotic heart disease of native coronary artery without angina pectoris: Secondary | ICD-10-CM | POA: Diagnosis not present

## 2017-06-05 DIAGNOSIS — R2681 Unsteadiness on feet: Secondary | ICD-10-CM | POA: Diagnosis not present

## 2017-06-05 DIAGNOSIS — R1312 Dysphagia, oropharyngeal phase: Secondary | ICD-10-CM | POA: Diagnosis not present

## 2017-06-05 DIAGNOSIS — M6389 Disorders of muscle in diseases classified elsewhere, multiple sites: Secondary | ICD-10-CM | POA: Diagnosis not present

## 2017-06-05 DIAGNOSIS — K219 Gastro-esophageal reflux disease without esophagitis: Secondary | ICD-10-CM | POA: Diagnosis not present

## 2017-06-05 DIAGNOSIS — R1310 Dysphagia, unspecified: Secondary | ICD-10-CM | POA: Diagnosis not present

## 2017-06-05 DIAGNOSIS — R278 Other lack of coordination: Secondary | ICD-10-CM | POA: Diagnosis not present

## 2017-06-06 DIAGNOSIS — R2681 Unsteadiness on feet: Secondary | ICD-10-CM | POA: Diagnosis not present

## 2017-06-06 DIAGNOSIS — R278 Other lack of coordination: Secondary | ICD-10-CM | POA: Diagnosis not present

## 2017-06-06 DIAGNOSIS — R1310 Dysphagia, unspecified: Secondary | ICD-10-CM | POA: Diagnosis not present

## 2017-06-06 DIAGNOSIS — R1312 Dysphagia, oropharyngeal phase: Secondary | ICD-10-CM | POA: Diagnosis not present

## 2017-06-06 DIAGNOSIS — I2510 Atherosclerotic heart disease of native coronary artery without angina pectoris: Secondary | ICD-10-CM | POA: Diagnosis not present

## 2017-06-06 DIAGNOSIS — K219 Gastro-esophageal reflux disease without esophagitis: Secondary | ICD-10-CM | POA: Diagnosis not present

## 2017-06-06 DIAGNOSIS — M6389 Disorders of muscle in diseases classified elsewhere, multiple sites: Secondary | ICD-10-CM | POA: Diagnosis not present

## 2017-06-09 DIAGNOSIS — K219 Gastro-esophageal reflux disease without esophagitis: Secondary | ICD-10-CM | POA: Diagnosis not present

## 2017-06-09 DIAGNOSIS — I2510 Atherosclerotic heart disease of native coronary artery without angina pectoris: Secondary | ICD-10-CM | POA: Diagnosis not present

## 2017-06-09 DIAGNOSIS — R278 Other lack of coordination: Secondary | ICD-10-CM | POA: Diagnosis not present

## 2017-06-09 DIAGNOSIS — R1310 Dysphagia, unspecified: Secondary | ICD-10-CM | POA: Diagnosis not present

## 2017-06-09 DIAGNOSIS — S72452D Displaced supracondylar fracture without intracondylar extension of lower end of left femur, subsequent encounter for closed fracture with routine healing: Secondary | ICD-10-CM | POA: Diagnosis not present

## 2017-06-09 DIAGNOSIS — R1312 Dysphagia, oropharyngeal phase: Secondary | ICD-10-CM | POA: Diagnosis not present

## 2017-06-09 DIAGNOSIS — M6389 Disorders of muscle in diseases classified elsewhere, multiple sites: Secondary | ICD-10-CM | POA: Diagnosis not present

## 2017-06-09 DIAGNOSIS — R2681 Unsteadiness on feet: Secondary | ICD-10-CM | POA: Diagnosis not present

## 2017-06-10 DIAGNOSIS — M6389 Disorders of muscle in diseases classified elsewhere, multiple sites: Secondary | ICD-10-CM | POA: Diagnosis not present

## 2017-06-10 DIAGNOSIS — R278 Other lack of coordination: Secondary | ICD-10-CM | POA: Diagnosis not present

## 2017-06-10 DIAGNOSIS — R2681 Unsteadiness on feet: Secondary | ICD-10-CM | POA: Diagnosis not present

## 2017-06-10 DIAGNOSIS — R1312 Dysphagia, oropharyngeal phase: Secondary | ICD-10-CM | POA: Diagnosis not present

## 2017-06-10 DIAGNOSIS — R1310 Dysphagia, unspecified: Secondary | ICD-10-CM | POA: Diagnosis not present

## 2017-06-10 DIAGNOSIS — K219 Gastro-esophageal reflux disease without esophagitis: Secondary | ICD-10-CM | POA: Diagnosis not present

## 2017-06-10 DIAGNOSIS — I2510 Atherosclerotic heart disease of native coronary artery without angina pectoris: Secondary | ICD-10-CM | POA: Diagnosis not present

## 2017-06-10 DIAGNOSIS — L89612 Pressure ulcer of right heel, stage 2: Secondary | ICD-10-CM | POA: Diagnosis not present

## 2017-06-11 DIAGNOSIS — R2681 Unsteadiness on feet: Secondary | ICD-10-CM | POA: Diagnosis not present

## 2017-06-11 DIAGNOSIS — I2510 Atherosclerotic heart disease of native coronary artery without angina pectoris: Secondary | ICD-10-CM | POA: Diagnosis not present

## 2017-06-11 DIAGNOSIS — R278 Other lack of coordination: Secondary | ICD-10-CM | POA: Diagnosis not present

## 2017-06-11 DIAGNOSIS — M6389 Disorders of muscle in diseases classified elsewhere, multiple sites: Secondary | ICD-10-CM | POA: Diagnosis not present

## 2017-06-11 DIAGNOSIS — R1310 Dysphagia, unspecified: Secondary | ICD-10-CM | POA: Diagnosis not present

## 2017-06-11 DIAGNOSIS — R1312 Dysphagia, oropharyngeal phase: Secondary | ICD-10-CM | POA: Diagnosis not present

## 2017-06-11 DIAGNOSIS — K219 Gastro-esophageal reflux disease without esophagitis: Secondary | ICD-10-CM | POA: Diagnosis not present

## 2017-06-12 DIAGNOSIS — R2681 Unsteadiness on feet: Secondary | ICD-10-CM | POA: Diagnosis not present

## 2017-06-12 DIAGNOSIS — R278 Other lack of coordination: Secondary | ICD-10-CM | POA: Diagnosis not present

## 2017-06-12 DIAGNOSIS — M6389 Disorders of muscle in diseases classified elsewhere, multiple sites: Secondary | ICD-10-CM | POA: Diagnosis not present

## 2017-06-12 DIAGNOSIS — R1312 Dysphagia, oropharyngeal phase: Secondary | ICD-10-CM | POA: Diagnosis not present

## 2017-06-12 DIAGNOSIS — R1310 Dysphagia, unspecified: Secondary | ICD-10-CM | POA: Diagnosis not present

## 2017-06-12 DIAGNOSIS — K219 Gastro-esophageal reflux disease without esophagitis: Secondary | ICD-10-CM | POA: Diagnosis not present

## 2017-06-12 DIAGNOSIS — I2510 Atherosclerotic heart disease of native coronary artery without angina pectoris: Secondary | ICD-10-CM | POA: Diagnosis not present

## 2017-06-13 DIAGNOSIS — M6389 Disorders of muscle in diseases classified elsewhere, multiple sites: Secondary | ICD-10-CM | POA: Diagnosis not present

## 2017-06-13 DIAGNOSIS — K219 Gastro-esophageal reflux disease without esophagitis: Secondary | ICD-10-CM | POA: Diagnosis not present

## 2017-06-13 DIAGNOSIS — R2681 Unsteadiness on feet: Secondary | ICD-10-CM | POA: Diagnosis not present

## 2017-06-13 DIAGNOSIS — R278 Other lack of coordination: Secondary | ICD-10-CM | POA: Diagnosis not present

## 2017-06-13 DIAGNOSIS — R1312 Dysphagia, oropharyngeal phase: Secondary | ICD-10-CM | POA: Diagnosis not present

## 2017-06-13 DIAGNOSIS — I2510 Atherosclerotic heart disease of native coronary artery without angina pectoris: Secondary | ICD-10-CM | POA: Diagnosis not present

## 2017-06-13 DIAGNOSIS — R1310 Dysphagia, unspecified: Secondary | ICD-10-CM | POA: Diagnosis not present

## 2017-06-16 DIAGNOSIS — M6389 Disorders of muscle in diseases classified elsewhere, multiple sites: Secondary | ICD-10-CM | POA: Diagnosis not present

## 2017-06-16 DIAGNOSIS — I2510 Atherosclerotic heart disease of native coronary artery without angina pectoris: Secondary | ICD-10-CM | POA: Diagnosis not present

## 2017-06-16 DIAGNOSIS — R1310 Dysphagia, unspecified: Secondary | ICD-10-CM | POA: Diagnosis not present

## 2017-06-16 DIAGNOSIS — K219 Gastro-esophageal reflux disease without esophagitis: Secondary | ICD-10-CM | POA: Diagnosis not present

## 2017-06-16 DIAGNOSIS — R278 Other lack of coordination: Secondary | ICD-10-CM | POA: Diagnosis not present

## 2017-06-16 DIAGNOSIS — R2681 Unsteadiness on feet: Secondary | ICD-10-CM | POA: Diagnosis not present

## 2017-06-16 DIAGNOSIS — R1312 Dysphagia, oropharyngeal phase: Secondary | ICD-10-CM | POA: Diagnosis not present

## 2017-06-17 DIAGNOSIS — L89612 Pressure ulcer of right heel, stage 2: Secondary | ICD-10-CM | POA: Diagnosis not present

## 2017-06-17 DIAGNOSIS — R278 Other lack of coordination: Secondary | ICD-10-CM | POA: Diagnosis not present

## 2017-06-17 DIAGNOSIS — K219 Gastro-esophageal reflux disease without esophagitis: Secondary | ICD-10-CM | POA: Diagnosis not present

## 2017-06-17 DIAGNOSIS — R1310 Dysphagia, unspecified: Secondary | ICD-10-CM | POA: Diagnosis not present

## 2017-06-17 DIAGNOSIS — R2681 Unsteadiness on feet: Secondary | ICD-10-CM | POA: Diagnosis not present

## 2017-06-17 DIAGNOSIS — I2510 Atherosclerotic heart disease of native coronary artery without angina pectoris: Secondary | ICD-10-CM | POA: Diagnosis not present

## 2017-06-17 DIAGNOSIS — R1312 Dysphagia, oropharyngeal phase: Secondary | ICD-10-CM | POA: Diagnosis not present

## 2017-06-17 DIAGNOSIS — M6389 Disorders of muscle in diseases classified elsewhere, multiple sites: Secondary | ICD-10-CM | POA: Diagnosis not present

## 2017-06-18 DIAGNOSIS — S72452D Displaced supracondylar fracture without intracondylar extension of lower end of left femur, subsequent encounter for closed fracture with routine healing: Secondary | ICD-10-CM | POA: Diagnosis not present

## 2017-06-18 DIAGNOSIS — I2510 Atherosclerotic heart disease of native coronary artery without angina pectoris: Secondary | ICD-10-CM | POA: Diagnosis not present

## 2017-06-18 DIAGNOSIS — I739 Peripheral vascular disease, unspecified: Secondary | ICD-10-CM | POA: Diagnosis not present

## 2017-06-18 DIAGNOSIS — K219 Gastro-esophageal reflux disease without esophagitis: Secondary | ICD-10-CM | POA: Diagnosis not present

## 2017-06-18 DIAGNOSIS — R1312 Dysphagia, oropharyngeal phase: Secondary | ICD-10-CM | POA: Diagnosis not present

## 2017-06-18 DIAGNOSIS — R278 Other lack of coordination: Secondary | ICD-10-CM | POA: Diagnosis not present

## 2017-06-18 DIAGNOSIS — R2681 Unsteadiness on feet: Secondary | ICD-10-CM | POA: Diagnosis not present

## 2017-06-18 DIAGNOSIS — B351 Tinea unguium: Secondary | ICD-10-CM | POA: Diagnosis not present

## 2017-06-18 DIAGNOSIS — M6389 Disorders of muscle in diseases classified elsewhere, multiple sites: Secondary | ICD-10-CM | POA: Diagnosis not present

## 2017-06-19 DIAGNOSIS — M6389 Disorders of muscle in diseases classified elsewhere, multiple sites: Secondary | ICD-10-CM | POA: Diagnosis not present

## 2017-06-19 DIAGNOSIS — R05 Cough: Secondary | ICD-10-CM | POA: Diagnosis not present

## 2017-06-19 DIAGNOSIS — G6181 Chronic inflammatory demyelinating polyneuritis: Secondary | ICD-10-CM | POA: Diagnosis not present

## 2017-06-19 DIAGNOSIS — S72452D Displaced supracondylar fracture without intracondylar extension of lower end of left femur, subsequent encounter for closed fracture with routine healing: Secondary | ICD-10-CM | POA: Diagnosis not present

## 2017-06-19 DIAGNOSIS — R1312 Dysphagia, oropharyngeal phase: Secondary | ICD-10-CM | POA: Diagnosis not present

## 2017-06-19 DIAGNOSIS — R0602 Shortness of breath: Secondary | ICD-10-CM | POA: Diagnosis not present

## 2017-06-19 DIAGNOSIS — R2681 Unsteadiness on feet: Secondary | ICD-10-CM | POA: Diagnosis not present

## 2017-06-19 DIAGNOSIS — D649 Anemia, unspecified: Secondary | ICD-10-CM | POA: Diagnosis not present

## 2017-06-19 DIAGNOSIS — R278 Other lack of coordination: Secondary | ICD-10-CM | POA: Diagnosis not present

## 2017-06-19 DIAGNOSIS — K219 Gastro-esophageal reflux disease without esophagitis: Secondary | ICD-10-CM | POA: Diagnosis not present

## 2017-06-19 DIAGNOSIS — I2510 Atherosclerotic heart disease of native coronary artery without angina pectoris: Secondary | ICD-10-CM | POA: Diagnosis not present

## 2017-06-20 DIAGNOSIS — R2681 Unsteadiness on feet: Secondary | ICD-10-CM | POA: Diagnosis not present

## 2017-06-20 DIAGNOSIS — M6389 Disorders of muscle in diseases classified elsewhere, multiple sites: Secondary | ICD-10-CM | POA: Diagnosis not present

## 2017-06-20 DIAGNOSIS — R278 Other lack of coordination: Secondary | ICD-10-CM | POA: Diagnosis not present

## 2017-06-20 DIAGNOSIS — S72452D Displaced supracondylar fracture without intracondylar extension of lower end of left femur, subsequent encounter for closed fracture with routine healing: Secondary | ICD-10-CM | POA: Diagnosis not present

## 2017-06-20 DIAGNOSIS — R1312 Dysphagia, oropharyngeal phase: Secondary | ICD-10-CM | POA: Diagnosis not present

## 2017-06-20 DIAGNOSIS — K219 Gastro-esophageal reflux disease without esophagitis: Secondary | ICD-10-CM | POA: Diagnosis not present

## 2017-06-20 DIAGNOSIS — I2510 Atherosclerotic heart disease of native coronary artery without angina pectoris: Secondary | ICD-10-CM | POA: Diagnosis not present

## 2017-06-22 DIAGNOSIS — R0989 Other specified symptoms and signs involving the circulatory and respiratory systems: Secondary | ICD-10-CM | POA: Diagnosis not present

## 2017-06-23 DIAGNOSIS — D649 Anemia, unspecified: Secondary | ICD-10-CM | POA: Diagnosis not present

## 2017-06-23 DIAGNOSIS — I2510 Atherosclerotic heart disease of native coronary artery without angina pectoris: Secondary | ICD-10-CM | POA: Diagnosis not present

## 2017-06-23 DIAGNOSIS — R0602 Shortness of breath: Secondary | ICD-10-CM | POA: Diagnosis not present

## 2017-06-23 DIAGNOSIS — M6389 Disorders of muscle in diseases classified elsewhere, multiple sites: Secondary | ICD-10-CM | POA: Diagnosis not present

## 2017-06-23 DIAGNOSIS — R278 Other lack of coordination: Secondary | ICD-10-CM | POA: Diagnosis not present

## 2017-06-23 DIAGNOSIS — K219 Gastro-esophageal reflux disease without esophagitis: Secondary | ICD-10-CM | POA: Diagnosis not present

## 2017-06-23 DIAGNOSIS — R2681 Unsteadiness on feet: Secondary | ICD-10-CM | POA: Diagnosis not present

## 2017-06-23 DIAGNOSIS — R05 Cough: Secondary | ICD-10-CM | POA: Diagnosis not present

## 2017-06-23 DIAGNOSIS — E559 Vitamin D deficiency, unspecified: Secondary | ICD-10-CM | POA: Diagnosis not present

## 2017-06-23 DIAGNOSIS — I4891 Unspecified atrial fibrillation: Secondary | ICD-10-CM | POA: Diagnosis not present

## 2017-06-23 DIAGNOSIS — R1312 Dysphagia, oropharyngeal phase: Secondary | ICD-10-CM | POA: Diagnosis not present

## 2017-06-23 DIAGNOSIS — G6181 Chronic inflammatory demyelinating polyneuritis: Secondary | ICD-10-CM | POA: Diagnosis not present

## 2017-06-23 DIAGNOSIS — S72452D Displaced supracondylar fracture without intracondylar extension of lower end of left femur, subsequent encounter for closed fracture with routine healing: Secondary | ICD-10-CM | POA: Diagnosis not present

## 2017-06-24 DIAGNOSIS — M6389 Disorders of muscle in diseases classified elsewhere, multiple sites: Secondary | ICD-10-CM | POA: Diagnosis not present

## 2017-06-24 DIAGNOSIS — S72452D Displaced supracondylar fracture without intracondylar extension of lower end of left femur, subsequent encounter for closed fracture with routine healing: Secondary | ICD-10-CM | POA: Diagnosis not present

## 2017-06-24 DIAGNOSIS — R1312 Dysphagia, oropharyngeal phase: Secondary | ICD-10-CM | POA: Diagnosis not present

## 2017-06-24 DIAGNOSIS — R2681 Unsteadiness on feet: Secondary | ICD-10-CM | POA: Diagnosis not present

## 2017-06-24 DIAGNOSIS — R278 Other lack of coordination: Secondary | ICD-10-CM | POA: Diagnosis not present

## 2017-06-24 DIAGNOSIS — K219 Gastro-esophageal reflux disease without esophagitis: Secondary | ICD-10-CM | POA: Diagnosis not present

## 2017-06-24 DIAGNOSIS — I2510 Atherosclerotic heart disease of native coronary artery without angina pectoris: Secondary | ICD-10-CM | POA: Diagnosis not present

## 2017-06-25 DIAGNOSIS — I2510 Atherosclerotic heart disease of native coronary artery without angina pectoris: Secondary | ICD-10-CM | POA: Diagnosis not present

## 2017-06-25 DIAGNOSIS — I509 Heart failure, unspecified: Secondary | ICD-10-CM | POA: Diagnosis not present

## 2017-06-25 DIAGNOSIS — K219 Gastro-esophageal reflux disease without esophagitis: Secondary | ICD-10-CM | POA: Diagnosis not present

## 2017-06-25 DIAGNOSIS — R278 Other lack of coordination: Secondary | ICD-10-CM | POA: Diagnosis not present

## 2017-06-25 DIAGNOSIS — R2681 Unsteadiness on feet: Secondary | ICD-10-CM | POA: Diagnosis not present

## 2017-06-25 DIAGNOSIS — S72452D Displaced supracondylar fracture without intracondylar extension of lower end of left femur, subsequent encounter for closed fracture with routine healing: Secondary | ICD-10-CM | POA: Diagnosis not present

## 2017-06-25 DIAGNOSIS — M6389 Disorders of muscle in diseases classified elsewhere, multiple sites: Secondary | ICD-10-CM | POA: Diagnosis not present

## 2017-06-25 DIAGNOSIS — R1312 Dysphagia, oropharyngeal phase: Secondary | ICD-10-CM | POA: Diagnosis not present

## 2017-06-26 DIAGNOSIS — R1312 Dysphagia, oropharyngeal phase: Secondary | ICD-10-CM | POA: Diagnosis not present

## 2017-06-26 DIAGNOSIS — R278 Other lack of coordination: Secondary | ICD-10-CM | POA: Diagnosis not present

## 2017-06-26 DIAGNOSIS — M6389 Disorders of muscle in diseases classified elsewhere, multiple sites: Secondary | ICD-10-CM | POA: Diagnosis not present

## 2017-06-26 DIAGNOSIS — R2681 Unsteadiness on feet: Secondary | ICD-10-CM | POA: Diagnosis not present

## 2017-06-26 DIAGNOSIS — S72452D Displaced supracondylar fracture without intracondylar extension of lower end of left femur, subsequent encounter for closed fracture with routine healing: Secondary | ICD-10-CM | POA: Diagnosis not present

## 2017-06-26 DIAGNOSIS — K219 Gastro-esophageal reflux disease without esophagitis: Secondary | ICD-10-CM | POA: Diagnosis not present

## 2017-06-26 DIAGNOSIS — I2510 Atherosclerotic heart disease of native coronary artery without angina pectoris: Secondary | ICD-10-CM | POA: Diagnosis not present

## 2017-06-27 DIAGNOSIS — R2681 Unsteadiness on feet: Secondary | ICD-10-CM | POA: Diagnosis not present

## 2017-06-27 DIAGNOSIS — K219 Gastro-esophageal reflux disease without esophagitis: Secondary | ICD-10-CM | POA: Diagnosis not present

## 2017-06-27 DIAGNOSIS — R278 Other lack of coordination: Secondary | ICD-10-CM | POA: Diagnosis not present

## 2017-06-27 DIAGNOSIS — S72452D Displaced supracondylar fracture without intracondylar extension of lower end of left femur, subsequent encounter for closed fracture with routine healing: Secondary | ICD-10-CM | POA: Diagnosis not present

## 2017-06-27 DIAGNOSIS — M6389 Disorders of muscle in diseases classified elsewhere, multiple sites: Secondary | ICD-10-CM | POA: Diagnosis not present

## 2017-06-27 DIAGNOSIS — I2510 Atherosclerotic heart disease of native coronary artery without angina pectoris: Secondary | ICD-10-CM | POA: Diagnosis not present

## 2017-06-27 DIAGNOSIS — R1312 Dysphagia, oropharyngeal phase: Secondary | ICD-10-CM | POA: Diagnosis not present

## 2017-06-30 DIAGNOSIS — Z79899 Other long term (current) drug therapy: Secondary | ICD-10-CM | POA: Diagnosis not present

## 2017-06-30 DIAGNOSIS — D649 Anemia, unspecified: Secondary | ICD-10-CM | POA: Diagnosis not present

## 2017-06-30 DIAGNOSIS — R2681 Unsteadiness on feet: Secondary | ICD-10-CM | POA: Diagnosis not present

## 2017-06-30 DIAGNOSIS — K219 Gastro-esophageal reflux disease without esophagitis: Secondary | ICD-10-CM | POA: Diagnosis not present

## 2017-06-30 DIAGNOSIS — I509 Heart failure, unspecified: Secondary | ICD-10-CM | POA: Diagnosis not present

## 2017-06-30 DIAGNOSIS — R7989 Other specified abnormal findings of blood chemistry: Secondary | ICD-10-CM | POA: Diagnosis not present

## 2017-06-30 DIAGNOSIS — M6389 Disorders of muscle in diseases classified elsewhere, multiple sites: Secondary | ICD-10-CM | POA: Diagnosis not present

## 2017-06-30 DIAGNOSIS — R0602 Shortness of breath: Secondary | ICD-10-CM | POA: Diagnosis not present

## 2017-06-30 DIAGNOSIS — I2510 Atherosclerotic heart disease of native coronary artery without angina pectoris: Secondary | ICD-10-CM | POA: Diagnosis not present

## 2017-06-30 DIAGNOSIS — R1312 Dysphagia, oropharyngeal phase: Secondary | ICD-10-CM | POA: Diagnosis not present

## 2017-06-30 DIAGNOSIS — S72452D Displaced supracondylar fracture without intracondylar extension of lower end of left femur, subsequent encounter for closed fracture with routine healing: Secondary | ICD-10-CM | POA: Diagnosis not present

## 2017-06-30 DIAGNOSIS — R278 Other lack of coordination: Secondary | ICD-10-CM | POA: Diagnosis not present

## 2017-07-01 DIAGNOSIS — S72452D Displaced supracondylar fracture without intracondylar extension of lower end of left femur, subsequent encounter for closed fracture with routine healing: Secondary | ICD-10-CM | POA: Diagnosis not present

## 2017-07-01 DIAGNOSIS — K219 Gastro-esophageal reflux disease without esophagitis: Secondary | ICD-10-CM | POA: Diagnosis not present

## 2017-07-01 DIAGNOSIS — I2510 Atherosclerotic heart disease of native coronary artery without angina pectoris: Secondary | ICD-10-CM | POA: Diagnosis not present

## 2017-07-01 DIAGNOSIS — R278 Other lack of coordination: Secondary | ICD-10-CM | POA: Diagnosis not present

## 2017-07-01 DIAGNOSIS — L89612 Pressure ulcer of right heel, stage 2: Secondary | ICD-10-CM | POA: Diagnosis not present

## 2017-07-01 DIAGNOSIS — R2681 Unsteadiness on feet: Secondary | ICD-10-CM | POA: Diagnosis not present

## 2017-07-01 DIAGNOSIS — M6389 Disorders of muscle in diseases classified elsewhere, multiple sites: Secondary | ICD-10-CM | POA: Diagnosis not present

## 2017-07-01 DIAGNOSIS — R1312 Dysphagia, oropharyngeal phase: Secondary | ICD-10-CM | POA: Diagnosis not present

## 2017-07-02 DIAGNOSIS — R1312 Dysphagia, oropharyngeal phase: Secondary | ICD-10-CM | POA: Diagnosis not present

## 2017-07-02 DIAGNOSIS — M6389 Disorders of muscle in diseases classified elsewhere, multiple sites: Secondary | ICD-10-CM | POA: Diagnosis not present

## 2017-07-02 DIAGNOSIS — R278 Other lack of coordination: Secondary | ICD-10-CM | POA: Diagnosis not present

## 2017-07-02 DIAGNOSIS — R2681 Unsteadiness on feet: Secondary | ICD-10-CM | POA: Diagnosis not present

## 2017-07-02 DIAGNOSIS — S72452D Displaced supracondylar fracture without intracondylar extension of lower end of left femur, subsequent encounter for closed fracture with routine healing: Secondary | ICD-10-CM | POA: Diagnosis not present

## 2017-07-02 DIAGNOSIS — I2510 Atherosclerotic heart disease of native coronary artery without angina pectoris: Secondary | ICD-10-CM | POA: Diagnosis not present

## 2017-07-02 DIAGNOSIS — K219 Gastro-esophageal reflux disease without esophagitis: Secondary | ICD-10-CM | POA: Diagnosis not present

## 2017-07-03 DIAGNOSIS — R1312 Dysphagia, oropharyngeal phase: Secondary | ICD-10-CM | POA: Diagnosis not present

## 2017-07-03 DIAGNOSIS — K219 Gastro-esophageal reflux disease without esophagitis: Secondary | ICD-10-CM | POA: Diagnosis not present

## 2017-07-03 DIAGNOSIS — S72452D Displaced supracondylar fracture without intracondylar extension of lower end of left femur, subsequent encounter for closed fracture with routine healing: Secondary | ICD-10-CM | POA: Diagnosis not present

## 2017-07-03 DIAGNOSIS — I2510 Atherosclerotic heart disease of native coronary artery without angina pectoris: Secondary | ICD-10-CM | POA: Diagnosis not present

## 2017-07-03 DIAGNOSIS — R2681 Unsteadiness on feet: Secondary | ICD-10-CM | POA: Diagnosis not present

## 2017-07-03 DIAGNOSIS — M6389 Disorders of muscle in diseases classified elsewhere, multiple sites: Secondary | ICD-10-CM | POA: Diagnosis not present

## 2017-07-03 DIAGNOSIS — R278 Other lack of coordination: Secondary | ICD-10-CM | POA: Diagnosis not present

## 2017-07-04 DIAGNOSIS — S72452D Displaced supracondylar fracture without intracondylar extension of lower end of left femur, subsequent encounter for closed fracture with routine healing: Secondary | ICD-10-CM | POA: Diagnosis not present

## 2017-07-04 DIAGNOSIS — K219 Gastro-esophageal reflux disease without esophagitis: Secondary | ICD-10-CM | POA: Diagnosis not present

## 2017-07-04 DIAGNOSIS — M6389 Disorders of muscle in diseases classified elsewhere, multiple sites: Secondary | ICD-10-CM | POA: Diagnosis not present

## 2017-07-04 DIAGNOSIS — R278 Other lack of coordination: Secondary | ICD-10-CM | POA: Diagnosis not present

## 2017-07-04 DIAGNOSIS — I2510 Atherosclerotic heart disease of native coronary artery without angina pectoris: Secondary | ICD-10-CM | POA: Diagnosis not present

## 2017-07-04 DIAGNOSIS — R1312 Dysphagia, oropharyngeal phase: Secondary | ICD-10-CM | POA: Diagnosis not present

## 2017-07-04 DIAGNOSIS — R2681 Unsteadiness on feet: Secondary | ICD-10-CM | POA: Diagnosis not present

## 2017-07-07 DIAGNOSIS — M6389 Disorders of muscle in diseases classified elsewhere, multiple sites: Secondary | ICD-10-CM | POA: Diagnosis not present

## 2017-07-07 DIAGNOSIS — R278 Other lack of coordination: Secondary | ICD-10-CM | POA: Diagnosis not present

## 2017-07-07 DIAGNOSIS — S72452D Displaced supracondylar fracture without intracondylar extension of lower end of left femur, subsequent encounter for closed fracture with routine healing: Secondary | ICD-10-CM | POA: Diagnosis not present

## 2017-07-07 DIAGNOSIS — I2510 Atherosclerotic heart disease of native coronary artery without angina pectoris: Secondary | ICD-10-CM | POA: Diagnosis not present

## 2017-07-07 DIAGNOSIS — R1312 Dysphagia, oropharyngeal phase: Secondary | ICD-10-CM | POA: Diagnosis not present

## 2017-07-07 DIAGNOSIS — K219 Gastro-esophageal reflux disease without esophagitis: Secondary | ICD-10-CM | POA: Diagnosis not present

## 2017-07-07 DIAGNOSIS — R2681 Unsteadiness on feet: Secondary | ICD-10-CM | POA: Diagnosis not present

## 2017-07-08 DIAGNOSIS — R2681 Unsteadiness on feet: Secondary | ICD-10-CM | POA: Diagnosis not present

## 2017-07-08 DIAGNOSIS — I2510 Atherosclerotic heart disease of native coronary artery without angina pectoris: Secondary | ICD-10-CM | POA: Diagnosis not present

## 2017-07-08 DIAGNOSIS — R1312 Dysphagia, oropharyngeal phase: Secondary | ICD-10-CM | POA: Diagnosis not present

## 2017-07-08 DIAGNOSIS — R278 Other lack of coordination: Secondary | ICD-10-CM | POA: Diagnosis not present

## 2017-07-08 DIAGNOSIS — S72452D Displaced supracondylar fracture without intracondylar extension of lower end of left femur, subsequent encounter for closed fracture with routine healing: Secondary | ICD-10-CM | POA: Diagnosis not present

## 2017-07-08 DIAGNOSIS — L89612 Pressure ulcer of right heel, stage 2: Secondary | ICD-10-CM | POA: Diagnosis not present

## 2017-07-08 DIAGNOSIS — M6389 Disorders of muscle in diseases classified elsewhere, multiple sites: Secondary | ICD-10-CM | POA: Diagnosis not present

## 2017-07-08 DIAGNOSIS — K219 Gastro-esophageal reflux disease without esophagitis: Secondary | ICD-10-CM | POA: Diagnosis not present

## 2017-07-09 DIAGNOSIS — R278 Other lack of coordination: Secondary | ICD-10-CM | POA: Diagnosis not present

## 2017-07-09 DIAGNOSIS — I2510 Atherosclerotic heart disease of native coronary artery without angina pectoris: Secondary | ICD-10-CM | POA: Diagnosis not present

## 2017-07-09 DIAGNOSIS — M6389 Disorders of muscle in diseases classified elsewhere, multiple sites: Secondary | ICD-10-CM | POA: Diagnosis not present

## 2017-07-09 DIAGNOSIS — R1312 Dysphagia, oropharyngeal phase: Secondary | ICD-10-CM | POA: Diagnosis not present

## 2017-07-09 DIAGNOSIS — S72452D Displaced supracondylar fracture without intracondylar extension of lower end of left femur, subsequent encounter for closed fracture with routine healing: Secondary | ICD-10-CM | POA: Diagnosis not present

## 2017-07-09 DIAGNOSIS — K219 Gastro-esophageal reflux disease without esophagitis: Secondary | ICD-10-CM | POA: Diagnosis not present

## 2017-07-09 DIAGNOSIS — R2681 Unsteadiness on feet: Secondary | ICD-10-CM | POA: Diagnosis not present

## 2017-07-10 DIAGNOSIS — S72452D Displaced supracondylar fracture without intracondylar extension of lower end of left femur, subsequent encounter for closed fracture with routine healing: Secondary | ICD-10-CM | POA: Diagnosis not present

## 2017-07-10 DIAGNOSIS — R2681 Unsteadiness on feet: Secondary | ICD-10-CM | POA: Diagnosis not present

## 2017-07-10 DIAGNOSIS — M6389 Disorders of muscle in diseases classified elsewhere, multiple sites: Secondary | ICD-10-CM | POA: Diagnosis not present

## 2017-07-10 DIAGNOSIS — R1312 Dysphagia, oropharyngeal phase: Secondary | ICD-10-CM | POA: Diagnosis not present

## 2017-07-10 DIAGNOSIS — K219 Gastro-esophageal reflux disease without esophagitis: Secondary | ICD-10-CM | POA: Diagnosis not present

## 2017-07-10 DIAGNOSIS — I2510 Atherosclerotic heart disease of native coronary artery without angina pectoris: Secondary | ICD-10-CM | POA: Diagnosis not present

## 2017-07-10 DIAGNOSIS — R278 Other lack of coordination: Secondary | ICD-10-CM | POA: Diagnosis not present

## 2017-07-11 DIAGNOSIS — K219 Gastro-esophageal reflux disease without esophagitis: Secondary | ICD-10-CM | POA: Diagnosis not present

## 2017-07-11 DIAGNOSIS — R1312 Dysphagia, oropharyngeal phase: Secondary | ICD-10-CM | POA: Diagnosis not present

## 2017-07-11 DIAGNOSIS — R2681 Unsteadiness on feet: Secondary | ICD-10-CM | POA: Diagnosis not present

## 2017-07-11 DIAGNOSIS — R278 Other lack of coordination: Secondary | ICD-10-CM | POA: Diagnosis not present

## 2017-07-11 DIAGNOSIS — S72452D Displaced supracondylar fracture without intracondylar extension of lower end of left femur, subsequent encounter for closed fracture with routine healing: Secondary | ICD-10-CM | POA: Diagnosis not present

## 2017-07-11 DIAGNOSIS — M6389 Disorders of muscle in diseases classified elsewhere, multiple sites: Secondary | ICD-10-CM | POA: Diagnosis not present

## 2017-07-11 DIAGNOSIS — I2510 Atherosclerotic heart disease of native coronary artery without angina pectoris: Secondary | ICD-10-CM | POA: Diagnosis not present

## 2017-07-14 DIAGNOSIS — S72452D Displaced supracondylar fracture without intracondylar extension of lower end of left femur, subsequent encounter for closed fracture with routine healing: Secondary | ICD-10-CM | POA: Diagnosis not present

## 2017-07-14 DIAGNOSIS — M6389 Disorders of muscle in diseases classified elsewhere, multiple sites: Secondary | ICD-10-CM | POA: Diagnosis not present

## 2017-07-14 DIAGNOSIS — R1312 Dysphagia, oropharyngeal phase: Secondary | ICD-10-CM | POA: Diagnosis not present

## 2017-07-14 DIAGNOSIS — I2510 Atherosclerotic heart disease of native coronary artery without angina pectoris: Secondary | ICD-10-CM | POA: Diagnosis not present

## 2017-07-14 DIAGNOSIS — K219 Gastro-esophageal reflux disease without esophagitis: Secondary | ICD-10-CM | POA: Diagnosis not present

## 2017-07-14 DIAGNOSIS — R2681 Unsteadiness on feet: Secondary | ICD-10-CM | POA: Diagnosis not present

## 2017-07-14 DIAGNOSIS — R278 Other lack of coordination: Secondary | ICD-10-CM | POA: Diagnosis not present

## 2017-07-15 DIAGNOSIS — D649 Anemia, unspecified: Secondary | ICD-10-CM | POA: Diagnosis not present

## 2017-07-15 DIAGNOSIS — I2510 Atherosclerotic heart disease of native coronary artery without angina pectoris: Secondary | ICD-10-CM | POA: Diagnosis not present

## 2017-07-15 DIAGNOSIS — K219 Gastro-esophageal reflux disease without esophagitis: Secondary | ICD-10-CM | POA: Diagnosis not present

## 2017-07-15 DIAGNOSIS — S72452D Displaced supracondylar fracture without intracondylar extension of lower end of left femur, subsequent encounter for closed fracture with routine healing: Secondary | ICD-10-CM | POA: Diagnosis not present

## 2017-07-15 DIAGNOSIS — R2681 Unsteadiness on feet: Secondary | ICD-10-CM | POA: Diagnosis not present

## 2017-07-15 DIAGNOSIS — J9601 Acute respiratory failure with hypoxia: Secondary | ICD-10-CM | POA: Diagnosis not present

## 2017-07-15 DIAGNOSIS — M6389 Disorders of muscle in diseases classified elsewhere, multiple sites: Secondary | ICD-10-CM | POA: Diagnosis not present

## 2017-07-15 DIAGNOSIS — R0602 Shortness of breath: Secondary | ICD-10-CM | POA: Diagnosis not present

## 2017-07-15 DIAGNOSIS — L89612 Pressure ulcer of right heel, stage 2: Secondary | ICD-10-CM | POA: Diagnosis not present

## 2017-07-15 DIAGNOSIS — R278 Other lack of coordination: Secondary | ICD-10-CM | POA: Diagnosis not present

## 2017-07-15 DIAGNOSIS — R1312 Dysphagia, oropharyngeal phase: Secondary | ICD-10-CM | POA: Diagnosis not present

## 2017-07-16 DIAGNOSIS — M6389 Disorders of muscle in diseases classified elsewhere, multiple sites: Secondary | ICD-10-CM | POA: Diagnosis not present

## 2017-07-16 DIAGNOSIS — R488 Other symbolic dysfunctions: Secondary | ICD-10-CM | POA: Diagnosis not present

## 2017-07-16 DIAGNOSIS — R278 Other lack of coordination: Secondary | ICD-10-CM | POA: Diagnosis not present

## 2017-07-16 DIAGNOSIS — R0989 Other specified symptoms and signs involving the circulatory and respiratory systems: Secondary | ICD-10-CM | POA: Diagnosis not present

## 2017-07-16 DIAGNOSIS — S72452D Displaced supracondylar fracture without intracondylar extension of lower end of left femur, subsequent encounter for closed fracture with routine healing: Secondary | ICD-10-CM | POA: Diagnosis not present

## 2017-07-16 DIAGNOSIS — R05 Cough: Secondary | ICD-10-CM | POA: Diagnosis not present

## 2017-07-16 DIAGNOSIS — R2681 Unsteadiness on feet: Secondary | ICD-10-CM | POA: Diagnosis not present

## 2017-07-16 DIAGNOSIS — K219 Gastro-esophageal reflux disease without esophagitis: Secondary | ICD-10-CM | POA: Diagnosis not present

## 2017-07-16 DIAGNOSIS — I2510 Atherosclerotic heart disease of native coronary artery without angina pectoris: Secondary | ICD-10-CM | POA: Diagnosis not present

## 2017-07-16 DIAGNOSIS — R1312 Dysphagia, oropharyngeal phase: Secondary | ICD-10-CM | POA: Diagnosis not present

## 2017-07-16 DIAGNOSIS — R1310 Dysphagia, unspecified: Secondary | ICD-10-CM | POA: Diagnosis not present

## 2017-07-17 ENCOUNTER — Encounter: Payer: Self-pay | Admitting: Internal Medicine

## 2017-07-17 DIAGNOSIS — M6389 Disorders of muscle in diseases classified elsewhere, multiple sites: Secondary | ICD-10-CM | POA: Diagnosis not present

## 2017-07-17 DIAGNOSIS — R1312 Dysphagia, oropharyngeal phase: Secondary | ICD-10-CM | POA: Diagnosis not present

## 2017-07-17 DIAGNOSIS — I2510 Atherosclerotic heart disease of native coronary artery without angina pectoris: Secondary | ICD-10-CM | POA: Diagnosis not present

## 2017-07-17 DIAGNOSIS — R2681 Unsteadiness on feet: Secondary | ICD-10-CM | POA: Diagnosis not present

## 2017-07-17 DIAGNOSIS — R278 Other lack of coordination: Secondary | ICD-10-CM | POA: Diagnosis not present

## 2017-07-17 DIAGNOSIS — S72452D Displaced supracondylar fracture without intracondylar extension of lower end of left femur, subsequent encounter for closed fracture with routine healing: Secondary | ICD-10-CM | POA: Diagnosis not present

## 2017-07-17 DIAGNOSIS — K219 Gastro-esophageal reflux disease without esophagitis: Secondary | ICD-10-CM | POA: Diagnosis not present

## 2017-07-18 DIAGNOSIS — S72452D Displaced supracondylar fracture without intracondylar extension of lower end of left femur, subsequent encounter for closed fracture with routine healing: Secondary | ICD-10-CM | POA: Diagnosis not present

## 2017-07-18 DIAGNOSIS — K219 Gastro-esophageal reflux disease without esophagitis: Secondary | ICD-10-CM | POA: Diagnosis not present

## 2017-07-18 DIAGNOSIS — I2510 Atherosclerotic heart disease of native coronary artery without angina pectoris: Secondary | ICD-10-CM | POA: Diagnosis not present

## 2017-07-18 DIAGNOSIS — R1312 Dysphagia, oropharyngeal phase: Secondary | ICD-10-CM | POA: Diagnosis not present

## 2017-07-18 DIAGNOSIS — M6389 Disorders of muscle in diseases classified elsewhere, multiple sites: Secondary | ICD-10-CM | POA: Diagnosis not present

## 2017-07-18 DIAGNOSIS — R2681 Unsteadiness on feet: Secondary | ICD-10-CM | POA: Diagnosis not present

## 2017-07-18 DIAGNOSIS — R278 Other lack of coordination: Secondary | ICD-10-CM | POA: Diagnosis not present

## 2017-07-22 DIAGNOSIS — L89612 Pressure ulcer of right heel, stage 2: Secondary | ICD-10-CM | POA: Diagnosis not present

## 2017-07-29 DIAGNOSIS — L89612 Pressure ulcer of right heel, stage 2: Secondary | ICD-10-CM | POA: Diagnosis not present

## 2017-08-05 DIAGNOSIS — L89612 Pressure ulcer of right heel, stage 2: Secondary | ICD-10-CM | POA: Diagnosis not present

## 2017-08-12 DIAGNOSIS — L89612 Pressure ulcer of right heel, stage 2: Secondary | ICD-10-CM | POA: Diagnosis not present

## 2017-08-19 DIAGNOSIS — L89612 Pressure ulcer of right heel, stage 2: Secondary | ICD-10-CM | POA: Diagnosis not present

## 2017-08-26 DIAGNOSIS — L89612 Pressure ulcer of right heel, stage 2: Secondary | ICD-10-CM | POA: Diagnosis not present

## 2017-09-17 DIAGNOSIS — B351 Tinea unguium: Secondary | ICD-10-CM | POA: Diagnosis not present

## 2017-09-17 DIAGNOSIS — L84 Corns and callosities: Secondary | ICD-10-CM | POA: Diagnosis not present

## 2017-09-17 DIAGNOSIS — I739 Peripheral vascular disease, unspecified: Secondary | ICD-10-CM | POA: Diagnosis not present

## 2017-10-05 DIAGNOSIS — G6181 Chronic inflammatory demyelinating polyneuritis: Secondary | ICD-10-CM | POA: Diagnosis not present

## 2017-10-05 DIAGNOSIS — G4733 Obstructive sleep apnea (adult) (pediatric): Secondary | ICD-10-CM | POA: Diagnosis not present

## 2017-10-05 DIAGNOSIS — K219 Gastro-esophageal reflux disease without esophagitis: Secondary | ICD-10-CM | POA: Diagnosis not present

## 2017-10-05 DIAGNOSIS — I4891 Unspecified atrial fibrillation: Secondary | ICD-10-CM | POA: Diagnosis not present

## 2017-10-05 DIAGNOSIS — I509 Heart failure, unspecified: Secondary | ICD-10-CM | POA: Diagnosis not present

## 2017-10-16 IMAGING — CR DG HIP (WITH OR WITHOUT PELVIS) 2-3V*L*
3 series · 3 of 3 positions shown · non-contrast
Comparison: None

CLINICAL DATA: Fell last night, LEFT hip pain with limited range of
motion question fracture, initial encounter

EXAM:
DG HIP (WITH OR WITHOUT PELVIS) 2-3V LEFT

[w hip ap left (1 of 2)]
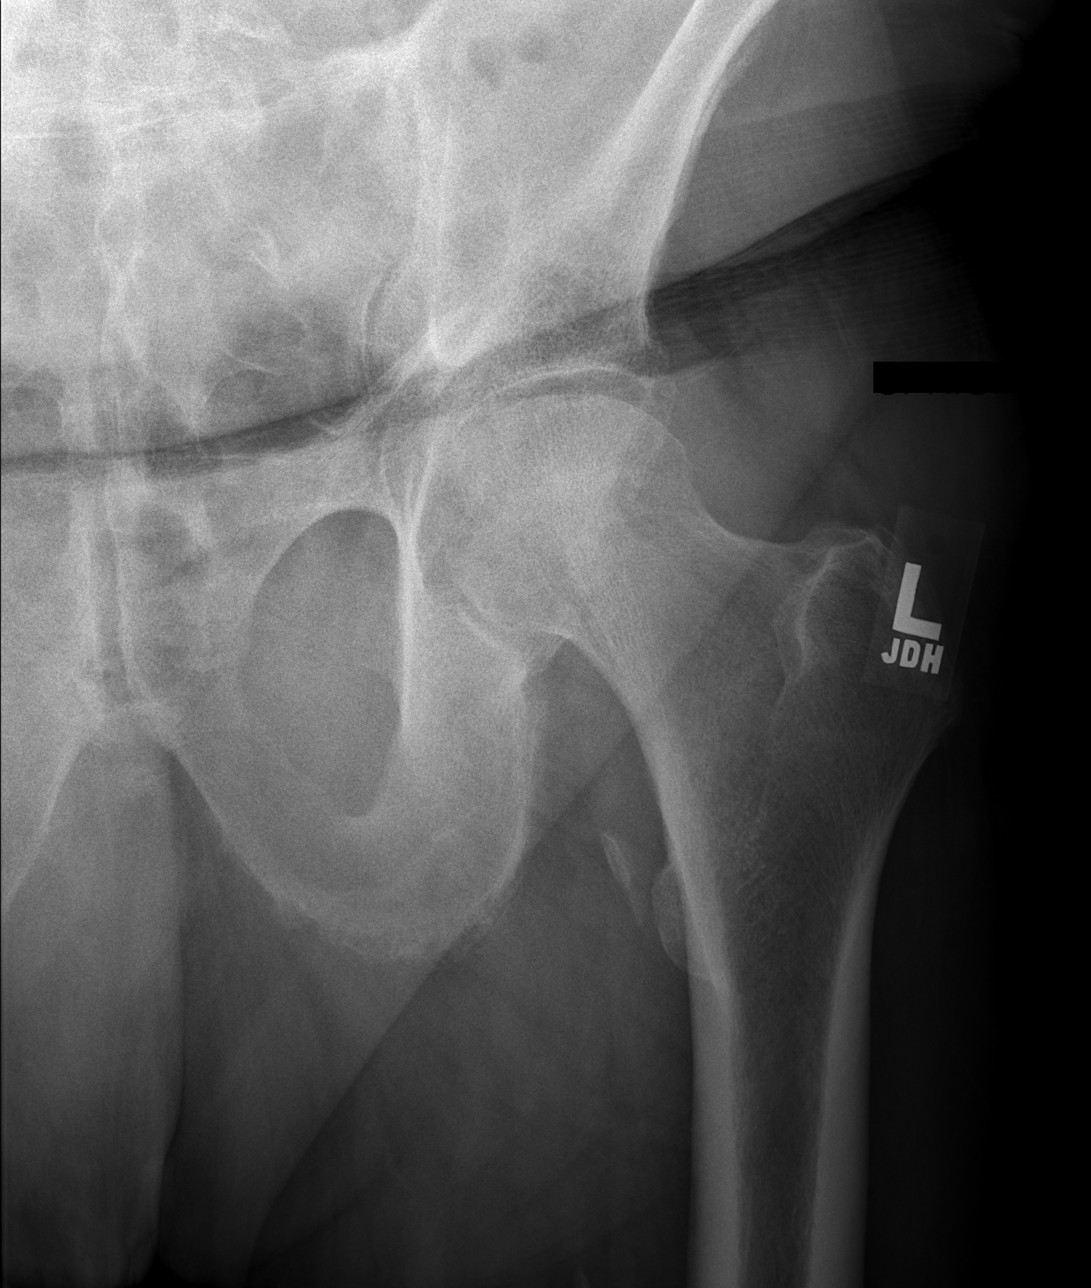

[w hip ap left (2 of 2)]
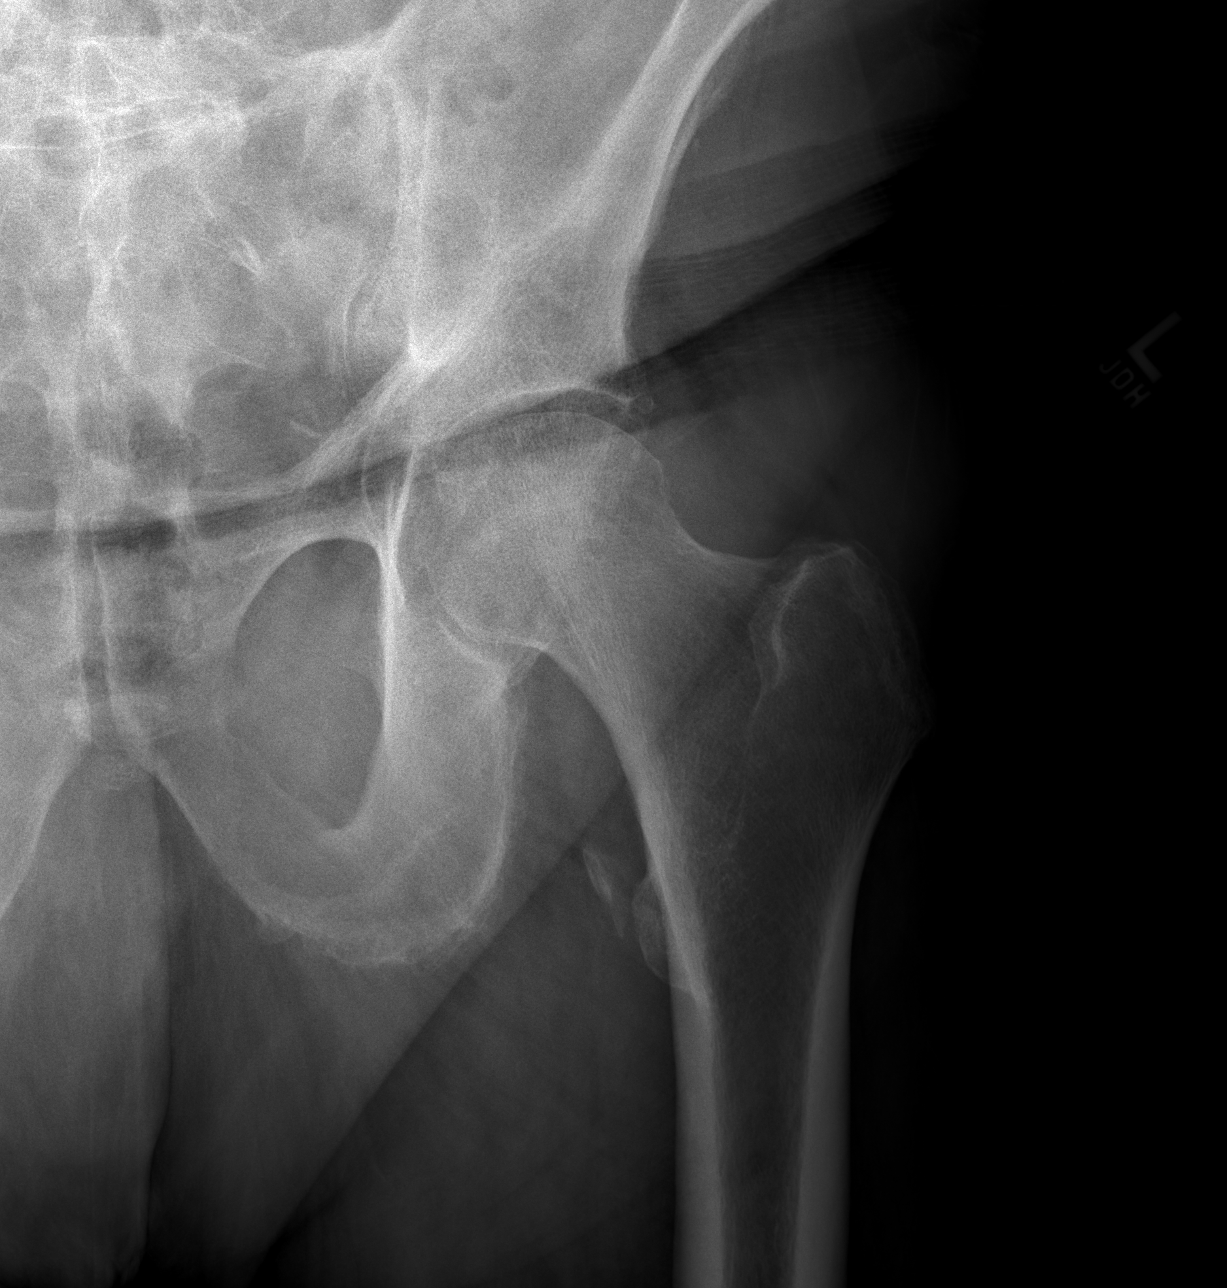

[t hip frog left]
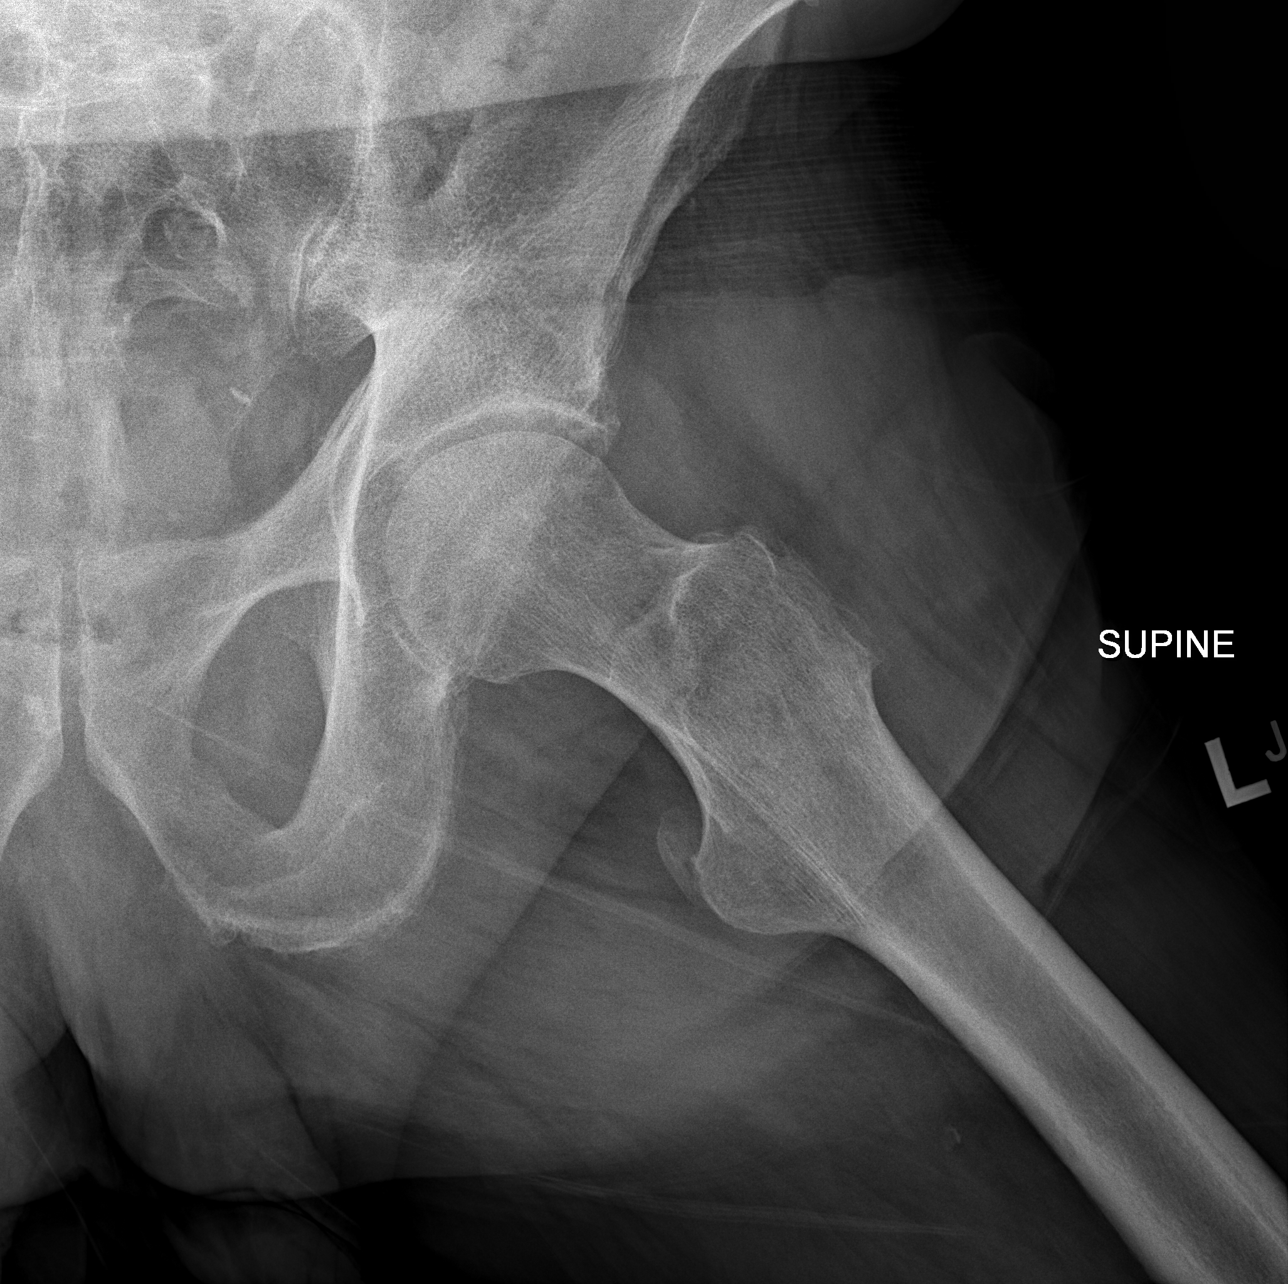

[3 of 3 positions shown; findings below may reference images not displayed]

FINDINGS: Osseous demineralization.

Minimal narrowing of LEFT hip joint.

No definite fracture, dislocation or bone destruction.

Minimal tendinous calcification at the distal LEFT iliopsoas tendon.
IMPRESSION: No acute osseous abnormalities.

## 2017-10-17 DIAGNOSIS — N390 Urinary tract infection, site not specified: Secondary | ICD-10-CM | POA: Diagnosis not present

## 2017-10-17 DIAGNOSIS — D649 Anemia, unspecified: Secondary | ICD-10-CM | POA: Diagnosis not present

## 2017-10-17 DIAGNOSIS — Z79899 Other long term (current) drug therapy: Secondary | ICD-10-CM | POA: Diagnosis not present

## 2017-10-17 DIAGNOSIS — R05 Cough: Secondary | ICD-10-CM | POA: Diagnosis not present

## 2017-10-17 DIAGNOSIS — R0602 Shortness of breath: Secondary | ICD-10-CM | POA: Diagnosis not present

## 2017-10-17 DIAGNOSIS — R319 Hematuria, unspecified: Secondary | ICD-10-CM | POA: Diagnosis not present

## 2017-10-18 DIAGNOSIS — Z79899 Other long term (current) drug therapy: Secondary | ICD-10-CM | POA: Diagnosis not present

## 2017-10-19 DIAGNOSIS — M7989 Other specified soft tissue disorders: Secondary | ICD-10-CM | POA: Diagnosis not present

## 2017-10-19 DIAGNOSIS — M545 Low back pain: Secondary | ICD-10-CM | POA: Diagnosis not present

## 2017-10-22 DIAGNOSIS — J918 Pleural effusion in other conditions classified elsewhere: Secondary | ICD-10-CM | POA: Diagnosis not present

## 2017-10-22 DIAGNOSIS — D649 Anemia, unspecified: Secondary | ICD-10-CM | POA: Diagnosis not present

## 2017-10-22 DIAGNOSIS — Z79899 Other long term (current) drug therapy: Secondary | ICD-10-CM | POA: Diagnosis not present

## 2017-10-22 DIAGNOSIS — R0602 Shortness of breath: Secondary | ICD-10-CM | POA: Diagnosis not present

## 2017-10-23 DIAGNOSIS — E785 Hyperlipidemia, unspecified: Secondary | ICD-10-CM | POA: Diagnosis not present

## 2017-10-23 DIAGNOSIS — I10 Essential (primary) hypertension: Secondary | ICD-10-CM | POA: Diagnosis not present

## 2017-10-23 DIAGNOSIS — I2699 Other pulmonary embolism without acute cor pulmonale: Secondary | ICD-10-CM | POA: Diagnosis not present

## 2017-10-23 DIAGNOSIS — R0600 Dyspnea, unspecified: Secondary | ICD-10-CM | POA: Diagnosis not present

## 2017-10-23 DIAGNOSIS — K219 Gastro-esophageal reflux disease without esophagitis: Secondary | ICD-10-CM | POA: Diagnosis not present

## 2017-10-23 DIAGNOSIS — I4891 Unspecified atrial fibrillation: Secondary | ICD-10-CM | POA: Diagnosis not present

## 2017-10-23 DIAGNOSIS — G619 Inflammatory polyneuropathy, unspecified: Secondary | ICD-10-CM | POA: Diagnosis not present

## 2017-10-23 DIAGNOSIS — I25119 Atherosclerotic heart disease of native coronary artery with unspecified angina pectoris: Secondary | ICD-10-CM | POA: Diagnosis not present

## 2017-10-23 DIAGNOSIS — G4733 Obstructive sleep apnea (adult) (pediatric): Secondary | ICD-10-CM | POA: Diagnosis not present

## 2017-10-23 DIAGNOSIS — I509 Heart failure, unspecified: Secondary | ICD-10-CM | POA: Diagnosis not present

## 2017-10-24 DIAGNOSIS — I509 Heart failure, unspecified: Secondary | ICD-10-CM | POA: Diagnosis not present

## 2017-10-24 DIAGNOSIS — G619 Inflammatory polyneuropathy, unspecified: Secondary | ICD-10-CM | POA: Diagnosis not present

## 2017-10-24 DIAGNOSIS — I25119 Atherosclerotic heart disease of native coronary artery with unspecified angina pectoris: Secondary | ICD-10-CM | POA: Diagnosis not present

## 2017-10-24 DIAGNOSIS — R0600 Dyspnea, unspecified: Secondary | ICD-10-CM | POA: Diagnosis not present

## 2017-10-24 DIAGNOSIS — I2699 Other pulmonary embolism without acute cor pulmonale: Secondary | ICD-10-CM | POA: Diagnosis not present

## 2017-10-24 DIAGNOSIS — E785 Hyperlipidemia, unspecified: Secondary | ICD-10-CM | POA: Diagnosis not present

## 2017-10-24 DIAGNOSIS — K219 Gastro-esophageal reflux disease without esophagitis: Secondary | ICD-10-CM | POA: Diagnosis not present

## 2017-10-24 DIAGNOSIS — I4891 Unspecified atrial fibrillation: Secondary | ICD-10-CM | POA: Diagnosis not present

## 2017-10-24 DIAGNOSIS — I10 Essential (primary) hypertension: Secondary | ICD-10-CM | POA: Diagnosis not present

## 2017-10-24 DIAGNOSIS — G4733 Obstructive sleep apnea (adult) (pediatric): Secondary | ICD-10-CM | POA: Diagnosis not present

## 2017-10-25 DIAGNOSIS — K219 Gastro-esophageal reflux disease without esophagitis: Secondary | ICD-10-CM | POA: Diagnosis not present

## 2017-10-25 DIAGNOSIS — E785 Hyperlipidemia, unspecified: Secondary | ICD-10-CM | POA: Diagnosis not present

## 2017-10-25 DIAGNOSIS — I10 Essential (primary) hypertension: Secondary | ICD-10-CM | POA: Diagnosis not present

## 2017-10-25 DIAGNOSIS — G4733 Obstructive sleep apnea (adult) (pediatric): Secondary | ICD-10-CM | POA: Diagnosis not present

## 2017-10-25 DIAGNOSIS — I4891 Unspecified atrial fibrillation: Secondary | ICD-10-CM | POA: Diagnosis not present

## 2017-10-25 DIAGNOSIS — R0600 Dyspnea, unspecified: Secondary | ICD-10-CM | POA: Diagnosis not present

## 2017-10-25 DIAGNOSIS — I509 Heart failure, unspecified: Secondary | ICD-10-CM | POA: Diagnosis not present

## 2017-10-25 DIAGNOSIS — I2699 Other pulmonary embolism without acute cor pulmonale: Secondary | ICD-10-CM | POA: Diagnosis not present

## 2017-10-25 DIAGNOSIS — G619 Inflammatory polyneuropathy, unspecified: Secondary | ICD-10-CM | POA: Diagnosis not present

## 2017-10-25 DIAGNOSIS — I25119 Atherosclerotic heart disease of native coronary artery with unspecified angina pectoris: Secondary | ICD-10-CM | POA: Diagnosis not present

## 2017-10-26 DIAGNOSIS — G619 Inflammatory polyneuropathy, unspecified: Secondary | ICD-10-CM | POA: Diagnosis not present

## 2017-10-26 DIAGNOSIS — R0600 Dyspnea, unspecified: Secondary | ICD-10-CM | POA: Diagnosis not present

## 2017-10-26 DIAGNOSIS — E785 Hyperlipidemia, unspecified: Secondary | ICD-10-CM | POA: Diagnosis not present

## 2017-10-26 DIAGNOSIS — G4733 Obstructive sleep apnea (adult) (pediatric): Secondary | ICD-10-CM | POA: Diagnosis not present

## 2017-10-26 DIAGNOSIS — K219 Gastro-esophageal reflux disease without esophagitis: Secondary | ICD-10-CM | POA: Diagnosis not present

## 2017-10-26 DIAGNOSIS — I10 Essential (primary) hypertension: Secondary | ICD-10-CM | POA: Diagnosis not present

## 2017-10-26 DIAGNOSIS — I25119 Atherosclerotic heart disease of native coronary artery with unspecified angina pectoris: Secondary | ICD-10-CM | POA: Diagnosis not present

## 2017-10-26 DIAGNOSIS — I4891 Unspecified atrial fibrillation: Secondary | ICD-10-CM | POA: Diagnosis not present

## 2017-10-26 DIAGNOSIS — I2699 Other pulmonary embolism without acute cor pulmonale: Secondary | ICD-10-CM | POA: Diagnosis not present

## 2017-10-26 DIAGNOSIS — I509 Heart failure, unspecified: Secondary | ICD-10-CM | POA: Diagnosis not present

## 2017-10-27 DIAGNOSIS — I25119 Atherosclerotic heart disease of native coronary artery with unspecified angina pectoris: Secondary | ICD-10-CM | POA: Diagnosis not present

## 2017-10-27 DIAGNOSIS — I509 Heart failure, unspecified: Secondary | ICD-10-CM | POA: Diagnosis not present

## 2017-10-27 DIAGNOSIS — G619 Inflammatory polyneuropathy, unspecified: Secondary | ICD-10-CM | POA: Diagnosis not present

## 2017-10-27 DIAGNOSIS — E785 Hyperlipidemia, unspecified: Secondary | ICD-10-CM | POA: Diagnosis not present

## 2017-10-27 DIAGNOSIS — I2699 Other pulmonary embolism without acute cor pulmonale: Secondary | ICD-10-CM | POA: Diagnosis not present

## 2017-10-27 DIAGNOSIS — G4733 Obstructive sleep apnea (adult) (pediatric): Secondary | ICD-10-CM | POA: Diagnosis not present

## 2017-10-27 DIAGNOSIS — I4891 Unspecified atrial fibrillation: Secondary | ICD-10-CM | POA: Diagnosis not present

## 2017-10-27 DIAGNOSIS — R0600 Dyspnea, unspecified: Secondary | ICD-10-CM | POA: Diagnosis not present

## 2017-10-27 DIAGNOSIS — I10 Essential (primary) hypertension: Secondary | ICD-10-CM | POA: Diagnosis not present

## 2017-10-27 DIAGNOSIS — K219 Gastro-esophageal reflux disease without esophagitis: Secondary | ICD-10-CM | POA: Diagnosis not present

## 2017-10-28 DIAGNOSIS — G4733 Obstructive sleep apnea (adult) (pediatric): Secondary | ICD-10-CM | POA: Diagnosis not present

## 2017-10-28 DIAGNOSIS — I10 Essential (primary) hypertension: Secondary | ICD-10-CM | POA: Diagnosis not present

## 2017-10-28 DIAGNOSIS — I2699 Other pulmonary embolism without acute cor pulmonale: Secondary | ICD-10-CM | POA: Diagnosis not present

## 2017-10-28 DIAGNOSIS — G619 Inflammatory polyneuropathy, unspecified: Secondary | ICD-10-CM | POA: Diagnosis not present

## 2017-10-28 DIAGNOSIS — R0600 Dyspnea, unspecified: Secondary | ICD-10-CM | POA: Diagnosis not present

## 2017-10-28 DIAGNOSIS — K219 Gastro-esophageal reflux disease without esophagitis: Secondary | ICD-10-CM | POA: Diagnosis not present

## 2017-10-28 DIAGNOSIS — I4891 Unspecified atrial fibrillation: Secondary | ICD-10-CM | POA: Diagnosis not present

## 2017-10-28 DIAGNOSIS — I25119 Atherosclerotic heart disease of native coronary artery with unspecified angina pectoris: Secondary | ICD-10-CM | POA: Diagnosis not present

## 2017-10-28 DIAGNOSIS — E785 Hyperlipidemia, unspecified: Secondary | ICD-10-CM | POA: Diagnosis not present

## 2017-10-28 DIAGNOSIS — I509 Heart failure, unspecified: Secondary | ICD-10-CM | POA: Diagnosis not present

## 2017-10-29 DIAGNOSIS — E785 Hyperlipidemia, unspecified: Secondary | ICD-10-CM | POA: Diagnosis not present

## 2017-10-29 DIAGNOSIS — I4891 Unspecified atrial fibrillation: Secondary | ICD-10-CM | POA: Diagnosis not present

## 2017-10-29 DIAGNOSIS — G4733 Obstructive sleep apnea (adult) (pediatric): Secondary | ICD-10-CM | POA: Diagnosis not present

## 2017-10-29 DIAGNOSIS — I509 Heart failure, unspecified: Secondary | ICD-10-CM | POA: Diagnosis not present

## 2017-10-29 DIAGNOSIS — G619 Inflammatory polyneuropathy, unspecified: Secondary | ICD-10-CM | POA: Diagnosis not present

## 2017-10-29 DIAGNOSIS — R0600 Dyspnea, unspecified: Secondary | ICD-10-CM | POA: Diagnosis not present

## 2017-10-29 DIAGNOSIS — I25119 Atherosclerotic heart disease of native coronary artery with unspecified angina pectoris: Secondary | ICD-10-CM | POA: Diagnosis not present

## 2017-10-29 DIAGNOSIS — K219 Gastro-esophageal reflux disease without esophagitis: Secondary | ICD-10-CM | POA: Diagnosis not present

## 2017-10-29 DIAGNOSIS — I2699 Other pulmonary embolism without acute cor pulmonale: Secondary | ICD-10-CM | POA: Diagnosis not present

## 2017-10-29 DIAGNOSIS — I10 Essential (primary) hypertension: Secondary | ICD-10-CM | POA: Diagnosis not present

## 2017-10-30 DIAGNOSIS — E785 Hyperlipidemia, unspecified: Secondary | ICD-10-CM | POA: Diagnosis not present

## 2017-10-30 DIAGNOSIS — K219 Gastro-esophageal reflux disease without esophagitis: Secondary | ICD-10-CM | POA: Diagnosis not present

## 2017-10-30 DIAGNOSIS — G619 Inflammatory polyneuropathy, unspecified: Secondary | ICD-10-CM | POA: Diagnosis not present

## 2017-10-30 DIAGNOSIS — G4733 Obstructive sleep apnea (adult) (pediatric): Secondary | ICD-10-CM | POA: Diagnosis not present

## 2017-10-30 DIAGNOSIS — I4891 Unspecified atrial fibrillation: Secondary | ICD-10-CM | POA: Diagnosis not present

## 2017-10-30 DIAGNOSIS — I2699 Other pulmonary embolism without acute cor pulmonale: Secondary | ICD-10-CM | POA: Diagnosis not present

## 2017-10-30 DIAGNOSIS — I25119 Atherosclerotic heart disease of native coronary artery with unspecified angina pectoris: Secondary | ICD-10-CM | POA: Diagnosis not present

## 2017-10-30 DIAGNOSIS — I509 Heart failure, unspecified: Secondary | ICD-10-CM | POA: Diagnosis not present

## 2017-10-30 DIAGNOSIS — I10 Essential (primary) hypertension: Secondary | ICD-10-CM | POA: Diagnosis not present

## 2017-10-30 DIAGNOSIS — R0600 Dyspnea, unspecified: Secondary | ICD-10-CM | POA: Diagnosis not present

## 2017-10-31 DIAGNOSIS — I509 Heart failure, unspecified: Secondary | ICD-10-CM | POA: Diagnosis not present

## 2017-10-31 DIAGNOSIS — G619 Inflammatory polyneuropathy, unspecified: Secondary | ICD-10-CM | POA: Diagnosis not present

## 2017-10-31 DIAGNOSIS — I2699 Other pulmonary embolism without acute cor pulmonale: Secondary | ICD-10-CM | POA: Diagnosis not present

## 2017-10-31 DIAGNOSIS — G4733 Obstructive sleep apnea (adult) (pediatric): Secondary | ICD-10-CM | POA: Diagnosis not present

## 2017-10-31 DIAGNOSIS — I4891 Unspecified atrial fibrillation: Secondary | ICD-10-CM | POA: Diagnosis not present

## 2017-10-31 DIAGNOSIS — R0600 Dyspnea, unspecified: Secondary | ICD-10-CM | POA: Diagnosis not present

## 2017-10-31 DIAGNOSIS — E785 Hyperlipidemia, unspecified: Secondary | ICD-10-CM | POA: Diagnosis not present

## 2017-10-31 DIAGNOSIS — K219 Gastro-esophageal reflux disease without esophagitis: Secondary | ICD-10-CM | POA: Diagnosis not present

## 2017-10-31 DIAGNOSIS — I10 Essential (primary) hypertension: Secondary | ICD-10-CM | POA: Diagnosis not present

## 2017-10-31 DIAGNOSIS — I25119 Atherosclerotic heart disease of native coronary artery with unspecified angina pectoris: Secondary | ICD-10-CM | POA: Diagnosis not present

## 2017-11-01 DIAGNOSIS — E785 Hyperlipidemia, unspecified: Secondary | ICD-10-CM | POA: Diagnosis not present

## 2017-11-01 DIAGNOSIS — G4733 Obstructive sleep apnea (adult) (pediatric): Secondary | ICD-10-CM | POA: Diagnosis not present

## 2017-11-01 DIAGNOSIS — K219 Gastro-esophageal reflux disease without esophagitis: Secondary | ICD-10-CM | POA: Diagnosis not present

## 2017-11-01 DIAGNOSIS — I509 Heart failure, unspecified: Secondary | ICD-10-CM | POA: Diagnosis not present

## 2017-11-01 DIAGNOSIS — I10 Essential (primary) hypertension: Secondary | ICD-10-CM | POA: Diagnosis not present

## 2017-11-01 DIAGNOSIS — I25119 Atherosclerotic heart disease of native coronary artery with unspecified angina pectoris: Secondary | ICD-10-CM | POA: Diagnosis not present

## 2017-11-01 DIAGNOSIS — R0600 Dyspnea, unspecified: Secondary | ICD-10-CM | POA: Diagnosis not present

## 2017-11-01 DIAGNOSIS — I4891 Unspecified atrial fibrillation: Secondary | ICD-10-CM | POA: Diagnosis not present

## 2017-11-01 DIAGNOSIS — I2699 Other pulmonary embolism without acute cor pulmonale: Secondary | ICD-10-CM | POA: Diagnosis not present

## 2017-11-01 DIAGNOSIS — G619 Inflammatory polyneuropathy, unspecified: Secondary | ICD-10-CM | POA: Diagnosis not present

## 2017-11-02 DIAGNOSIS — I10 Essential (primary) hypertension: Secondary | ICD-10-CM | POA: Diagnosis not present

## 2017-11-02 DIAGNOSIS — E785 Hyperlipidemia, unspecified: Secondary | ICD-10-CM | POA: Diagnosis not present

## 2017-11-02 DIAGNOSIS — G4733 Obstructive sleep apnea (adult) (pediatric): Secondary | ICD-10-CM | POA: Diagnosis not present

## 2017-11-02 DIAGNOSIS — K219 Gastro-esophageal reflux disease without esophagitis: Secondary | ICD-10-CM | POA: Diagnosis not present

## 2017-11-02 DIAGNOSIS — I509 Heart failure, unspecified: Secondary | ICD-10-CM | POA: Diagnosis not present

## 2017-11-02 DIAGNOSIS — G619 Inflammatory polyneuropathy, unspecified: Secondary | ICD-10-CM | POA: Diagnosis not present

## 2017-11-02 DIAGNOSIS — R0600 Dyspnea, unspecified: Secondary | ICD-10-CM | POA: Diagnosis not present

## 2017-11-02 DIAGNOSIS — I2699 Other pulmonary embolism without acute cor pulmonale: Secondary | ICD-10-CM | POA: Diagnosis not present

## 2017-11-02 DIAGNOSIS — I25119 Atherosclerotic heart disease of native coronary artery with unspecified angina pectoris: Secondary | ICD-10-CM | POA: Diagnosis not present

## 2017-11-02 DIAGNOSIS — I4891 Unspecified atrial fibrillation: Secondary | ICD-10-CM | POA: Diagnosis not present

## 2017-11-03 DIAGNOSIS — I25119 Atherosclerotic heart disease of native coronary artery with unspecified angina pectoris: Secondary | ICD-10-CM | POA: Diagnosis not present

## 2017-11-03 DIAGNOSIS — G4733 Obstructive sleep apnea (adult) (pediatric): Secondary | ICD-10-CM | POA: Diagnosis not present

## 2017-11-03 DIAGNOSIS — I2699 Other pulmonary embolism without acute cor pulmonale: Secondary | ICD-10-CM | POA: Diagnosis not present

## 2017-11-03 DIAGNOSIS — I4891 Unspecified atrial fibrillation: Secondary | ICD-10-CM | POA: Diagnosis not present

## 2017-11-03 DIAGNOSIS — G619 Inflammatory polyneuropathy, unspecified: Secondary | ICD-10-CM | POA: Diagnosis not present

## 2017-11-03 DIAGNOSIS — R0600 Dyspnea, unspecified: Secondary | ICD-10-CM | POA: Diagnosis not present

## 2017-11-03 DIAGNOSIS — I509 Heart failure, unspecified: Secondary | ICD-10-CM | POA: Diagnosis not present

## 2017-11-03 DIAGNOSIS — K219 Gastro-esophageal reflux disease without esophagitis: Secondary | ICD-10-CM | POA: Diagnosis not present

## 2017-11-03 DIAGNOSIS — I10 Essential (primary) hypertension: Secondary | ICD-10-CM | POA: Diagnosis not present

## 2017-11-03 DIAGNOSIS — E785 Hyperlipidemia, unspecified: Secondary | ICD-10-CM | POA: Diagnosis not present

## 2017-11-04 DIAGNOSIS — G4733 Obstructive sleep apnea (adult) (pediatric): Secondary | ICD-10-CM | POA: Diagnosis not present

## 2017-11-04 DIAGNOSIS — I25119 Atherosclerotic heart disease of native coronary artery with unspecified angina pectoris: Secondary | ICD-10-CM | POA: Diagnosis not present

## 2017-11-04 DIAGNOSIS — I509 Heart failure, unspecified: Secondary | ICD-10-CM | POA: Diagnosis not present

## 2017-11-04 DIAGNOSIS — K219 Gastro-esophageal reflux disease without esophagitis: Secondary | ICD-10-CM | POA: Diagnosis not present

## 2017-11-04 DIAGNOSIS — R0600 Dyspnea, unspecified: Secondary | ICD-10-CM | POA: Diagnosis not present

## 2017-11-04 DIAGNOSIS — I2699 Other pulmonary embolism without acute cor pulmonale: Secondary | ICD-10-CM | POA: Diagnosis not present

## 2017-11-04 DIAGNOSIS — G619 Inflammatory polyneuropathy, unspecified: Secondary | ICD-10-CM | POA: Diagnosis not present

## 2017-11-04 DIAGNOSIS — I10 Essential (primary) hypertension: Secondary | ICD-10-CM | POA: Diagnosis not present

## 2017-11-04 DIAGNOSIS — I4891 Unspecified atrial fibrillation: Secondary | ICD-10-CM | POA: Diagnosis not present

## 2017-11-04 DIAGNOSIS — E785 Hyperlipidemia, unspecified: Secondary | ICD-10-CM | POA: Diagnosis not present

## 2017-11-05 DIAGNOSIS — R0600 Dyspnea, unspecified: Secondary | ICD-10-CM | POA: Diagnosis not present

## 2017-11-05 DIAGNOSIS — E785 Hyperlipidemia, unspecified: Secondary | ICD-10-CM | POA: Diagnosis not present

## 2017-11-05 DIAGNOSIS — G4733 Obstructive sleep apnea (adult) (pediatric): Secondary | ICD-10-CM | POA: Diagnosis not present

## 2017-11-05 DIAGNOSIS — G619 Inflammatory polyneuropathy, unspecified: Secondary | ICD-10-CM | POA: Diagnosis not present

## 2017-11-05 DIAGNOSIS — I25119 Atherosclerotic heart disease of native coronary artery with unspecified angina pectoris: Secondary | ICD-10-CM | POA: Diagnosis not present

## 2017-11-05 DIAGNOSIS — K219 Gastro-esophageal reflux disease without esophagitis: Secondary | ICD-10-CM | POA: Diagnosis not present

## 2017-11-05 DIAGNOSIS — I4891 Unspecified atrial fibrillation: Secondary | ICD-10-CM | POA: Diagnosis not present

## 2017-11-05 DIAGNOSIS — I2699 Other pulmonary embolism without acute cor pulmonale: Secondary | ICD-10-CM | POA: Diagnosis not present

## 2017-11-05 DIAGNOSIS — I509 Heart failure, unspecified: Secondary | ICD-10-CM | POA: Diagnosis not present

## 2017-11-05 DIAGNOSIS — I10 Essential (primary) hypertension: Secondary | ICD-10-CM | POA: Diagnosis not present

## 2017-11-06 DIAGNOSIS — G619 Inflammatory polyneuropathy, unspecified: Secondary | ICD-10-CM | POA: Diagnosis not present

## 2017-11-06 DIAGNOSIS — I10 Essential (primary) hypertension: Secondary | ICD-10-CM | POA: Diagnosis not present

## 2017-11-06 DIAGNOSIS — K219 Gastro-esophageal reflux disease without esophagitis: Secondary | ICD-10-CM | POA: Diagnosis not present

## 2017-11-06 DIAGNOSIS — I25119 Atherosclerotic heart disease of native coronary artery with unspecified angina pectoris: Secondary | ICD-10-CM | POA: Diagnosis not present

## 2017-11-06 DIAGNOSIS — G4733 Obstructive sleep apnea (adult) (pediatric): Secondary | ICD-10-CM | POA: Diagnosis not present

## 2017-11-06 DIAGNOSIS — I4891 Unspecified atrial fibrillation: Secondary | ICD-10-CM | POA: Diagnosis not present

## 2017-11-06 DIAGNOSIS — I2699 Other pulmonary embolism without acute cor pulmonale: Secondary | ICD-10-CM | POA: Diagnosis not present

## 2017-11-06 DIAGNOSIS — E785 Hyperlipidemia, unspecified: Secondary | ICD-10-CM | POA: Diagnosis not present

## 2017-11-06 DIAGNOSIS — R0600 Dyspnea, unspecified: Secondary | ICD-10-CM | POA: Diagnosis not present

## 2017-11-06 DIAGNOSIS — I509 Heart failure, unspecified: Secondary | ICD-10-CM | POA: Diagnosis not present

## 2017-11-07 DIAGNOSIS — K219 Gastro-esophageal reflux disease without esophagitis: Secondary | ICD-10-CM | POA: Diagnosis not present

## 2017-11-07 DIAGNOSIS — E785 Hyperlipidemia, unspecified: Secondary | ICD-10-CM | POA: Diagnosis not present

## 2017-11-07 DIAGNOSIS — R0600 Dyspnea, unspecified: Secondary | ICD-10-CM | POA: Diagnosis not present

## 2017-11-07 DIAGNOSIS — I10 Essential (primary) hypertension: Secondary | ICD-10-CM | POA: Diagnosis not present

## 2017-11-07 DIAGNOSIS — I25119 Atherosclerotic heart disease of native coronary artery with unspecified angina pectoris: Secondary | ICD-10-CM | POA: Diagnosis not present

## 2017-11-07 DIAGNOSIS — G4733 Obstructive sleep apnea (adult) (pediatric): Secondary | ICD-10-CM | POA: Diagnosis not present

## 2017-11-07 DIAGNOSIS — I4891 Unspecified atrial fibrillation: Secondary | ICD-10-CM | POA: Diagnosis not present

## 2017-11-07 DIAGNOSIS — G619 Inflammatory polyneuropathy, unspecified: Secondary | ICD-10-CM | POA: Diagnosis not present

## 2017-11-07 DIAGNOSIS — I509 Heart failure, unspecified: Secondary | ICD-10-CM | POA: Diagnosis not present

## 2017-11-07 DIAGNOSIS — I2699 Other pulmonary embolism without acute cor pulmonale: Secondary | ICD-10-CM | POA: Diagnosis not present

## 2017-11-08 DIAGNOSIS — R0600 Dyspnea, unspecified: Secondary | ICD-10-CM | POA: Diagnosis not present

## 2017-11-08 DIAGNOSIS — E785 Hyperlipidemia, unspecified: Secondary | ICD-10-CM | POA: Diagnosis not present

## 2017-11-08 DIAGNOSIS — G4733 Obstructive sleep apnea (adult) (pediatric): Secondary | ICD-10-CM | POA: Diagnosis not present

## 2017-11-08 DIAGNOSIS — I25119 Atherosclerotic heart disease of native coronary artery with unspecified angina pectoris: Secondary | ICD-10-CM | POA: Diagnosis not present

## 2017-11-08 DIAGNOSIS — K219 Gastro-esophageal reflux disease without esophagitis: Secondary | ICD-10-CM | POA: Diagnosis not present

## 2017-11-08 DIAGNOSIS — G619 Inflammatory polyneuropathy, unspecified: Secondary | ICD-10-CM | POA: Diagnosis not present

## 2017-11-08 DIAGNOSIS — I509 Heart failure, unspecified: Secondary | ICD-10-CM | POA: Diagnosis not present

## 2017-11-08 DIAGNOSIS — I10 Essential (primary) hypertension: Secondary | ICD-10-CM | POA: Diagnosis not present

## 2017-11-08 DIAGNOSIS — I4891 Unspecified atrial fibrillation: Secondary | ICD-10-CM | POA: Diagnosis not present

## 2017-11-08 DIAGNOSIS — I2699 Other pulmonary embolism without acute cor pulmonale: Secondary | ICD-10-CM | POA: Diagnosis not present

## 2017-11-09 DIAGNOSIS — I2699 Other pulmonary embolism without acute cor pulmonale: Secondary | ICD-10-CM | POA: Diagnosis not present

## 2017-11-09 DIAGNOSIS — R0600 Dyspnea, unspecified: Secondary | ICD-10-CM | POA: Diagnosis not present

## 2017-11-09 DIAGNOSIS — G619 Inflammatory polyneuropathy, unspecified: Secondary | ICD-10-CM | POA: Diagnosis not present

## 2017-11-09 DIAGNOSIS — G4733 Obstructive sleep apnea (adult) (pediatric): Secondary | ICD-10-CM | POA: Diagnosis not present

## 2017-11-09 DIAGNOSIS — E785 Hyperlipidemia, unspecified: Secondary | ICD-10-CM | POA: Diagnosis not present

## 2017-11-09 DIAGNOSIS — I509 Heart failure, unspecified: Secondary | ICD-10-CM | POA: Diagnosis not present

## 2017-11-09 DIAGNOSIS — I25119 Atherosclerotic heart disease of native coronary artery with unspecified angina pectoris: Secondary | ICD-10-CM | POA: Diagnosis not present

## 2017-11-09 DIAGNOSIS — K219 Gastro-esophageal reflux disease without esophagitis: Secondary | ICD-10-CM | POA: Diagnosis not present

## 2017-11-09 DIAGNOSIS — I10 Essential (primary) hypertension: Secondary | ICD-10-CM | POA: Diagnosis not present

## 2017-11-09 DIAGNOSIS — I4891 Unspecified atrial fibrillation: Secondary | ICD-10-CM | POA: Diagnosis not present

## 2017-11-10 DIAGNOSIS — R0600 Dyspnea, unspecified: Secondary | ICD-10-CM | POA: Diagnosis not present

## 2017-11-10 DIAGNOSIS — G4733 Obstructive sleep apnea (adult) (pediatric): Secondary | ICD-10-CM | POA: Diagnosis not present

## 2017-11-10 DIAGNOSIS — G619 Inflammatory polyneuropathy, unspecified: Secondary | ICD-10-CM | POA: Diagnosis not present

## 2017-11-10 DIAGNOSIS — K219 Gastro-esophageal reflux disease without esophagitis: Secondary | ICD-10-CM | POA: Diagnosis not present

## 2017-11-10 DIAGNOSIS — E785 Hyperlipidemia, unspecified: Secondary | ICD-10-CM | POA: Diagnosis not present

## 2017-11-10 DIAGNOSIS — I25119 Atherosclerotic heart disease of native coronary artery with unspecified angina pectoris: Secondary | ICD-10-CM | POA: Diagnosis not present

## 2017-11-10 DIAGNOSIS — I2699 Other pulmonary embolism without acute cor pulmonale: Secondary | ICD-10-CM | POA: Diagnosis not present

## 2017-11-10 DIAGNOSIS — I509 Heart failure, unspecified: Secondary | ICD-10-CM | POA: Diagnosis not present

## 2017-11-10 DIAGNOSIS — I10 Essential (primary) hypertension: Secondary | ICD-10-CM | POA: Diagnosis not present

## 2017-11-10 DIAGNOSIS — I4891 Unspecified atrial fibrillation: Secondary | ICD-10-CM | POA: Diagnosis not present

## 2017-11-11 DIAGNOSIS — G4733 Obstructive sleep apnea (adult) (pediatric): Secondary | ICD-10-CM | POA: Diagnosis not present

## 2017-11-11 DIAGNOSIS — I10 Essential (primary) hypertension: Secondary | ICD-10-CM | POA: Diagnosis not present

## 2017-11-11 DIAGNOSIS — I2699 Other pulmonary embolism without acute cor pulmonale: Secondary | ICD-10-CM | POA: Diagnosis not present

## 2017-11-11 DIAGNOSIS — E785 Hyperlipidemia, unspecified: Secondary | ICD-10-CM | POA: Diagnosis not present

## 2017-11-11 DIAGNOSIS — G619 Inflammatory polyneuropathy, unspecified: Secondary | ICD-10-CM | POA: Diagnosis not present

## 2017-11-11 DIAGNOSIS — I509 Heart failure, unspecified: Secondary | ICD-10-CM | POA: Diagnosis not present

## 2017-11-11 DIAGNOSIS — K219 Gastro-esophageal reflux disease without esophagitis: Secondary | ICD-10-CM | POA: Diagnosis not present

## 2017-11-11 DIAGNOSIS — I4891 Unspecified atrial fibrillation: Secondary | ICD-10-CM | POA: Diagnosis not present

## 2017-11-11 DIAGNOSIS — R0600 Dyspnea, unspecified: Secondary | ICD-10-CM | POA: Diagnosis not present

## 2017-11-11 DIAGNOSIS — I25119 Atherosclerotic heart disease of native coronary artery with unspecified angina pectoris: Secondary | ICD-10-CM | POA: Diagnosis not present

## 2017-11-12 DIAGNOSIS — I4891 Unspecified atrial fibrillation: Secondary | ICD-10-CM | POA: Diagnosis not present

## 2017-11-12 DIAGNOSIS — I2699 Other pulmonary embolism without acute cor pulmonale: Secondary | ICD-10-CM | POA: Diagnosis not present

## 2017-11-12 DIAGNOSIS — I509 Heart failure, unspecified: Secondary | ICD-10-CM | POA: Diagnosis not present

## 2017-11-12 DIAGNOSIS — K219 Gastro-esophageal reflux disease without esophagitis: Secondary | ICD-10-CM | POA: Diagnosis not present

## 2017-11-12 DIAGNOSIS — E785 Hyperlipidemia, unspecified: Secondary | ICD-10-CM | POA: Diagnosis not present

## 2017-11-12 DIAGNOSIS — R0600 Dyspnea, unspecified: Secondary | ICD-10-CM | POA: Diagnosis not present

## 2017-11-12 DIAGNOSIS — I25119 Atherosclerotic heart disease of native coronary artery with unspecified angina pectoris: Secondary | ICD-10-CM | POA: Diagnosis not present

## 2017-11-12 DIAGNOSIS — G4733 Obstructive sleep apnea (adult) (pediatric): Secondary | ICD-10-CM | POA: Diagnosis not present

## 2017-11-12 DIAGNOSIS — G619 Inflammatory polyneuropathy, unspecified: Secondary | ICD-10-CM | POA: Diagnosis not present

## 2017-11-12 DIAGNOSIS — I10 Essential (primary) hypertension: Secondary | ICD-10-CM | POA: Diagnosis not present

## 2017-11-13 DIAGNOSIS — R0600 Dyspnea, unspecified: Secondary | ICD-10-CM | POA: Diagnosis not present

## 2017-11-13 DIAGNOSIS — K219 Gastro-esophageal reflux disease without esophagitis: Secondary | ICD-10-CM | POA: Diagnosis not present

## 2017-11-13 DIAGNOSIS — I509 Heart failure, unspecified: Secondary | ICD-10-CM | POA: Diagnosis not present

## 2017-11-13 DIAGNOSIS — G4733 Obstructive sleep apnea (adult) (pediatric): Secondary | ICD-10-CM | POA: Diagnosis not present

## 2017-11-13 DIAGNOSIS — G619 Inflammatory polyneuropathy, unspecified: Secondary | ICD-10-CM | POA: Diagnosis not present

## 2017-11-13 DIAGNOSIS — I25119 Atherosclerotic heart disease of native coronary artery with unspecified angina pectoris: Secondary | ICD-10-CM | POA: Diagnosis not present

## 2017-11-13 DIAGNOSIS — E785 Hyperlipidemia, unspecified: Secondary | ICD-10-CM | POA: Diagnosis not present

## 2017-11-13 DIAGNOSIS — I2699 Other pulmonary embolism without acute cor pulmonale: Secondary | ICD-10-CM | POA: Diagnosis not present

## 2017-11-13 DIAGNOSIS — I10 Essential (primary) hypertension: Secondary | ICD-10-CM | POA: Diagnosis not present

## 2017-11-13 DIAGNOSIS — I4891 Unspecified atrial fibrillation: Secondary | ICD-10-CM | POA: Diagnosis not present

## 2017-11-14 DIAGNOSIS — I2699 Other pulmonary embolism without acute cor pulmonale: Secondary | ICD-10-CM | POA: Diagnosis not present

## 2017-11-14 DIAGNOSIS — G4733 Obstructive sleep apnea (adult) (pediatric): Secondary | ICD-10-CM | POA: Diagnosis not present

## 2017-11-14 DIAGNOSIS — K219 Gastro-esophageal reflux disease without esophagitis: Secondary | ICD-10-CM | POA: Diagnosis not present

## 2017-11-14 DIAGNOSIS — I10 Essential (primary) hypertension: Secondary | ICD-10-CM | POA: Diagnosis not present

## 2017-11-14 DIAGNOSIS — R0600 Dyspnea, unspecified: Secondary | ICD-10-CM | POA: Diagnosis not present

## 2017-11-14 DIAGNOSIS — I25119 Atherosclerotic heart disease of native coronary artery with unspecified angina pectoris: Secondary | ICD-10-CM | POA: Diagnosis not present

## 2017-11-14 DIAGNOSIS — I4891 Unspecified atrial fibrillation: Secondary | ICD-10-CM | POA: Diagnosis not present

## 2017-11-14 DIAGNOSIS — G619 Inflammatory polyneuropathy, unspecified: Secondary | ICD-10-CM | POA: Diagnosis not present

## 2017-11-14 DIAGNOSIS — I509 Heart failure, unspecified: Secondary | ICD-10-CM | POA: Diagnosis not present

## 2017-11-14 DIAGNOSIS — E785 Hyperlipidemia, unspecified: Secondary | ICD-10-CM | POA: Diagnosis not present

## 2017-11-15 DIAGNOSIS — G4733 Obstructive sleep apnea (adult) (pediatric): Secondary | ICD-10-CM | POA: Diagnosis not present

## 2017-11-15 DIAGNOSIS — I509 Heart failure, unspecified: Secondary | ICD-10-CM | POA: Diagnosis not present

## 2017-11-15 DIAGNOSIS — G619 Inflammatory polyneuropathy, unspecified: Secondary | ICD-10-CM | POA: Diagnosis not present

## 2017-11-15 DIAGNOSIS — I25119 Atherosclerotic heart disease of native coronary artery with unspecified angina pectoris: Secondary | ICD-10-CM | POA: Diagnosis not present

## 2017-11-15 DIAGNOSIS — K219 Gastro-esophageal reflux disease without esophagitis: Secondary | ICD-10-CM | POA: Diagnosis not present

## 2017-11-15 DIAGNOSIS — R0600 Dyspnea, unspecified: Secondary | ICD-10-CM | POA: Diagnosis not present

## 2017-11-15 DIAGNOSIS — I10 Essential (primary) hypertension: Secondary | ICD-10-CM | POA: Diagnosis not present

## 2017-11-15 DIAGNOSIS — I4891 Unspecified atrial fibrillation: Secondary | ICD-10-CM | POA: Diagnosis not present

## 2017-11-15 DIAGNOSIS — I2699 Other pulmonary embolism without acute cor pulmonale: Secondary | ICD-10-CM | POA: Diagnosis not present

## 2017-11-15 DIAGNOSIS — E785 Hyperlipidemia, unspecified: Secondary | ICD-10-CM | POA: Diagnosis not present

## 2017-11-16 DIAGNOSIS — I509 Heart failure, unspecified: Secondary | ICD-10-CM | POA: Diagnosis not present

## 2017-11-16 DIAGNOSIS — I4891 Unspecified atrial fibrillation: Secondary | ICD-10-CM | POA: Diagnosis not present

## 2017-11-16 DIAGNOSIS — I10 Essential (primary) hypertension: Secondary | ICD-10-CM | POA: Diagnosis not present

## 2017-11-16 DIAGNOSIS — G4733 Obstructive sleep apnea (adult) (pediatric): Secondary | ICD-10-CM | POA: Diagnosis not present

## 2017-11-16 DIAGNOSIS — I2699 Other pulmonary embolism without acute cor pulmonale: Secondary | ICD-10-CM | POA: Diagnosis not present

## 2017-11-16 DIAGNOSIS — I25119 Atherosclerotic heart disease of native coronary artery with unspecified angina pectoris: Secondary | ICD-10-CM | POA: Diagnosis not present

## 2017-11-16 DIAGNOSIS — G619 Inflammatory polyneuropathy, unspecified: Secondary | ICD-10-CM | POA: Diagnosis not present

## 2017-11-16 DIAGNOSIS — R0600 Dyspnea, unspecified: Secondary | ICD-10-CM | POA: Diagnosis not present

## 2017-11-16 DIAGNOSIS — K219 Gastro-esophageal reflux disease without esophagitis: Secondary | ICD-10-CM | POA: Diagnosis not present

## 2017-11-16 DIAGNOSIS — E785 Hyperlipidemia, unspecified: Secondary | ICD-10-CM | POA: Diagnosis not present

## 2017-11-17 DIAGNOSIS — I4891 Unspecified atrial fibrillation: Secondary | ICD-10-CM | POA: Diagnosis not present

## 2017-11-17 DIAGNOSIS — I25119 Atherosclerotic heart disease of native coronary artery with unspecified angina pectoris: Secondary | ICD-10-CM | POA: Diagnosis not present

## 2017-11-17 DIAGNOSIS — I10 Essential (primary) hypertension: Secondary | ICD-10-CM | POA: Diagnosis not present

## 2017-11-17 DIAGNOSIS — I509 Heart failure, unspecified: Secondary | ICD-10-CM | POA: Diagnosis not present

## 2017-11-17 DIAGNOSIS — R0600 Dyspnea, unspecified: Secondary | ICD-10-CM | POA: Diagnosis not present

## 2017-11-17 DIAGNOSIS — E785 Hyperlipidemia, unspecified: Secondary | ICD-10-CM | POA: Diagnosis not present

## 2017-11-17 DIAGNOSIS — G4733 Obstructive sleep apnea (adult) (pediatric): Secondary | ICD-10-CM | POA: Diagnosis not present

## 2017-11-17 DIAGNOSIS — K219 Gastro-esophageal reflux disease without esophagitis: Secondary | ICD-10-CM | POA: Diagnosis not present

## 2017-11-17 DIAGNOSIS — I2699 Other pulmonary embolism without acute cor pulmonale: Secondary | ICD-10-CM | POA: Diagnosis not present

## 2017-11-17 DIAGNOSIS — G619 Inflammatory polyneuropathy, unspecified: Secondary | ICD-10-CM | POA: Diagnosis not present

## 2017-11-18 DIAGNOSIS — I509 Heart failure, unspecified: Secondary | ICD-10-CM | POA: Diagnosis not present

## 2017-11-18 DIAGNOSIS — E785 Hyperlipidemia, unspecified: Secondary | ICD-10-CM | POA: Diagnosis not present

## 2017-11-18 DIAGNOSIS — K219 Gastro-esophageal reflux disease without esophagitis: Secondary | ICD-10-CM | POA: Diagnosis not present

## 2017-11-18 DIAGNOSIS — G4733 Obstructive sleep apnea (adult) (pediatric): Secondary | ICD-10-CM | POA: Diagnosis not present

## 2017-11-18 DIAGNOSIS — I10 Essential (primary) hypertension: Secondary | ICD-10-CM | POA: Diagnosis not present

## 2017-11-18 DIAGNOSIS — I25119 Atherosclerotic heart disease of native coronary artery with unspecified angina pectoris: Secondary | ICD-10-CM | POA: Diagnosis not present

## 2017-11-18 DIAGNOSIS — I4891 Unspecified atrial fibrillation: Secondary | ICD-10-CM | POA: Diagnosis not present

## 2017-11-18 DIAGNOSIS — I2699 Other pulmonary embolism without acute cor pulmonale: Secondary | ICD-10-CM | POA: Diagnosis not present

## 2017-11-18 DIAGNOSIS — G619 Inflammatory polyneuropathy, unspecified: Secondary | ICD-10-CM | POA: Diagnosis not present

## 2017-11-18 DIAGNOSIS — R0600 Dyspnea, unspecified: Secondary | ICD-10-CM | POA: Diagnosis not present

## 2017-11-19 DIAGNOSIS — I509 Heart failure, unspecified: Secondary | ICD-10-CM | POA: Diagnosis not present

## 2017-11-19 DIAGNOSIS — I4891 Unspecified atrial fibrillation: Secondary | ICD-10-CM | POA: Diagnosis not present

## 2017-11-19 DIAGNOSIS — I10 Essential (primary) hypertension: Secondary | ICD-10-CM | POA: Diagnosis not present

## 2017-11-19 DIAGNOSIS — I25119 Atherosclerotic heart disease of native coronary artery with unspecified angina pectoris: Secondary | ICD-10-CM | POA: Diagnosis not present

## 2017-11-19 DIAGNOSIS — G4733 Obstructive sleep apnea (adult) (pediatric): Secondary | ICD-10-CM | POA: Diagnosis not present

## 2017-11-19 DIAGNOSIS — R0600 Dyspnea, unspecified: Secondary | ICD-10-CM | POA: Diagnosis not present

## 2017-11-19 DIAGNOSIS — G619 Inflammatory polyneuropathy, unspecified: Secondary | ICD-10-CM | POA: Diagnosis not present

## 2017-11-19 DIAGNOSIS — I2699 Other pulmonary embolism without acute cor pulmonale: Secondary | ICD-10-CM | POA: Diagnosis not present

## 2017-11-19 DIAGNOSIS — E785 Hyperlipidemia, unspecified: Secondary | ICD-10-CM | POA: Diagnosis not present

## 2017-11-19 DIAGNOSIS — K219 Gastro-esophageal reflux disease without esophagitis: Secondary | ICD-10-CM | POA: Diagnosis not present

## 2017-11-20 DIAGNOSIS — I10 Essential (primary) hypertension: Secondary | ICD-10-CM | POA: Diagnosis not present

## 2017-11-20 DIAGNOSIS — I509 Heart failure, unspecified: Secondary | ICD-10-CM | POA: Diagnosis not present

## 2017-11-20 DIAGNOSIS — R0600 Dyspnea, unspecified: Secondary | ICD-10-CM | POA: Diagnosis not present

## 2017-11-20 DIAGNOSIS — G619 Inflammatory polyneuropathy, unspecified: Secondary | ICD-10-CM | POA: Diagnosis not present

## 2017-11-20 DIAGNOSIS — I2699 Other pulmonary embolism without acute cor pulmonale: Secondary | ICD-10-CM | POA: Diagnosis not present

## 2017-11-20 DIAGNOSIS — I25119 Atherosclerotic heart disease of native coronary artery with unspecified angina pectoris: Secondary | ICD-10-CM | POA: Diagnosis not present

## 2017-11-20 DIAGNOSIS — G4733 Obstructive sleep apnea (adult) (pediatric): Secondary | ICD-10-CM | POA: Diagnosis not present

## 2017-11-20 DIAGNOSIS — I4891 Unspecified atrial fibrillation: Secondary | ICD-10-CM | POA: Diagnosis not present

## 2017-11-20 DIAGNOSIS — E785 Hyperlipidemia, unspecified: Secondary | ICD-10-CM | POA: Diagnosis not present

## 2017-11-20 DIAGNOSIS — K219 Gastro-esophageal reflux disease without esophagitis: Secondary | ICD-10-CM | POA: Diagnosis not present

## 2017-11-21 DIAGNOSIS — I2699 Other pulmonary embolism without acute cor pulmonale: Secondary | ICD-10-CM | POA: Diagnosis not present

## 2017-11-21 DIAGNOSIS — I4891 Unspecified atrial fibrillation: Secondary | ICD-10-CM | POA: Diagnosis not present

## 2017-11-21 DIAGNOSIS — K219 Gastro-esophageal reflux disease without esophagitis: Secondary | ICD-10-CM | POA: Diagnosis not present

## 2017-11-21 DIAGNOSIS — I509 Heart failure, unspecified: Secondary | ICD-10-CM | POA: Diagnosis not present

## 2017-11-21 DIAGNOSIS — G619 Inflammatory polyneuropathy, unspecified: Secondary | ICD-10-CM | POA: Diagnosis not present

## 2017-11-21 DIAGNOSIS — G4733 Obstructive sleep apnea (adult) (pediatric): Secondary | ICD-10-CM | POA: Diagnosis not present

## 2017-11-21 DIAGNOSIS — I10 Essential (primary) hypertension: Secondary | ICD-10-CM | POA: Diagnosis not present

## 2017-11-21 DIAGNOSIS — E785 Hyperlipidemia, unspecified: Secondary | ICD-10-CM | POA: Diagnosis not present

## 2017-11-21 DIAGNOSIS — R0600 Dyspnea, unspecified: Secondary | ICD-10-CM | POA: Diagnosis not present

## 2017-11-21 DIAGNOSIS — I25119 Atherosclerotic heart disease of native coronary artery with unspecified angina pectoris: Secondary | ICD-10-CM | POA: Diagnosis not present

## 2017-11-22 DIAGNOSIS — I2699 Other pulmonary embolism without acute cor pulmonale: Secondary | ICD-10-CM | POA: Diagnosis not present

## 2017-11-22 DIAGNOSIS — I10 Essential (primary) hypertension: Secondary | ICD-10-CM | POA: Diagnosis not present

## 2017-11-22 DIAGNOSIS — I509 Heart failure, unspecified: Secondary | ICD-10-CM | POA: Diagnosis not present

## 2017-11-22 DIAGNOSIS — E785 Hyperlipidemia, unspecified: Secondary | ICD-10-CM | POA: Diagnosis not present

## 2017-11-22 DIAGNOSIS — I4891 Unspecified atrial fibrillation: Secondary | ICD-10-CM | POA: Diagnosis not present

## 2017-11-22 DIAGNOSIS — G4733 Obstructive sleep apnea (adult) (pediatric): Secondary | ICD-10-CM | POA: Diagnosis not present

## 2017-11-22 DIAGNOSIS — R0600 Dyspnea, unspecified: Secondary | ICD-10-CM | POA: Diagnosis not present

## 2017-11-22 DIAGNOSIS — G619 Inflammatory polyneuropathy, unspecified: Secondary | ICD-10-CM | POA: Diagnosis not present

## 2017-11-22 DIAGNOSIS — I25119 Atherosclerotic heart disease of native coronary artery with unspecified angina pectoris: Secondary | ICD-10-CM | POA: Diagnosis not present

## 2017-11-22 DIAGNOSIS — K219 Gastro-esophageal reflux disease without esophagitis: Secondary | ICD-10-CM | POA: Diagnosis not present

## 2017-11-23 DIAGNOSIS — K219 Gastro-esophageal reflux disease without esophagitis: Secondary | ICD-10-CM | POA: Diagnosis not present

## 2017-11-23 DIAGNOSIS — G4733 Obstructive sleep apnea (adult) (pediatric): Secondary | ICD-10-CM | POA: Diagnosis not present

## 2017-11-23 DIAGNOSIS — E785 Hyperlipidemia, unspecified: Secondary | ICD-10-CM | POA: Diagnosis not present

## 2017-11-23 DIAGNOSIS — R0600 Dyspnea, unspecified: Secondary | ICD-10-CM | POA: Diagnosis not present

## 2017-11-23 DIAGNOSIS — I2699 Other pulmonary embolism without acute cor pulmonale: Secondary | ICD-10-CM | POA: Diagnosis not present

## 2017-11-23 DIAGNOSIS — I509 Heart failure, unspecified: Secondary | ICD-10-CM | POA: Diagnosis not present

## 2017-11-23 DIAGNOSIS — I10 Essential (primary) hypertension: Secondary | ICD-10-CM | POA: Diagnosis not present

## 2017-11-23 DIAGNOSIS — I4891 Unspecified atrial fibrillation: Secondary | ICD-10-CM | POA: Diagnosis not present

## 2017-11-23 DIAGNOSIS — I25119 Atherosclerotic heart disease of native coronary artery with unspecified angina pectoris: Secondary | ICD-10-CM | POA: Diagnosis not present

## 2017-11-23 DIAGNOSIS — G619 Inflammatory polyneuropathy, unspecified: Secondary | ICD-10-CM | POA: Diagnosis not present

## 2017-11-24 DIAGNOSIS — E785 Hyperlipidemia, unspecified: Secondary | ICD-10-CM | POA: Diagnosis not present

## 2017-11-24 DIAGNOSIS — I10 Essential (primary) hypertension: Secondary | ICD-10-CM | POA: Diagnosis not present

## 2017-11-24 DIAGNOSIS — G4733 Obstructive sleep apnea (adult) (pediatric): Secondary | ICD-10-CM | POA: Diagnosis not present

## 2017-11-24 DIAGNOSIS — I2699 Other pulmonary embolism without acute cor pulmonale: Secondary | ICD-10-CM | POA: Diagnosis not present

## 2017-11-24 DIAGNOSIS — R0600 Dyspnea, unspecified: Secondary | ICD-10-CM | POA: Diagnosis not present

## 2017-11-24 DIAGNOSIS — I509 Heart failure, unspecified: Secondary | ICD-10-CM | POA: Diagnosis not present

## 2017-11-24 DIAGNOSIS — K219 Gastro-esophageal reflux disease without esophagitis: Secondary | ICD-10-CM | POA: Diagnosis not present

## 2017-11-24 DIAGNOSIS — G619 Inflammatory polyneuropathy, unspecified: Secondary | ICD-10-CM | POA: Diagnosis not present

## 2017-11-24 DIAGNOSIS — I25119 Atherosclerotic heart disease of native coronary artery with unspecified angina pectoris: Secondary | ICD-10-CM | POA: Diagnosis not present

## 2017-11-24 DIAGNOSIS — I4891 Unspecified atrial fibrillation: Secondary | ICD-10-CM | POA: Diagnosis not present

## 2017-11-25 DIAGNOSIS — R0600 Dyspnea, unspecified: Secondary | ICD-10-CM | POA: Diagnosis not present

## 2017-11-25 DIAGNOSIS — I509 Heart failure, unspecified: Secondary | ICD-10-CM | POA: Diagnosis not present

## 2017-11-25 DIAGNOSIS — I25119 Atherosclerotic heart disease of native coronary artery with unspecified angina pectoris: Secondary | ICD-10-CM | POA: Diagnosis not present

## 2017-11-25 DIAGNOSIS — I10 Essential (primary) hypertension: Secondary | ICD-10-CM | POA: Diagnosis not present

## 2017-11-25 DIAGNOSIS — I4891 Unspecified atrial fibrillation: Secondary | ICD-10-CM | POA: Diagnosis not present

## 2017-11-25 DIAGNOSIS — G4733 Obstructive sleep apnea (adult) (pediatric): Secondary | ICD-10-CM | POA: Diagnosis not present

## 2017-11-25 DIAGNOSIS — K219 Gastro-esophageal reflux disease without esophagitis: Secondary | ICD-10-CM | POA: Diagnosis not present

## 2017-11-25 DIAGNOSIS — I2699 Other pulmonary embolism without acute cor pulmonale: Secondary | ICD-10-CM | POA: Diagnosis not present

## 2017-11-25 DIAGNOSIS — G619 Inflammatory polyneuropathy, unspecified: Secondary | ICD-10-CM | POA: Diagnosis not present

## 2017-11-25 DIAGNOSIS — E785 Hyperlipidemia, unspecified: Secondary | ICD-10-CM | POA: Diagnosis not present

## 2017-11-26 DIAGNOSIS — G619 Inflammatory polyneuropathy, unspecified: Secondary | ICD-10-CM | POA: Diagnosis not present

## 2017-11-26 DIAGNOSIS — K219 Gastro-esophageal reflux disease without esophagitis: Secondary | ICD-10-CM | POA: Diagnosis not present

## 2017-11-26 DIAGNOSIS — I2699 Other pulmonary embolism without acute cor pulmonale: Secondary | ICD-10-CM | POA: Diagnosis not present

## 2017-11-26 DIAGNOSIS — G4733 Obstructive sleep apnea (adult) (pediatric): Secondary | ICD-10-CM | POA: Diagnosis not present

## 2017-11-26 DIAGNOSIS — I25119 Atherosclerotic heart disease of native coronary artery with unspecified angina pectoris: Secondary | ICD-10-CM | POA: Diagnosis not present

## 2017-11-26 DIAGNOSIS — I4891 Unspecified atrial fibrillation: Secondary | ICD-10-CM | POA: Diagnosis not present

## 2017-11-26 DIAGNOSIS — E785 Hyperlipidemia, unspecified: Secondary | ICD-10-CM | POA: Diagnosis not present

## 2017-11-26 DIAGNOSIS — I10 Essential (primary) hypertension: Secondary | ICD-10-CM | POA: Diagnosis not present

## 2017-11-26 DIAGNOSIS — I509 Heart failure, unspecified: Secondary | ICD-10-CM | POA: Diagnosis not present

## 2017-11-26 DIAGNOSIS — R0600 Dyspnea, unspecified: Secondary | ICD-10-CM | POA: Diagnosis not present

## 2017-11-27 DIAGNOSIS — R0600 Dyspnea, unspecified: Secondary | ICD-10-CM | POA: Diagnosis not present

## 2017-11-27 DIAGNOSIS — I4891 Unspecified atrial fibrillation: Secondary | ICD-10-CM | POA: Diagnosis not present

## 2017-11-27 DIAGNOSIS — I25119 Atherosclerotic heart disease of native coronary artery with unspecified angina pectoris: Secondary | ICD-10-CM | POA: Diagnosis not present

## 2017-11-27 DIAGNOSIS — I10 Essential (primary) hypertension: Secondary | ICD-10-CM | POA: Diagnosis not present

## 2017-11-27 DIAGNOSIS — G4733 Obstructive sleep apnea (adult) (pediatric): Secondary | ICD-10-CM | POA: Diagnosis not present

## 2017-11-27 DIAGNOSIS — I509 Heart failure, unspecified: Secondary | ICD-10-CM | POA: Diagnosis not present

## 2017-11-27 DIAGNOSIS — G619 Inflammatory polyneuropathy, unspecified: Secondary | ICD-10-CM | POA: Diagnosis not present

## 2017-11-27 DIAGNOSIS — K219 Gastro-esophageal reflux disease without esophagitis: Secondary | ICD-10-CM | POA: Diagnosis not present

## 2017-11-27 DIAGNOSIS — I2699 Other pulmonary embolism without acute cor pulmonale: Secondary | ICD-10-CM | POA: Diagnosis not present

## 2017-11-27 DIAGNOSIS — E785 Hyperlipidemia, unspecified: Secondary | ICD-10-CM | POA: Diagnosis not present

## 2017-11-28 DIAGNOSIS — E785 Hyperlipidemia, unspecified: Secondary | ICD-10-CM | POA: Diagnosis not present

## 2017-11-28 DIAGNOSIS — I509 Heart failure, unspecified: Secondary | ICD-10-CM | POA: Diagnosis not present

## 2017-11-28 DIAGNOSIS — I4891 Unspecified atrial fibrillation: Secondary | ICD-10-CM | POA: Diagnosis not present

## 2017-11-28 DIAGNOSIS — I2699 Other pulmonary embolism without acute cor pulmonale: Secondary | ICD-10-CM | POA: Diagnosis not present

## 2017-11-28 DIAGNOSIS — I10 Essential (primary) hypertension: Secondary | ICD-10-CM | POA: Diagnosis not present

## 2017-11-28 DIAGNOSIS — G619 Inflammatory polyneuropathy, unspecified: Secondary | ICD-10-CM | POA: Diagnosis not present

## 2017-11-28 DIAGNOSIS — R0600 Dyspnea, unspecified: Secondary | ICD-10-CM | POA: Diagnosis not present

## 2017-11-28 DIAGNOSIS — G4733 Obstructive sleep apnea (adult) (pediatric): Secondary | ICD-10-CM | POA: Diagnosis not present

## 2017-11-28 DIAGNOSIS — K219 Gastro-esophageal reflux disease without esophagitis: Secondary | ICD-10-CM | POA: Diagnosis not present

## 2017-11-28 DIAGNOSIS — I25119 Atherosclerotic heart disease of native coronary artery with unspecified angina pectoris: Secondary | ICD-10-CM | POA: Diagnosis not present

## 2017-11-29 DIAGNOSIS — E785 Hyperlipidemia, unspecified: Secondary | ICD-10-CM | POA: Diagnosis not present

## 2017-11-29 DIAGNOSIS — G619 Inflammatory polyneuropathy, unspecified: Secondary | ICD-10-CM | POA: Diagnosis not present

## 2017-11-29 DIAGNOSIS — I2699 Other pulmonary embolism without acute cor pulmonale: Secondary | ICD-10-CM | POA: Diagnosis not present

## 2017-11-29 DIAGNOSIS — K219 Gastro-esophageal reflux disease without esophagitis: Secondary | ICD-10-CM | POA: Diagnosis not present

## 2017-11-29 DIAGNOSIS — R0600 Dyspnea, unspecified: Secondary | ICD-10-CM | POA: Diagnosis not present

## 2017-11-29 DIAGNOSIS — G4733 Obstructive sleep apnea (adult) (pediatric): Secondary | ICD-10-CM | POA: Diagnosis not present

## 2017-11-29 DIAGNOSIS — I4891 Unspecified atrial fibrillation: Secondary | ICD-10-CM | POA: Diagnosis not present

## 2017-11-29 DIAGNOSIS — I509 Heart failure, unspecified: Secondary | ICD-10-CM | POA: Diagnosis not present

## 2017-11-29 DIAGNOSIS — I10 Essential (primary) hypertension: Secondary | ICD-10-CM | POA: Diagnosis not present

## 2017-11-29 DIAGNOSIS — I25119 Atherosclerotic heart disease of native coronary artery with unspecified angina pectoris: Secondary | ICD-10-CM | POA: Diagnosis not present

## 2017-11-30 DIAGNOSIS — I10 Essential (primary) hypertension: Secondary | ICD-10-CM | POA: Diagnosis not present

## 2017-11-30 DIAGNOSIS — I2699 Other pulmonary embolism without acute cor pulmonale: Secondary | ICD-10-CM | POA: Diagnosis not present

## 2017-11-30 DIAGNOSIS — G619 Inflammatory polyneuropathy, unspecified: Secondary | ICD-10-CM | POA: Diagnosis not present

## 2017-11-30 DIAGNOSIS — G4733 Obstructive sleep apnea (adult) (pediatric): Secondary | ICD-10-CM | POA: Diagnosis not present

## 2017-11-30 DIAGNOSIS — I509 Heart failure, unspecified: Secondary | ICD-10-CM | POA: Diagnosis not present

## 2017-11-30 DIAGNOSIS — I25119 Atherosclerotic heart disease of native coronary artery with unspecified angina pectoris: Secondary | ICD-10-CM | POA: Diagnosis not present

## 2017-11-30 DIAGNOSIS — R0600 Dyspnea, unspecified: Secondary | ICD-10-CM | POA: Diagnosis not present

## 2017-11-30 DIAGNOSIS — I4891 Unspecified atrial fibrillation: Secondary | ICD-10-CM | POA: Diagnosis not present

## 2017-11-30 DIAGNOSIS — E785 Hyperlipidemia, unspecified: Secondary | ICD-10-CM | POA: Diagnosis not present

## 2017-11-30 DIAGNOSIS — K219 Gastro-esophageal reflux disease without esophagitis: Secondary | ICD-10-CM | POA: Diagnosis not present

## 2017-12-01 DIAGNOSIS — G4733 Obstructive sleep apnea (adult) (pediatric): Secondary | ICD-10-CM | POA: Diagnosis not present

## 2017-12-01 DIAGNOSIS — K219 Gastro-esophageal reflux disease without esophagitis: Secondary | ICD-10-CM | POA: Diagnosis not present

## 2017-12-01 DIAGNOSIS — E785 Hyperlipidemia, unspecified: Secondary | ICD-10-CM | POA: Diagnosis not present

## 2017-12-01 DIAGNOSIS — I4891 Unspecified atrial fibrillation: Secondary | ICD-10-CM | POA: Diagnosis not present

## 2017-12-01 DIAGNOSIS — I2699 Other pulmonary embolism without acute cor pulmonale: Secondary | ICD-10-CM | POA: Diagnosis not present

## 2017-12-01 DIAGNOSIS — I509 Heart failure, unspecified: Secondary | ICD-10-CM | POA: Diagnosis not present

## 2017-12-01 DIAGNOSIS — I10 Essential (primary) hypertension: Secondary | ICD-10-CM | POA: Diagnosis not present

## 2017-12-01 DIAGNOSIS — G619 Inflammatory polyneuropathy, unspecified: Secondary | ICD-10-CM | POA: Diagnosis not present

## 2017-12-01 DIAGNOSIS — R0600 Dyspnea, unspecified: Secondary | ICD-10-CM | POA: Diagnosis not present

## 2017-12-01 DIAGNOSIS — I25119 Atherosclerotic heart disease of native coronary artery with unspecified angina pectoris: Secondary | ICD-10-CM | POA: Diagnosis not present

## 2017-12-02 DIAGNOSIS — I4891 Unspecified atrial fibrillation: Secondary | ICD-10-CM | POA: Diagnosis not present

## 2017-12-02 DIAGNOSIS — I509 Heart failure, unspecified: Secondary | ICD-10-CM | POA: Diagnosis not present

## 2017-12-02 DIAGNOSIS — G619 Inflammatory polyneuropathy, unspecified: Secondary | ICD-10-CM | POA: Diagnosis not present

## 2017-12-02 DIAGNOSIS — E785 Hyperlipidemia, unspecified: Secondary | ICD-10-CM | POA: Diagnosis not present

## 2017-12-02 DIAGNOSIS — G4733 Obstructive sleep apnea (adult) (pediatric): Secondary | ICD-10-CM | POA: Diagnosis not present

## 2017-12-02 DIAGNOSIS — I10 Essential (primary) hypertension: Secondary | ICD-10-CM | POA: Diagnosis not present

## 2017-12-02 DIAGNOSIS — I2699 Other pulmonary embolism without acute cor pulmonale: Secondary | ICD-10-CM | POA: Diagnosis not present

## 2017-12-02 DIAGNOSIS — R0600 Dyspnea, unspecified: Secondary | ICD-10-CM | POA: Diagnosis not present

## 2017-12-02 DIAGNOSIS — K219 Gastro-esophageal reflux disease without esophagitis: Secondary | ICD-10-CM | POA: Diagnosis not present

## 2017-12-02 DIAGNOSIS — I25119 Atherosclerotic heart disease of native coronary artery with unspecified angina pectoris: Secondary | ICD-10-CM | POA: Diagnosis not present

## 2017-12-03 DIAGNOSIS — K219 Gastro-esophageal reflux disease without esophagitis: Secondary | ICD-10-CM | POA: Diagnosis not present

## 2017-12-03 DIAGNOSIS — I509 Heart failure, unspecified: Secondary | ICD-10-CM | POA: Diagnosis not present

## 2017-12-03 DIAGNOSIS — E785 Hyperlipidemia, unspecified: Secondary | ICD-10-CM | POA: Diagnosis not present

## 2017-12-03 DIAGNOSIS — I4891 Unspecified atrial fibrillation: Secondary | ICD-10-CM | POA: Diagnosis not present

## 2017-12-03 DIAGNOSIS — G619 Inflammatory polyneuropathy, unspecified: Secondary | ICD-10-CM | POA: Diagnosis not present

## 2017-12-03 DIAGNOSIS — R0600 Dyspnea, unspecified: Secondary | ICD-10-CM | POA: Diagnosis not present

## 2017-12-03 DIAGNOSIS — I2699 Other pulmonary embolism without acute cor pulmonale: Secondary | ICD-10-CM | POA: Diagnosis not present

## 2017-12-03 DIAGNOSIS — I10 Essential (primary) hypertension: Secondary | ICD-10-CM | POA: Diagnosis not present

## 2017-12-03 DIAGNOSIS — I25119 Atherosclerotic heart disease of native coronary artery with unspecified angina pectoris: Secondary | ICD-10-CM | POA: Diagnosis not present

## 2017-12-03 DIAGNOSIS — G4733 Obstructive sleep apnea (adult) (pediatric): Secondary | ICD-10-CM | POA: Diagnosis not present

## 2017-12-04 DIAGNOSIS — R0600 Dyspnea, unspecified: Secondary | ICD-10-CM | POA: Diagnosis not present

## 2017-12-04 DIAGNOSIS — I10 Essential (primary) hypertension: Secondary | ICD-10-CM | POA: Diagnosis not present

## 2017-12-04 DIAGNOSIS — E785 Hyperlipidemia, unspecified: Secondary | ICD-10-CM | POA: Diagnosis not present

## 2017-12-04 DIAGNOSIS — G619 Inflammatory polyneuropathy, unspecified: Secondary | ICD-10-CM | POA: Diagnosis not present

## 2017-12-04 DIAGNOSIS — I25119 Atherosclerotic heart disease of native coronary artery with unspecified angina pectoris: Secondary | ICD-10-CM | POA: Diagnosis not present

## 2017-12-04 DIAGNOSIS — I509 Heart failure, unspecified: Secondary | ICD-10-CM | POA: Diagnosis not present

## 2017-12-04 DIAGNOSIS — I2699 Other pulmonary embolism without acute cor pulmonale: Secondary | ICD-10-CM | POA: Diagnosis not present

## 2017-12-04 DIAGNOSIS — G4733 Obstructive sleep apnea (adult) (pediatric): Secondary | ICD-10-CM | POA: Diagnosis not present

## 2017-12-04 DIAGNOSIS — K219 Gastro-esophageal reflux disease without esophagitis: Secondary | ICD-10-CM | POA: Diagnosis not present

## 2017-12-04 DIAGNOSIS — I4891 Unspecified atrial fibrillation: Secondary | ICD-10-CM | POA: Diagnosis not present

## 2017-12-05 DIAGNOSIS — I4891 Unspecified atrial fibrillation: Secondary | ICD-10-CM | POA: Diagnosis not present

## 2017-12-05 DIAGNOSIS — I25119 Atherosclerotic heart disease of native coronary artery with unspecified angina pectoris: Secondary | ICD-10-CM | POA: Diagnosis not present

## 2017-12-05 DIAGNOSIS — I10 Essential (primary) hypertension: Secondary | ICD-10-CM | POA: Diagnosis not present

## 2017-12-05 DIAGNOSIS — R0600 Dyspnea, unspecified: Secondary | ICD-10-CM | POA: Diagnosis not present

## 2017-12-05 DIAGNOSIS — G4733 Obstructive sleep apnea (adult) (pediatric): Secondary | ICD-10-CM | POA: Diagnosis not present

## 2017-12-05 DIAGNOSIS — K219 Gastro-esophageal reflux disease without esophagitis: Secondary | ICD-10-CM | POA: Diagnosis not present

## 2017-12-05 DIAGNOSIS — I509 Heart failure, unspecified: Secondary | ICD-10-CM | POA: Diagnosis not present

## 2017-12-05 DIAGNOSIS — E785 Hyperlipidemia, unspecified: Secondary | ICD-10-CM | POA: Diagnosis not present

## 2017-12-05 DIAGNOSIS — I2699 Other pulmonary embolism without acute cor pulmonale: Secondary | ICD-10-CM | POA: Diagnosis not present

## 2017-12-05 DIAGNOSIS — G619 Inflammatory polyneuropathy, unspecified: Secondary | ICD-10-CM | POA: Diagnosis not present

## 2017-12-06 DIAGNOSIS — I10 Essential (primary) hypertension: Secondary | ICD-10-CM | POA: Diagnosis not present

## 2017-12-06 DIAGNOSIS — K219 Gastro-esophageal reflux disease without esophagitis: Secondary | ICD-10-CM | POA: Diagnosis not present

## 2017-12-06 DIAGNOSIS — G619 Inflammatory polyneuropathy, unspecified: Secondary | ICD-10-CM | POA: Diagnosis not present

## 2017-12-06 DIAGNOSIS — I2699 Other pulmonary embolism without acute cor pulmonale: Secondary | ICD-10-CM | POA: Diagnosis not present

## 2017-12-06 DIAGNOSIS — I4891 Unspecified atrial fibrillation: Secondary | ICD-10-CM | POA: Diagnosis not present

## 2017-12-06 DIAGNOSIS — R0600 Dyspnea, unspecified: Secondary | ICD-10-CM | POA: Diagnosis not present

## 2017-12-06 DIAGNOSIS — I25119 Atherosclerotic heart disease of native coronary artery with unspecified angina pectoris: Secondary | ICD-10-CM | POA: Diagnosis not present

## 2017-12-06 DIAGNOSIS — E785 Hyperlipidemia, unspecified: Secondary | ICD-10-CM | POA: Diagnosis not present

## 2017-12-06 DIAGNOSIS — I509 Heart failure, unspecified: Secondary | ICD-10-CM | POA: Diagnosis not present

## 2017-12-06 DIAGNOSIS — G4733 Obstructive sleep apnea (adult) (pediatric): Secondary | ICD-10-CM | POA: Diagnosis not present

## 2017-12-19 DEATH — deceased
